# Patient Record
Sex: Male | Born: 1940 | Race: White | Hispanic: No | Smoking: Former smoker
Health system: Southern US, Community
[De-identification: ages and names within clinical notes are randomized; demographics above are authoritative.]

## PROBLEM LIST (undated history)

## (undated) DIAGNOSIS — N183 Chronic kidney disease, stage 3 unspecified: Secondary | ICD-10-CM

## (undated) DIAGNOSIS — K219 Gastro-esophageal reflux disease without esophagitis: Secondary | ICD-10-CM

## (undated) DIAGNOSIS — C189 Malignant neoplasm of colon, unspecified: Secondary | ICD-10-CM

## (undated) DIAGNOSIS — M1990 Unspecified osteoarthritis, unspecified site: Secondary | ICD-10-CM

## (undated) DIAGNOSIS — K635 Polyp of colon: Secondary | ICD-10-CM

## (undated) DIAGNOSIS — K259 Gastric ulcer, unspecified as acute or chronic, without hemorrhage or perforation: Secondary | ICD-10-CM

## (undated) DIAGNOSIS — I5022 Chronic systolic (congestive) heart failure: Secondary | ICD-10-CM

## (undated) DIAGNOSIS — I4891 Unspecified atrial fibrillation: Secondary | ICD-10-CM

## (undated) DIAGNOSIS — R918 Other nonspecific abnormal finding of lung field: Secondary | ICD-10-CM

## (undated) DIAGNOSIS — I340 Nonrheumatic mitral (valve) insufficiency: Secondary | ICD-10-CM

## (undated) DIAGNOSIS — I2510 Atherosclerotic heart disease of native coronary artery without angina pectoris: Secondary | ICD-10-CM

## (undated) DIAGNOSIS — I255 Ischemic cardiomyopathy: Secondary | ICD-10-CM

## (undated) DIAGNOSIS — J449 Chronic obstructive pulmonary disease, unspecified: Secondary | ICD-10-CM

## (undated) DIAGNOSIS — E039 Hypothyroidism, unspecified: Secondary | ICD-10-CM

## (undated) DIAGNOSIS — I219 Acute myocardial infarction, unspecified: Secondary | ICD-10-CM

## (undated) DIAGNOSIS — F419 Anxiety disorder, unspecified: Secondary | ICD-10-CM

## (undated) DIAGNOSIS — L409 Psoriasis, unspecified: Secondary | ICD-10-CM

## (undated) DIAGNOSIS — I350 Nonrheumatic aortic (valve) stenosis: Secondary | ICD-10-CM

## (undated) DIAGNOSIS — D649 Anemia, unspecified: Secondary | ICD-10-CM

## (undated) HISTORY — DX: Chronic systolic (congestive) heart failure: I50.22

## (undated) HISTORY — DX: Malignant neoplasm of colon, unspecified: C18.9

## (undated) HISTORY — DX: Atherosclerotic heart disease of native coronary artery without angina pectoris: I25.10

## (undated) HISTORY — DX: Nonrheumatic mitral (valve) insufficiency: I34.0

## (undated) HISTORY — DX: Chronic kidney disease, stage 3 unspecified: N18.30

## (undated) HISTORY — DX: Chronic obstructive pulmonary disease, unspecified: J44.9

## (undated) HISTORY — DX: Nonrheumatic aortic (valve) stenosis: I35.0

## (undated) HISTORY — DX: Chronic kidney disease, stage 3 (moderate): N18.3

## (undated) HISTORY — DX: Unspecified atrial fibrillation: I48.91

---

## 2002-12-08 ENCOUNTER — Inpatient Hospital Stay (HOSPITAL_COMMUNITY): Admission: EM | Admit: 2002-12-08 | Discharge: 2002-12-20 | Payer: Self-pay | Admitting: Emergency Medicine

## 2002-12-08 ENCOUNTER — Encounter: Payer: Self-pay | Admitting: Emergency Medicine

## 2002-12-10 ENCOUNTER — Encounter: Payer: Self-pay | Admitting: *Deleted

## 2002-12-11 ENCOUNTER — Encounter: Payer: Self-pay | Admitting: *Deleted

## 2002-12-11 HISTORY — PX: CORONARY ARTERY BYPASS GRAFT: SHX141

## 2002-12-12 ENCOUNTER — Encounter: Payer: Self-pay | Admitting: Cardiothoracic Surgery

## 2002-12-12 ENCOUNTER — Encounter (INDEPENDENT_AMBULATORY_CARE_PROVIDER_SITE_OTHER): Payer: Self-pay | Admitting: Cardiology

## 2002-12-13 ENCOUNTER — Encounter: Payer: Self-pay | Admitting: Cardiothoracic Surgery

## 2002-12-14 ENCOUNTER — Encounter: Payer: Self-pay | Admitting: Cardiothoracic Surgery

## 2002-12-15 ENCOUNTER — Encounter: Payer: Self-pay | Admitting: Cardiothoracic Surgery

## 2002-12-18 ENCOUNTER — Encounter: Payer: Self-pay | Admitting: Cardiothoracic Surgery

## 2003-01-12 ENCOUNTER — Encounter: Payer: Self-pay | Admitting: Cardiothoracic Surgery

## 2003-01-12 ENCOUNTER — Encounter: Admission: RE | Admit: 2003-01-12 | Discharge: 2003-01-12 | Payer: Self-pay | Admitting: Cardiothoracic Surgery

## 2005-02-26 ENCOUNTER — Ambulatory Visit (HOSPITAL_COMMUNITY): Admission: RE | Admit: 2005-02-26 | Discharge: 2005-02-26 | Payer: Self-pay | Admitting: Family Medicine

## 2005-12-10 DIAGNOSIS — K259 Gastric ulcer, unspecified as acute or chronic, without hemorrhage or perforation: Secondary | ICD-10-CM

## 2005-12-10 DIAGNOSIS — K635 Polyp of colon: Secondary | ICD-10-CM

## 2005-12-10 HISTORY — DX: Gastric ulcer, unspecified as acute or chronic, without hemorrhage or perforation: K25.9

## 2005-12-10 HISTORY — DX: Polyp of colon: K63.5

## 2005-12-15 ENCOUNTER — Inpatient Hospital Stay (HOSPITAL_COMMUNITY): Admission: AD | Admit: 2005-12-15 | Discharge: 2005-12-24 | Payer: Self-pay | Admitting: Family Medicine

## 2005-12-23 ENCOUNTER — Ambulatory Visit: Payer: Self-pay | Admitting: Internal Medicine

## 2005-12-23 ENCOUNTER — Encounter (INDEPENDENT_AMBULATORY_CARE_PROVIDER_SITE_OTHER): Payer: Self-pay | Admitting: *Deleted

## 2006-04-13 ENCOUNTER — Ambulatory Visit (HOSPITAL_COMMUNITY): Admission: RE | Admit: 2006-04-13 | Discharge: 2006-04-13 | Payer: Self-pay | Admitting: *Deleted

## 2008-02-10 ENCOUNTER — Emergency Department (HOSPITAL_COMMUNITY): Admission: EM | Admit: 2008-02-10 | Discharge: 2008-02-10 | Payer: Self-pay | Admitting: Emergency Medicine

## 2010-11-02 ENCOUNTER — Encounter: Payer: Self-pay | Admitting: Family Medicine

## 2011-02-27 NOTE — Procedures (Signed)
Cory Ryan, Cory Ryan                 ACCOUNT NO.:  [id number]   MEDICAL RECORD NO.:  [id number]          PATIENT TYPE:  INP   LOCATION:  A203                          FACILITY:  [facility]   PHYSICIAN:  Dani Gobble, MD       DATE OF BIRTH:  Mar 10, 1941   DATE OF PROCEDURE:  12/16/2005  DATE OF DISCHARGE:                                  ECHOCARDIOGRAM   REFERRING:  Dr. Regino Schultze and Dr. Domingo Sep.   MEDICATIONS:  A 70 year old gentleman with a past medical history of known  CAD status post CABG who was admitted with possible CHF.   Technical quality of the study is adequate.   The aorta measures normally at 2.8 cm.   Left atrium is moderate to markedly dilated, measured at 4.8 cm.   The interventricular septum is thickened, measured at 1.7 cm, as is the  posterior wall at 1.3 cm.   The aortic valve is mild to moderately calcified with decreased leaflet  excursion which is likely secondary to decreased cardiac output. Peak  velocity across the aortic valve is 1.4 meters per second corresponding to a  peak gradient of 7 mmHg and a mean gradient of 5 mmHg with an area by  Doppler of 2 cm2. At times, there appears to be trivial aortic  insufficiency.   The mitral valve is notable for mild mitral annular calcification. No mitral  valve prolapse is noted. There is at least a moderate eccentric jet of  mitral regurgitation directed posteriorly. I cannot exclude the possibility  that it is more severe, although no obvious pulmonary vein flow reversal was  noted.   The pulmonic valve appeared to be grossly structurally normal.   Tricuspid valve appeared grossly structurally normal with moderate to severe  eccentric jet of TR directed towards the inner atrial septum. Estimated RVSP  is approximately 50 mmHg.   The left ventricle is normal in size with LVIDD measured at 5.0 cm and the  LVISD measured at 4.4 cm. Overall left systolic function is diminished with  an estimated ejection fraction  of 25 to 30%. There appears to be severe  hypokinesis of anteroseptal wall with otherwise moderate global hypokinesis.   The right ventricle is at least moderately dilated with mild to moderate  decrease in right ventricular systolic function. The right atrium is also  dilated.   IMPRESSION:  1.  Biatrial enlargement.  2.  Concentric left ventricular hypertrophy with the septum greater than the      posterior wall.  3.  Mild to moderate aortic sclerosis.  4.  Mild mitral annular calcification.  5.  At least a moderate jet of eccentrically directed mitral regurgitation      towards the posterior wall, and it may be more severe although I did not      appreciate pulmonary vein flow reversal.  6.  Moderate to severe tricuspid regurgitation which is directed      eccentrically towards the inner atrial septum.  7.  Moderate pulmonary hypertension.  8.  Normal left ventricular size with severely decreased ejection fraction  estimated at 25 to 30% with regional wall motion abnormalities as      outlined above.  9.  Right ventricular enlargement with mild to moderate decrease in right      ventricular systolic function.           ______________________________  Dani Gobble, MD     AB/MEDQ  D:  12/16/2005  T:  12/17/2005  Job:  [id number]

## 2011-02-27 NOTE — H&P (Signed)
NAME:  Cory Ryan, Cory Ryan NO.:  [id number]   MEDICAL RECORD NO.:  [id number]                   PATIENT TYPE:  EMS   LOCATION:  ED                                   FACILITY:  [facility]   PHYSICIAN:  Kirk Ruths, M.D.            DATE OF BIRTH:  [birth year]   DATE OF ADMISSION:  12/08/2002  DATE OF DISCHARGE:                                HISTORY & PHYSICAL   CHIEF COMPLAINT:  Short of breath.   HISTORY OF PRESENT ILLNESS:  This is a 70 year old white male who was seen  in the office, for the first time, 2 days before this admission with COPD  with exacerbation of asthmatic bronchitis.  At that time he was treated with  Augmentin, prednisone nebulizers.  The patient continued to have shortness  of breath and now presents to the emergency room.  He was found to have new  onset of atrial fibrillation with some pulmonary edema exacerbating his  COPD.  In the emergency room he was found to have tachycardia, approximately  150.  He was given 20 of Cardizem with improvement to 120.  The patient also  was noted, on chest x-ray, to have pulmonary edema.  Heart not significantly  enlarged.  She is admitted for:  (1) New onset of atrial fibrillation, (2)  Pulmonary edema probably secondary to #1, (3) Exacerbation of COPD.   ALLERGIES:  He is allergic to no medications.   PAST MEDICAL HISTORY:  Denies any previous illnesses, although he says that  he had asthma as a child.  He is a longtime smoker.  He is on no regular  medications.   REVIEW OF SYSTEMS:  Denies chest pain, nausea or vomiting.   PHYSICAL EXAMINATION:  GENERAL:  In general an elderly white male with mild  shortness of breath.  VITAL SIGNS:  His blood pressure is 160/80.  His pulse is 110 and regular.  Respirations are 20 and unlabored.  He is afebrile.  HEENT:  TMs are normal.  Pupils are equal and reactive to light and  accommodation.  Oropharynx is benign.  NECK:  Neck is supple without JVD,  bruits, or thyromegaly.  LUNGS:  Diffuse expiratory wheezes.  CARDIOVASCULAR:  Heart with a regular sinus rhythm.  ABDOMEN:  Soft and nontender.  EXTREMITIES:  Without clubbing, cyanosis, or edema.  NEUROLOGIC:  Examination is grossly intact.   ASSESSMENT:  1. New onset atrial fibrillation.  2. Pulmonary edema secondary to  #1.  3. Exacerbation of chronic obstructive pulmonary disease.                                              Kirk Ruths, M.D.   WMM/MEDQ  D:  12/08/2002  T:  12/08/2002  Job:  [id number]

## 2011-02-27 NOTE — Consult Note (Signed)
Cory Ryan, Cory Ryan                 ACCOUNT NO.:  [id number]   MEDICAL RECORD NO.:  [id number]          PATIENT TYPE:  INP   LOCATION:  A203                          FACILITY:  [facility]   PHYSICIAN:  R. Roetta Sessions, M.D. DATE OF BIRTH:  [birth year]   DATE OF CONSULTATION:  12/21/2005  DATE OF DISCHARGE:                                   CONSULTATION   REQUESTING PHYSICIAN:  Kirk Ruths, M.D.   REASON FOR CONSULTATION:  Hemoccult-positive stool.   HISTORY OF PRESENT ILLNESS:  Cory Ryan is a 70 year old Caucasian male who  about 2 weeks ago developed low abdominal and scrotal edema. He denied any  chest pain or shortness of breath.  He was admitted and has been evaluated  by cardiology for congestive heart failure.  He is being seen by Dr.  Domingo Sep.  He has been placed on Lovenox He is found to have Hemoccult-  positive stools.  He has never had a colonoscopy.  He has a family history  for colon cancer in a brother diagnosed and deceased in his 66s.  He  generally has daily soft brown bowel movements between 1 and 2 a day.  Denies rectal bleeding or melena.  Denies any nausea, vomiting, abdominal  pain, heart burn, indigestion, dysphagia, or odynophagia.  Denies any early  satiety or anorexia.  Denies any constipation or diarrhea.  He denies any  history of jaundice.  Denies any history of known liver disease.  Denies any  tattoos, or blood transfusions. He has had hepatitis A, B, and C markers  which have been negative.  He had an abdominal pelvic CT which showed a  regular contour of the liver, ascites, mesenteric edema, cholelithiasis,  diverticulosis, scattered mesenteric, retroperitoneal, and gastrohepatic  ligament lymph nodes, pelvic ascites, atherosclerotic disease of the iliac  and femorals, diffuse body wall edema to the lower pelvis, pubic area and  extremities.   PAST MEDICAL HISTORY:  1.  Significant for coronary artery disease, status post CABG.  2.  Congestive  heart failure.  3.  History of chronic atrial fibrillation.  4.  Left ventricular hypertrophy with a decreased ejection fraction of 25-      30%.  5.  He has cardiomegaly, mitral regurgitation.  6.  Pulmonary hypertension.  7.  He has hypertension.  8.  Hyperlipidemia.  9.  AAA.  10. COPD.  11. Right arm surgery.   MEDICATIONS PRIOR TO ADMISSION:  1.  Potassium chloride 20 mEq daily.  2.  Aspirin 81 mg daily.  3.  Nebulizers p.r.n.  4.  Proventil p.r.n.  5.  He was supposed to be on metoprolol, Plavix, Lipitor, and Lasix;      although he was not taking these prior to admission.   ALLERGIES:  COUMADIN which causes rash and petechia.   FAMILY HISTORY:  Positive for a brother who died of lung cancer at age 70.  Mother age 51 deceased secondary to an MI.  Father deceased at age 11  secondary to AAA.  He has multiple siblings with history significant for  coronary artery disease.  SOCIAL HISTORY:  Mr. Goerner has been married for 43 years.  He has 2 grown  healthy children.  He is a retired Geophysicist/field seismologist. He has a 30-pack-year  history of tobacco use, quitting in 2003.  He denies any alcohol or drug  use.   REVIEW OF SYSTEMS:  CONSTITUTIONAL:  Weight is stable.  Denies any anorexia.  He does complain of some fatigue.  CARDIOVASCULAR:  Denies any chest pain or  palpitations.  PULMONARY:  He does have some dyspnea on exertion and  complains of chronic nonproductive cough.  No hemoptysis.  GASTROINTESTINAL:  See HPI.  GENITOURINARY:  Denies any dysuria, hematuria, increased urinary  frequency.   PHYSICAL EXAMINATION:  VITAL SIGNS:  Weight 185.3 pounds down from 188.3,  yesterday.  Temperature 96.9, pulse 77, respirations 20, blood pressure  136/69.  O2 saturation 96% on room air.  GENERAL:  Cory Ryan is a 70 year old Caucasian male who is alert, oriented,  pleasant and cooperative in no acute distress.  HEENT:  Sclerae are clear.  Nonicteric.  Conjunctivae pink. Oropharynx pink   and moist without any lesions.  NECK:  Supple without any masses or thyromegaly.  CHEST:  Heart rate irregularly irregular.  Lungs with decreased breath  sounds bilaterally.  ABDOMEN:  Protuberant and bulging.  He does have positive fluid wave with  shifting dullness.  He has gross suprapubic scrotal and penile edema.  Abdomen is soft, nontender, without palpable hepatosplenomegaly or mass  although the exam is limited given the patient's body habitus.  SKIN:  Multiple ecchymotic and purpuric areas.  EXTREMITIES:  With 1+ bilateral lower extremity edema.   LABORATORY STUDIES:  WBC 6.1, hemoglobin 11, hematocrit 33.1, platelets 123.  __________ 1.4.  alkaline phosphatase 121, AST 22, ALT 16, total protein  6.1, albumin 2.9, magnesium 2.3  PT 16, INR 1.3, PTT 45, calcium 9.1, sodium  135, potassium 4, chloride 97, CO2 38, BUN 40, creatinine 1.9,  glucose 95.  Iron 22, TIBC 389, % saturation 6, UIBC 367, B12 552, folate 9.8, and  ferritin 65.  BNP is 585.  Urinalysis negative.  PSA 0.44, prealbumin 102,  hepatitis A, B, and C markers negative.   IMPRESSION:  Cory Ryan is a 70 year old Caucasian male with history of  chronic atrial fibrillation, congestive heart failure, and decreased  ejection fraction.  He has been placed on Lovenox since hospitalized. He is  found to have Hemoccult-positive stool.  He has severe abdominal and pelvic  ascites as well as scrotal and penile edema.  He is found to have an  irregular contour to his liver on recent CT.  He has a family history of  colorectal carcinoma.  He is going to need further evaluation with  colonoscopy to rule out occult malignancy given the above findings. I  suspect he could have congestive hepatopathy.  He could also have occult  liver disease as well.  Viral markers are negative.   PLAN:  1.  We will have Dr. Jena Gauss review the CT.  2.  We will schedule a colonoscopy and EGD in the near future with Dr.     Jena Gauss.  I have discussed  this procedure including the risks and benefits      including bleeding, infection, perforation, drug reaction; he agrees and      consent will be obtained.  3.  He will need SB prophylaxis.  He will also need his Lovenox held for at      least 12 hours prior  to the procedure.  4.  Further recommendations to follow.   We would like to thank Dr. Regino Schultze for allowing [place] to participate in the  care of Mr. Muzyka.      Nicholas Lose, N.P.      Jonathon Bellows, M.D.  Electronically Signed    KC/MEDQ  D:  12/21/2005  T:  12/21/2005  Job:  [id number]   cc:   Kirk Ruths, M.D.  Fax: [phone]

## 2011-02-27 NOTE — Consult Note (Signed)
NAME:  Cory Ryan, HOMES NO.:  [id number]   MEDICAL RECORD NO.:  [id number]                   PATIENT TYPE:  INP   LOCATION:                                       FACILITY:  [facility]   PHYSICIAN:  Mikey Bussing, M.D.           DATE OF BIRTH:  11/23/1940   DATE OF CONSULTATION:  12/11/2002  DATE OF DISCHARGE:                                   CONSULTATION   REASON FOR CONSULTATION:  Severe 3-vessel coronary artery disease with non-Q-  wave myocardial infarction.   CHIEF COMPLAINT:  Chest pain.   HISTORY OF PRESENT ILLNESS:  I was asked to evaluate this 70 year old white  male smoker for potential surgical coronary revascularization for recently  diagnosed severe 3-vessel coronary artery disease.  The patient presented to  [facility] three days ago with chest tightness and acute shortness of  breath.  He was found to have pulmonary edema and was ruled in for MI with a  positive CPK-MB of 30 nanogram/ml.  He was placed on heparin and  nitroglycerin and transferred to [facility].  He was also found to be in  atrial fibrillation which converted to sinus with Cardizem.  Cardiac  catheterization performed today by Cristy Hilts. Jacinto Halim, M.D., demonstrates a 99%  stenosis of circumflex marginal, 90% stenosis of the proximal right and 90%  stenosis of the proximal LAD.  His ejection fraction is 45% and left  ventricular and diastolic pressures 15 mHg.  There is inferior wall  akinesia.  His left renal artery has a 90% stenosis.  The patient is felt to  be a candidate for surgical coronary revascularization based on his symptoms  of bad 3-vessel coronary anatomy and postinfarction unstable angina  symptoms.   PAST MEDICAL HISTORY:  1. Active smoker.  2. Psoriasis.  3. Status post right arm surgery.  4. Recent episode of bronchitis, treated by his local physician with a     prednisone taper and Zithromax.   HOME MEDICATIONS:  Vitamins only.   ALLERGIES:   None.   SOCIAL HISTORY:  The patient is retired.  He lives with his wife and smokes  one pack of cigarettes a day.  He denies alcohol intake.   FAMILY HISTORY:  Negative for diabetes, negative for premature coronary  disease.   REVIEW OF SYSTEMS:  CONSTITUTIONAL:  Negative constitutional symptoms of  fever or weight loss.  PULMONARY:  Positive pulmonary symptoms of shortness  of breath but without productive cough.  HEENT:  Negative for change in  vision, difficulty swallowing or headache.  CARDIAC:  Positive for angina  and non-Q-wave MI.  GI:  Negative for jaundice, hepatitis or blood per  rectum.  UROLOGIC:  Negative for hematuria, prostate cancer or kidney  stones.  Negative for DVT, claudication, TIA.  HEMATOLOGIC:  Negative for  previous blood transfusion or bleeding disorder.  NEUROLOGIC:  Negative  for  seizure, stroke.  ENDOCRINE:  Negative for thyroid disease or diabetes.   PHYSICAL EXAMINATION:  GENERAL:  He is 5 foot, 8 inches and weighs 175  pounds.  VITAL SIGNS:  Blood pressure 152/88, heart rate 90, temperature 97.1.  GENERAL APPEARANCE:  That of a pleasant middle-aged white male in his  hospital room following cardiac catheterization and in no distress.  He is  on intravenous Integrelin and heparin.  HEENT:  Normocephalic, poor dentition, pharynx clear.  NECK:  Without JVD, thyromegaly, mass or bruit.  LUNGS:  Scattered wheezes.  There is no thoracic deformity.  CARDIAC:  Regular rhythm.  He has no gallop or murmur.  ABDOMEN:  Soft, nontender, without organomegaly.  EXTREMITIES:  No clubbing, cyanosis or edema.  He has 2+ radial and 2+ pedal  pulses.  He has a compression dressing in the right groin at the site of his  cardiac catheterization.  RECTAL:  Examination deferred.  NEUROLOGIC:  Alert and oriented x3 with full motor function.  LYMPHATICS:  Revealed no palpable cervical or supraclavicular adenopathy.   LABORATORY DATA:  I reviewed his cardiac  catheterization with Dr. Jacinto Halim.  He  has severe 3-vessel disease with adequate targets for grafting and  moderately reduced LV function with inferior wall hypokinesia.  His  creatinine today was 1.6 and a followup creatinine will be performed in the  morning prior to scheduled surgery.   PLAN:  The patient will benefit from surgical coronary revascularization  with bypass grafts to his LAD, circumflex marginal, right coronary artery,  possibly the diagonal.  I reviewed the indications and expected benefits of  bypass surgery with the patient and he understands those indications as well  as the alternatives to surgery.  I discussed the risks associated with the  surgery and he understands that he would be at risk for an MI, bleeding,  infection or stroke or death with the surgery, but the alternative therapies  would have even higher risks of myocardial infarction or death.   We will schedule his surgery for second case on Tuesday, 3/2.   Thank you very much for this consultation.                                                Mikey Bussing, M.D.    PV/MEDQ  D:  12/11/2002  T:  12/11/2002  Job:  [id number]   cc:   CVTS office   Awanda Mink, M.D.  [place]   [facility]   [place] R. Jacinto Halim, M.D.  [address]  [place]  [place] [address]  Fax: [phone]

## 2011-02-27 NOTE — Op Note (Signed)
NAME:  ALVA, BROXSON                           ACCOUNT NO.:  [id number]   MEDICAL RECORD NO.:  [id number]                   PATIENT TYPE:  INP   LOCATION:  2314                                 FACILITY:  [facility]   PHYSICIAN:  Kerin Perna III, M.D.           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   PROCEDURE:  Coronary artery bypass grafting x4 (left internal mammary artery  to left anterior descending coronary artery, saphenous vein graft to  diagonal, saphenous vein graft to obtuse marginal 2, saphenous vein graft to  right coronary artery).   SURGEON:  Kerin Perna, M.D.   ASSISTANTS:  Coral Ceo, P.A.-C., and Loura Pardon, P.A.-C.   ANESTHESIA:  General by Guadalupe Maple, M.D.   INDICATIONS:  The patient is a 70 year old male who presented with acute-  onset chest pain and ruled in for MI with positive enzymes.  Cardiac  catheterization demonstrated severe three-vessel disease, and he was felt to  be a candidate for surgical coronary revascularization.  Prior to surgery I  examined the patient in his hospital room and reviewed the results of  cardiac catheterization with the patient and his family.  I discussed the  major aspects of the procedure, the alternatives to surgery, the expected  postoperative recovery, and the associated risks of MI, CVA, bleeding, blood  transfusion requirement, infection, and death.  He understood these  implications for the surgery and agreed to proceed with the operation as  planned under what I felt was an informed consent.   OPERATIVE FINDINGS:  The patient's myocardium appeared to be  without  evidence of focal scarring or fibrosis.  The saphenous vein was harvested  from the right leg using endoscopic vein technique.  The mammary artery was  used and was a good vessel with excellent flow.  The patient regained sinus  rhythm after releasing the crossclamp.   DESCRIPTION OF PROCEDURE:  The patient was brought to the operating room and  placed supine on the operating room table, where general anesthesia was  induced under invasive hemodynamic monitoring.  The chest, abdomen, and legs  were prepped with Betadine and draped as a sterile field.  A sternal  incision was made as the saphenous vein was harvested from the right leg.  The internal mammary artery was harvested as a pedicle graft from its origin  at the subclavian vessels.  Heparin was administered and the ACT was  documented as being therapeutic.  Through pursestrings placed in the  ascending aorta and right atrium, the patient was cannulated and placed on  bypass.  The coronaries were  identified for grafting and the mammary artery  and vein grafts were prepared for the distal anastomoses.  A cardioplegia  cannula was placed and the patient was cooled to 30 degrees.  As the aortic  crossclamp was applied, 500 mL of cold blood cardioplegia was delivered to  the aortic root with good cardioplegic arrest and septal temperature  dropping to less than 14 degrees.  Topical iced saline slush was used to  augment myocardial preservation, and a pericardial insulator pad was used to  protect the left phrenic nerve.   The distal coronary anastomoses were then performed.  The first distal  anastomosis was to the distal right.  This was a 1.5 mm vessel with diffuse  sclerosis.  A reversed saphenous vein was sewn end-to-side with running 7-0  Prolene with good flow through the graft.  The second distal anastomosis was  to the OM-2.  This was a 1.5 mm vessel with proximal 95% stenosis.  A  reversed saphenous vein was sewn  end-to-side with a running 7-0 Prolene and  good flow through the graft.  Cardioplegia was redosed.  The third distal  anastomosis was to the first diagonal.  This was a 1.5 mm vessel with  proximal 90% stenosis.  A reversed saphenous vein was sewn end-to-side with  running 7-0 Prolene.  There was good flow through the graft.  Cardioplegia  was redosed.  The fourth distal anastomosis was to the mid-LAD, which was a  1.5 mm vessel.  The left internal mammary artery pedicle was brought through  an opening created in the left lateral pericardium and was brought down onto  the LAD and sewn end-to-side with a running 8-0 Prolene.  There was good  flow through the anastomosis with immediate rise in septal temperature after  release of the pedicle clamp on the mammary artery.  The mammary pedicle was  secured to the epicardium and the aortic crossclamp was removed.   The heart resumed a spontaneous rhythm.  Three proximal vein anastomoses  were placed on the ascending aorta using a 4 mm punch and running 6-0  Prolene.  The partial clamp was removed and the vein grafts were perfused.  Each had good flow, and hemostasis was documented at the proximal and distal  anastomoses.  The patient was rewarmed to 37 degrees.  Temporary pacing  wires were applied.  The lungs were re-expanded and the ventilator was  resumed.  The patient was weaned from bypass without difficulty.  Cardiac  output and blood pressure were stable.  Protamine was administered.  There  was no adverse reaction to the protamine.  The cannulas were removed.  The  mediastinum was irrigated with warm antibiotic irrigation.  The leg incision  was closed in a standard fashion.  The pericardial fat was closed.  Two  mediastinal and a left pleural chest tube were placed and brought out  through separate incisions.  The sternum was reapproximated with interrupted steel wire.  The pectoralis fascia was closed with a running #1 Vicryl.  The   subcutaneous and skin were closed with a running Vicryl.  Sterile dressings  were applied.  Total bypass time was 100 minutes with aortic crossclamp time  of 50 minutes.                                               Theron Arista  Burgess Estelle, M.D.    PV/MEDQ  D:  12/12/2002  T:  12/13/2002  Job:  [id number]   cc:   Cristy Hilts. Jacinto Halim, M.D.  [address]  [place]  [place] [address]  Fax: [phone]

## 2011-02-27 NOTE — Procedures (Signed)
   NAME:  DACE, DENN NO.:  [id number]   MEDICAL RECORD NO.:  [id number]                   PATIENT TYPE:   LOCATION:                                       FACILITY:   PHYSICIAN:  Edward L. Juanetta Gosling, M.D.             DATE OF BIRTH:   DATE OF PROCEDURE:  DATE OF DISCHARGE:                                EKG INTERPRETATION   TIME AND DATE:  1037 on 12/08/2002   INTERPRETATION:  The rhythm is sinus rhythm with a rate in the 90s.  There  are Q waves inferiorly which could be do to previous inferior infarction and  clinical correlation is suggested.  There is probably left ventricular  hypertrophy with strain pattern.  There appears to be left atrial  enlargement.   IMPRESSION:  Abnormal electrocardiogram.                                               Edward L. Juanetta Gosling, M.D.    ELH/MEDQ  D:  12/25/2002  T:  12/26/2002  Job:  [id number]

## 2011-02-27 NOTE — Procedures (Signed)
   NAME:  Cory Ryan, Cory Ryan                           ACCOUNT NO.:  [id number]   MEDICAL RECORD NO.:  [id number]                   PATIENT TYPE:  INP   LOCATION:  A207                                 FACILITY:  [facility]   PHYSICIAN:  Edward L. Juanetta Gosling, M.D.             DATE OF BIRTH:  1941-01-01   DATE OF PROCEDURE:  12/08/2002  DATE OF DISCHARGE:                                EKG INTERPRETATION   TIME/DATE:  1610, December 08, 2002.   DESCRIPTION OF PROCEDURE:  The rhythm is atrial fibrillation with a  ventricular response about 115.  There is left ventricular hypertrophy.  There are ST and T-wave abnormalities that could be due to the LVH or maybe  from other cause.   IMPRESSION:  Abnormal electrocardiogram.                                               Edward L. Juanetta Gosling, M.D.    ELH/MEDQ  D:  12/08/2002  T:  12/08/2002  Job:  [id number]

## 2011-02-27 NOTE — Op Note (Signed)
NAMELEMARIO, CHAIKIN                 ACCOUNT NO.:  [id number]   MEDICAL RECORD NO.:  [id number]          PATIENT TYPE:  INP   LOCATION:  A203                          FACILITY:  [facility]   PHYSICIAN:  R. Roetta Sessions, M.D. DATE OF BIRTH:  1941-09-25   DATE OF PROCEDURE:  12/23/2005  DATE OF DISCHARGE:                                 OPERATIVE REPORT   PROCEDURE:  Esophagogastroduodenoscopy followed by colonoscopy with snare  polypectomy.   ENDOSCOPIST:  Gerrit Friends. Rourk, M.D.   INDICATIONS FOR PROCEDURE:  The patient is a 70 year old gentleman admitted  to the hospital with heme-positive stools with anemia.  He has never had his  lower GI tract imaged. There is a question of a cirrhotic appearing liver on  CT scan.  EGD and colonoscopy are not being done.  This approach has been  discussed with the patient at length.  The potential risks, benefits, and  alternatives have been reviewed; and questions answered.  He is agreeable.  Please see the documentation in the medical record. Lovenox was stopped  yesterday.  SB prophylaxis was given in the way of ampicillin 2 gm IV,  gentamicin 125 mg IV prior to the procedure.   CONSCIOUS SEDATION:  Versed 4 mg IV, Demerol 75 mg IV in divided doses.   INSTRUMENT:  Olympus video chip system.   FINDINGS:  Examination of the tubular esophagus revealed 4-corner distal  esophageal erosions; otherwise the esophageal mucosa appeared normal.  There  were no varices.  The EG junction was easily traversed.   STOMACH:  The gastric cavity was empty.  It insufflated well with air.  A  thorough examination of the gastric mucosa including a retroflex view of the  proximal stomach and esophagogastric junction demonstrated multiple antral  erosions, no infiltrating process.  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  Pylorus was patent and easily traversed.  Examination of the bulb  and second portion revealed no abnormalities.   THERAPEUTIC/DIAGNOSTIC  MANEUVERS:  None.   The patient tolerated the procedure well and was prepared for colonoscopy.  A digital rectal exam revealed no abnormalities.   ENDOSCOPIC FINDINGS:  The prep was marginal, and inadequate to review the  finer mucosal detail of the colon.   RECTUM:  Examination of the rectal mucosa including the retroflex view of  the anal verge revealed a 5-mm polyp 5 cm from the anal verge. Otherwise the  rectal mucosa appeared normal.   COLON:  The colonic mucosa was surveyed from the rectosigmoid junction  through the left transverse and right colon to the area of the appendiceal  orifice, ileocecal valve, and cecum.  These structures were well seen and  photographed for the record.   From this level the scope was slowly and withdrawn.  All previously  mentioned mucosal surfaces were again seen.  There was quite a bit of  granular liquid stool throughout the transverse and right colon which made  the exam more difficult.  The patient was noted to have left-sided  diverticula.  There was a 5-mm polyp at 35 cm adjacent  to a large, angry,  1.25 cm polyp on a long stalk.  Please see photos.  These 2 polyps were  resected with snare cautery and recovered through the scope.  A survey of  the remaining colonic mucosa revealed no gross abnormalities although a  small polyp or other lesion may have been obscured by the poor prep.   The patient tolerated both procedures well and was reacted in endoscopy.   EGD IMPRESSION:  1.  Distal esophageal erosions consistent with erosive reflux esophagitis.  2.  Antral erosions, otherwise normal stomach.  3.  Normal D1 and D2.   COLONOSCOPY FINDINGS:  1.  Diminutive rectal polyp.  2.  Left-sided diverticula.  3.  Polyps at 35 cm.  One large, angry and could easily explain a Hemoccult      positive stool.  All polyps resected with snare cautery, considered the      colonoscopy somewhat inadequate due to a poor prep.   RECOMMENDATIONS:  1.   Resume Lovenox tomorrow if needed.  2.  Add a PPI for gastroesophageal reflux disease.  3.  Check H. pylori serologies.  4.  No aspirin or arthritis medications for 10 days.  5.  He will in all likelihood need an early followup colonoscopy, i.e. 3      months from now, depending on path. Will advance his diet.  He could be      discharged in the next 24 hours from a GI standpoint.      Jonathon Bellows, M.D.  Electronically Signed     RMR/MEDQ  D:  12/23/2005  T:  12/25/2005  Job:  [id number]   cc:   Dani Gobble, MD  Fax: [phone]   Kirk Ruths, M.D.  Fax: [phone]

## 2011-02-27 NOTE — H&P (Signed)
NAMETALAN, GILDNER NO.:  [id number]   MEDICAL RECORD NO.:  [id number]          PATIENT TYPE:  INP   LOCATION:  A203                          FACILITY:  [facility]   PHYSICIAN:  Kirk Ruths, M.D.DATE OF BIRTH:  1940/10/21   DATE OF ADMISSION:  12/15/2005  DATE OF DISCHARGE:  LH                                HISTORY & PHYSICAL   CHIEF COMPLAINT:  Swelling.   HISTORY OF PRESENT ILLNESS:  This is a 70 year old male who has been seen by  Dr. Rito Ehrlich for swelling in his scrotum and felt to have congestive  failure.  In my office the patient had 2+ firm pitting edema with extreme  edema of his scrotum and penis.  The patient was felt to have some  lymphedema and possible recurrent congestive heart failure.  He is admitted  for further evaluation and treatment.   PAST MEDICAL HISTORY:  He has history of coronary artery disease, status  post myocardial infarction.  He is status post coronary artery bypass  grafting.  He has chronic atrial fibrillation. He has hypertension.  He has  chronic obstructive pulmonary disease, chronic renal insufficiency,  hyperlipidemia.   ALLERGIES:  He is allergic to COUMADIN causes severe rash.   MEDICATIONS:  1.  Metoprolol 25 mg daily.  2.  Plavix 75 mg daily.  3.  Lipitor 10 mg daily.   PRIMARY CARE PHYSICIAN:  The patient has been followed by [facility] but I feel he has been lost to follow up for several years now.   REVIEW OF SYSTEMS:  He does admit to some shortness of breath. He denies  chest pain, nausea, vomiting, diarrhea.   PHYSICAL EXAMINATION:  GENERAL APPEARANCE:  Elderly male who appears older  than his age.  VITAL SIGNS: He is afebrile. The blood pressure is 140/90.  Pulse is 92 and  regular.  Respirations 20 and unlabored.  HEENT:  Tympanic membranes normal.  Pupils equal, round, reactive to light  and accommodation.  Oropharynx is benign.  NECK:  Supple without jugular venous distention,  bruits or thyromegaly.  LUNGS:  With some mild moist rales in the bases.  HEART:  With regular sinus rhythm without murmurs, rubs or gallops.  ABDOMEN:  Slightly distended, tympanitic.  EXTREMITIES:  He does have 2 to 3+ pitting edema of his lower extremities  with extreme edema of his penis and scrotum.  No clubbing or cyanosis.   ASSESSMENT:  1.  Lymphedema.  2.  Congestive heart failure.  3.  Coronary artery disease.  4.  ALLERGY TO COUMADIN.  5.  History of chronic atrial fibrillation.      Kirk Ruths, M.D.  Electronically Signed     WMM/MEDQ  D:  12/15/2005  T:  12/15/2005  Job:  [id number]

## 2011-02-27 NOTE — Discharge Summary (Signed)
NAMEUSMAN, Cory Ryan NO.:  [id number]   MEDICAL RECORD NO.:  [id number]          PATIENT TYPE:  INP   LOCATION:  A203                          FACILITY:  [facility]   PHYSICIAN:  Kirk Ruths, M.D.DATE OF BIRTH:  June 03, 1941   DATE OF ADMISSION:  12/15/2005  DATE OF DISCHARGE:  03/15/2007LH                                 DISCHARGE SUMMARY   DISCHARGE DIAGNOSES:  1.  Congestive heart failure.  2.  Atrial fibrillation.  3.  Cardiomyopathy.  4.  Hypothyroidism.  5.  Gastrointestinal bleed.  6.  Hypokalemia.  7.  Diverticulosis.  8.  Abnormal CT of the chest with pulmonary nodules, plan follow-up.   HOSPITAL COURSE:  This 70 year old male was admitted with lymphedema,  scrotal swelling and atrial fibrillation and congestive failure.  The  patient was admitted to the floor and cardiac enzymes were obtained and were  negative.  The patient was diuresed with Lasix.  He was seen in consultation  by Dr. Domingo Sep from cardiology.  She helped adjust the patient's  medications.  He underwent an echocardiogram, which showed ejection fraction  of 25-30%.  A CT scan of the chest showed a large right pleural effusion  with multiple pulmonary nodules, which will require follow-up to assess  stability in three to four months.  The patient gets mildly low potassium,  during his stay was replaced.  He was treated with Lovenox throughout his  stay and due to a COUMADIN rash, is unable to tolerate COUMADIN.  The  patient was also placed on heparin.  TSH was shown to be 9.  He was started  on Synthroid 25 mcg daily.  His stool was noted to be heme-positive although  his hemoglobin has been stable throughout his stay in the 11-12 range.  He  underwent EGD and colonoscopy, which showed some mild antral erosions as  well as two polyps, which were snared and benign.  The patient will require  repeat TCS at a later date due to poor prep.  The patient was comfortable at  the time of  discharge, edema was essentially resolved, breathing well.  Heart with atrial fibrillation with a reasonable rate, approximately 70-80.   The patient was discharged home on:  1.  Coreg 3.125 mg twice a day.  2.  Cardizem 300 mg daily.  3.  Lasix 80 mg twice a day.  4.  Spiriva one puff daily.  5.  K-Dur 20 mEq twice a day.  6.  Aldactone 12.5 mg two times a day.  7.  Synthroid 25 mcg daily.  8.  Protonix daily.  9.  Resume home nebulizers.  10. He was also told no aspirin for 10 days.   He will be followed by Dr. Domingo Sep in two to three weeks.  Will be seen in  my office in one week.  The patient is stable at the time of discharge.      Kirk Ruths, M.D.  Electronically Signed     WMM/MEDQ  D:  01/12/2006  T:  01/12/2006  Job:  [id number]

## 2011-02-27 NOTE — Op Note (Signed)
   NAME:  KWANE, ROHL                           ACCOUNT NO.:  [id number]   MEDICAL RECORD NO.:  [id number]                   PATIENT TYPE:  INP   LOCATION:  2314                                 FACILITY:  [facility]   PHYSICIAN:  Kerin Perna III, M.D.           DATE OF BIRTH:  August 02, 1941   DATE OF PROCEDURE:  12/12/2002  DATE OF DISCHARGE:                                 OPERATIVE REPORT   PROCEDURE:  Placement of right femoral arterial line.   SURGEON:  Kerin Perna, M.D.   ANESTHESIA:  General.   INDICATIONS:  Arterial blood pressure monitoring during coronary bypass  surgery.   DESCRIPTION OF PROCEDURE:  The patient had a right femoral arterial line  placed using the Seldinger technique during coronary bypass grafting x4 when  the radial arterial line was dampened and provided inadequate blood pressure  monitoring.  The femoral artery was entered with a guiding needle and a  guidewire was placed through the needle and the needle was withdrawn.  The  femoral soft catheter was placed over the guidewire and secured to the skin.  It was protected with the transducer and provided [place] with adequate  hemodynamic monitoring.                                               Mikey Bussing, M.D.    PV/MEDQ  D:  12/12/2002  T:  12/13/2002  Job:  [id number]

## 2011-02-27 NOTE — Procedures (Signed)
NAME:  Cory Ryan, Cory Ryan NO.:  [id number]   MEDICAL RECORD NO.:  [id number]          PATIENT TYPE:  REC   LOCATION:                                FACILITY:  [facility]   PHYSICIAN:  Dani Gobble, MD       DATE OF BIRTH:  1940/11/26   DATE OF PROCEDURE:  04/13/2006  DATE OF DISCHARGE:                                    STRESS TEST   REFERRING:  1.  Kirk Ruths, M.D.  2.  Dani Gobble, MD.   INDICATION:  Atrial fibrillation.   ELECTROCARDIOGRAPHIC HEMODYNAMIC DATA:  Baseline blood pressure 128/70 with  a pulse of 57 b.p.m.  Baseline 12-lead EKG reveals rate controlled atrial  fibrillation with nonspecific ST changes and/or dig effect.  Persantine was  infused per protocol.  There were no significant changes in the EKG.  He did  have occasional brief pauses with the longest duration approximately 2.2  seconds.  He was asymptomatic throughout the procedure.  Blood pressure and  heart rate remained stable throughout the procedure.   IMPRESSION:  1.  Clinically negative for angina.  2.  Electrocardiogram negative for ischemic changes.  3.  Electrocardiogram notable for rate controlled atrial fibrillation with      occasional brief pauses, none greater than 2.2 seconds and asymptomatic.  4.  Scintigraphic images are pending.           ______________________________  Dani Gobble, MD     AB/MEDQ  D:  04/13/2006  T:  04/13/2006  Job:  [id number]   cc:   Kirk Ruths, M.D.  Fax: [phone]

## 2011-02-27 NOTE — Discharge Summary (Signed)
NAME:  Cory Ryan, Cory Ryan                           ACCOUNT NO.:  [id number]   MEDICAL RECORD NO.:  [id number]                   PATIENT TYPE:  INP   LOCATION:  2041                                 FACILITY:  [facility]   PHYSICIAN:  Kerin Perna, M.D.               DATE OF BIRTH:  07-21-1941   DATE OF ADMISSION:  12/09/2002  DATE OF DISCHARGE:  12/20/2002                                 DISCHARGE SUMMARY   PRIMARY ADMISSION DIAGNOSIS:  Chest pain.   ADDITIONAL/DISCHARGE DIAGNOSES:  1. Severe three-vessel coronary artery disease.  2. Non-Q-wave myocardial infarction.  3. Postoperative atrial fibrillation.  4. History of tobacco abuse.  5. Postoperative bronchitis.  6. History of chronic obstructive pulmonary disease.   PROCEDURE:  1. Cardiac catheterization.  2. Coronary artery bypass graft surgery x4 (left internal mammary artery to     the LAD, saphenous vein graft to the diagonal, saphenous vein graft to     the second obtuse marginal, saphenous vein graft to the right coronary     artery).  3. Endoscopic vein harvest, right thigh to mid-calf.   HISTORY OF PRESENT ILLNESS:  The patient is a 70 year old white male who had  no previous history of coronary artery disease.  Three days prior to this  admission, he presented to [facility] complaining of chest pain and  the acute onset of shortness of breath.  He was noted to be in pulmonary  edema.  He was ruled in for a myocardial infarction.  He was started on  heparin and nitroglycerin, and transferred to [facility] for further evaluation and treatment.   HOSPITAL COURSE:  On admission he was noted to be in atrial fibrillation and  was started on Cardizem which converted into normal sinus rhythm.  He  stabilized and ultimately underwent a cardiac catheterization on December 11, 2002, by Dr. Cristy Hilts. Ganji, which showed a 99% stenosis of the circumflex  marginal and 90% stenosis of the proximal right, and  90% stenosis of the  proximal LAD, with an ejection fraction of 45%.  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  He is not felt to be a good candidate for a  percutaneous intervention, therefore a cardiothoracic surgery consultation  was obtained.  He was seen by Dr. Kathlee Nations Trigt, and it was felt that he  would benefit from surgical revascularization.  After a complete  preoperative workup, he was taken to the operating room on December 12, 2002,  where he underwent a coronary artery bypass graft surgery x4 with the above-  noted grafts.  He tolerated the procedure well and was transferred to the  SICU in stable condition.  He was extubated shortly after surgery.  He was  hemodynamically stable and doing well on postoperative day one.  He was kept  in the intensive  care unit for further observation.  He had mild elevation  of his creatinine postoperatively to 2.0.  He also developed elevation in  his white blood cell count to 24,000.  The last chest x-ray showed some  atelectasis, and he had a productive cough.  He was started empirically on  Ceftin and treated with aggressive pulmonary toilette measures, including a  water valve incentive spirometry and nebulizer treatments.  By postoperative  day three, he was ready for a transfer to the floor.  Postoperatively he  also went back into atrial fibrillation.  He was started on Coumadin as well  as IV amiodarone and digoxin.  Additionally, he was started on Lopressor.  His rate remained controlled, and ultimately he converted to a normal sinus  rhythm.  Overall he has done well postoperatively.  He has been ambulating  in the halls with cardiac rehab phase I and is doing quite well.  His IV  amiodarone and digoxin have been converted to p.o. doses, and he is  tolerating this quite well.  His white blood cell count has trended back  down and has stabilized at 8.8.  His INR is therapeutic at 2.2.  He has been  diuresed back  down to below his preoperative weight.  His cough is improved.  He has been weaned off of supplemental oxygen, maintaining O2 saturations of  greater than 90% on room air.  His creatinine has also trended back downward  and has stabilized at 1.4.  His surgical incision sites are healing well.  He is tolerating a regular diet and is having normal bowel and bladder  function.   DISPOSITION:  It is felt that if he remains stable, he will be ready for  discharge home on December 20, 2002.   DISCHARGE MEDICATIONS:  1. Ceftin 250 mg b.i.d. x3 days.  2. Lanoxin 0.125 mg daily.  3. Amiodarone 200 mg b.i.d.  4. Lopressor 25 mg b.i.d.  5. Tylox one to two q.4h. p.r.n. pain.  6. Coumadin, home dose to be determined by a PT and INR done on the day of     discharge.   DISCHARGE INSTRUCTIONS:  He is to refrain from driving, heavy lifting, or  strenuous activity.  He may continue daily walking and the use of his  incentive spirometer.  He is asked to shower daily and clean his incisions  with soap and water.   DIET:  He will continue with a low-fat, low-sodium diet.   FOLLOW UP:  He will follow up with Dr. Kem Boroughs at the [facility] and Vascular Office on December 29, 2002, at 11:15 a.m.  He  will then follow up with Dr. Donata Clay in the [facility] Office on Friday, December 30, 2002, at 12:30 p.m.  He is asked to have a chest x-ray at [facility] one hour prior to this appointment and to bring his films  to the office for Dr. Donata Clay to review.  He will have a PT and INR drawn  on Friday, December 22, 2002, at the [facility], and these  results will be called to Dr. Domingo Sep, [facility] and Vascular  Office, for further Coumadin dosing.  He is asked to call our office if he  has any problems or questions in the interim.     Coral Ceo, P.A.  Kerin Perna, M.D.    GC/MEDQ  D:  12/19/2002  T:  12/20/2002  Job:   [id number]   cc:   Kem Boroughs, M.D.  [phone]  [place]  [place] [address]  Fax: [phone]   Karleen Hampshire, M.D.

## 2011-02-27 NOTE — Cardiovascular Report (Signed)
NAME:  ROHIT, DELORIA                           ACCOUNT NO.:  [id number]   MEDICAL RECORD NO.:  [id number]                   PATIENT TYPE:  INP   LOCATION:  4735                                 FACILITY:  [facility]   PHYSICIAN:  Cristy Hilts. Jacinto Halim, M.D.                  DATE OF BIRTH:  [birth year]   DATE OF PROCEDURE:  12/11/2002  DATE OF DISCHARGE:                              CARDIAC CATHETERIZATION   PROCEDURE PERFORMED:  1. Left ventriculography.  2. Selective right and left coronary arteriography.  3. Abdominal aortogram and selective right and left renal arteriography.  4. Right innominate and right subclavian arteriography including     visualization of right internal mammary artery, and left subclavian     arteriography including visualization of left internal mammary artery.   INDICATIONS FOR PROCEDURE:  The patient is a 70 year old gentleman with a  history of bronchial asthma in the past that was admitted to [facility] with chest pain and non-Q-wave myocardial infarction.  Due to his  multiple cardiac risk factors and his non-Q-wave myocardial infarction, he  was brought to the cardiac catheterization lab to evaluate his coronary  anatomy.  Abdominal aortogram was performed to evaluate abdominal  arteriosclerosis, and the selective renal arteriography was performed  because of renal insufficiency to evaluate for renal artery stenosis.   HEMODYNAMIC DATA:  1. The left ventricular pressures were 110/8 with an end diastolic pressure     of 15 mmHg.  2. The aortic pressures were 120/81 with a mean of 99 mmHg.  3. There was no pressure gradient across the aortic valve.   ANGIOGRAPHIC DATA:  1. Right coronary artery:  The right coronary artery is a large caliber     vessel.  It is a dominant vessel.  It has proximal high-grade tortuosity     and a 90% stenosis in its proximal to mid segment.  This lesion appears     thrombotic.  The mid vessel also has mild to moderate  tortuosity.  The     proximal tortuosity is a very acute angle and severe and has a shepherd's     hook appearance.  2. Left main coronary artery:  The left main coronary artery is a large     caliber vessel.  It is normal.  3. Left circumflex:  The left circumflex is a large caliber vessel.  It     gives origin to a moderate to large-sized obtuse marginal 1 and continues     as obtuse marginal 2.  The mid segment of this vessel has 99% stenosis.  4. Left anterior descending artery:  The left anterior descending artery is     a large caliber vessel.  It gives origin to a large-sized diagonal 1 and     a large septal perforator.  At the trifurcation point of diagonal 1 and  septal perforator, there is a thrombotic-appearing 90% stenosis.  There     is also mild ectasia at the same region.  5. Left ventriculogram:  Left ventriculography revealed a mild inferior wall     hypokinesis with an ejection fraction of 45%.  6. Right subclavian artery and RIMA:  The left subclavian artery and RIMA     are widely patent.  7. Abdominal aortogram:  The abdominal aortogram revealed no evidence of     abdominal aortic aneurysm.  8. Selective right and left renal arteriography.  The right renal artery has     mild disease in its proximal segment.  The left renal artery has 95% high-     grade ostial stenosis.   IMPRESSION:  1. 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  2. Mild decrease in left ventricular systolic function.  Ejection fraction     45% with inferior hypokinesis.  3. 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   RECOMMENDATIONS:  1. Based on his coronary anatomy, the patient will need coronary artery     bypass grafting which will be the most optimal therapy.  CVTS     consultation has been called for.  2. 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  This will need to be     addressed at a later time.  Because of his very  unstable lesions, the     patient will be continued on medical therapy, and he may need left renal     artery revascularization at a later date.   CONTRAST:  A total of 60 cc of contrast was utilized for coronary  angiography.   TECHNIQUE OF PROCEDURE:  Under usual sterile precautions using 6-French  right femoral arterial access, a 6-French multipurpose B2 catheter was  advanced into the ascending aorta over a 0.035-inch J wire.  The catheter  was gently advanced to the left ventricle, and left ventricular pressures  were monitored.  Hand contrast injection of the left ventricle was performed  in the RAO projection.  The catheter was flushed with saline, pulled back  into the ascending aorta, and pressure gradient across the aortic valve was  monitored.  The right coronary artery was selectively engaged, and  angiography was performed.  In a similar fashion, the left main coronary  artery was selectively engaged, and angiography was performed.  The catheter  was pulled back into the arch of the aorta, and right innominate artery and  left subclavian artery were selectively cannulated respectively and RIMA and  LIMA arteriography was performed.  Then the catheter was pulled back into  the abdominal aorta, and abdominal aortogram and selective right and left  renal arteriography was performed.  Then the catheter was pulled out of the  body in the usual fashion.  The patient tolerated the procedure well.                                               Cristy Hilts. Jacinto Halim, M.D.    Pilar Plate  D:  12/11/2002  T:  12/11/2002  Job:  [id number]   cc:   Kirk Ruths, M.D.  [address]  [place]  [place] [address]  Fax: [phone]   Kem Boroughs, M.D.  [facility] [address]  [place]  [place] [address]  Fax: [phone]

## 2011-02-27 NOTE — Consult Note (Signed)
NAME:  Cory Ryan, Cory Ryan NO.:  [id number]   MEDICAL RECORD NO.:  [id number]                   PATIENT TYPE:  INP   LOCATION:  4735                                 FACILITY:  [facility]   PHYSICIAN:  Danice Goltz, M.D. [facility]            DATE OF BIRTH:  Sep 23, 1941   DATE OF CONSULTATION:  12/10/2002  DATE OF DISCHARGE:                                   CONSULTATION   REASON FOR CONSULTATION:  Dyspnea, evaluation for COPD.   HISTORY OF PRESENT ILLNESS:  The patient is a 70 year old white male,  somewhat challenging historian, who presented to [facility] as a  transfer from [facility] after being noted to have new onset atrial  fibrillation, chest pain, and dyspnea.  The patient had been treated by Dr.  Karleen Hampshire as an outpatient for what appeared to be an exacerbation of  COPD and asthmatic bronchitis.  He was treated with Augmentin, prednisone,  and nebulizers.  He had been treated as an outpatient, but subsequently  developed increasing dyspnea, was noted again to be in atrial fibrillation  and have pulmonary edema.  He ruled in for an MI and therefore was  transferred to [facility] for further evaluation.  He was  transferred on February 27.  He has been treated for his atrial fibrillation  and now his cardiac rate is controlled.  He continued to have difficulties  with dyspnea until his diuretics were increased and his pulmonary edema was  resolved.  He was also noted to have wheezing and rhonchi.  He was treated  for asthmatic bronchitic flare by Dr. Domingo Sep with systemic steroids in a  very short burst, nebulizers and antitussives.  The patient also received  antibiotics in the form of azithromycin.  On my evaluation the patient  states that he is actually much improved.  He is now able to lie flat in bed  which he was not able to do previously.  His chest pain is controlled.  He  has had some difficulties with chest  tightness only when he coughs.  He has  been unable to bring sputum due to its tenacity.  He denies any hemoptysis.  He denies any fevers, chills, or sweats.  He does admit to gastroesophageal  reflux symptoms.  This is an ongoing issue, even with Protonix daily, which  he is receiving now.   Again the patient is a somewhat challenging historian.  He tends to minimize  most symptomatology, and it is difficult to get a complete history from the  patient.   PAST MEDICAL HISTORY:  Essentially unremarkable.  He denies any prior  surgeries with the exception of right arm surgery as a child.  He is not  sure as to why this happened.  It appears to be secondary to a traumatic  event.  The patient has not been hospitalized for any other issues  prior to  this.   ALLERGIES:  The patient has no known medical allergies.   REVIEW OF SYSTEMS:  Is as noted above.   FAMILY HISTORY:  Noncontributory.   SOCIAL HISTORY:  The patient denies any alcohol use.  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  He has a history of tobacco  abuse, smoking a pack of cigarettes per day to amount to over a 50 pack-year  history.   CURRENT MEDICATIONS:  As noted on the MAR.  These were reviewed.   PHYSICAL EXAMINATION:  VITAL SIGNS:  Temperature is 97, blood pressure is  100/80, pulse is 75, oxygen saturation is 95% on room air.  GENERAL:  This is a well-developed, well-nourished, white male who appears  to be in no acute respiratory distress at the time of my examination.  HEENT:  Examination reveals the patient had poor dentition.  He is basically  edentulous.  NECK:  Supple.  No adenopathy noted.  No JVD.  LUNGS:  The patient has a few rhonchi bilaterally.  Occasional wheeze noted,  however, he is moving air fairly well.  CARDIAC:  Irregular rate and rhythm.  No rubs, murmurs, or gallops heard.  ABDOMEN:  Reveals no hepatosplenomegaly.  No tenderness, no distention.  Normal active bowel  sounds.  GENITOURINARY:  Not examined, as it was not indicated.  EXTREMITIES:  The patient has deformity in the right upper extremity from  prior surgery.  He has no cyanosis, clubbing, or edema.  SKIN:  The patient has a few psoriatic patches throughout.   LABORATORY DATA:  White count of 11.5 with an H&H of 13.9, 39.5, and  platelets of 157.  Chemistries:  Sodium 140, potassium 3.8, chloride 103,  bicarb 29, BUN 39, creatinine 1.6, and glucose of 165.  Cardiac enzymes were  positive for MI.  Thyroid functions, TSA noted low, further studies being  done.   BNP done on February 28 was 776.   IMPRESSION:  1. 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     I believe this was mild.  I believe the reason why his symptomatology was     augmented was because of his underlying pulmonary edema.  He appears to     have a mild tracheal bronchitis, but this does not appear to be     pneumonitis at present.  2. Atrial fibrillation and acute myocardial infarction per [facility]     Cardiology.  3. Tobacco abuse and dependence.  I agree with the proposed smoking     cessation program for the patient.  4. Gastroesophageal reflux, still symptomatic despite once daily dosage of     PTI.   PLAN:  1. Will be to place the patient on bronchodilators, both Xopenex and     Atrovent, as well as inhaled corticosteroids in the form of Pulmicort,     trying to avoid systemic steroids.  2. Agree with your management of congestive heart failure which at present     is important to avoid further wheezing or cardiac asthma.  3. Would repeat chest x-ray, PA and lateral, in the morning.  4. Would increase the PTI to twice a day dosage.  5. Would change Robitussin to Humibid DM two tablets twice a day to assist     with expectoration secretions.  6. Agree with azithromycin.  7. I see no reason why the patient cannot undergo cardiac catheterization    from my standpoint, as this is  something that he needs done ASAP.  I will     continue follow-up with you as requested.                                                  Danice Goltz, M.D. [facility]    LG/MEDQ  D:  12/10/2002  T:  12/10/2002  Job:  [id number]

## 2011-02-27 NOTE — Discharge Summary (Signed)
NAME:  Cory Ryan, Cory Ryan                           ACCOUNT NO.:  [id number]   MEDICAL RECORD NO.:  [id number]                   PATIENT TYPE:  INP   LOCATION:  A207                                 FACILITY:  [facility]   PHYSICIAN:  Kirk Ruths, M.D.            DATE OF BIRTH:  07-02-1941   DATE OF ADMISSION:  12/08/2002  DATE OF DISCHARGE:  12/08/2002                                 DISCHARGE SUMMARY   DISCHARGE DIAGNOSES:  1. Atrial fibrillation, new onset, resolved.  2. Pulmonary edema.  3. Myocardial infarction per cardiac enzymes.  4. Chronic obstructive pulmonary disease.   HOSPITAL COURSE:  This 70 year old white male was originally seen in the  office for first time visit two days before this admission.  At that time,  he was treated for COPD with asthmatic bronchitis exacerbation.  The patient  was on Augmentin, Prednisone and NDI and nebs.  On the day of admission, he  presented to the emergency room with shortness of breath.  He was found to  have atrial fibrillation, new onset with a rate of approximately 150.  The  patient was treated with 20 mg of Cardizem IV with rate improvement to  approximately 110-120.  The patient's chest x-ray showed pulmonary edema  with COPD.  The patient was originally seen by me in the emergency room and  admitted to ICU for Cardizem drip and cardiac consult.  In the interim.  The  patient's cardiac enzymes initial onset returned positive with CPK being  255, MB being 22.3 with a relative index of 8.7.  Troponin significantly  elevated at 3.29.  Again, the patient was seen in consultation by health  care and underwent echocardiogram.  The patient converted to regular sinus  rhythm before being sent to the floor.  Transfer attempt was made to [facility], but there was significant backup.  The patient also was started on  heparin per Dr. Mikey Bussing request.  Incidentally, Dr. Jenne Campus was consulted  with [facility] at [facility] and arranged for transfer  later.  Incidentally, this patient was not seen by [facility].  The  patient's CBC was within normal range.  Chemistries were within normal  range.  His cardiac enzymes continued to be suggestive of myocardial  infarction.  He was within pain.  CPK #2 was 85 with 15 MB, 18.1 index and  424 troponin.  Third set was CPK 195 with MB of 10.6, relative index 5.4 and  troponin continued to rise to 5.24.  The patient, again, was stable  throughout his stay without pain and he is transferred to Dr. Mikey Bussing  service at [facility].  He is stable at the time of transfer.  Kirk Ruths, M.D.    WMM/MEDQ  D:  12/14/2002  T:  12/14/2002  Job:  [id number]

## 2011-06-15 ENCOUNTER — Emergency Department (HOSPITAL_COMMUNITY): Payer: Medicare Other

## 2011-06-15 ENCOUNTER — Encounter: Payer: Self-pay | Admitting: Emergency Medicine

## 2011-06-15 ENCOUNTER — Other Ambulatory Visit: Payer: Self-pay

## 2011-06-15 ENCOUNTER — Emergency Department (HOSPITAL_COMMUNITY)
Admission: EM | Admit: 2011-06-15 | Discharge: 2011-06-15 | Disposition: A | Discharge status: Home / Self Care | Payer: Medicare Other | Attending: Emergency Medicine | Admitting: Emergency Medicine

## 2011-06-15 DIAGNOSIS — Z951 Presence of aortocoronary bypass graft: Secondary | ICD-10-CM | POA: Insufficient documentation

## 2011-06-15 DIAGNOSIS — I509 Heart failure, unspecified: Secondary | ICD-10-CM | POA: Insufficient documentation

## 2011-06-15 DIAGNOSIS — I4891 Unspecified atrial fibrillation: Secondary | ICD-10-CM | POA: Insufficient documentation

## 2011-06-15 DIAGNOSIS — J438 Other emphysema: Secondary | ICD-10-CM | POA: Insufficient documentation

## 2011-06-15 LAB — CBC
HCT: 42.4 % (ref 39.0–52.0)
Hemoglobin: 14.8 g/dL (ref 13.0–17.0)
MCH: 32.1 pg (ref 26.0–34.0)
MCV: 92 fL (ref 78.0–100.0)
WBC: 7.3 10*3/uL (ref 4.0–10.5)

## 2011-06-15 LAB — BASIC METABOLIC PANEL
BUN: 23 mg/dL (ref 6–23)
Chloride: 99 mEq/L (ref 96–112)
GFR calc Af Amer: >60 mL/min (ref >60)
GFR calc non Af Amer: 58 mL/min — ABNORMAL LOW (ref >60)
Glucose, Bld: 142 mg/dL — ABNORMAL HIGH (ref 70–99)

## 2011-06-15 LAB — CARDIAC PANEL(CRET KIN+CKTOT+MB+TROPI)
CK, MB: 2.4 ng/mL (ref 0.3–4.0)
Relative Index: INVALID (ref 0.0–2.5)
Troponin I: <0.3 ng/mL (ref <0.30)

## 2011-06-15 NOTE — ED Provider Notes (Signed)
History     CSN: [id number] Arrival date & time: 06/15/2011  4:09 AM  Chief Complaint  Patient presents with  . Chest Pain   HPI Comments: Seen 0523  Patient is a 70 y.o. male presenting with chest pain. The history is provided by the patient.  Chest Pain The chest pain began 1 - 2 hours ago (patient woke with a rapid heart rate and chest pressure. He has a h/o atrial fib. ). Chest pain occurs frequently. The chest pain is unchanged. At its most intense, the pain is at 5/10. The pain is currently at 1/10. The quality of the pain is described as aching, burning and tightness. The pain does not radiate. Exacerbated by: nothing.     Past Medical History  Diagnosis Date  . A-fib   . CHF (congestive heart failure)   . Emphysema   . S/P CABG x 3     Past Surgical History  Procedure Date  . Cardiac surgery     History reviewed. No pertinent family history.  History  Substance Use Topics  . Smoking status: Never Smoker   . Smokeless tobacco: Not on file  . Alcohol Use: No      Review of Systems  Cardiovascular: Positive for chest pain.  All other systems reviewed and are negative.    Physical Exam  BP 128/69  Pulse 66  Temp(Src) 97.7 F (36.5 C) (Oral)  Resp 10  Ht 5\' 10"  (1.778 m)  Wt 180 lb (81.647 kg)  BMI 25.83 kg/m2  SpO2 97%  Physical Exam  Constitutional: He is oriented to person, place, and time. He appears well-developed and well-nourished.  HENT:  Head: Normocephalic and atraumatic.  Eyes: EOM are normal.  Neck: Normal range of motion.  Cardiovascular: Normal rate.  Exam reveals no gallop.   No murmur heard.      irregular  Pulmonary/Chest: Effort normal and breath sounds normal.  Abdominal: Soft. Bowel sounds are normal.  Musculoskeletal: Normal range of motion.  Neurological: He is alert and oriented to person, place, and time.  Skin: Skin is warm. There is erythema.    ED Course  Procedures  ED ECG REPORT   Date: 06/15/2011 0359  EKG  Time: 7:15 AM  Rate: 69 Rhythm: normal sinus rhythm,  is:  Intervals:none  ST&T Change: normal  Narrative Interpretation: normal  State of condition.    No change in EKG.  Patient with h/o atrial fib awakened with rapid rate and chest discomfot.   Results for orders placed during the hospital encounter of 06/15/11  CBC      Component Value Range   WBC 7.3  4.0 - 10.5 (K/uL)   RBC 4.61  4.22 - 5.81 (MIL/uL)   Hemoglobin 14.8  13.0 - 17.0 (g/dL)   HCT 40.9  81.1 - 91.4 (%)   MCV 92.0  78.0 - 100.0 (fL)   MCH 32.1  26.0 - 34.0 (pg)   MCHC 34.9  30.0 - 36.0 (g/dL)   RDW 78.2  95.6 - 21.3 (%)   Platelets 105 (*) 150 - 400 (K/uL)  BASIC METABOLIC PANEL      Component Value Range   Sodium 132 (*) 135 - 145 (mEq/L)   Potassium 3.4 (*) 3.5 - 5.1 (mEq/L)   Chloride 99  96 - 112 (mEq/L)   CO2 24  19 - 32 (mEq/L)   Glucose, Bld 142 (*) 70 - 99 (mg/dL)   BUN 23  6 - 23 (mg/dL)   Creatinine,  Ser 1.22  0.50 - 1.35 (mg/dL)   Calcium 9.0  8.4 - 78.4 (mg/dL)   GFR calc non Af Amer 58 (*) >60 (mL/min)   GFR calc Af Amer >60  >60 (mL/min)  CARDIAC PANEL(CRET KIN+CKTOT+MB+TROPI)      Component Value Range   Total CK 51  7 - 232 (U/L)   CK, MB 2.4  0.3 - 4.0 (ng/mL)   Troponin I <0.30  <0.30 (ng/mL)   Relative Index RELATIVE INDEX IS INVALID  0.0 - 2.5   POCT I-STAT TROPONIN I      Component Value Range   Troponin i, poc 0.00  0.00 - 0.08 (ng/mL)   Comment 3                Patient with h/o atrial fibrillation, rate controlled on medicine. Last night rate increased resulting in chest discomfort. EKG obtained. No signicant chage. Patient has remianed painfree sincd arrival.  MDM Reviewed: nursing note and vitals Interpretation: labs, ECG and x-ray       [facility]. Colon Branch, MD 06/15/11 [phone]

## 2011-06-15 NOTE — ED Notes (Signed)
Woke up chest pressure , sob substernal

## 2011-06-15 NOTE — ED Notes (Signed)
Pt stating his central chest pain is now resolved and his rt side of his chest is hurting "a bit".

## 2011-06-24 ENCOUNTER — Ambulatory Visit (HOSPITAL_COMMUNITY)
Admission: RE | Admit: 2011-06-24 | Discharge: 2011-06-24 | Disposition: A | Discharge status: Home / Self Care | Payer: Medicare Other | Source: Ambulatory Visit | Attending: Internal Medicine | Admitting: Internal Medicine

## 2011-06-24 DIAGNOSIS — I428 Other cardiomyopathies: Secondary | ICD-10-CM | POA: Insufficient documentation

## 2011-06-24 NOTE — Progress Notes (Signed)
*  PRELIMINARY RESULTS* Echocardiogram 2D Echocardiogram has been performed.  Cory Ryan 06/24/2011, 10:10 AM

## 2012-01-19 ENCOUNTER — Emergency Department (HOSPITAL_COMMUNITY)
Admission: EM | Admit: 2012-01-19 | Discharge: 2012-01-19 | Disposition: A | Discharge status: Home / Self Care | Payer: Medicare Other | Attending: Emergency Medicine | Admitting: Emergency Medicine

## 2012-01-19 ENCOUNTER — Encounter (HOSPITAL_COMMUNITY): Payer: Self-pay

## 2012-01-19 ENCOUNTER — Emergency Department (HOSPITAL_COMMUNITY): Payer: Medicare Other

## 2012-01-19 DIAGNOSIS — B9789 Other viral agents as the cause of diseases classified elsewhere: Secondary | ICD-10-CM | POA: Insufficient documentation

## 2012-01-19 DIAGNOSIS — B349 Viral infection, unspecified: Secondary | ICD-10-CM

## 2012-01-19 DIAGNOSIS — R51 Headache: Secondary | ICD-10-CM | POA: Insufficient documentation

## 2012-01-19 DIAGNOSIS — E860 Dehydration: Secondary | ICD-10-CM | POA: Insufficient documentation

## 2012-01-19 DIAGNOSIS — R05 Cough: Secondary | ICD-10-CM | POA: Insufficient documentation

## 2012-01-19 DIAGNOSIS — Z951 Presence of aortocoronary bypass graft: Secondary | ICD-10-CM | POA: Insufficient documentation

## 2012-01-19 DIAGNOSIS — I4891 Unspecified atrial fibrillation: Secondary | ICD-10-CM | POA: Insufficient documentation

## 2012-01-19 DIAGNOSIS — I509 Heart failure, unspecified: Secondary | ICD-10-CM | POA: Insufficient documentation

## 2012-01-19 DIAGNOSIS — R059 Cough, unspecified: Secondary | ICD-10-CM | POA: Insufficient documentation

## 2012-01-19 DIAGNOSIS — J438 Other emphysema: Secondary | ICD-10-CM | POA: Insufficient documentation

## 2012-01-19 LAB — URINALYSIS, ROUTINE W REFLEX MICROSCOPIC
Glucose, UA: NEGATIVE mg/dL
Ketones, ur: NEGATIVE mg/dL
Leukocytes, UA: NEGATIVE
Protein, ur: NEGATIVE mg/dL
Urobilinogen, UA: 0.2 mg/dL (ref 0.0–1.0)

## 2012-01-19 LAB — CBC
HCT: 46.1 % (ref 39.0–52.0)
MCH: 30.4 pg (ref 26.0–34.0)
MCHC: 34.1 g/dL (ref 30.0–36.0)
MCV: 89.3 fL (ref 78.0–100.0)
Platelets: 120 10*3/uL — ABNORMAL LOW (ref 150–400)
RDW: 13.1 % (ref 11.5–15.5)

## 2012-01-19 LAB — BASIC METABOLIC PANEL
CO2: 26 mEq/L (ref 19–32)
Calcium: 9.9 mg/dL (ref 8.4–10.5)
Creatinine, Ser: 1.66 mg/dL — ABNORMAL HIGH (ref 0.50–1.35)
GFR calc Af Amer: 46 mL/min — ABNORMAL LOW (ref >90)
GFR calc non Af Amer: 40 mL/min — ABNORMAL LOW (ref >90)

## 2012-01-19 LAB — DIFFERENTIAL
Basophils Absolute: 0 10*3/uL (ref 0.0–0.1)
Basophils Relative: 0 % (ref 0–1)
Eosinophils Absolute: 0 10*3/uL (ref 0.0–0.7)
Eosinophils Relative: 0 % (ref 0–5)
Monocytes Absolute: 0.8 10*3/uL (ref 0.1–1.0)

## 2012-01-19 LAB — URINE MICROSCOPIC-ADD ON

## 2012-01-19 MED ORDER — SODIUM CHLORIDE 0.9 % IV BOLUS (SEPSIS)
1000.0000 mL | Freq: Once | INTRAVENOUS | Status: AC
  Administered 2012-01-19: 1000 mL via INTRAVENOUS

## 2012-01-19 MED ORDER — SODIUM CHLORIDE 0.9 % IV SOLN
INTRAVENOUS | Status: DC
  Administered 2012-01-19: 17:00:00 via INTRAVENOUS

## 2012-01-19 MED ORDER — METOCLOPRAMIDE HCL 5 MG/ML IJ SOLN
10.0000 mg | Freq: Once | INTRAMUSCULAR | Status: AC
  Administered 2012-01-19: 10 mg via INTRAVENOUS
  Filled 2012-01-19: qty 2

## 2012-01-19 MED ORDER — ONDANSETRON HCL 8 MG PO TABS: 8.0000 mg | ORAL_TABLET | ORAL | Status: AC | PRN

## 2012-01-19 MED ORDER — KETOROLAC TROMETHAMINE 30 MG/ML IJ SOLN
30.0000 mg | Freq: Once | INTRAMUSCULAR | Status: AC
  Administered 2012-01-19: 30 mg via INTRAVENOUS
  Filled 2012-01-19: qty 1

## 2012-01-19 NOTE — ED Notes (Signed)
Complain of headache, vomiting and diarrhea

## 2012-01-19 NOTE — ED Provider Notes (Signed)
History  This chart was scribed for Donnetta Hutching, MD by Bennett Scrape. This patient was seen in room APA16A/[facility]16A and the patient's care was started at 3:30PM.  CSN: [id number]  Arrival date & time 01/19/12  1523   First MD Initiated Contact with Patient 01/19/12 1528      Chief Complaint  Patient presents with  . Influenza    The history is provided by the patient. No language interpreter was used.    Cory Ryan is a 71 y.o. male who presents to the Emergency Department complaining of 2 to 3 days of gradual onset, gradually worsening, constant frontally located HA with one episode of associated non-bloody emesis and undescribed diarrhea. The HA is non-radiating. He denies any modifying factors. Pt didn't take any medications to improve symptoms PTA. Pt also c/o non-productive coughing and states that he was diagnosed with laryngitis and put on Septra by his PCP on 01/14/12. Pt reports that cough is better. He states that he has chronic trouble urinating but denies any recent changes with urinating. Pt reports that he has been drinking fluids and urinating at baseline.  He denies fever, sore throat, congestion, visual disturbance, dizziness, abdominal pain, dysuria, chest pain and confusion as associated symptoms. He has a h/o A. Fib, CHF and CABG. He denies smoking and alcohol use.  Pt's PCP is Dr. Regino Schultze.  Past Medical History  Diagnosis Date  . A-fib   . CHF (congestive heart failure)   . Emphysema   . S/P CABG x 3     Past Surgical History  Procedure Date  . Cardiac surgery     No family history on file.  History  Substance Use Topics  . Smoking status: Never Smoker   . Smokeless tobacco: Not on file  . Alcohol Use: No      Review of Systems A complete 10 system review of systems was obtained and all systems are negative except as noted in the HPI and PMH.   Allergies  Review of patient's allergies indicates no known allergies.  Home Medications   Current  Outpatient Rx  Name Route Sig Dispense Refill  . CARVEDILOL 6.25 MG PO TABS Oral Take 6.25 mg by mouth 2 (two) times daily with a meal.      . CLOPIDOGREL BISULFATE 75 MG PO TABS Oral Take 75 mg by mouth daily.      [name] DILTIAZEM HCL ER COATED BEADS 300 MG PO CP24 Oral Take 300 mg by mouth daily.      [name] DIPHENHYDRAMINE HCL (SLEEP) 25 MG PO TABS Oral Take 25 mg by mouth at bedtime as needed.      . FUROSEMIDE 80 MG PO TABS Oral Take 80 mg by mouth daily.      [name] POTASSIUM CHLORIDE CRYS ER 20 MEQ PO TBCR Oral Take 20 mEq by mouth 2 (two) times daily.      [name] ROSUVASTATIN CALCIUM 5 MG PO TABS Oral Take 5 mg by mouth daily.      [name] SPIRONOLACTONE 25 MG PO TABS Oral Take 12.5 mg by mouth 2 (two) times daily.        Triage Vitals: BP 122/74  Pulse 81  Temp(Src) 97.6 F (36.4 C) (Oral)  Resp 18  Ht 5\' 10"  (1.778 m)  Wt 175 lb (79.379 kg)  BMI 25.11 kg/m2  SpO2 96%  Physical Exam  Nursing note and vitals reviewed. Constitutional: He is oriented to person, place, and time. He appears well-developed and well-nourished.  Pt appears to be slightly dehydrated  HENT:  Head: Normocephalic and atraumatic.  Eyes: Conjunctivae and EOM are normal.  Neck: Normal range of motion. Neck supple.  Cardiovascular: Normal rate and regular rhythm.   Pulmonary/Chest: Effort normal and breath sounds normal.  Abdominal: Soft. There is no tenderness.  Musculoskeletal: Normal range of motion. He exhibits no edema.  Neurological: He is alert and oriented to person, place, and time.  Skin: Skin is warm and dry.  Psychiatric: He has a normal mood and affect. His behavior is normal.    ED Course  Procedures (including critical care time)  DIAGNOSTIC STUDIES: Oxygen Saturation is 96% on room air, adequate by my interpretation.    COORDINATION OF CARE: 3:35PM-Discussed IV fluids, blood work, urinalysis and pain medications with pt and pt agreed to plan.  Labs Reviewed  CBC - Abnormal; Notable for the  following:    Platelets 120 (*)    All other components within normal limits  DIFFERENTIAL - Abnormal; Notable for the following:    Neutrophils Relative 79 (*)    Neutro Abs 8.3 (*)    All other components within normal limits  BASIC METABOLIC PANEL - Abnormal; Notable for the following:    Sodium 133 (*)    Potassium 5.5 (*)    Glucose, Bld 132 (*)    BUN 30 (*)    Creatinine, Ser 1.66 (*)    GFR calc non Af Amer 40 (*)    GFR calc Af Amer 46 (*)    All other components within normal limits  URINALYSIS, ROUTINE W REFLEX MICROSCOPIC - Abnormal; Notable for the following:    Color, Urine STRAW (*)    Hgb urine dipstick SMALL (*)    All other components within normal limits  URINE MICROSCOPIC-ADD ON - Abnormal; Notable for the following:    Casts HYALINE CASTS (*)    All other components within normal limits   Dg Chest 2 View  01/19/2012  *RADIOLOGY REPORT*  Clinical Data: Cough and headache.  CHEST - 2 VIEW  Comparison: Chest radiograph 06/15/2011  Findings: Again noted is a prominent azygos lobe.  The patient is status post median sternotomy and CABG.  Heart size is stable.  No evidence for edema or focal airspace disease.  Degenerative changes in lower thoracic spine.  IMPRESSION: No acute chest findings.  Original Report Authenticated By: Richarda Overlie, M.D.     No diagnosis found.    MDM   Patient feeling much better after IV fluids. His lips are moist and pink. He has urinated x2.  Elevated potassium and creatinine noted. This can be rechecked as an outpatient. Prescription for Zofran    I personally performed the services described in this documentation, which was scribed in my presence. The recorded information has been reviewed and considered.    Donnetta Hutching, MD 01/19/12 2038

## 2012-01-19 NOTE — Discharge Instructions (Signed)
Increase fluids. Medications for nausea. You will need to follow up with your primary care Dr.  Potassium was slightly elevated at 5.5.  Creatinine which is a measure of kidney function was slightly elevated at 1.66.  These will need to be rechecked by your Dr

## 2012-01-22 ENCOUNTER — Encounter (HOSPITAL_COMMUNITY): Payer: Self-pay | Admitting: Emergency Medicine

## 2012-01-22 ENCOUNTER — Emergency Department (HOSPITAL_COMMUNITY)
Admission: EM | Admit: 2012-01-22 | Discharge: 2012-01-22 | Disposition: A | Discharge status: Home / Self Care | Payer: Medicare Other | Attending: Emergency Medicine | Admitting: Emergency Medicine

## 2012-01-22 DIAGNOSIS — I951 Orthostatic hypotension: Secondary | ICD-10-CM | POA: Insufficient documentation

## 2012-01-22 DIAGNOSIS — T148XXA Other injury of unspecified body region, initial encounter: Secondary | ICD-10-CM

## 2012-01-22 DIAGNOSIS — D649 Anemia, unspecified: Secondary | ICD-10-CM | POA: Insufficient documentation

## 2012-01-22 DIAGNOSIS — Z7901 Long term (current) use of anticoagulants: Secondary | ICD-10-CM | POA: Insufficient documentation

## 2012-01-22 DIAGNOSIS — T792XXA Traumatic secondary and recurrent hemorrhage and seroma, initial encounter: Secondary | ICD-10-CM | POA: Insufficient documentation

## 2012-01-22 DIAGNOSIS — D6832 Hemorrhagic disorder due to extrinsic circulating anticoagulants: Secondary | ICD-10-CM

## 2012-01-22 DIAGNOSIS — T45515A Adverse effect of anticoagulants, initial encounter: Secondary | ICD-10-CM | POA: Insufficient documentation

## 2012-01-22 LAB — DIFFERENTIAL
Lymphs Abs: 1 10*3/uL (ref 0.7–4.0)
Monocytes Relative: 8 % (ref 3–12)
Neutro Abs: 8.1 10*3/uL — ABNORMAL HIGH (ref 1.7–7.7)
Neutrophils Relative %: 82 % — ABNORMAL HIGH (ref 43–77)

## 2012-01-22 LAB — CBC
Hemoglobin: 12.2 g/dL — ABNORMAL LOW (ref 13.0–17.0)
MCH: 30.3 pg (ref 26.0–34.0)
RBC: 4.02 MIL/uL — ABNORMAL LOW (ref 4.22–5.81)

## 2012-01-22 LAB — BASIC METABOLIC PANEL
CO2: 28 mEq/L (ref 19–32)
Chloride: 99 mEq/L (ref 96–112)
Sodium: 134 mEq/L — ABNORMAL LOW (ref 135–145)

## 2012-01-22 LAB — PROTIME-INR: INR: 4.82 — ABNORMAL HIGH (ref 0.00–1.49)

## 2012-01-22 MED ORDER — BACITRACIN-NEOMYCIN-POLYMYXIN 400-5-5000 EX OINT
TOPICAL_OINTMENT | CUTANEOUS | Status: AC
  Administered 2012-01-22: 2 via TOPICAL
  Filled 2012-01-22: qty 1

## 2012-01-22 NOTE — Discharge Instructions (Signed)
Your INR tonight was 4.82, DO NOT TAKE YOUR COUMADIN UNTIL YOU GET YOUR BLOOD (INR) RETESTED ON Monday OR Tuesday.They should also recheck your hemoglobin.  ALSO YOUR BLOOD PRESSURE IS LOW TONIGHT, GETTING INTO THE LOW 90'S ON THE TOP. DO NOT TAKE YOU BLOOD PRESSURE MEDICATIONS TONIGHT. CHECK YOUR BLOOD PRESSURE IN THE MORNING AND IF YOUR BLOOD PRESSURE IS OVER 110 YOU CAN TAKE YOUR BLOOD PRESSURE MEDICATION. If you skin lesions bleed, hold pressure over the wound for 5-10 minutes to get it to stop. Return to the ED if you feel worse (weak, chest pain, dizzy).

## 2012-01-22 NOTE — ED Notes (Signed)
Pt states he was seen on Tuesday night and an iv was started. Pt was discharged and the pt states the iv site has been bleeding since he left. His dressing was removed and he has 3 small skin tears that are oozing blood at this time. Pt states he take coumadin. Dressing with telfa and coban was applied.

## 2012-01-22 NOTE — ED Provider Notes (Signed)
History     CSN: [id number]  Arrival date & time 01/22/12  1334   First MD Initiated Contact with Patient 01/22/12 1449      Chief Complaint  Patient presents with  . Wound Check    (Consider location/radiation/quality/duration/timing/severity/associated sxs/prior treatment) HPI  Patient relates he was seen in the emergency department on April 9 when he was having headache, nausea, vomiting, and diarrhea. He relates he took off his bandage on his IV site the following morning and he's had bleeding since. He relates he's feeling better. Patient states he did not does not feel weak and states he "feels good". Patient is on chronic Coumadin therapy. He states that he stopped taking his Coumadin because of the bleeding and his last dose was 2 days ago. He states his last INR check was approximately a week ago and was okay.  PCP Dr. Regino Schultze Cardiologist Dr. Rennis Golden with [facility] heart and vascular  Past Medical History  Diagnosis Date  . A-fib   . CHF (congestive heart failure)   . Emphysema   . S/P CABG x 3     Past Surgical History  Procedure Date  . Cardiac surgery     History reviewed. No pertinent family history.  History  Substance Use Topics  . Smoking status: Never Smoker   . Smokeless tobacco: Not on file  . Alcohol Use: No   Pt lives at home   Review of Systems  All other systems reviewed and are negative.    Allergies  Review of patient's allergies indicates no known allergies.  Home Medications   Current Outpatient Rx  Name Route Sig Dispense Refill  . CARVEDILOL 6.25 MG PO TABS Oral Take 6.25 mg by mouth 2 (two) times daily with a meal.      . DILTIAZEM HCL ER COATED BEADS 300 MG PO CP24 Oral Take 300 mg by mouth daily.      . FUROSEMIDE 80 MG PO TABS Oral Take 80 mg by mouth at bedtime.     [name] POTASSIUM CHLORIDE CRYS ER 20 MEQ PO TBCR Oral Take 20 mEq by mouth 2 (two) times daily.      [name] ROSUVASTATIN CALCIUM 5 MG PO TABS Oral Take 5 mg by mouth  at bedtime.     . SPIRONOLACTONE 25 MG PO TABS Oral Take 12.5 mg by mouth 2 (two) times daily.      . WARFARIN SODIUM 5 MG PO TABS Oral Take 5 mg by mouth See admin instructions. Take one tablet (5mg ) on Sundays, Tuesdays, Thursdays, and Saturdays, then take one-half tablet (2.5mg ) on Mondays, Wednesdays, and Fridays    . ONDANSETRON HCL 8 MG PO TABS Oral Take 1 tablet (8 mg total) by mouth every 4 (four) hours as needed for nausea. 8 tablet 0  . SULFAMETHOXAZOLE-TMP DS 800-160 MG PO TABS Oral Take 1 tablet by mouth 2 (two) times daily. Patient completed regimen yesterday (01-21-12)      BP 89/56  Pulse 96  Temp(Src) 97.5 F (36.4 C) (Oral)  Resp 18  Ht 5\' 10"  (1.778 m)  Wt 175 lb (79.379 kg)  BMI 25.11 kg/m2  SpO2 97%  Vital signs normal except hypotension   Physical Exam  Nursing note and vitals reviewed. Constitutional: He is oriented to person, place, and time. He appears well-developed and well-nourished.  Non-toxic appearance. He does not appear ill. No distress.  HENT:  Head: Normocephalic and atraumatic.  Right Ear: External ear normal.  Left Ear: External ear normal.  Nose: Nose normal. No mucosal edema or rhinorrhea.  Mouth/Throat: Oropharynx is clear and moist and mucous membranes are normal. No dental abscesses or uvula swelling.  Eyes: Conjunctivae and EOM are normal. Pupils are equal, round, and reactive to light.  Neck: Normal range of motion and full passive range of motion without pain. Neck supple.  Cardiovascular: Exam reveals no gallop and no friction rub.   No murmur heard. Pulmonary/Chest: Effort normal. No respiratory distress. He has no rhonchi. He exhibits no crepitus.  Abdominal: Normal appearance.  Musculoskeletal: Normal range of motion. He exhibits no edema and no tenderness.       Moves all extremities well. Dressing removed form his right forearm.  He has two areas on his dorsum of his right forearm that appear to be avulsed one about 1/2 x 1 1/2 cm  and the other smaller than a cm. I suspect the skin avulsed when he pulled off the tape from the dressing after his IV was pulled.  Neurological: He is alert and oriented to person, place, and time. He has normal strength. No cranial nerve deficit.  Skin: Skin is warm, dry and intact. No rash noted. No erythema. No pallor.  Psychiatric: He has a normal mood and affect. His speech is normal and behavior is normal. His mood appears not anxious.    ED Course  Procedures (including critical care time)  Orthostatic blood pressures show his blood pressure drops from 125/72 on lying to 93/57 standing with a pulse rate increase of 70-90.   Patient given his laboratory results and his low pressure. However patient history adamant that he needs to go home. He states his wife is in a wheelchair he needs to go home to take care of her. He absolutely refuses to stay in the hospital overnight. He was advised to return if he feels worse.  Patient sitting on the side of the stretcher eating in no distress.  Results for orders placed during the hospital encounter of 01/22/12  CBC      Component Value Range   WBC 9.9  4.0 - 10.5 (K/uL)   RBC 4.02 (*) 4.22 - 5.81 (MIL/uL)   Hemoglobin 12.2 (*) 13.0 - 17.0 (g/dL)   HCT 16.1 (*) 09.6 - 52.0 (%)   MCV 89.8  78.0 - 100.0 (fL)   MCH 30.3  26.0 - 34.0 (pg)   MCHC 33.8  30.0 - 36.0 (g/dL)   RDW 04.5  40.9 - 81.1 (%)   Platelets 93 (*) 150 - 400 (K/uL)  DIFFERENTIAL      Component Value Range   Neutrophils Relative 82 (*) 43 - 77 (%)   Neutro Abs 8.1 (*) 1.7 - 7.7 (K/uL)   Lymphocytes Relative 10 (*) 12 - 46 (%)   Lymphs Abs 1.0  0.7 - 4.0 (K/uL)   Monocytes Relative 8  3 - 12 (%)   Monocytes Absolute 0.7  0.1 - 1.0 (K/uL)   Eosinophils Relative 1  0 - 5 (%)   Eosinophils Absolute 0.1  0.0 - 0.7 (K/uL)   Basophils Relative 0  0 - 1 (%)   Basophils Absolute 0.0  0.0 - 0.1 (K/uL)  PROTIME-INR      Component Value Range   Prothrombin Time 45.8 (*) 11.6 -  15.2 (seconds)   INR 4.82 (*) 0.00 - 1.49   BASIC METABOLIC PANEL      Component Value Range   Sodium 134 (*) 135 - 145 (mEq/L)   Potassium 4.7  3.5 -  5.1 (mEq/L)   Chloride 99  96 - 112 (mEq/L)   CO2 28  19 - 32 (mEq/L)   Glucose, Bld 139 (*) 70 - 99 (mg/dL)   BUN 27 (*) 6 - 23 (mg/dL)   Creatinine, Ser 8.11 (*) 0.50 - 1.35 (mg/dL)   Calcium 9.4  8.4 - 91.4 (mg/dL)   GFR calc non Af Amer 46 (*) >90 (mL/min)   GFR calc Af Amer 54 (*) >90 (mL/min)   Laboratory interpretation all normal except over coumadinization, stable renal insufficiency, and hemoglobin has dropped from 15-12 since his last ED visit      1. Bleeding from wound   2. Warfarin-induced coagulopathy   3. Anemia   4. Orthostatic hypotension    Plan discharge  Patient to stop his Coumadin over the weekend until he can get his INR rechecked. He is to stop his his blood  pressure pills tonight and restart tomorrow his blood pressure is improved.  Devoria Albe, MD, FACEP     MDM          Ward Givens, MD 01/22/12 [phone]

## 2012-01-22 NOTE — ED Notes (Signed)
Patient with no complaints at this time. Respirations even and unlabored. Skin warm/dry. Discharge instructions reviewed with patient at this time. Patient given opportunity to voice concerns/ask questions. Patient discharged at this time and left Emergency Department with steady gait.   

## 2012-02-06 ENCOUNTER — Emergency Department (HOSPITAL_COMMUNITY): Payer: Medicare Other

## 2012-02-06 ENCOUNTER — Inpatient Hospital Stay (HOSPITAL_COMMUNITY)
Admission: EM | Admit: 2012-02-06 | Discharge: 2012-02-08 | DRG: 683 | Disposition: A | Discharge status: Home / Self Care | Payer: Medicare Other | Attending: Internal Medicine | Admitting: Internal Medicine

## 2012-02-06 ENCOUNTER — Encounter (HOSPITAL_COMMUNITY): Payer: Self-pay | Admitting: *Deleted

## 2012-02-06 DIAGNOSIS — R7309 Other abnormal glucose: Secondary | ICD-10-CM | POA: Diagnosis present

## 2012-02-06 DIAGNOSIS — J329 Chronic sinusitis, unspecified: Secondary | ICD-10-CM | POA: Diagnosis present

## 2012-02-06 DIAGNOSIS — R112 Nausea with vomiting, unspecified: Secondary | ICD-10-CM | POA: Diagnosis present

## 2012-02-06 DIAGNOSIS — N390 Urinary tract infection, site not specified: Secondary | ICD-10-CM | POA: Diagnosis present

## 2012-02-06 DIAGNOSIS — H9209 Otalgia, unspecified ear: Secondary | ICD-10-CM | POA: Diagnosis present

## 2012-02-06 DIAGNOSIS — N289 Disorder of kidney and ureter, unspecified: Secondary | ICD-10-CM

## 2012-02-06 DIAGNOSIS — I5022 Chronic systolic (congestive) heart failure: Secondary | ICD-10-CM | POA: Diagnosis present

## 2012-02-06 DIAGNOSIS — R42 Dizziness and giddiness: Secondary | ICD-10-CM | POA: Diagnosis present

## 2012-02-06 DIAGNOSIS — I4891 Unspecified atrial fibrillation: Secondary | ICD-10-CM | POA: Diagnosis present

## 2012-02-06 DIAGNOSIS — T45515A Adverse effect of anticoagulants, initial encounter: Secondary | ICD-10-CM | POA: Diagnosis present

## 2012-02-06 DIAGNOSIS — N179 Acute kidney failure, unspecified: Principal | ICD-10-CM | POA: Diagnosis present

## 2012-02-06 DIAGNOSIS — D696 Thrombocytopenia, unspecified: Secondary | ICD-10-CM | POA: Diagnosis present

## 2012-02-06 DIAGNOSIS — D6832 Hemorrhagic disorder due to extrinsic circulating anticoagulants: Secondary | ICD-10-CM | POA: Diagnosis present

## 2012-02-06 DIAGNOSIS — R791 Abnormal coagulation profile: Secondary | ICD-10-CM | POA: Diagnosis present

## 2012-02-06 DIAGNOSIS — H6120 Impacted cerumen, unspecified ear: Secondary | ICD-10-CM | POA: Diagnosis present

## 2012-02-06 DIAGNOSIS — E871 Hypo-osmolality and hyponatremia: Secondary | ICD-10-CM | POA: Diagnosis present

## 2012-02-06 DIAGNOSIS — I509 Heart failure, unspecified: Secondary | ICD-10-CM | POA: Diagnosis present

## 2012-02-06 DIAGNOSIS — Z7901 Long term (current) use of anticoagulants: Secondary | ICD-10-CM

## 2012-02-06 DIAGNOSIS — R739 Hyperglycemia, unspecified: Secondary | ICD-10-CM | POA: Diagnosis present

## 2012-02-06 HISTORY — DX: Ischemic cardiomyopathy: I25.5

## 2012-02-06 HISTORY — DX: Other nonspecific abnormal finding of lung field: R91.8

## 2012-02-06 HISTORY — DX: Hypothyroidism, unspecified: E03.9

## 2012-02-06 HISTORY — DX: Polyp of colon: K63.5

## 2012-02-06 HISTORY — DX: Gastric ulcer, unspecified as acute or chronic, without hemorrhage or perforation: K25.9

## 2012-02-06 HISTORY — DX: Psoriasis, unspecified: L40.9

## 2012-02-06 LAB — HEMOGLOBIN A1C: Hgb A1c MFr Bld: 5.7 % — ABNORMAL HIGH (ref <5.7)

## 2012-02-06 LAB — COMPREHENSIVE METABOLIC PANEL
AST: 12 U/L (ref 0–37)
BUN: 51 mg/dL — ABNORMAL HIGH (ref 6–23)
CO2: 32 mEq/L (ref 19–32)
Chloride: 89 mEq/L — ABNORMAL LOW (ref 96–112)
Creatinine, Ser: 2.44 mg/dL — ABNORMAL HIGH (ref 0.50–1.35)
GFR calc non Af Amer: 25 mL/min — ABNORMAL LOW (ref >90)
Total Bilirubin: 0.6 mg/dL (ref 0.3–1.2)

## 2012-02-06 LAB — URINE MICROSCOPIC-ADD ON

## 2012-02-06 LAB — URINALYSIS, ROUTINE W REFLEX MICROSCOPIC
Glucose, UA: NEGATIVE mg/dL
Leukocytes, UA: NEGATIVE
Specific Gravity, Urine: 1.02 (ref 1.005–1.030)

## 2012-02-06 LAB — CBC
HCT: 49 % (ref 39.0–52.0)
Hemoglobin: 16.9 g/dL (ref 13.0–17.0)
MCV: 89.1 fL (ref 78.0–100.0)
RBC: 5.5 MIL/uL (ref 4.22–5.81)
WBC: 15.3 10*3/uL — ABNORMAL HIGH (ref 4.0–10.5)

## 2012-02-06 LAB — PROTIME-INR
INR: 3.63 — ABNORMAL HIGH (ref 0.00–1.49)
Prothrombin Time: 36.7 seconds — ABNORMAL HIGH (ref 11.6–15.2)

## 2012-02-06 MED ORDER — SODIUM CHLORIDE 0.9 % IJ SOLN
INTRAMUSCULAR | Status: AC
  Filled 2012-02-06: qty 3

## 2012-02-06 MED ORDER — ALUM & MAG HYDROXIDE-SIMETH 200-200-20 MG/5ML PO SUSP: 30.0000 mL | Freq: Four times a day (QID) | ORAL | Status: DC | PRN

## 2012-02-06 MED ORDER — ACETAMINOPHEN 325 MG PO TABS: 650.0000 mg | ORAL_TABLET | Freq: Four times a day (QID) | ORAL | Status: DC | PRN

## 2012-02-06 MED ORDER — DEXTROSE 5 % IV SOLN
1.0000 g | Freq: Once | INTRAVENOUS | Status: AC
  Administered 2012-02-06: 1 g via INTRAVENOUS
  Filled 2012-02-06: qty 10

## 2012-02-06 MED ORDER — SODIUM CHLORIDE 0.9 % IV BOLUS (SEPSIS)
250.0000 mL | Freq: Once | INTRAVENOUS | Status: AC
  Administered 2012-02-06: 250 mL via INTRAVENOUS

## 2012-02-06 MED ORDER — ACETAMINOPHEN 650 MG RE SUPP: 650.0000 mg | Freq: Four times a day (QID) | RECTAL | Status: DC | PRN

## 2012-02-06 MED ORDER — PANTOPRAZOLE SODIUM 40 MG IV SOLR
40.0000 mg | INTRAVENOUS | Status: DC
  Administered 2012-02-06 – 2012-02-07 (×2): 40 mg via INTRAVENOUS
  Filled 2012-02-06 (×2): qty 40

## 2012-02-06 MED ORDER — SODIUM CHLORIDE 0.9 % IV SOLN
INTRAVENOUS | Status: DC
  Administered 2012-02-06: 1000 mL via INTRAVENOUS
  Administered 2012-02-07: 17:00:00 via INTRAVENOUS
  Administered 2012-02-07: 1000 mL via INTRAVENOUS

## 2012-02-06 MED ORDER — MORPHINE SULFATE 2 MG/ML IJ SOLN: 2.0000 mg | INTRAMUSCULAR | Status: DC | PRN

## 2012-02-06 MED ORDER — CARVEDILOL 3.125 MG PO TABS
3.1250 mg | ORAL_TABLET | Freq: Two times a day (BID) | ORAL | Status: DC
  Filled 2012-02-06 (×4): qty 1

## 2012-02-06 MED ORDER — SODIUM CHLORIDE 0.9 % IV SOLN: INTRAVENOUS | Status: DC

## 2012-02-06 MED ORDER — WARFARIN - PHARMACIST DOSING INPATIENT: Freq: Every day | Status: DC

## 2012-02-06 MED ORDER — SODIUM CHLORIDE 0.9 % IJ SOLN
3.0000 mL | Freq: Two times a day (BID) | INTRAMUSCULAR | Status: DC
  Administered 2012-02-07 – 2012-02-08 (×2): 3 mL via INTRAVENOUS
  Filled 2012-02-06 (×3): qty 3

## 2012-02-06 MED ORDER — DEXTROSE 5 % IV SOLN
1.0000 g | INTRAVENOUS | Status: DC
  Administered 2012-02-07: 1 g via INTRAVENOUS
  Filled 2012-02-06: qty 10

## 2012-02-06 MED ORDER — ALBUTEROL SULFATE (5 MG/ML) 0.5% IN NEBU: 2.5000 mg | INHALATION_SOLUTION | RESPIRATORY_TRACT | Status: DC | PRN

## 2012-02-06 MED ORDER — MECLIZINE HCL 12.5 MG PO TABS: 12.5000 mg | ORAL_TABLET | Freq: Three times a day (TID) | ORAL | Status: DC | PRN

## 2012-02-06 MED ORDER — OXYCODONE HCL 5 MG PO TABS
5.0000 mg | ORAL_TABLET | ORAL | Status: DC | PRN
  Administered 2012-02-06: 5 mg via ORAL
  Filled 2012-02-06: qty 1

## 2012-02-06 MED ORDER — ONDANSETRON HCL 4 MG PO TABS: 4.0000 mg | ORAL_TABLET | Freq: Four times a day (QID) | ORAL | Status: DC | PRN

## 2012-02-06 MED ORDER — CARVEDILOL 3.125 MG PO TABS
6.2500 mg | ORAL_TABLET | Freq: Two times a day (BID) | ORAL | Status: DC
  Administered 2012-02-06 – 2012-02-08 (×4): 6.25 mg via ORAL
  Filled 2012-02-06: qty 2
  Filled 2012-02-06: qty 1
  Filled 2012-02-06: qty 2
  Filled 2012-02-06: qty 1
  Filled 2012-02-06: qty 2
  Filled 2012-02-06 (×2): qty 1
  Filled 2012-02-06: qty 2

## 2012-02-06 MED ORDER — SODIUM CHLORIDE 0.9 % IV SOLN
INTRAVENOUS | Status: DC
  Administered 2012-02-06: 16:00:00 via INTRAVENOUS

## 2012-02-06 MED ORDER — ONDANSETRON HCL 4 MG/2ML IJ SOLN: 4.0000 mg | Freq: Four times a day (QID) | INTRAMUSCULAR | Status: DC | PRN

## 2012-02-06 NOTE — Consult Note (Signed)
ANTICOAGULATION CONSULT NOTE - Initial Consult  Pharmacy Consult for Warfarin Indication: atrial fibrillation  No Known Allergies  Patient Measurements: Height: 5\' 10"  (177.8 cm) Weight: 175 lb (79.379 kg) IBW/kg (Calculated) : 73   Vital Signs: Temp: 98.2 F (36.8 C) (04/27 1604) Temp src: Oral (04/27 1604) BP: 126/62 mmHg (04/27 1604) Pulse Rate: 74  (04/27 1604)  Labs:  Basename 02/06/12 1439 02/06/12 1233  HGB -- 16.9  HCT -- 49.0  PLT -- 103*  APTT -- --  LABPROT 36.7* --  INR 3.63* --  HEPARINUNFRC -- --  CREATININE -- 2.44*  CKTOTAL -- --  CKMB -- --  TROPONINI -- --   Estimated Creatinine Clearance: 28.3 ml/min (by C-G formula based on Cr of 2.44).  Medical History: Past Medical History  Diagnosis Date  . A-fib     On coumadin  . CHF (congestive heart failure)     Systolic. EF 40-45%, per Echo 06/2011  . Emphysema   . CAD (coronary artery disease)   . Gastric erosions 12/2005    per EGD; Dr. Jena Gauss  . Colon polyps 12/2005    per colonoscopy  . Ischemic cardiomyopathy     Echo 06/2011. EF 40-45%.  . Aortic stenosis, moderate 06/2011    per Echo.  . Mitral valve regurgitation 06/2011    Moderate per Echo.  . Pulmonary nodules 12/2005     per CT chest  . Hypothyroidism   . Psoriasis     Medications:   (Not in a hospital admission)  Assessment: INR supra-therapeutic Goal of Therapy:  INR 2-3   Plan: No Coumadin today INR daily  Valrie Hart A 02/06/2012,5:03 PM

## 2012-02-06 NOTE — H&P (Signed)
Cory Ryan MRN: [id number] DOB/AGE: 1941/05/18 71 y.o. Primary Care Physician:MCGOUGH,WILLIAM M, MD, MD Admit date: 02/06/2012 Chief Complaint: Dizziness, generalized malaise, headache, nausea, and vomiting. HPI: The patient is a 71 year old man with a history significant for ischemic cardiomyopathy, status post CABG, chronic atrial fibrillation, chronic warfarin therapy, who presents to the emergency department today with a chief complaint of dizziness and generalized malaise/weakness. His symptoms started several days ago. He describes the dizziness as a spinning dizziness. He is also swimmy headed when he tries to stand up from sitting. He denies passing out. He has had a frontal headache which has been intermittent over the past couple weeks. This was associated with 2 episodes of nausea and vomiting a couple days ago, but none since then. He denies abdominal pain. However, he has had intermittent burning with urination. He denies subjective fever or chills. He denies visual changes. He denies difficulty swallowing or difficulty speaking. He has no focal weakness in his arms or his legs. He just feels weak all over. He did have a diarrheal illness a week or 2 ago. The diarrhea has now stopped. He was seen in the emergency department on on January 19 2012 for diarrhea, headache, and vomiting. He was diagnosed with influenza. He was treated supportively and discharged home. However, apparently, the IV that had been placed and/or the tape to cover his skin, caused a skin tear which led to bleeding in the setting of a supratherapeutic INR. It has now stopped. He was also recently treated for laryngitis with Septra by his primary care physician, a couple of weeks ago.  In the emergency department, patient is noted to be hemodynamically stable and afebrile, however, his blood pressure is on the lower end of normal. His chest x-ray reveals no acute cardiopulmonary abnormalities. CT scan of his head revealed no  acute intracranial abnormalities, but with chronic ischemic changes and a chronic appearing sphenoid sinus opacification. His lab data are significant for a serum sodium of 132, creatinine of 2.44, calcium was 10.9, glucose 155, WBC of 15.3, and platelet count of 103. His INR is 3.63. His urinalysis is suggestive of infection. He is being admitted for further evaluation and management.  Past Medical History  Diagnosis Date  . A-fib     On coumadin  . CHF (congestive heart failure)     Systolic. EF 40-45%, per Echo 06/2011  . Emphysema   . CAD (coronary artery disease)   . Gastric erosions 12/2005    per EGD; Dr. Jena Gauss  . Colon polyps 12/2005    per colonoscopy  . Ischemic cardiomyopathy     Echo 06/2011. EF 40-45%.  . Aortic stenosis, moderate 06/2011    per Echo.  . Mitral valve regurgitation 06/2011    Moderate per Echo.  . Pulmonary nodules 12/2005     per CT chest  . Hypothyroidism   . Psoriasis     Past Surgical History  Procedure Date  . Cabg x 3     Prior to Admission medications   Medication Sig Start Date End Date Taking? Authorizing Provider  carvedilol (COREG) 6.25 MG tablet Take 6.25 mg by mouth 2 (two) times daily with a meal.     Yes Historical Provider, MD  diltiazem (CARDIZEM CD) 300 MG 24 hr capsule Take 300 mg by mouth daily.     Yes Historical Provider, MD  furosemide (LASIX) 80 MG tablet Take 80 mg by mouth at bedtime.    Yes Historical Provider, MD  potassium  chloride SA (K-DUR,KLOR-CON) 20 MEQ tablet Take 20 mEq by mouth 2 (two) times daily.     Yes Historical Provider, MD  spironolactone (ALDACTONE) 25 MG tablet Take 12.5 mg by mouth 2 (two) times daily.     Yes Historical Provider, MD  warfarin (COUMADIN) 5 MG tablet Take 5 mg by mouth See admin instructions. Take one tablet (5mg ) on Sundays, Tuesdays, Thursdays, and Saturdays, then take one-half tablet (2.5mg ) on Mondays, Wednesdays, and Fridays   Yes Historical Provider, MD    Allergies: No Known  Allergies  History reviewed. No pertinent family history.  Social History: The patient is married. He lives in [place]. He has 2 children. He is retired. He no longer drives. He stopped smoking and drinking alcohol several years ago. He denies illicit drug use.          ROS: As above in history present illness, otherwise negative. Specifically, he denies chest pain and shortness of breath.   PHYSICAL EXAM: Blood pressure 126/62, pulse 74, temperature 98.2 F (36.8 C), temperature source Oral, resp. rate 20, height 5\' 10"  (1.778 m), weight 79.379 kg (175 lb), SpO2 98.00%.  General: Pleasant 71 year old man sitting up in bed, in no acute distress. He does appear ill. HEENT: Head is cephalic, nontraumatic. Pupils are equal, round, and reactive to light. Extraocular movements are intact. Conjunctivae are clear. Sclerae are white. Oropharynx reveals dry mucous membranes. Poor dentition with multiple caries and broken off teeth. No posterior exudates or erythema. Tympanic membranes are obscured by moderate to large amount of cerumen bilaterally. Nasal mucosa is dry with no active rhinorrhea. No maxillary sinus tenderness palpated. Neck: Supple, no adenopathy, no thyromegaly, no JVD. Lungs: Clear anteriorly. Heart: Distant irregular, irregular. Abdomen: Positive bowel sounds, soft, nontender, nondistended. Extremities: No pedal edema. Pedal pulses barely palpable. Neurologic: He is alert and oriented x3. Cranial nerves II through XII are intact. Strength is 5 over 5 throughout in the supine position. Sensation is grossly intact. Skin: He has mild erythema and peeling scaliness over his face. He has moderately severe ecchymosis over both of his arms from chronic bruising.   Basic Metabolic Panel:  Basename 02/06/12 1233  NA 132*  K 4.0  CL 89*  CO2 32  GLUCOSE 155*  BUN 51*  CREATININE 2.44*  CALCIUM 10.9*  MG --  PHOS --   Liver Function Tests:  Basename 02/06/12 1233  AST 12   ALT 12  ALKPHOS 83  BILITOT 0.6  PROT 8.0  ALBUMIN 4.3   No results found for this basename: LIPASE:2,AMYLASE:2 in the last 72 hours No results found for this basename: AMMONIA:2 in the last 72 hours CBC:  Basename 02/06/12 1233  WBC 15.3*  NEUTROABS --  HGB 16.9  HCT 49.0  MCV 89.1  PLT 103*   Cardiac Enzymes: No results found for this basename: CKTOTAL:3,CKMB:3,CKMBINDEX:3,TROPONINI:3 in the last 72 hours BNP: No results found for this basename: PROBNP:3 in the last 72 hours D-Dimer: No results found for this basename: DDIMER:2 in the last 72 hours CBG: No results found for this basename: GLUCAP:6 in the last 72 hours Hemoglobin A1C: No results found for this basename: HGBA1C in the last 72 hours Fasting Lipid Panel: No results found for this basename: CHOL,HDL,LDLCALC,TRIG,CHOLHDL,LDLDIRECT in the last 72 hours Thyroid Function Tests: No results found for this basename: TSH,T4TOTAL,FREET4,T3FREE,THYROIDAB in the last 72 hours Anemia Panel: No results found for this basename: VITAMINB12,FOLATE,FERRITIN,TIBC,IRON,RETICCTPCT in the last 72 hours Coagulation:  Basename 02/06/12 1439  LABPROT 36.7*  INR 3.63*   Urine Drug Screen: Drugs of Abuse  No results found for this basename: labopia, cocainscrnur, labbenz, amphetmu, thcu, labbarb    Alcohol Level: No results found for this basename: ETH:2 in the last 72 hours Urinalysis:  Basename 02/06/12 1450  COLORURINE YELLOW  LABSPEC 1.020  PHURINE 5.5  GLUCOSEU NEGATIVE  HGBUR SMALL*  BILIRUBINUR NEGATIVE  KETONESUR NEGATIVE  PROTEINUR NEGATIVE  UROBILINOGEN 0.2  NITRITE NEGATIVE  LEUKOCYTESUR NEGATIVE   Misc. Labs:   EKG: Atrial fibrillation with a heart rate of 105 beats per minute and nonspecific ST abnormalities.  No results found for this or any previous visit (from the past 240 hour(s)).   Results for orders placed during the hospital encounter of 02/06/12 (from the past 48 hour(s))  CBC      Status: Abnormal   Collection Time   02/06/12 12:33 PM      Component Value Range Comment   WBC 15.3 (*) 4.0 - 10.5 (K/uL)    RBC 5.50  4.22 - 5.81 (MIL/uL)    Hemoglobin 16.9  13.0 - 17.0 (g/dL)    HCT 16.1  09.6 - 04.5 (%)    MCV 89.1  78.0 - 100.0 (fL)    MCH 30.7  26.0 - 34.0 (pg)    MCHC 34.5  30.0 - 36.0 (g/dL)    RDW 40.9  81.1 - 91.4 (%)    Platelets 103 (*) 150 - 400 (K/uL)   COMPREHENSIVE METABOLIC PANEL     Status: Abnormal   Collection Time   02/06/12 12:33 PM      Component Value Range Comment   Sodium 132 (*) 135 - 145 (mEq/L)    Potassium 4.0  3.5 - 5.1 (mEq/L)    Chloride 89 (*) 96 - 112 (mEq/L)    CO2 32  19 - 32 (mEq/L)    Glucose, Bld 155 (*) 70 - 99 (mg/dL)    BUN 51 (*) 6 - 23 (mg/dL)    Creatinine, Ser 7.82 (*) 0.50 - 1.35 (mg/dL)    Calcium 95.6 (*) 8.4 - 10.5 (mg/dL)    Total Protein 8.0  6.0 - 8.3 (g/dL)    Albumin 4.3  3.5 - 5.2 (g/dL)    AST 12  0 - 37 (U/L)    ALT 12  0 - 53 (U/L)    Alkaline Phosphatase 83  39 - 117 (U/L)    Total Bilirubin 0.6  0.3 - 1.2 (mg/dL)    GFR calc non Af Amer 25 (*) >90 (mL/min)    GFR calc Af Amer 29 (*) >90 (mL/min)   PROTIME-INR     Status: Abnormal   Collection Time   02/06/12  2:39 PM      Component Value Range Comment   Prothrombin Time 36.7 (*) 11.6 - 15.2 (seconds)    INR 3.63 (*) 0.00 - 1.49    URINALYSIS, ROUTINE W REFLEX MICROSCOPIC     Status: Abnormal   Collection Time   02/06/12  2:50 PM      Component Value Range Comment   Color, Urine YELLOW  YELLOW     APPearance CLEAR  CLEAR     Specific Gravity, Urine 1.020  1.005 - 1.030     pH 5.5  5.0 - 8.0     Glucose, UA NEGATIVE  NEGATIVE (mg/dL)    Hgb urine dipstick SMALL (*) NEGATIVE     Bilirubin Urine NEGATIVE  NEGATIVE     Ketones, ur NEGATIVE  NEGATIVE (mg/dL)  Protein, ur NEGATIVE  NEGATIVE (mg/dL)    Urobilinogen, UA 0.2  0.0 - 1.0 (mg/dL)    Nitrite NEGATIVE  NEGATIVE     Leukocytes, UA NEGATIVE  NEGATIVE    URINE MICROSCOPIC-ADD ON      Status: Abnormal   Collection Time   02/06/12  2:50 PM      Component Value Range Comment   WBC, UA 7-10  <3 (WBC/hpf)    RBC / HPF 7-10  <3 (RBC/hpf)    Bacteria, UA MANY (*) RARE      Dg Chest 2 View  02/06/2012  *RADIOLOGY REPORT*  Clinical Data: Dizziness  CHEST - 2 VIEW  Comparison: 01/19/2012  Findings:  Prior median sternotomy CABG procedure.  Heart size is normal.  No pleural effusion or edema.  No airspace consolidation is identified.  Spondylosis noted within the thoracic spine.  Impression:  1.  No acute cardiopulmonary abnormalities.  Original Report Authenticated By: Rosealee Albee, M.D.   Dg Chest 2 View  01/19/2012  *RADIOLOGY REPORT*  Clinical Data: Cough and headache.  CHEST - 2 VIEW  Comparison: Chest radiograph 06/15/2011  Findings: Again noted is a prominent azygos lobe.  The patient is status post median sternotomy and CABG.  Heart size is stable.  No evidence for edema or focal airspace disease.  Degenerative changes in lower thoracic spine.  IMPRESSION: No acute chest findings.  Original Report Authenticated By: Richarda Overlie, M.D.   Ct Head Wo Contrast  02/06/2012  *RADIOLOGY REPORT*  Clinical Data: Dizziness and headaches.  Emesis  CT HEAD WITHOUT CONTRAST  Technique:  Contiguous axial images were obtained from the base of the skull through the vertex without contrast.  Comparison: None  Findings: There is patchy low density within the subcortical and periventricular white matter consistent with chronic small vessel ischemic change.  There is prominence of the sulci and ventricles consistent with brain atrophy.  There is no evidence for acute brain infarct, hemorrhage or mass.  The mastoid air cells appear clear.  There is opacification of the sphenoid sinus.  The skull appears intact.  IMPRESSION:  1.  No acute intracranial abnormalities. 2.  Chronic small vessel ischemic change. 3.  Chronic appearing sphenoid sinus opacification.  Original Report Authenticated By: Rosealee Albee, M.D.    Impression:  Active Problems:  Acute renal failure  Warfarin-induced coagulopathy  Hyponatremia  Thrombocytopenia  Vertigo  Hyperglycemia  Systolic CHF, chronic  Cerumen impaction  UTI (lower urinary tract infection)  Nausea & vomiting    1. Dizziness/possible vertigo. Etiology may be secondary to generalized dehydration and urinary tract infection. He does have cerumen impaction but I'm not sure if it is associated with dizziness.  2. Acute renal failure. Likely prerenal azotemia from dehydration. Also consider hypoperfusion from relative hypotension and interstitial nephritis from recent Septra. He takes Lasix and Aldactone chronically for treatment of systolic congestive heart failure. His creatinine etc. 2012 was 1.22. A couple of weeks ago, it was 1.66. Today it is 2.44. He looks like depleted. He may have an underlying chronic kidney disease.  3. Hyponatremia and hypercalcemia secondary to dehydration.  4. Chronic atrial fibrillation on chronic Coumadin therapy. His INR is supratherapeutic. No active bleeding.  5. Chronic systolic congestive heart failure. Stable and compensated.  6. Urinary tract infection.  7. Recent history of nausea and vomiting, now resolving.  8. Thrombocytopenia. Etiology unknown.  9. Hyperglycemia. He has no history of diabetes.   Plan:  1. Will start IV  fluid hydration with normal saline. We'll hold Lasix and Aldactone for now. Will hold diltiazem because of relative hypotension and continue Coreg. 2. Urine culture was ordered in the emergency department. He was also given 1 g of Rocephin. We'll continue Rocephin IV daily. 3. Protonix IV and as needed Zofran IV. 4. Clear liquid diet and advance as tolerated. 5. Coumadin per pharmacy. 6. For further evaluation, we'll check urine culture, TSH, hemoglobin A1c, vitamin B12 level and laboratory studies in the morning.       Amyrie Illingworth 02/06/2012, 4:46 PM

## 2012-02-06 NOTE — Progress Notes (Signed)
Patient admitted to room #334 from ED with diagnosis of Acute Renal Failure and UTI under the care of Dr. Sherrie Mustache.  Patient is alert and oriented to person, place, time and situation.  He is not accompanied by family members at this time.  He is transported by stretcher.  Order for Clear Liquid diet is in place and dietary brought a tray and Patient has excellent appetite eating 100%.  His only complaint is mild dizziness.  He denies shortness of breath, chest pain, headache or any other pain.  Patient scores as a high fall risk, and bed alarm is on, bed is in lowest position, and education has been described in detail with patient.  He is encouraged to use his call bell for any assistance including ambulating and/or toilet needs.  He states a fall within the past day or two in his bathroom at home due to a wet floor.  He sustained minor injuries including an abrasion to his left elbow and bruising about his arms bilateral.  Also noted, is a bruise to his left lower back which appears blue/greenish in color noting that it is a bruise sustained more than several days ago.  The bruise is small in size and measures 0.5"x0.5". He denies pain in that area at this time. Skin assessment of buttocks is normal without redness or excoriation.  His skin is dry on his feet, arms and legs, but no major areas of concern on his feet are visible with the exception of needing his toenails trimmed.  Patient has noted patches of dry skin areas on his face and nose.  His cheeks bilateral are reddened which he attributes to being outside working in the sun.  He is encouraged to administer an OTC sun block for any future outdoor activities. Patient is stable. Bed is in lowest position.  Bed alarm has been activated.  Call bell is within reach of patient with instructions of its use.  Will continue to monitor.

## 2012-02-06 NOTE — ED Notes (Signed)
Pt arrived via ems from home d/t dizziness and headache. Pt states he has been having sx for 2 weeks has been seen and treated 2 times in hospital with no relief.

## 2012-02-06 NOTE — ED Provider Notes (Addendum)
History   This chart was scribed for Shelda Jakes, MD by Toya Smothers. The patient was seen in room APA14/APA14. Patient's care was started at 1040.   CSN: [id number]  Arrival date & time 02/06/12  1040   First MD Initiated Contact with Patient 02/06/12 1121      Chief Complaint  Patient presents with  . Dizziness  . Headache  . Emesis    (Consider location/radiation/quality/duration/timing/severity/associated sxs/prior treatment) HPI A level 5 Caveat applies due to Patient condition. Cory Ryan is a 71 y.o. male who presents to the Emergency Department accompanied by daughter complaining of constant moderate dizziness described as room spinning and disequilibrium onset several days ago and persistent since with associated left ear pain, constant moderate HA, slight abdominal pain, nausea and vomiting. States dizziness is aggravated with standing. Patient denies diarrhea, fever, swelling in legs, chest pain, cough, neck or back pain, dysuria. Patient list PCP as Dr. Regino Schultze. Patient was seen last by Dr. Loreta Ave 2 weeks ago for dizziness, and is scheduled to see a kidney specialist.  Past Medical History  Diagnosis Date  . A-fib     On coumadin  . CHF (congestive heart failure)     Systolic. EF 40-45%, per Echo 06/2011  . Emphysema   . CAD (coronary artery disease)   . Gastric erosions 12/2005    per EGD; Dr. Jena Gauss  . Colon polyps 12/2005    per colonoscopy  . Ischemic cardiomyopathy     Echo 06/2011. EF 40-45%.  . Aortic stenosis, moderate 06/2011    per Echo.  . Mitral valve regurgitation 06/2011    Moderate per Echo.  . Pulmonary nodules 12/2005     per CT chest  . Hypothyroidism     Past Surgical History  Procedure Date  . Cabg x 3     History reviewed. No pertinent family history.  History  Substance Use Topics  . Smoking status: Never Smoker   . Smokeless tobacco: Not on file  . Alcohol Use: No      Review of Systems  Constitutional: Negative for fever  and chills.  HENT: Positive for ear pain. Negative for rhinorrhea and neck pain.   Eyes: Negative for pain.  Respiratory: Negative for cough and shortness of breath.   Cardiovascular: Negative for chest pain.  Gastrointestinal: Positive for nausea, vomiting and abdominal pain. Negative for diarrhea.  Genitourinary: Negative for dysuria.  Musculoskeletal: Negative for back pain.  Skin: Negative for rash.  Neurological: Positive for headaches. Negative for dizziness and weakness.  Psychiatric/Behavioral: Positive for decreased concentration.       Disequilibrium  ROS limited by patient condition  Allergies  Review of patient's allergies indicates no known allergies.  Home Medications   Current Outpatient Rx  Name Route Sig Dispense Refill  . CARVEDILOL 6.25 MG PO TABS Oral Take 6.25 mg by mouth 2 (two) times daily with a meal.      . DILTIAZEM HCL ER COATED BEADS 300 MG PO CP24 Oral Take 300 mg by mouth daily.      . FUROSEMIDE 80 MG PO TABS Oral Take 80 mg by mouth at bedtime.     [name] POTASSIUM CHLORIDE CRYS ER 20 MEQ PO TBCR Oral Take 20 mEq by mouth 2 (two) times daily.      [name] ROSUVASTATIN CALCIUM 5 MG PO TABS Oral Take 5 mg by mouth at bedtime.     . SPIRONOLACTONE 25 MG PO TABS Oral Take 12.5 mg by mouth  2 (two) times daily.      . SULFAMETHOXAZOLE-TMP DS 800-160 MG PO TABS Oral Take 1 tablet by mouth 2 (two) times daily. Patient completed regimen yesterday (01-21-12)    . WARFARIN SODIUM 5 MG PO TABS Oral Take 5 mg by mouth See admin instructions. Take one tablet (5mg ) on Sundays, Tuesdays, Thursdays, and Saturdays, then take one-half tablet (2.5mg ) on Mondays, Wednesdays, and Fridays      BP 108/69  Pulse 119  Temp(Src) 97.5 F (36.4 C) (Oral)  Resp 16  Ht 5\' 10"  (1.778 m)  Wt 175 lb (79.379 kg)  BMI 25.11 kg/m2  SpO2 96%  Physical Exam  Nursing note and vitals reviewed. Constitutional: He is oriented to person, place, and time. He appears well-developed and  well-nourished. No distress.  HENT:  Head: Normocephalic and atraumatic.       TM non visible on either side due to cerumen  Eyes: EOM are normal. Pupils are equal, round, and reactive to light.  Neck: Neck supple. No tracheal deviation present.  Cardiovascular: Normal rate, regular rhythm, normal heart sounds and intact distal pulses.  Exam reveals no gallop and no friction rub.   No murmur heard. Pulmonary/Chest: Effort normal. No respiratory distress.  Abdominal: Soft. Bowel sounds are normal. He exhibits no distension. There is no tenderness.  Musculoskeletal: Normal range of motion. He exhibits no edema.  Lymphadenopathy:    He has no cervical adenopathy.  Neurological: He is alert and oriented to person, place, and time. He has normal strength. No cranial nerve deficit or sensory deficit. Coordination normal.       No pronator drift  Skin: Skin is warm and dry.  Psychiatric: He has a normal mood and affect. His behavior is normal.    ED Course  Procedures (including critical care time) DIAGNOSTIC STUDIES: Oxygen Saturation is 98% on room air, normal by my interpretation.    COORDINATION OF CARE:    Labs Reviewed  CBC - Abnormal; Notable for the following:    WBC 15.3 (*)    Platelets 103 (*)    All other components within normal limits  COMPREHENSIVE METABOLIC PANEL - Abnormal; Notable for the following:    Sodium 132 (*)    Chloride 89 (*)    Glucose, Bld 155 (*)    BUN 51 (*)    Creatinine, Ser 2.44 (*)    Calcium 10.9 (*)    GFR calc non Af Amer 25 (*)    GFR calc Af Amer 29 (*)    All other components within normal limits  URINALYSIS, ROUTINE W REFLEX MICROSCOPIC - Abnormal; Notable for the following:    Hgb urine dipstick SMALL (*)    All other components within normal limits  PROTIME-INR - Abnormal; Notable for the following:    Prothrombin Time 36.7 (*)    INR 3.63 (*)    All other components within normal limits  URINE MICROSCOPIC-ADD ON - Abnormal;  Notable for the following:    Bacteria, UA MANY (*)    All other components within normal limits  URINE CULTURE   Dg Chest 2 View  02/06/2012  *RADIOLOGY REPORT*  Clinical Data: Dizziness  CHEST - 2 VIEW  Comparison: 01/19/2012  Findings:  Prior median sternotomy CABG procedure.  Heart size is normal.  No pleural effusion or edema.  No airspace consolidation is identified.  Spondylosis noted within the thoracic spine.  Impression:  1.  No acute cardiopulmonary abnormalities.  Original Report Authenticated By: Simonne Martinet.  Bradly Chris, M.D.   Ct Head Wo Contrast  02/06/2012  *RADIOLOGY REPORT*  Clinical Data: Dizziness and headaches.  Emesis  CT HEAD WITHOUT CONTRAST  Technique:  Contiguous axial images were obtained from the base of the skull through the vertex without contrast.  Comparison: None  Findings: There is patchy low density within the subcortical and periventricular white matter consistent with chronic small vessel ischemic change.  There is prominence of the sulci and ventricles consistent with brain atrophy.  There is no evidence for acute brain infarct, hemorrhage or mass.  The mastoid air cells appear clear.  There is opacification of the sphenoid sinus.  The skull appears intact.  IMPRESSION:  1.  No acute intracranial abnormalities. 2.  Chronic small vessel ischemic change. 3.  Chronic appearing sphenoid sinus opacification.  Original Report Authenticated By: Rosealee Albee, M.D.    Date: 02/06/2012  Rate: 105  Rhythm: atrial fibrillation  QRS Axis: normal  Intervals: normal  ST/T Wave abnormalities: nonspecific T wave changes  Conduction Disutrbances:none  Narrative Interpretation:   Old EKG Reviewed: unchanged EKG without significant changes compared to 06/15/2011 atrial fibrillation was present then patient's heart rate then was 69 so little up today. T wave changes with pretty much present then  Results for orders placed during the hospital encounter of 02/06/12  CBC       Component Value Range   WBC 15.3 (*) 4.0 - 10.5 (K/uL)   RBC 5.50  4.22 - 5.81 (MIL/uL)   Hemoglobin 16.9  13.0 - 17.0 (g/dL)   HCT 28.4  13.2 - 44.0 (%)   MCV 89.1  78.0 - 100.0 (fL)   MCH 30.7  26.0 - 34.0 (pg)   MCHC 34.5  30.0 - 36.0 (g/dL)   RDW 10.2  72.5 - 36.6 (%)   Platelets 103 (*) 150 - 400 (K/uL)  COMPREHENSIVE METABOLIC PANEL      Component Value Range   Sodium 132 (*) 135 - 145 (mEq/L)   Potassium 4.0  3.5 - 5.1 (mEq/L)   Chloride 89 (*) 96 - 112 (mEq/L)   CO2 32  19 - 32 (mEq/L)   Glucose, Bld 155 (*) 70 - 99 (mg/dL)   BUN 51 (*) 6 - 23 (mg/dL)   Creatinine, Ser 4.40 (*) 0.50 - 1.35 (mg/dL)   Calcium 34.7 (*) 8.4 - 10.5 (mg/dL)   Total Protein 8.0  6.0 - 8.3 (g/dL)   Albumin 4.3  3.5 - 5.2 (g/dL)   AST 12  0 - 37 (U/L)   ALT 12  0 - 53 (U/L)   Alkaline Phosphatase 83  39 - 117 (U/L)   Total Bilirubin 0.6  0.3 - 1.2 (mg/dL)   GFR calc non Af Amer 25 (*) >90 (mL/min)   GFR calc Af Amer 29 (*) >90 (mL/min)  URINALYSIS, ROUTINE W REFLEX MICROSCOPIC      Component Value Range   Color, Urine YELLOW  YELLOW    APPearance CLEAR  CLEAR    Specific Gravity, Urine 1.020  1.005 - 1.030    pH 5.5  5.0 - 8.0    Glucose, UA NEGATIVE  NEGATIVE (mg/dL)   Hgb urine dipstick SMALL (*) NEGATIVE    Bilirubin Urine NEGATIVE  NEGATIVE    Ketones, ur NEGATIVE  NEGATIVE (mg/dL)   Protein, ur NEGATIVE  NEGATIVE (mg/dL)   Urobilinogen, UA 0.2  0.0 - 1.0 (mg/dL)   Nitrite NEGATIVE  NEGATIVE    Leukocytes, UA NEGATIVE  NEGATIVE   PROTIME-INR  Component Value Range   Prothrombin Time 36.7 (*) 11.6 - 15.2 (seconds)   INR 3.63 (*) 0.00 - 1.49   URINE MICROSCOPIC-ADD ON      Component Value Range   WBC, UA 7-10  <3 (WBC/hpf)   RBC / HPF 7-10  <3 (RBC/hpf)   Bacteria, UA MANY (*) RARE      1. Renal insufficiency   2. Dizziness   3. Urinary tract infection       MDM  Patient seen on April 9 for dizziness workup without significant amounts of that time. Patient returns  today with similar complaints of persistent occasional vomiting sounds like could be a vertigo component but not clear patient may have some dementia. Labs show worsening of the renal insufficiency potassium is okay so not true renal failure he had. Patient has long-standing history of atrial fib. Patient had difficulty pain here in the emergency apartment so cath urine was obtained. Will recheck his INR and see with the urinalysis results are. Have already discussed case with Elliot Cousin from the hospitalist team on the additional results come back we'll rediscuss case with her.  Foley placed and patient only 100 cc out so not urinary retention. Suspect a component of dehydration patient has been having dizziness and feeling nauseated probably not eating very well as well and has a component of dementia. INR is still elevated but improved at 3.63. Discussed with hospitalist they will admit for IV hydration treatment of the urinary tract infection. Patient will be mid to telemetry team 2.    I personally performed the services described in this documentation, which was scribed in my presence. The recorded information has been reviewed and considered.      Shelda Jakes, MD 02/06/12 1506  Shelda Jakes, MD 02/06/12 [id number]

## 2012-02-07 ENCOUNTER — Encounter (HOSPITAL_COMMUNITY): Payer: Self-pay | Admitting: *Deleted

## 2012-02-07 LAB — COMPREHENSIVE METABOLIC PANEL
Alkaline Phosphatase: 63 U/L (ref 39–117)
BUN: 39 mg/dL — ABNORMAL HIGH (ref 6–23)
CO2: 27 mEq/L (ref 19–32)
Chloride: 98 mEq/L (ref 96–112)
Creatinine, Ser: 1.55 mg/dL — ABNORMAL HIGH (ref 0.50–1.35)
GFR calc Af Amer: 50 mL/min — ABNORMAL LOW (ref >90)
GFR calc non Af Amer: 43 mL/min — ABNORMAL LOW (ref >90)
Glucose, Bld: 103 mg/dL — ABNORMAL HIGH (ref 70–99)
Potassium: 3.4 mEq/L — ABNORMAL LOW (ref 3.5–5.1)
Total Bilirubin: 0.5 mg/dL (ref 0.3–1.2)

## 2012-02-07 LAB — CBC
MCV: 89.3 fL (ref 78.0–100.0)
Platelets: 87 10*3/uL — ABNORMAL LOW (ref 150–400)
RBC: 4.58 MIL/uL (ref 4.22–5.81)
RDW: 14.1 % (ref 11.5–15.5)
WBC: 7.5 10*3/uL (ref 4.0–10.5)

## 2012-02-07 LAB — URINE CULTURE: Colony Count: NO GROWTH

## 2012-02-07 LAB — VITAMIN B12: Vitamin B-12: 432 pg/mL (ref 211–911)

## 2012-02-07 MED ORDER — POTASSIUM CHLORIDE CRYS ER 20 MEQ PO TBCR
20.0000 meq | EXTENDED_RELEASE_TABLET | Freq: Two times a day (BID) | ORAL | Status: AC
  Administered 2012-02-07 (×2): 20 meq via ORAL
  Filled 2012-02-07 (×2): qty 1

## 2012-02-07 MED ORDER — DILTIAZEM HCL ER COATED BEADS 180 MG PO CP24
300.0000 mg | ORAL_CAPSULE | Freq: Every day | ORAL | Status: DC
  Administered 2012-02-07 – 2012-02-08 (×2): 300 mg via ORAL
  Filled 2012-02-07 (×2): qty 1

## 2012-02-07 NOTE — Progress Notes (Signed)
Subjective: The patient feels better. He is less dizzy. He has no nausea or vomiting. He denies abdominal pain. He has less burning with urination, although, he has an indwelling Foley catheter.  Objective: Vital signs in last 24 hours: Filed Vitals:   02/06/12 1722 02/06/12 1752 02/06/12 2059 02/07/12 0514  BP: 115/79 106/76  127/78  Pulse: 104 105 74 80  Temp: 97.6 F (36.4 C) 97.7 F (36.5 C)  97.8 F (36.6 C)  TempSrc: Oral   Oral  Resp: 16 16 16 16   Height:      Weight:    70.8 kg (156 lb 1.4 oz)  SpO2: 97% 98% 98% 96%   No intake or output data in the 24 hours ending 02/07/12 1007  Weight change:   Physical exam: Lungs: Clear to auscultation bilaterally. Heart: Irregular, irregular. Abdomen: Positive bowel sounds, soft, nontender, nondistended. Extremities: No pedal edema. Neurologic: He is alert and oriented x3. Cranial nerves II through XII are intact.  Lab Results: Basic Metabolic Panel:  Basename 02/07/12 0456 02/06/12 1233  NA 134* 132*  K 3.4* 4.0  CL 98 89*  CO2 27 32  GLUCOSE 103* 155*  BUN 39* 51*  CREATININE 1.55* 2.44*  CALCIUM 9.1 10.9*  MG -- --  PHOS -- --   Liver Function Tests:  Basename 02/07/12 0456 02/06/12 1233  AST 11 12  ALT 9 12  ALKPHOS 63 83  BILITOT 0.5 0.6  PROT 6.3 8.0  ALBUMIN 3.2* 4.3   No results found for this basename: LIPASE:2,AMYLASE:2 in the last 72 hours No results found for this basename: AMMONIA:2 in the last 72 hours CBC:  Basename 02/07/12 0456 02/06/12 1233  WBC 7.5 15.3*  NEUTROABS -- --  HGB 13.9 16.9  HCT 40.9 49.0  MCV 89.3 89.1  PLT 87* 103*   Cardiac Enzymes: No results found for this basename: CKTOTAL:3,CKMB:3,CKMBINDEX:3,TROPONINI:3 in the last 72 hours BNP: No results found for this basename: PROBNP:3 in the last 72 hours D-Dimer: No results found for this basename: DDIMER:2 in the last 72 hours CBG: No results found for this basename: GLUCAP:6 in the last 72 hours Hemoglobin  A1C:  Basename 02/06/12 1633  HGBA1C 5.7*   Fasting Lipid Panel: No results found for this basename: CHOL,HDL,LDLCALC,TRIG,CHOLHDL,LDLDIRECT in the last 72 hours Thyroid Function Tests:  Basename 02/06/12 1633  TSH 0.943  T4TOTAL --  FREET4 --  T3FREE --  THYROIDAB --   Anemia Panel: No results found for this basename: VITAMINB12,FOLATE,FERRITIN,TIBC,IRON,RETICCTPCT in the last 72 hours Coagulation:  Basename 02/07/12 0456 02/06/12 1439  LABPROT 41.9* 36.7*  INR 4.30* 3.63*   Urine Drug Screen: Drugs of Abuse  No results found for this basename: labopia, cocainscrnur, labbenz, amphetmu, thcu, labbarb    Alcohol Level: No results found for this basename: ETH:2 in the last 72 hours Urinalysis:  Basename 02/06/12 1450  COLORURINE YELLOW  LABSPEC 1.020  PHURINE 5.5  GLUCOSEU NEGATIVE  HGBUR SMALL*  BILIRUBINUR NEGATIVE  KETONESUR NEGATIVE  PROTEINUR NEGATIVE  UROBILINOGEN 0.2  NITRITE NEGATIVE  LEUKOCYTESUR NEGATIVE   Misc. Labs:   Micro: No results found for this or any previous visit (from the past 240 hour(s)).  Studies/Results: Dg Chest 2 View  02/06/2012  *RADIOLOGY REPORT*  Clinical Data: Dizziness  CHEST - 2 VIEW  Comparison: 01/19/2012  Findings:  Prior median sternotomy CABG procedure.  Heart size is normal.  No pleural effusion or edema.  No airspace consolidation is identified.  Spondylosis noted within the thoracic spine.  Impression:  1.  No acute cardiopulmonary abnormalities.  Original Report Authenticated By: Rosealee Albee, M.D.   Ct Head Wo Contrast  02/06/2012  *RADIOLOGY REPORT*  Clinical Data: Dizziness and headaches.  Emesis  CT HEAD WITHOUT CONTRAST  Technique:  Contiguous axial images were obtained from the base of the skull through the vertex without contrast.  Comparison: None  Findings: There is patchy low density within the subcortical and periventricular white matter consistent with chronic small vessel ischemic change.  There is  prominence of the sulci and ventricles consistent with brain atrophy.  There is no evidence for acute brain infarct, hemorrhage or mass.  The mastoid air cells appear clear.  There is opacification of the sphenoid sinus.  The skull appears intact.  IMPRESSION:  1.  No acute intracranial abnormalities. 2.  Chronic small vessel ischemic change. 3.  Chronic appearing sphenoid sinus opacification.  Original Report Authenticated By: Rosealee Albee, M.D.    Medications:  Scheduled:   . carvedilol  6.25 mg Oral BID WC  . cefTRIAXone (ROCEPHIN) IVPB 1 gram/50 mL D5W  1 g Intravenous Once  . cefTRIAXone (ROCEPHIN)  IV  1 g Intravenous Q24H  . pantoprazole (PROTONIX) IV  40 mg Intravenous Q24H  . potassium chloride  20 mEq Oral BID  . sodium chloride  250 mL Intravenous Once  . sodium chloride  3 mL Intravenous Q12H  . sodium chloride      . Warfarin - Pharmacist Dosing Inpatient   Does not apply q1800  . DISCONTD: sodium chloride   Intravenous STAT  . DISCONTD: carvedilol  3.125 mg Oral BID WC   Continuous:   . sodium chloride 1,000 mL (02/07/12 0532)  . DISCONTD: sodium chloride     YNW:[id number], acetaminophen, albuterol, alum & mag hydroxide-simeth, meclizine, morphine, ondansetron (ZOFRAN) IV, ondansetron, oxyCODONE  Assessment: Active Problems:  Acute renal failure  Warfarin-induced coagulopathy  Hyponatremia  Thrombocytopenia  Vertigo  Hyperglycemia  Systolic CHF, chronic  Cerumen impaction  UTI (lower urinary tract infection)  Nausea & vomiting  Chronic sinusitis   1. Dizziness/vertigo. When necessary meclizine was ordered on admission. He is less symptomatic. It is likely his symptomatology was secondary to generalized dehydration and possibly urinary tract infection.  Urinary tract infection. He is on Rocephin. Urine culture is pending.  Acute renal failure. This is likely prerenal azotemia from dehydration. His renal function (BUN and creatinine) has improved  significantly overnight with hydration.  Hyponatremia and hypocalcemia secondary to dehydration. Resolving/resolved.  Mild hypokalemia.  Chronic atrial fibrillation with warfarin coagulopathy. His INR is greater than 4, however, there is no obvious GU or GI bleeding. His rate is controlled.  Chronic systolic congestive heart failure. Currently still compensated. Lasix and Aldactone are on hold temporarily.  Thrombocytopenia. Etiology unknown. His thyroid function is within normal limits. Vitamin B12 level is pending.  Plan:  1. Continue to hold Lasix and Aldactone for now. We'll decrease the frequency of IV fluids. Will replete potassium chloride modestly. 2. We'll advance his diet to a heart healthy diet. 3. We'll discontinue Foley catheter. 4. Last the nursing staff to ambulate the patient. 5. We'll followup on the results of the vitamin B12 level and other laboratory studies. 6. Hold Coumadin and continue to follow daily INR per pharmacy.    LOS: 1 day   Dorthy Hustead 02/07/2012, 10:07 AM

## 2012-02-07 NOTE — Progress Notes (Signed)
02/07/12 1647 Patient ambulated in hallway from his room to nurses station and back with nurse supervision, tolerated well.

## 2012-02-07 NOTE — Consult Note (Signed)
ANTICOAGULATION CONSULT NOTE   Pharmacy Consult for Warfarin Indication: atrial fibrillation  No Known Allergies  Patient Measurements: Height: 5\' 10"  (177.8 cm) Weight: 156 lb 1.4 oz (70.8 kg) IBW/kg (Calculated) : 73   Vital Signs: Temp: 97.8 F (36.6 C) (04/28 0514) Temp src: Oral (04/28 0514) BP: 127/78 mmHg (04/28 0514) Pulse Rate: 80  (04/28 0514)  Labs:  Basename 02/07/12 0456 02/06/12 1439 02/06/12 1233  HGB 13.9 -- 16.9  HCT 40.9 -- 49.0  PLT 87* -- 103*  APTT -- -- --  LABPROT 41.9* 36.7* --  INR 4.30* 3.63* --  HEPARINUNFRC -- -- --  CREATININE 1.55* -- 2.44*  CKTOTAL -- -- --  CKMB -- -- --  TROPONINI -- -- --   Estimated Creatinine Clearance: 43.1 ml/min (by C-G formula based on Cr of 1.55).  Medical History: Past Medical History  Diagnosis Date  . A-fib     On coumadin  . CHF (congestive heart failure)     Systolic. EF 40-45%, per Echo 06/2011  . Emphysema   . CAD (coronary artery disease)   . Gastric erosions 12/2005    per EGD; Dr. Jena Gauss  . Colon polyps 12/2005    per colonoscopy  . Ischemic cardiomyopathy     Echo 06/2011. EF 40-45%.  . Aortic stenosis, moderate 06/2011    per Echo.  . Mitral valve regurgitation 06/2011    Moderate per Echo.  . Pulmonary nodules 12/2005     per CT chest  . Hypothyroidism   . Psoriasis    Medications:  Prescriptions prior to admission  Medication Sig Dispense Refill  . carvedilol (COREG) 6.25 MG tablet Take 6.25 mg by mouth 2 (two) times daily with a meal.        . diltiazem (CARDIZEM CD) 300 MG 24 hr capsule Take 300 mg by mouth daily.        . furosemide (LASIX) 80 MG tablet Take 80 mg by mouth at bedtime.       . potassium chloride SA (K-DUR,KLOR-CON) 20 MEQ tablet Take 20 mEq by mouth 2 (two) times daily.        [name] spironolactone (ALDACTONE) 25 MG tablet Take 12.5 mg by mouth 2 (two) times daily.        [name] warfarin (COUMADIN) 5 MG tablet Take 5 mg by mouth See admin instructions. Take one tablet (5mg ) on  Sundays, Tuesdays, Thursdays, and Saturdays, then take one-half tablet (2.5mg ) on Mondays, Wednesdays, and Fridays       Assessment: INR supra-therapeutic Goal of Therapy:  INR 2-3   Plan: No Coumadin today INR daily  Valrie Hart A 02/07/2012,8:14 AM

## 2012-02-07 NOTE — Progress Notes (Signed)
02/07/12 1852 Notified Dr Sherrie Mustache of patient tachy in 120s-130s this evening during ambulation and then again at rest. Stated would reorder diltiazem as patient takes at home. Patient denies pain or other discomfort at this time. Nursing to monitor.

## 2012-02-07 NOTE — Progress Notes (Signed)
02/07/12 1214 Notified Dr Sherrie Mustache this morning of patient INR of 4.3. Stated okay, no new orders received.

## 2012-02-08 LAB — PROTIME-INR
INR: 2.75 — ABNORMAL HIGH (ref 0.00–1.49)
Prothrombin Time: 29.5 seconds — ABNORMAL HIGH (ref 11.6–15.2)

## 2012-02-08 LAB — BASIC METABOLIC PANEL
CO2: 29 mEq/L (ref 19–32)
Calcium: 9.2 mg/dL (ref 8.4–10.5)
Creatinine, Ser: 1.3 mg/dL (ref 0.50–1.35)
GFR calc non Af Amer: 53 mL/min — ABNORMAL LOW (ref >90)

## 2012-02-08 LAB — CBC
MCH: 29.8 pg (ref 26.0–34.0)
MCV: 90.4 fL (ref 78.0–100.0)
Platelets: 79 10*3/uL — ABNORMAL LOW (ref 150–400)
RDW: 14.2 % (ref 11.5–15.5)
WBC: 9 10*3/uL (ref 4.0–10.5)

## 2012-02-08 MED ORDER — WARFARIN SODIUM 5 MG PO TABS: ORAL_TABLET | ORAL | Status: DC

## 2012-02-08 MED ORDER — CEFUROXIME AXETIL 250 MG PO TABS: 250.0000 mg | ORAL_TABLET | Freq: Two times a day (BID) | ORAL | Status: DC

## 2012-02-08 MED ORDER — CEFUROXIME AXETIL 250 MG PO TABS: 250.0000 mg | ORAL_TABLET | Freq: Two times a day (BID) | ORAL | Status: AC

## 2012-02-08 MED ORDER — PANTOPRAZOLE SODIUM 40 MG PO TBEC
40.0000 mg | DELAYED_RELEASE_TABLET | Freq: Every day | ORAL | Status: DC
  Filled 2012-02-08: qty 1

## 2012-02-08 MED ORDER — FUROSEMIDE 80 MG PO TABS
80.0000 mg | ORAL_TABLET | Freq: Every day | ORAL | Status: DC
  Administered 2012-02-08: 80 mg via ORAL
  Filled 2012-02-08: qty 1

## 2012-02-08 MED ORDER — POTASSIUM CHLORIDE CRYS ER 20 MEQ PO TBCR: 10.0000 meq | EXTENDED_RELEASE_TABLET | Freq: Two times a day (BID) | ORAL | Status: DC

## 2012-02-08 MED ORDER — WARFARIN SODIUM 2 MG PO TABS: 4.0000 mg | ORAL_TABLET | Freq: Once | ORAL | Status: DC

## 2012-02-08 NOTE — Discharge Summary (Signed)
Physician Discharge Summary  Cory Ryan MRN: [id number] DOB/AGE: 11-26-1940 71 y.o.  PCP: Kirk Ruths, MD, MD   Admit date: 02/06/2012 Discharge date: 02/08/2012  Discharge Diagnoses:  1. NAUSEA, VOMITING, DIZZINESS, AND GENERALIZED MALAISE, SECONDARY TO DEHYDRATION AND ACUTE RENAL FAILURE. 2. Urinary tract infection, culture negative. 3. Acute renal failure, secondary to prerenal azotemia. The patient's creatinine was 2.44 on admission and 1.30 at the time of discharge. 4. Thrombocytopenia of unknown etiology. (THE PATIENT WILL NEED TO HAVE HIS PLATELET COUNT AND INR RECHECKED IN ONE WEEK OR LESS.) His platelet count was 79,000 at the time of discharge. His TSH was within normal limits at 0.9 and his vitamin B12 level was within normal limits at 432. 5. Chronic atrial fibrillation. 6. Warfarin-induced coagulopathy. The patient's INR was 3.6 on admission and 2.75 at the time of discharge. 7. Chronic systolic congestive heart failure. Clinically stable and compensated. His ejection fraction was 40-45% per 2-D echocardiogram in September 2012. 8. Hyponatremia secondary to dehydration and hypovolemia. 9. Chronic sinusitis, per CT scan.    Medication List  As of 02/08/2012 10:57 AM   STOP taking these medications         spironolactone 25 MG tablet         TAKE these medications         carvedilol 6.25 MG tablet   Commonly known as: COREG   Take 6.25 mg by mouth 2 (two) times daily with a meal.      cefUROXime 250 MG tablet   Commonly known as: CEFTIN   Take 1 tablet (250 mg total) by mouth 2 (two) times daily. Antibiotic to be taken for 5 more days.      diltiazem 300 MG 24 hr capsule   Commonly known as: CARDIZEM CD   Take 300 mg by mouth daily.      furosemide 80 MG tablet   Commonly known as: LASIX   Take 80 mg by mouth at bedtime.      potassium chloride SA 20 MEQ tablet   Commonly known as: K-DUR,KLOR-CON   Take 0.5 tablets (10 mEq total) by mouth 2  (two) times daily.      warfarin 5 MG tablet   Commonly known as: COUMADIN   DOSING HAS BEEN CHANGED TO :Take one-half tablet (2.5mg ) on Sundays, Tuesdays, Thursdays, and Saturdays, then take one tablet (5mg ) on Mondays, Wednesdays, and Fridays            Discharge Condition: Improved and stable.  Disposition: 01-Home or Self Care   Consults: None.   Significant Diagnostic Studies: Dg Chest 2 View  02/06/2012  *RADIOLOGY REPORT*  Clinical Data: Dizziness  CHEST - 2 VIEW  Comparison: 01/19/2012  Findings:  Prior median sternotomy CABG procedure.  Heart size is normal.  No pleural effusion or edema.  No airspace consolidation is identified.  Spondylosis noted within the thoracic spine.  Impression:  1.  No acute cardiopulmonary abnormalities.  Original Report Authenticated By: Rosealee Albee, M.D.   Dg Chest 2 View  01/19/2012  *RADIOLOGY REPORT*  Clinical Data: Cough and headache.  CHEST - 2 VIEW  Comparison: Chest radiograph 06/15/2011  Findings: Again noted is a prominent azygos lobe.  The patient is status post median sternotomy and CABG.  Heart size is stable.  No evidence for edema or focal airspace disease.  Degenerative changes in lower thoracic spine.  IMPRESSION: No acute chest findings.  Original Report Authenticated By: Richarda Overlie, M.D.   Ct  Head Wo Contrast  02/06/2012  *RADIOLOGY REPORT*  Clinical Data: Dizziness and headaches.  Emesis  CT HEAD WITHOUT CONTRAST  Technique:  Contiguous axial images were obtained from the base of the skull through the vertex without contrast.  Comparison: None  Findings: There is patchy low density within the subcortical and periventricular white matter consistent with chronic small vessel ischemic change.  There is prominence of the sulci and ventricles consistent with brain atrophy.  There is no evidence for acute brain infarct, hemorrhage or mass.  The mastoid air cells appear clear.  There is opacification of the sphenoid sinus.  The skull  appears intact.  IMPRESSION:  1.  No acute intracranial abnormalities. 2.  Chronic small vessel ischemic change. 3.  Chronic appearing sphenoid sinus opacification.  Original Report Authenticated By: Rosealee Albee, M.D.     Microbiology: Recent Results (from the past 240 hour(s))  URINE CULTURE     Status: Normal   Collection Time   02/06/12  4:10 PM      Component Value Range Status Comment   Specimen Description URINE, CATHETERIZED   Final    Special Requests NONE   Final    Culture  Setup Time [id number]   Final    Colony Count NO GROWTH   Final    Culture NO GROWTH   Final    Report Status 02/07/2012 FINAL   Final      Labs: Results for orders placed during the hospital encounter of 02/06/12 (from the past 48 hour(s))  CBC     Status: Abnormal   Collection Time   02/06/12 12:33 PM      Component Value Range Comment   WBC 15.3 (*) 4.0 - 10.5 (K/uL)    RBC 5.50  4.22 - 5.81 (MIL/uL)    Hemoglobin 16.9  13.0 - 17.0 (g/dL)    HCT 81.1  91.4 - 78.2 (%)    MCV 89.1  78.0 - 100.0 (fL)    MCH 30.7  26.0 - 34.0 (pg)    MCHC 34.5  30.0 - 36.0 (g/dL)    RDW 95.6  21.3 - 08.6 (%)    Platelets 103 (*) 150 - 400 (K/uL)   COMPREHENSIVE METABOLIC PANEL     Status: Abnormal   Collection Time   02/06/12 12:33 PM      Component Value Range Comment   Sodium 132 (*) 135 - 145 (mEq/L)    Potassium 4.0  3.5 - 5.1 (mEq/L)    Chloride 89 (*) 96 - 112 (mEq/L)    CO2 32  19 - 32 (mEq/L)    Glucose, Bld 155 (*) 70 - 99 (mg/dL)    BUN 51 (*) 6 - 23 (mg/dL)    Creatinine, Ser 5.78 (*) 0.50 - 1.35 (mg/dL)    Calcium 46.9 (*) 8.4 - 10.5 (mg/dL)    Total Protein 8.0  6.0 - 8.3 (g/dL)    Albumin 4.3  3.5 - 5.2 (g/dL)    AST 12  0 - 37 (U/L)    ALT 12  0 - 53 (U/L)    Alkaline Phosphatase 83  39 - 117 (U/L)    Total Bilirubin 0.6  0.3 - 1.2 (mg/dL)    GFR calc non Af Amer 25 (*) >90 (mL/min)    GFR calc Af Amer 29 (*) >90 (mL/min)   PROTIME-INR     Status: Abnormal   Collection Time    02/06/12  2:39 PM      Component Value  Range Comment   Prothrombin Time 36.7 (*) 11.6 - 15.2 (seconds)    INR 3.63 (*) 0.00 - 1.49    URINALYSIS, ROUTINE W REFLEX MICROSCOPIC     Status: Abnormal   Collection Time   02/06/12  2:50 PM      Component Value Range Comment   Color, Urine YELLOW  YELLOW     APPearance CLEAR  CLEAR     Specific Gravity, Urine 1.020  1.005 - 1.030     pH 5.5  5.0 - 8.0     Glucose, UA NEGATIVE  NEGATIVE (mg/dL)    Hgb urine dipstick SMALL (*) NEGATIVE     Bilirubin Urine NEGATIVE  NEGATIVE     Ketones, ur NEGATIVE  NEGATIVE (mg/dL)    Protein, ur NEGATIVE  NEGATIVE (mg/dL)    Urobilinogen, UA 0.2  0.0 - 1.0 (mg/dL)    Nitrite NEGATIVE  NEGATIVE     Leukocytes, UA NEGATIVE  NEGATIVE    URINE MICROSCOPIC-ADD ON     Status: Abnormal   Collection Time   02/06/12  2:50 PM      Component Value Range Comment   WBC, UA 7-10  <3 (WBC/hpf)    RBC / HPF 7-10  <3 (RBC/hpf)    Bacteria, UA MANY (*) RARE    URINE CULTURE     Status: Normal   Collection Time   02/06/12  4:10 PM      Component Value Range Comment   Specimen Description URINE, CATHETERIZED      Special Requests NONE      Culture  Setup Time [id number]      Colony Count NO GROWTH      Culture NO GROWTH      Report Status 02/07/2012 FINAL     HEMOGLOBIN A1C     Status: Abnormal   Collection Time   02/06/12  4:33 PM      Component Value Range Comment   Hemoglobin A1C 5.7 (*) <5.7 (%)    Mean Plasma Glucose 117 (*) <117 (mg/dL)   TSH     Status: Normal   Collection Time   02/06/12  4:33 PM      Component Value Range Comment   TSH 0.943  0.350 - 4.500 (uIU/mL)   PROTIME-INR     Status: Abnormal   Collection Time   02/07/12  4:56 AM      Component Value Range Comment   Prothrombin Time 41.9 (*) 11.6 - 15.2 (seconds)    INR 4.30 (*) 0.00 - 1.49    COMPREHENSIVE METABOLIC PANEL     Status: Abnormal   Collection Time   02/07/12  4:56 AM      Component Value Range Comment   Sodium 134 (*) 135 - 145  (mEq/L)    Potassium 3.4 (*) 3.5 - 5.1 (mEq/L)    Chloride 98  96 - 112 (mEq/L)    CO2 27  19 - 32 (mEq/L)    Glucose, Bld 103 (*) 70 - 99 (mg/dL)    BUN 39 (*) 6 - 23 (mg/dL)    Creatinine, Ser 1.47 (*) 0.50 - 1.35 (mg/dL)    Calcium 9.1  8.4 - 10.5 (mg/dL)    Total Protein 6.3  6.0 - 8.3 (g/dL)    Albumin 3.2 (*) 3.5 - 5.2 (g/dL)    AST 11  0 - 37 (U/L)    ALT 9  0 - 53 (U/L)    Alkaline Phosphatase 63  39 - 117 (U/L)  Total Bilirubin 0.5  0.3 - 1.2 (mg/dL)    GFR calc non Af Amer 43 (*) >90 (mL/min)    GFR calc Af Amer 50 (*) >90 (mL/min)   CBC     Status: Abnormal   Collection Time   02/07/12  4:56 AM      Component Value Range Comment   WBC 7.5  4.0 - 10.5 (K/uL)    RBC 4.58  4.22 - 5.81 (MIL/uL)    Hemoglobin 13.9  13.0 - 17.0 (g/dL)    HCT 65.7  84.6 - 96.2 (%)    MCV 89.3  78.0 - 100.0 (fL)    MCH 30.3  26.0 - 34.0 (pg)    MCHC 34.0  30.0 - 36.0 (g/dL)    RDW 95.2  84.1 - 32.4 (%)    Platelets 87 (*) 150 - 400 (K/uL)   VITAMIN B12     Status: Normal   Collection Time   02/07/12  4:56 AM      Component Value Range Comment   Vitamin B-12 432  211 - 911 (pg/mL)   PROTIME-INR     Status: Abnormal   Collection Time   02/08/12  5:34 AM      Component Value Range Comment   Prothrombin Time 29.5 (*) 11.6 - 15.2 (seconds)    INR 2.75 (*) 0.00 - 1.49    BASIC METABOLIC PANEL     Status: Abnormal   Collection Time   02/08/12  5:34 AM      Component Value Range Comment   Sodium 138  135 - 145 (mEq/L)    Potassium 4.6  3.5 - 5.1 (mEq/L) DELTA CHECK NOTED   Chloride 103  96 - 112 (mEq/L)    CO2 29  19 - 32 (mEq/L)    Glucose, Bld 110 (*) 70 - 99 (mg/dL)    BUN 23  6 - 23 (mg/dL)    Creatinine, Ser 4.01  0.50 - 1.35 (mg/dL)    Calcium 9.2  8.4 - 10.5 (mg/dL)    GFR calc non Af Amer 53 (*) >90 (mL/min)    GFR calc Af Amer 62 (*) >90 (mL/min)   CBC     Status: Abnormal   Collection Time   02/08/12  5:34 AM      Component Value Range Comment   WBC 9.0  4.0 - 10.5 (K/uL)      RBC 4.67  4.22 - 5.81 (MIL/uL)    Hemoglobin 13.9  13.0 - 17.0 (g/dL)    HCT 02.7  25.3 - 66.4 (%)    MCV 90.4  78.0 - 100.0 (fL)    MCH 29.8  26.0 - 34.0 (pg)    MCHC 32.9  30.0 - 36.0 (g/dL)    RDW 40.3  47.4 - 25.9 (%)    Platelets 79 (*) 150 - 400 (K/uL)      HPI : The patient is a 71 year old man with a past medical history significant for ischemic cardiomyopathy, chronic systolic congestive heart failure, chronic atrial fibrillation, and chronic warfarin anticoagulation, who presented to the emergency department on February 06 2012 with a chief complaint of dizziness and generalized malaise/weakness. Over the past couple of weeks, he had been treated for a viral infection, a diarrheal infection, and an upper respiratory infection. He also had a couple of episodes of vomiting over the past couple of weeks.  He was treated supportively and also with Septra by his primary care physician for laryngitis. In the emergency department,  he was noted to be hemodynamically stable and afebrile, however, his blood pressure was on the lower end of normal. His chest x-ray revealed no acute cardiopulmonary disease. CT scan of his head revealed no acute intracranial findings, but with chronic ischemic changes and a chronic appearing sphenoid sinus opacification. His lab data were significant for a serum sodium of 132, creatinine of 2.44, calcium of 10.9, glucose 155, WBC of 15.3, and a platelet count of 103. His INR was 3.63. His urinalysis was suggestive of infection. He was admitted for further evaluation and management.  HOSPITAL COURSE: The patient appeared subjectively and objectively volume depleted. His BUN and creatinine were higher than baseline. His serum calcium was elevated. His blood pressure was low-normal despite being treated chronically with diltiazem and carvedilol. He was started on IV fluids for hydration. Spironolactone and Lasix were withheld. He was restarted on carvedilol, but diltiazem was  withheld temporarily until his blood pressure improved. It was then restarted. Coumadin dosing was, in part, monitored by the pharmacy staff. His INR did increase to 4.3, and therefore, Coumadin was withheld for one day. Of note, during a previous ED visit on January 22 2012, his INR was also supratherapeutic at 4.8. He was started on Rocephin empirically for treatment of urinary tract infection. His platelet count was low throughout the hospitalization, etiology not elucidated.  For evaluation of the thrombocytopenia, a TSH and vitamin B12 were ordered. They were both within normal limits. His platelet count will need to be followed closely in the outpatient setting. He may need a referral to hematologist if he does not improve. His thrombocytopenia was noted during the first week in April during an ED visit. His glucose was intermittently mildly elevated. His hemoglobin A1c, however, was 5.7. His urine culture eventually revealed no growth.  Over the course of the hospitalization, the patient's dizziness, generalized weakness/malaise, and nausea resolved. His diet was advanced which he tolerated well. He ambulated in the hallway without dizziness or shortness of breath or chest pain prior to discharge. His renal function improved as did his blood pressure and clinical status. It was believed that the patient's symptomatology was secondary to acute prerenal azotemia and secondarily to the urinary tract infection. Lasix was eventually restarted on the day of discharge, however, spironolactone was not. He was advised to not take spironolactone until he was reevaluated by his primary care physician or primary cardiologist. The dosing of Coumadin was also decreased a little. He received Rocephin for 2-1/2 days. He was discharged on 5 more days of Ceftin. He will followup with the Coumadin clinic at [facility] cardiology as will be scheduled for later this week or early next week.    Discharge Exam:  Blood  pressure 148/76, pulse 96, temperature 97.1 F (36.2 C), temperature source Oral, resp. rate 20, height 5\' 10"  (1.778 m), weight 68.6 kg (151 lb 3.8 oz), SpO2 95.00%.  Lungs: Clear to auscultation bilaterally. Heart: Irregular, irregular. Abdomen: Positive bowel sounds, soft, nontender, nondistended. Extremities: No pedal edema.   Discharge Orders    Future Orders Please Complete By Expires   Diet - low sodium heart healthy      Increase activity slowly      Discharge instructions      Comments:   MEDICATION SPIRONOLACTONE HAS BEEN STOPPED FOR NOW. YOUR POTASSIUM DOSE HAS BEEN DECREASED. YOUR COUMADIN DOSING HAS BEEN CHANGED. YOU'LL NEED TO FOLLOWUP WITH YOUR DOCTORS AS RECOMMENDED. AVOID DEHYDRATION.      Total discharge time: 40 minutes.  Signed: Kaylianna Detert 02/08/2012, 10:57 AM

## 2012-02-08 NOTE — Progress Notes (Signed)
The patient is receiving Protonix by the intravenous route.  Based on criteria approved by the Pharmacy and Therapeutics Committee and the Medical Executive Committee, the medication is being converted to the equivalent oral dose form.  These criteria include: -No Active GI bleeding -Able to tolerate diet of full liquids (or better) or tube feeding -Able to tolerate other medications by the oral or enteral route  If you have any questions about this conversion, please contact the Pharmacy Department (ext [phone]).  Thank you.  Wayland Denis, [place] 02/08/2012 11:10 AM

## 2012-02-08 NOTE — Consult Note (Signed)
ANTICOAGULATION CONSULT NOTE   Pharmacy Consult for Warfarin Indication: atrial fibrillation  No Known Allergies  Patient Measurements: Height: 5\' 10"  (177.8 cm) Weight: 151 lb 3.8 oz (68.6 kg) IBW/kg (Calculated) : 73   Vital Signs: Temp: 97.1 F (36.2 C) (04/29 0530) Temp src: Oral (04/29 0530) BP: 148/76 mmHg (04/29 0530) Pulse Rate: 96  (04/29 0530)  Labs:  Basename 02/08/12 0534 02/07/12 0456 02/06/12 1439 02/06/12 1233  HGB 13.9 13.9 -- --  HCT 42.2 40.9 -- 49.0  PLT 79* 87* -- 103*  APTT -- -- -- --  LABPROT 29.5* 41.9* 36.7* --  INR 2.75* 4.30* 3.63* --  HEPARINUNFRC -- -- -- --  CREATININE 1.30 1.55* -- 2.44*  CKTOTAL -- -- -- --  CKMB -- -- -- --  TROPONINI -- -- -- --   Estimated Creatinine Clearance: 49.8 ml/min (by C-G formula based on Cr of 1.3).  Medical History: Past Medical History  Diagnosis Date  . A-fib     On coumadin  . CHF (congestive heart failure)     Systolic. EF 40-45%, per Echo 06/2011  . Emphysema   . CAD (coronary artery disease)   . Gastric erosions 12/2005    per EGD; Dr. Jena Gauss  . Colon polyps 12/2005    per colonoscopy  . Ischemic cardiomyopathy     Echo 06/2011. EF 40-45%.  . Aortic stenosis, moderate 06/2011    per Echo.  . Mitral valve regurgitation 06/2011    Moderate per Echo.  . Pulmonary nodules 12/2005     per CT chest  . Hypothyroidism   . Psoriasis    Medications:  Prescriptions prior to admission  Medication Sig Dispense Refill  . carvedilol (COREG) 6.25 MG tablet Take 6.25 mg by mouth 2 (two) times daily with a meal.        . diltiazem (CARDIZEM CD) 300 MG 24 hr capsule Take 300 mg by mouth daily.        . furosemide (LASIX) 80 MG tablet Take 80 mg by mouth at bedtime.       . potassium chloride SA (K-DUR,KLOR-CON) 20 MEQ tablet Take 20 mEq by mouth 2 (two) times daily.        [name] spironolactone (ALDACTONE) 25 MG tablet Take 12.5 mg by mouth 2 (two) times daily.        [name] warfarin (COUMADIN) 5 MG tablet Take 5 mg  by mouth See admin instructions. Take one tablet (5mg ) on Sundays, Tuesdays, Thursdays, and Saturdays, then take one-half tablet (2.5mg ) on Mondays, Wednesdays, and Fridays       Assessment: INR therapeutic today Thrombocytopenia, Platelets dropping (no heparin)  Goal of Therapy:  INR 2-3   Plan: Coumadin 4mg  today x 1 INR daily until stable  Drew Herman A 02/08/2012,10:31 AM

## 2012-02-08 NOTE — Progress Notes (Signed)
UR Chart Review Completed  

## 2012-02-08 NOTE — Progress Notes (Signed)
D/c instructions reviewed with patient and son.  Son verbalized questions and wanted to see MD prior to d/c.  Dr. Sherrie Mustache returned to room and answered all questions.  Verbalized understanding.  Pt dc'd to home with family.  [place], Candelaria Stagers 02/08/2012

## 2012-03-16 ENCOUNTER — Encounter (HOSPITAL_COMMUNITY): Payer: Self-pay | Admitting: Emergency Medicine

## 2012-03-16 ENCOUNTER — Emergency Department (HOSPITAL_COMMUNITY): Payer: Medicare Other

## 2012-03-16 ENCOUNTER — Emergency Department (HOSPITAL_COMMUNITY)
Admission: EM | Admit: 2012-03-16 | Discharge: 2012-03-16 | Disposition: A | Discharge status: Home / Self Care | Payer: Medicare Other | Attending: Emergency Medicine | Admitting: Emergency Medicine

## 2012-03-16 DIAGNOSIS — R600 Localized edema: Secondary | ICD-10-CM

## 2012-03-16 DIAGNOSIS — I25810 Atherosclerosis of coronary artery bypass graft(s) without angina pectoris: Secondary | ICD-10-CM | POA: Insufficient documentation

## 2012-03-16 DIAGNOSIS — R609 Edema, unspecified: Secondary | ICD-10-CM | POA: Insufficient documentation

## 2012-03-16 DIAGNOSIS — M7989 Other specified soft tissue disorders: Secondary | ICD-10-CM | POA: Insufficient documentation

## 2012-03-16 DIAGNOSIS — I4891 Unspecified atrial fibrillation: Secondary | ICD-10-CM | POA: Insufficient documentation

## 2012-03-16 DIAGNOSIS — I509 Heart failure, unspecified: Secondary | ICD-10-CM | POA: Insufficient documentation

## 2012-03-16 DIAGNOSIS — Z7901 Long term (current) use of anticoagulants: Secondary | ICD-10-CM | POA: Insufficient documentation

## 2012-03-16 DIAGNOSIS — N189 Chronic kidney disease, unspecified: Secondary | ICD-10-CM | POA: Insufficient documentation

## 2012-03-16 LAB — BASIC METABOLIC PANEL
CO2: 31 mEq/L (ref 19–32)
GFR calc non Af Amer: 46 mL/min — ABNORMAL LOW (ref >90)
Glucose, Bld: 110 mg/dL — ABNORMAL HIGH (ref 70–99)
Potassium: 4.2 mEq/L (ref 3.5–5.1)
Sodium: 141 mEq/L (ref 135–145)

## 2012-03-16 LAB — PROTIME-INR: INR: 2.44 — ABNORMAL HIGH (ref 0.00–1.49)

## 2012-03-16 NOTE — ED Provider Notes (Signed)
History     CSN: [id number]  Arrival date & time 03/16/12  1524   First MD Initiated Contact with Patient 03/16/12 1537      Chief Complaint  Patient presents with  . Leg Swelling    (Consider location/radiation/quality/duration/timing/severity/associated sxs/prior treatment) HPI Comments: 71 yo male with hx of afib on coumadin, CHF with EF 40%, moderate AS, and CKD who presents with 3 days of right leg swelling. Patient states he was previously on lasix 80 mg daily for CHF and renal insufficiency, but required hospitalization for acute kidney injury and his medications were decreased including lasix to 40 mg daily. He was at the nephrologist office today who noted R>L leg swelling and recommended he present for venous US to r/o DVT. He notes an increase in weight of about 8-10 lbs in past week. Taking coumadin daily and last INR one month ago was 2.4.    Patient denies injury to leg, bedrest, dietary changes. Also denies chest pain, dyspnea, palpitations, increased abdominal girth, rash, leg pain, decreased appetite, cough.    Past Medical History  Diagnosis Date  . A-fib     On coumadin  . CHF (congestive heart failure)     Systolic. EF 40-45%, per Echo 06/2011  . Emphysema   . CAD (coronary artery disease)   . Gastric erosions 12/2005    per EGD; Dr. Jena Gauss  . Colon polyps 12/2005    per colonoscopy  . Ischemic cardiomyopathy     Echo 06/2011. EF 40-45%.  . Aortic stenosis, moderate 06/2011    per Echo.  . Mitral valve regurgitation 06/2011    Moderate per Echo.  . Pulmonary nodules 12/2005     per CT chest  . Hypothyroidism   . Psoriasis   . Renal disorder     Past Surgical History  Procedure Date  . Cabg x 3     Family History  Problem Relation Age of Onset  . Cancer Brother   . Hypertension Other     History  Substance Use Topics  . Smoking status: Former Smoker -- 1.0 packs/day for 30 years    Types: Cigarettes    Quit date: 03/16/2002  . Smokeless tobacco:  Never Used  . Alcohol Use: No      Review of Systems  Constitutional: Negative for fever, activity change and appetite change.  Respiratory: Negative for cough and shortness of breath.   Cardiovascular: Negative for chest pain and palpitations.  Genitourinary: Negative for dysuria and difficulty urinating.  Musculoskeletal: Negative for gait problem.  Neurological: Negative for dizziness and syncope.    Allergies  Review of patient's allergies indicates no known allergies.  Home Medications   Current Outpatient Rx  Name Route Sig Dispense Refill  . CARVEDILOL 6.25 MG PO TABS Oral Take 6.25 mg by mouth 2 (two) times daily with a meal.      . DILTIAZEM HCL ER COATED BEADS 300 MG PO CP24 Oral Take 300 mg by mouth daily.      . FUROSEMIDE 80 MG PO TABS Oral Take 80 mg by mouth at bedtime.     [name] POTASSIUM CHLORIDE CRYS ER 20 MEQ PO TBCR Oral Take 0.5 tablets (10 mEq total) by mouth 2 (two) times daily.    Barron Alvine SODIUM 5 MG PO TABS  DOSING HAS BEEN CHANGED TO :Take one-half tablet (2.5mg ) on Sundays, Tuesdays, Thursdays, and Saturdays, then take one tablet (5mg ) on Mondays, Wednesdays, and Fridays      BP 139/92  Pulse 79  Temp(Src) 97.6 F (36.4 C) (Oral)  Resp 20  Ht 5\' 10"  (1.778 m)  Wt 168 lb (76.204 kg)  BMI 24.11 kg/m2  SpO2 96%  Physical Exam  Vitals reviewed. Constitutional: He is oriented to person, place, and time. He appears well-developed and well-nourished. No distress.  HENT:  Head: Normocephalic and atraumatic.  Mouth/Throat: Oropharynx is clear and moist.  Eyes: EOM are normal.  Cardiovascular: Normal rate and intact distal pulses.   No murmur heard.      Irregularly irregular.  Pulmonary/Chest: Effort normal. No respiratory distress. He has no wheezes.       Bibasilar rales noted.  Abdominal: Soft. Bowel sounds are normal. There is no tenderness. There is no rebound and no guarding.  Musculoskeletal: He exhibits edema. He exhibits no tenderness.        Right 1+ LE pitting edema to knee level, spares the foot, some associated congestion/erythema. No calf tenderness. Left leg with trace pitting edema to mid-shin level.  Symmetric warmth.  No abnormalities on feet.   Neurological: He is alert and oriented to person, place, and time. He exhibits normal muscle tone. Coordination normal.  Skin: He is not diaphoretic.  Psychiatric: He has a normal mood and affect.    ED Course  Procedures (including critical care time)  Labs Reviewed  BASIC METABOLIC PANEL - Abnormal; Notable for the following:    Glucose, Bld 110 (*)    Creatinine, Ser 1.46 (*)    GFR calc non Af Amer 46 (*)    GFR calc Af Amer 53 (*)    All other components within normal limits  PROTIME-INR - Abnormal; Notable for the following:    Prothrombin Time 26.9 (*)    INR 2.44 (*)    All other components within normal limits   US Venous Img Lower Unilateral Right  03/16/2012  *RADIOLOGY REPORT*  Clinical Data: Arrhythmia, CHF, peripheral edema  RIGHT LOWER EXTREMITY VENOUS DUPLEX ULTRASOUND  Technique:  Gray-scale sonography with graded compression, as well as color Doppler and duplex ultrasound were performed to evaluate the deep venous system of the lower extremity from the level of the common femoral vein through the popliteal and proximal calf veins. Spectral Doppler was utilized to evaluate flow at rest and with distal augmentation maneuvers.  Comparison:  None.  Findings:  Normal compressibility of the common femoral, superficial femoral, and popliteal veins is demonstrated, as well as the visualized proximal calf veins.  No filling defects to suggest DVT on grayscale or color Doppler imaging.  Doppler waveforms show normal direction of venous flow, normal respiratory phasicity and response to augmentation.  Peripheral subcutaneous edema noted.  Arterial vascular calcifications evident.  IMPRESSION: No evidence of lower extremity deep vein thrombosis.  Original Report  Authenticated By: Judie Petit. Ruel Favors, M.D.     No diagnosis found.    MDM  71 yo male with CKD, afib on coumadin, chronic CHF presents with 3 days asymmetric right>left leg swelling. On coumadin, low likelihood of DVT, but will rule out with duplex, check INR, renal function. Most likely this represents combination of heart failure, CKD and decrease in diuretic regimen. Patient states his nephrologist provided instructions to increase lasix to 40mg  BID starting today, representing his now increased volume status.    5:11 PM Negative venous doppler, noted soft tissue swelling. Since patient with stable renal function, will advise patient to proceed with diuresis planning per primary nephrologist. F/u with PCP as needed. Continue coumadin with therapeutic  INR.       Durwin Reges, MD 03/16/12 1711  Durwin Reges, MD 03/16/12 1745

## 2012-03-16 NOTE — ED Notes (Signed)
Swelling of right lower extremity

## 2012-03-16 NOTE — ED Notes (Signed)
Patient sent from doctors office to be checked for possible blood clot in right leg. Patient has +3 pitting edema in right leg. No swelling noted in left leg. Patient reports leg being sore. Reports some pain in right foot when leg is extended and flexes foot.

## 2012-03-16 NOTE — ED Provider Notes (Signed)
I have personally seen and examined the patient.  I have discussed the plan of care with the resident.  I have reviewed the documentation on PMH/FH/Soc. History.  I have reviewed the documentation of the resident and agree.  Pt well appearing, no distress, LE edema noted but otherwise well Stable for d/c with negative DVT study  Joya Gaskins, MD 03/16/12 [phone]

## 2012-03-16 NOTE — Discharge Instructions (Signed)
You do not have a blood clot in your leg. Swelling is from chronic kidney disease and heart failure. Cut back on the amount of salt and water that you eat/drink. Make changes to lasix as your renal doctor suggested.  Make sure to elevate your legs as much as possible. KEep taking coumadin at same dose. Return to care at your doctor office or return to ED if you have shortness of breath, worsening swelling, or decreased urine output.   Edema Edema is an abnormal build-up of fluids in tissues. Because this is partly dependent on gravity (water flows to the lowest place), it is more common in the leg sand thighs (lower extremities). It is also common in the looser tissues, like around the eyes. Painless swelling of the feet and ankles is common and increases as a person ages. It may affect both legs and may include the calves or even thighs. When squeezed, the fluid may move out of the affected area and may leave a dent for a few moments. CAUSES   Prolonged standing or sitting in one place for extended periods of time. Movement helps pump tissue fluid into the veins, and absence of movement prevents this, resulting in edema.   Varicose veins. The valves in the veins do not work as well as they should. This causes fluid to leak into the tissues.   Fluid and salt overload.   Injury, burn, or surgery to the leg, ankle, or foot, may damage veins and allow fluid to leak out.   Sunburn damages vessels. Leaky vessels allow fluid to go out into the sunburned tissues.   Allergies (from insect bites or stings, medications or chemicals) cause swelling by allowing vessels to become leaky.   Protein in the blood helps keep fluid in your vessels. Low protein, as in malnutrition, allows fluid to leak out.   Hormonal changes, including pregnancy and menstruation, cause fluid retention. This fluid may leak out of vessels and cause edema.   Medications that cause fluid retention. Examples are sex hormones,  blood pressure medications, steroid treatment, or anti-depressants.   Some illnesses cause edema, especially heart failure, kidney disease, or liver disease.   Surgery that cuts veins or lymph nodes, such as surgery done for the heart or for breast cancer, may result in edema.  DIAGNOSIS  Your caregiver is usually easily able to determine what is causing your swelling (edema) by simply asking what is wrong (getting a history) and examining you (doing a physical). Sometimes x-rays, EKG (electrocardiogram or heart tracing), and blood work may be done to evaluate for underlying medical illness. TREATMENT  General treatment includes:  Leg elevation (or elevation of the affected body part).   Restriction of fluid intake.   Prevention of fluid overload.   Compression of the affected body part. Compression with elastic bandages or support stockings squeezes the tissues, preventing fluid from entering and forcing it back into the blood vessels.   Diuretics (also called water pills or fluid pills) pull fluid out of your body in the form of increased urination. These are effective in reducing the swelling, but can have side effects and must be used only under your caregiver's supervision. Diuretics are appropriate only for some types of edema.  The specific treatment can be directed at any underlying causes discovered. Heart, liver, or kidney disease should be treated appropriately. HOME CARE INSTRUCTIONS   Elevate the legs (or affected body part) above the level of the heart, while lying down.   Avoid  sitting or standing still for prolonged periods of time.   Avoid putting anything directly under the knees when lying down, and do not wear constricting clothing or garters on the upper legs.   Exercising the legs causes the fluid to work back into the veins and lymphatic channels. This may help the swelling go down.   The pressure applied by elastic bandages or support stockings can help reduce  ankle swelling.   A low-salt diet may help reduce fluid retention and decrease the ankle swelling.   Take any medications exactly as prescribed.  SEEK MEDICAL CARE IF:  Your edema is not responding to recommended treatments. SEEK IMMEDIATE MEDICAL CARE IF:   You develop shortness of breath or chest pain.   You cannot breathe when you lay down; or if, while lying down, you have to get up and go to the window to get your breath.   You are having increasing swelling without relief from treatment.   You develop a fever over 102 F (38.9 C).   You develop pain or redness in the areas that are swollen.   Tell your caregiver right away if you have gained 3 lb/1.4 kg in 1 day or 5 lb/2.3 kg in a week.  MAKE SURE YOU:   Understand these instructions.   Will watch your condition.   Will get help right away if you are not doing well or get worse.  Document Released: 09/28/2005 Document Revised: 09/17/2011 Document Reviewed: 05/16/2008 [facility] Patient Information 2012 [place], [place].

## 2012-09-23 ENCOUNTER — Other Ambulatory Visit: Payer: Self-pay | Admitting: Urology

## 2012-09-23 ENCOUNTER — Ambulatory Visit (INDEPENDENT_AMBULATORY_CARE_PROVIDER_SITE_OTHER): Payer: Medicare Other | Admitting: Urology

## 2012-09-23 DIAGNOSIS — D414 Neoplasm of uncertain behavior of bladder: Secondary | ICD-10-CM

## 2012-09-23 DIAGNOSIS — N390 Urinary tract infection, site not specified: Secondary | ICD-10-CM

## 2012-09-23 DIAGNOSIS — R310 Gross hematuria: Secondary | ICD-10-CM

## 2012-09-23 DIAGNOSIS — R82998 Other abnormal findings in urine: Secondary | ICD-10-CM

## 2012-09-29 ENCOUNTER — Ambulatory Visit (HOSPITAL_COMMUNITY)
Admission: RE | Admit: 2012-09-29 | Discharge: 2012-09-29 | Disposition: A | Discharge status: Home / Self Care | Payer: Medicare Other | Source: Ambulatory Visit | Attending: Urology | Admitting: Urology

## 2012-09-29 DIAGNOSIS — I714 Abdominal aortic aneurysm, without rupture, unspecified: Secondary | ICD-10-CM | POA: Insufficient documentation

## 2012-09-29 DIAGNOSIS — R319 Hematuria, unspecified: Secondary | ICD-10-CM | POA: Insufficient documentation

## 2012-09-29 DIAGNOSIS — K5730 Diverticulosis of large intestine without perforation or abscess without bleeding: Secondary | ICD-10-CM | POA: Insufficient documentation

## 2012-09-29 DIAGNOSIS — D414 Neoplasm of uncertain behavior of bladder: Secondary | ICD-10-CM

## 2012-09-29 MED ORDER — IOHEXOL 300 MG/ML  SOLN
125.0000 mL | Freq: Once | INTRAMUSCULAR | Status: AC | PRN
  Administered 2012-09-29: 125 mL via INTRAVENOUS

## 2012-09-30 ENCOUNTER — Ambulatory Visit (INDEPENDENT_AMBULATORY_CARE_PROVIDER_SITE_OTHER): Payer: Medicare Other | Admitting: Urology

## 2012-09-30 DIAGNOSIS — N3020 Other chronic cystitis without hematuria: Secondary | ICD-10-CM

## 2012-09-30 DIAGNOSIS — N321 Vesicointestinal fistula: Secondary | ICD-10-CM

## 2012-10-17 ENCOUNTER — Other Ambulatory Visit (HOSPITAL_COMMUNITY): Payer: Self-pay | Admitting: Internal Medicine

## 2012-10-17 ENCOUNTER — Encounter: Payer: Self-pay | Admitting: *Deleted

## 2012-10-17 DIAGNOSIS — Z01818 Encounter for other preprocedural examination: Secondary | ICD-10-CM

## 2012-10-17 DIAGNOSIS — I2510 Atherosclerotic heart disease of native coronary artery without angina pectoris: Secondary | ICD-10-CM

## 2012-10-18 ENCOUNTER — Encounter (HOSPITAL_COMMUNITY)
Admission: RE | Admit: 2012-10-18 | Discharge: 2012-10-18 | Disposition: A | Discharge status: Home / Self Care | Payer: Medicare Other | Source: Ambulatory Visit | Attending: Internal Medicine | Admitting: Internal Medicine

## 2012-10-18 ENCOUNTER — Encounter (HOSPITAL_COMMUNITY): Payer: Self-pay | Admitting: Internal Medicine

## 2012-10-18 ENCOUNTER — Ambulatory Visit (HOSPITAL_COMMUNITY)
Admission: RE | Admit: 2012-10-18 | Discharge: 2012-10-18 | Disposition: A | Discharge status: Home / Self Care | Payer: Medicare Other | Source: Ambulatory Visit | Attending: Internal Medicine | Admitting: Internal Medicine

## 2012-10-18 ENCOUNTER — Encounter (HOSPITAL_COMMUNITY): Payer: Self-pay

## 2012-10-18 DIAGNOSIS — Z01810 Encounter for preprocedural cardiovascular examination: Secondary | ICD-10-CM | POA: Insufficient documentation

## 2012-10-18 DIAGNOSIS — I2510 Atherosclerotic heart disease of native coronary artery without angina pectoris: Secondary | ICD-10-CM | POA: Insufficient documentation

## 2012-10-18 DIAGNOSIS — J438 Other emphysema: Secondary | ICD-10-CM | POA: Insufficient documentation

## 2012-10-18 DIAGNOSIS — Z01818 Encounter for other preprocedural examination: Secondary | ICD-10-CM

## 2012-10-18 DIAGNOSIS — I428 Other cardiomyopathies: Secondary | ICD-10-CM | POA: Insufficient documentation

## 2012-10-18 MED ORDER — REGADENOSON 0.4 MG/5ML IV SOLN
INTRAVENOUS | Status: AC
  Administered 2012-10-18: 0.4 mg via INTRAVENOUS
  Filled 2012-10-18: qty 5

## 2012-10-18 MED ORDER — TECHNETIUM TC 99M SESTAMIBI - CARDIOLITE
10.0000 | Freq: Once | INTRAVENOUS | Status: AC | PRN
  Administered 2012-10-18: 9.2 via INTRAVENOUS

## 2012-10-18 MED ORDER — TECHNETIUM TC 99M SESTAMIBI - CARDIOLITE
30.0000 | Freq: Once | INTRAVENOUS | Status: AC | PRN
  Administered 2012-10-18: 13:00:00 26 via INTRAVENOUS

## 2012-10-18 MED ORDER — SODIUM CHLORIDE 0.9 % IJ SOLN
INTRAMUSCULAR | Status: AC
  Administered 2012-10-18: 10 mL via INTRAVENOUS
  Filled 2012-10-18: qty 10

## 2012-10-18 NOTE — CV Procedure (Signed)
Cory Ryan presented for a lexiscan NST today. He tolerated the procedure well. There were no lexiscan induced EKG changes noted. HR and blood pressure remained within normal limits. Perfusion images are to follow.  [name], MD, [facility] Attending Cardiologist [facility]

## 2012-10-18 NOTE — Progress Notes (Signed)
Stress Lab Nurses Notes - Cory Ryan  Cory Ryan 10/18/2012 Reason for doing test: CAD and Surgical Clearance Type of test: Marlane Hatcher Nurse performing test: Parke Poisson, RN Nuclear Medicine Tech: Lou Cal Echo Tech: Not Applicable MD performing test: Dr. Rennis Golden Family MD: Dr. Regino Schultze Test explained and consent signed: yes IV started: 22g jelco, Saline lock flushed, No redness or edema and Saline lock started in radiology Symptoms: Warm flushed Treatment/Intervention: None Reason test stopped: protocol completed After recovery IV was: Discontinued via X-ray tech and No redness or edema Patient to return to Nuc. Med at : 13:30 Patient discharged: Home Patient's Condition upon discharge was: stable Comments: During test BP 162/72 & HR  100.  Recovery BP 153/65 & HR 74 .  Symptoms resolved in recovery.                                                                                                                                                                                                                     [name] T

## 2012-10-20 ENCOUNTER — Encounter (HOSPITAL_COMMUNITY): Payer: Self-pay

## 2012-10-20 ENCOUNTER — Encounter (HOSPITAL_COMMUNITY)
Admission: RE | Admit: 2012-10-20 | Discharge: 2012-10-20 | Disposition: A | Discharge status: Home / Self Care | Payer: Medicare Other | Source: Ambulatory Visit | Attending: General Surgery | Admitting: General Surgery

## 2012-10-20 HISTORY — DX: Anemia, unspecified: D64.9

## 2012-10-20 HISTORY — DX: Acute myocardial infarction, unspecified: I21.9

## 2012-10-20 LAB — BASIC METABOLIC PANEL
BUN: 17 mg/dL (ref 6–23)
Creatinine, Ser: 1.27 mg/dL (ref 0.50–1.35)
GFR calc Af Amer: 63 mL/min — ABNORMAL LOW (ref >90)
GFR calc non Af Amer: 54 mL/min — ABNORMAL LOW (ref >90)
Potassium: 4.2 mEq/L (ref 3.5–5.1)

## 2012-10-20 LAB — CBC WITH DIFFERENTIAL/PLATELET
Basophils Absolute: 0 10*3/uL (ref 0.0–0.1)
Basophils Relative: 0 % (ref 0–1)
Lymphocytes Relative: 22 % (ref 12–46)
MCHC: 31.9 g/dL (ref 30.0–36.0)
Neutro Abs: 4.5 10*3/uL (ref 1.7–7.7)
Neutrophils Relative %: 67 % (ref 43–77)
Platelets: 87 10*3/uL — ABNORMAL LOW (ref 150–400)
RDW: 14.8 % (ref 11.5–15.5)
WBC: 6.7 10*3/uL (ref 4.0–10.5)

## 2012-10-20 LAB — PREPARE RBC (CROSSMATCH)

## 2012-10-20 LAB — SURGICAL PCR SCREEN: Staphylococcus aureus: NEGATIVE

## 2012-10-20 NOTE — H&P (Signed)
  NTS SOAP Note  Vital Signs:  Vitals as of: 10/13/2012: Systolic 157: Diastolic 56: Heart Rate 47: Temp 97.48F: Height 57ft 10in: Weight 164Lbs 0 Ounces: Pain Level 8: BMI 24  BMI : 23.53 kg/m2  Subjective: This 35 Years 49 Months old Male presents for diverticulosis with colovesicle fistula.  While being treated by Urologist, was found on CT scan to have diverticulosis with colovesicle fistula.  Denies fever, chills.  Does have some pneumoturia.  Last had a TCS many years ago.   Review of Symptoms:  Constitutional:unremarkable   Head:unremarkable    Eyes:unremarkable   Nose/Mouth/Throat:unremarkable Cardiovascular:  unremarkable   Respiratory:unremarkable   Gastrointestinal:  unremarkable   Genitourinary:    dysuria,frequency Musculoskeletal:unremarkable   Skin:unremarkable Hematolgic/Lymphatic:unremarkable     Allergic/Immunologic:unremarkable     Past Medical History:    Reviewed   Past Medical History  Surgical History: CABG ten years ago Medical Problems:  Heart problems, High Blood pressure, atrial fibrillation Allergies: nkda Medications: lasix, potassiumn, carvedilol, coumadin (which has been stopped)   Social History:Reviewed  Social History  Preferred Language: English Ethnicity: Not Hispanic / Latino Age: 72 Years 7 Months Marital Status:  M Alcohol:  No Recreational drug(s):  No   Smoking Status: Former smoker reviewed on 10/13/2012 Started Date: 10/12/1988 Stopped Date: 10/13/1999 Functional Status reviewed on mm/dd/yyyy ------------------------------------------------ Bathing: Normal Cooking: Normal Dressing: Normal Driving: Normal Eating: Normal Managing Meds: Normal Oral Care: Normal Shopping: Normal Toileting: Normal Transferring: Normal Walking: Normal Cognitive Status reviewed on mm/dd/yyyy ------------------------------------------------ Attention: Normal Decision Making: Normal Language:  Normal Memory: Normal Motor: Normal Perception: Normal Problem Solving: Normal Visual and Spatial: Normal   Family History:  Reviewed   Family History  Is there a family history [id number] h/o heart problems.  No family h/o colon cancer    Objective Information: General:  Well appearing, well nourished in no distress. Neck:  Supple without lymphadenopathy.  Heart:  Irregular rate and rhythym, no murmur Lungs:    CTA bilaterally, no wheezes, rhonchi, rales.  Breathing unlabored. Abdomen:Soft, NT/ND, no HSM, no masses.   deferred to procedure  Assessment:Diverticulosis, colovesicle fistula  Diagnosis &amp; Procedure:    Plan:Will need partial colectomy, preop TCS. Cleared by cardiology for procedure.  Continue off coumadin.  Patient Education:Alternative treatments to surgery were discussed with patient (and family).  Risks and benefits  of procedure including perforation were fully explained to the patient (and family) who gave informed consent. Patient/family questions were addressed.                                 This note has been electronically signed by Dalia Heading MD 10/13/2012 12:43 PM Active Diagnosis and Procedures: 562.11 Diverticulitis of colon without mention of hemorrhage   99203 - OFFICE OUTPATIENT NEW 30 MINUTES

## 2012-10-20 NOTE — Patient Instructions (Addendum)
Cory Ryan  10/20/2012   Your procedure is scheduled on:  10/26/2012  Report to [facility] at  615  AM.  Call this number if you have problems the morning of surgery: [phone]   Remember:   Do not eat food or drink liquids after midnight.   Take these medicines the morning of surgery with A SIP OF WATER: coreg   Do not wear jewelry, make-up or nail polish.  Do not wear lotions, powders, or perfumes.  Do not shave 48 hours prior to surgery. Men may shave face and neck.  Do not bring valuables to the hospital.  Contacts, dentures or bridgework may not be worn into surgery.  Leave suitcase in the car. After surgery it may be brought to your room.  For patients admitted to the hospital, checkout time is 11:00 AM the day of discharge.   Patients discharged the day of surgery will not be allowed to drive  home.  Name and phone number of your driver: family  Special Instructions: Shower using CHG 2 nights before surgery and the night before surgery.  If you shower the day of surgery use CHG.  Use special wash - you have one bottle of CHG for all showers.  You should use approximately 1/3 of the bottle for each shower.   Please read over the following fact sheets that you were given: Pain Booklet, Coughing and Deep Breathing, MRSA Information, Surgical Site Infection Prevention, Anesthesia Post-op Instructions and Care and Recovery After Surgery Open Colon Resection Colon resection is surgery to take out part or all of the large intestine (colon). It is also called a colectomy.  LET YOUR CAREGIVER KNOW ABOUT:  Any allergies.  All medicines you are taking, including:  Herbs, eyedrops, over-the-counter medicines and creams.  Blood thinners (anticoagulants), aspirin, or other drugs that could affect blood clotting.  Use of steroids (by mouth or as creams).  Previous problems with anesthetics, including local anesthetics.  Possibility of pregnancy, if this applies.  Any history  of blood clots.  Any history of bleeding or other blood problems.  Previous surgery.  Smoking history.  Any recent symptoms of colds or infections.  Other health problems. RISKS AND COMPLICATIONS  There are always risks for surgery with medicine that makes you sleep (general anesthetic). They include breathing and heart problems. However, this risk is low for people who have no other health problems. Other complications from colon resection may include:  An infection developing in the area where the surgery was done.  Problems with the incisions including:  Bleeding from an incision.  The wound reopening.  Tissues from inside the abdomen bulging through the incision (hernia).  Bleeding inside the abdomen.  Reopening of the colon where it was stitched or stapled together. This is a serious complication. Another procedure may be needed to fix the problem.  Damage to other organs in the abdomen.  A blood clot forming in a vein and traveling to the lungs.  Future blockage of the colon. BEFORE THE PROCEDURE  A medical evaluation will be done. This may include:  A physical exam.  Blood tests.  A test to check the heart's rhythm (electrocardiogram).  X-rays, such as magnetic resonance imaging (MRI). This can take pictures of the colon. An MRI uses a magnet, radio waves, and a computer to create a picture of your colon.  Talking with the person who will be in charge of the medicine during the procedure. An open  colon resection requires general anesthesia. Ask what you can expect.  Two weeks before the surgery, stop using aspirin and nonsteroidal anti-inflammatory drugs (NSAIDs) for pain relief. This includes prescription drugs and over-the-counter drugs. Also stop taking vitamin E.  If you take blood thinners, ask your caregiver when you should stop taking them.  Do not eat or drink anything for 8 to 12 hours before the surgery. Ask your caregiver if it is okay to take any  needed medicines with a sip of water.  Ask your caregiver if you need to arrive early before the procedure.  On the day of your surgery, your caregiver will need to know the last time you had anything to eat or drink. This includes water, gum, and candy.  Make arrangements in advance for someone to drive you home. PROCEDURE Colon resection can take 1 to 4 hours.  Small monitors will be put on your body. They are used to check your heart, blood pressure, and oxygen level.  You will be given an intravenous line (IV). A needle will be inserted in your arm. Medicine will be able to flow directly into your body through this needle.  You might be given a medicine to help you relax (sedative).  You will be given a general anesthetic.  A tube may be put in through your nose. It is called a nasogastric tube. It is used to remove stomach juices after surgery until the intestines start working again.  Once you are asleep, the surgeon will make an incision in the abdomen about 6 to 12 inches long.  Clamps are put on both ends of the diseased part of the colon.  The part of the intestine between the clamps is removed.  If possible, the ends of the healthy colon that remain will be stitched or stapled together.  Sometimes, a colostomy is needed. For a colostomy:  An opening (stoma) to the outside is made through the abdomen.  The end of the colon is brought through the opening. It is stitched to the skin.  A bag is attached to the opening. Waste will drain into this bag. The bag is removable.  The colostomy can be temporary or permanent. Ask your surgeon what to expect.  The incision from the colon resection will be closed with stitches or staples. AFTER THE PROCEDURE  You will stay in a recovery area until the anesthesia has worn off. Your blood pressure and pulse will be checked every so often. Then you will be taken to a hospital room.  You will continue to get fluids through the IV  for awhile. The IV will be taken out when the colon starts working again.  You will gradually go back to a normal diet.  Some pain is normal after a colon resection. Ask for pain medicine if the pain becomes too much.  You will be urged to get up and start walking after 1 or 2 days, at the most.  If you had a colostomy, your caregiver will explain how it works and what you will need to do.  Most people spend 3 to 7 days in the hospital after this surgery. Ask your caregiver what to expect. Document Released: 07/26/2009 Document Revised: 12/21/2011 Document Reviewed: 02/13/2011 [facility] Patient Information 2013 [place], [place]. PATIENT INSTRUCTIONS POST-ANESTHESIA  IMMEDIATELY FOLLOWING SURGERY:  Do not drive or operate machinery for the first twenty four hours after surgery.  Do not make any important decisions for twenty four hours after surgery or while taking  narcotic pain medications or sedatives.  If you develop intractable nausea and vomiting or a severe headache please notify your doctor immediately.  FOLLOW-UP:  Please make an appointment with your surgeon as instructed. You do not need to follow up with anesthesia unless specifically instructed to do so.  WOUND CARE INSTRUCTIONS (if applicable):  Keep a dry clean dressing on the anesthesia/puncture wound site if there is drainage.  Once the wound has quit draining you may leave it open to air.  Generally you should leave the bandage intact for twenty four hours unless there is drainage.  If the epidural site drains for more than 36-48 hours please call the anesthesia department.  QUESTIONS?:  Please feel free to call your physician or the hospital operator if you have any questions, and they will be happy to assist you.

## 2012-10-20 NOTE — H&P (Signed)
  NTS SOAP Note  Vital Signs:  Vitals as of: 10/13/2012: Systolic 157: Diastolic 56: Heart Rate 47: Temp 97.11F: Height 78ft 10in: Weight 164Lbs 0 Ounces: Pain Level 8: BMI 24  BMI : 23.53 kg/m2  Subjective: This 28 Years 72 Months old Male presents for diverticulosis with colovesicle fistula.  While being treated by Urologist, was found on CT scan to have diverticulosis with colovesicle fistula.  Denies fever, chills.  Does have some pneumoturia.  Last had a TCS many years ago.   Review of Symptoms:  Constitutional:unremarkable   Head:unremarkable    Eyes:unremarkable   Nose/Mouth/Throat:unremarkable Cardiovascular:  unremarkable   Respiratory:unremarkable   Gastrointestinal:  unremarkable   Genitourinary:    dysuria,frequency Musculoskeletal:unremarkable   Skin:unremarkable Hematolgic/Lymphatic:unremarkable     Allergic/Immunologic:unremarkable     Past Medical History:    Reviewed   Past Medical History  Surgical History: CABG ten years ago Medical Problems:  Heart problems, High Blood pressure, atrial fibrillation Allergies: nkda Medications: lasix, potassiumn, carvedilol, coumadin (which has been stopped)   Social History:Reviewed  Social History  Preferred Language: English Ethnicity: Not Hispanic / Latino Age: 72 Years 7 Months Marital Status:  M Alcohol:  No Recreational drug(s):  No   Smoking Status: Former smoker reviewed on 10/13/2012 Started Date: 10/12/1988 Stopped Date: 10/13/1999 Functional Status reviewed on mm/dd/yyyy ------------------------------------------------ Bathing: Normal Cooking: Normal Dressing: Normal Driving: Normal Eating: Normal Managing Meds: Normal Oral Care: Normal Shopping: Normal Toileting: Normal Transferring: Normal Walking: Normal Cognitive Status reviewed on mm/dd/yyyy ------------------------------------------------ Attention: Normal Decision Making: Normal Language:  Normal Memory: Normal Motor: Normal Perception: Normal Problem Solving: Normal Visual and Spatial: Normal   Family History:  Reviewed   Family History  Is there a family history NW:GNFAOZ h/o heart problems.  No family h/o colon cancer    Objective Information: General:  Well appearing, well nourished in no distress. Neck:  Supple without lymphadenopathy.  Heart:  Irregular rate and rhythym, no murmur Lungs:    CTA bilaterally, no wheezes, rhonchi, rales.  Breathing unlabored. Abdomen:Soft, NT/ND, no HSM, no masses.   deferred to procedure  Assessment:Diverticulosis, colovesicle fistula  Diagnosis &amp; Procedure:    Plan:Scheduled for partial colectomy on 10/26/12.   Patient Education:Alternative treatments to surgery were discussed with patient (and family).  Risks and benefits  of procedure including bleeding, infection, cardiopulmonary difficulties, and the possibility of a colostomy were fully explained to the patient (and family) who gave informed consent. Patient/family questions were addressed.  Follow-up:Weeks 1                                This note has been electronically signed by Dalia Heading MD 10/13/2012 12:43 PM Active Diagnosis and Procedures: 562.11 Diverticulitis of colon without mention of hemorrhage   99203 - OFFICE OUTPATIENT NEW 30 MINUTES

## 2012-10-21 ENCOUNTER — Other Ambulatory Visit (HOSPITAL_COMMUNITY): Payer: Medicare Other

## 2012-10-25 ENCOUNTER — Encounter (HOSPITAL_COMMUNITY): Payer: Self-pay | Admitting: *Deleted

## 2012-10-25 ENCOUNTER — Ambulatory Visit (HOSPITAL_COMMUNITY)
Admission: RE | Admit: 2012-10-25 | Discharge: 2012-10-25 | Disposition: A | Discharge status: Home / Self Care | Payer: Medicare Other | Source: Ambulatory Visit | Attending: General Surgery | Admitting: General Surgery

## 2012-10-25 ENCOUNTER — Encounter (HOSPITAL_COMMUNITY)
Admission: RE | Disposition: A | Discharge status: Home / Self Care | Payer: Self-pay | Source: Ambulatory Visit | Attending: General Surgery

## 2012-10-25 DIAGNOSIS — I10 Essential (primary) hypertension: Secondary | ICD-10-CM | POA: Insufficient documentation

## 2012-10-25 DIAGNOSIS — N321 Vesicointestinal fistula: Secondary | ICD-10-CM | POA: Insufficient documentation

## 2012-10-25 DIAGNOSIS — Z951 Presence of aortocoronary bypass graft: Secondary | ICD-10-CM | POA: Insufficient documentation

## 2012-10-25 DIAGNOSIS — K5730 Diverticulosis of large intestine without perforation or abscess without bleeding: Secondary | ICD-10-CM | POA: Insufficient documentation

## 2012-10-25 HISTORY — PX: FLEXIBLE SIGMOIDOSCOPY: SHX5431

## 2012-10-25 SURGERY — SIGMOIDOSCOPY, FLEXIBLE
Anesthesia: Moderate Sedation

## 2012-10-25 MED ORDER — MEPERIDINE HCL 50 MG/ML IJ SOLN
INTRAMUSCULAR | Status: AC
  Filled 2012-10-25: qty 1

## 2012-10-25 MED ORDER — SODIUM CHLORIDE 0.45 % IV SOLN
INTRAVENOUS | Status: DC
  Administered 2012-10-25: 08:00:00 via INTRAVENOUS

## 2012-10-25 MED ORDER — SPOT INK MARKER SYRINGE KIT
PACK | SUBMUCOSAL | Status: DC | PRN
  Administered 2012-10-25: 5 mL via SUBMUCOSAL

## 2012-10-25 MED ORDER — MEPERIDINE HCL 25 MG/ML IJ SOLN
INTRAMUSCULAR | Status: DC | PRN
  Administered 2012-10-25: 50 mg via INTRAVENOUS

## 2012-10-25 MED ORDER — MIDAZOLAM HCL 5 MG/5ML IJ SOLN
INTRAMUSCULAR | Status: AC
  Administered 2012-10-26: 2 mg via INTRAVENOUS
  Filled 2012-10-25: qty 10

## 2012-10-25 MED ORDER — STERILE WATER FOR IRRIGATION IR SOLN
Status: DC | PRN
  Administered 2012-10-25: 09:00:00

## 2012-10-25 MED ORDER — MIDAZOLAM HCL 5 MG/5ML IJ SOLN
INTRAMUSCULAR | Status: DC | PRN
  Administered 2012-10-25: 3 mg via INTRAVENOUS
  Administered 2012-10-25: 1 mg via INTRAVENOUS

## 2012-10-25 NOTE — Interval H&P Note (Signed)
History and Physical Interval Note:  10/25/2012 8:28 AM  Cory Ryan  has presented today for surgery, with the diagnosis of Diverticulitis   The various methods of treatment have been discussed with the patient and family. After consideration of risks, benefits and other options for treatment, the patient has consented to  Procedure(s) (LRB) with comments: COLONOSCOPY (N/A) as a surgical intervention .  The patient's history has been reviewed, patient examined, no change in status, stable for surgery.  I have reviewed the patient's chart and labs.  Questions were answered to the patient's satisfaction.     Franky Macho A

## 2012-10-25 NOTE — Op Note (Signed)
[facility] [address] [place], [address]   COLONOSCOPY PROCEDURE REPORT  PATIENT: Cory Ryan, Cory Ryan  MR#: [id number] BIRTHDATE: 02-Feb-1941 , 71  yrs. old GENDER: Male ENDOSCOPIST: Franky Macho, MD REFERRED XB:[id number], Secundino Ginger, John PROCEDURE DATE:  10/25/2012 PROCEDURE:   Flexible sigmoidoscopy, attempted colonoscopy ASA CLASS:   Class III INDICATIONS:colovesicle fistula, diverticulosis MEDICATIONS: Versed 4 mg IV and Demerol 50 mg IV  DESCRIPTION OF PROCEDURE:   After the risks benefits and alternatives of the procedure were thoroughly explained, informed consent was obtained.  A digital rectal exam revealed no abnormalities of the rectum.   The Pentax Colonoscope [id number] endoscope was introduced through the anus and advanced to the sigmoid colon. No adverse events experienced.   The quality of the prep was adequate, using MoviPrep  The instrument was then slowly withdrawn as the colon was fully examined.      COLON FINDINGS: The colonoscope was advanced to the 35 cm Jaleigha Deane, where significant edema and a masslike occlusion was noted.  The colonoscope could not be advanced past this point.  Additionally, an erythematous area was noted at 20 cm from the anus.  Both areas were marked with blue dye.  No biopsies were taken as the patient is scheduled undergo a partial colectomy tomorrow.  Retroflexed views revealed no abnormalities. The time to cecum=minutes 0 seconds.  Withdrawal time=  .  The scope was withdrawn and the procedure completed. COMPLICATIONS: There were no complications.  ENDOSCOPIC IMPRESSION: The colonoscope was advanced to the 35 cm Jennfer Gassen, where significant edema and a masslike occlusion was noted.  The colonoscope could not be advanced past this point.  Additionally, an erythematous area was noted at 20 cm from the anus.  Both areas were marked with blue dye.  No biopsies were taken as the patient is scheduled undergo a partial  colectomy tomorrow.  RECOMMENDATIONS: 1.  For partial colectomy tomorrow. 2.  For partial colectomy tomorrow.   eSigned:  Franky Macho, MD 10/25/2012 9:12 AM   cc:

## 2012-10-26 ENCOUNTER — Inpatient Hospital Stay (HOSPITAL_COMMUNITY): Payer: Medicare Other | Admitting: Anesthesiology

## 2012-10-26 ENCOUNTER — Inpatient Hospital Stay (HOSPITAL_COMMUNITY)
Admission: RE | Admit: 2012-10-26 | Discharge: 2012-10-29 | DRG: 654 | Disposition: A | Discharge status: Home Health | Payer: Medicare Other | Source: Ambulatory Visit | Attending: General Surgery | Admitting: General Surgery

## 2012-10-26 ENCOUNTER — Encounter (HOSPITAL_COMMUNITY): Payer: Self-pay | Admitting: *Deleted

## 2012-10-26 ENCOUNTER — Encounter (HOSPITAL_COMMUNITY)
Admission: RE | Disposition: A | Discharge status: Home Health | Payer: Self-pay | Source: Ambulatory Visit | Attending: General Surgery

## 2012-10-26 ENCOUNTER — Encounter (HOSPITAL_COMMUNITY): Payer: Self-pay | Admitting: Anesthesiology

## 2012-10-26 DIAGNOSIS — I10 Essential (primary) hypertension: Secondary | ICD-10-CM | POA: Diagnosis present

## 2012-10-26 DIAGNOSIS — Z87891 Personal history of nicotine dependence: Secondary | ICD-10-CM

## 2012-10-26 DIAGNOSIS — D500 Iron deficiency anemia secondary to blood loss (chronic): Secondary | ICD-10-CM | POA: Diagnosis present

## 2012-10-26 DIAGNOSIS — K632 Fistula of intestine: Secondary | ICD-10-CM

## 2012-10-26 DIAGNOSIS — K5732 Diverticulitis of large intestine without perforation or abscess without bleeding: Secondary | ICD-10-CM | POA: Diagnosis present

## 2012-10-26 DIAGNOSIS — D62 Acute posthemorrhagic anemia: Secondary | ICD-10-CM | POA: Diagnosis not present

## 2012-10-26 DIAGNOSIS — N321 Vesicointestinal fistula: Principal | ICD-10-CM | POA: Diagnosis present

## 2012-10-26 DIAGNOSIS — I4891 Unspecified atrial fibrillation: Secondary | ICD-10-CM | POA: Diagnosis present

## 2012-10-26 HISTORY — PX: PARTIAL COLECTOMY: SHX5273

## 2012-10-26 LAB — HEMOGLOBIN AND HEMATOCRIT, BLOOD: HCT: 34.8 % — ABNORMAL LOW (ref 39.0–52.0)

## 2012-10-26 SURGERY — COLECTOMY, PARTIAL
Anesthesia: General | Site: Abdomen | Wound class: Dirty or Infected

## 2012-10-26 MED ORDER — MORPHINE SULFATE 2 MG/ML IJ SOLN
2.0000 mg | INTRAMUSCULAR | Status: DC | PRN
  Administered 2012-10-26 – 2012-10-29 (×6): 2 mg via INTRAVENOUS
  Filled 2012-10-26 (×7): qty 1

## 2012-10-26 MED ORDER — ALUM & MAG HYDROXIDE-SIMETH 200-200-20 MG/5ML PO SUSP
30.0000 mL | Freq: Four times a day (QID) | ORAL | Status: DC | PRN
  Administered 2012-10-26 – 2012-10-27 (×2): 30 mL via ORAL
  Filled 2012-10-26 (×2): qty 30

## 2012-10-26 MED ORDER — ACETAMINOPHEN 10 MG/ML IV SOLN
1000.0000 mg | Freq: Four times a day (QID) | INTRAVENOUS | Status: AC
  Administered 2012-10-26 – 2012-10-27 (×4): 1000 mg via INTRAVENOUS
  Filled 2012-10-26 (×3): qty 100

## 2012-10-26 MED ORDER — ALVIMOPAN 12 MG PO CAPS
12.0000 mg | ORAL_CAPSULE | Freq: Two times a day (BID) | ORAL | Status: DC
  Administered 2012-10-27 (×2): 12 mg via ORAL
  Filled 2012-10-26 (×2): qty 1

## 2012-10-26 MED ORDER — NEOSTIGMINE METHYLSULFATE 1 MG/ML IJ SOLN
INTRAMUSCULAR | Status: AC
  Filled 2012-10-26: qty 1

## 2012-10-26 MED ORDER — ENOXAPARIN SODIUM 40 MG/0.4ML ~~LOC~~ SOLN
40.0000 mg | Freq: Once | SUBCUTANEOUS | Status: AC
  Administered 2012-10-26: 40 mg via SUBCUTANEOUS

## 2012-10-26 MED ORDER — FENTANYL CITRATE 0.05 MG/ML IJ SOLN
INTRAMUSCULAR | Status: AC
  Filled 2012-10-26: qty 2

## 2012-10-26 MED ORDER — MIDAZOLAM HCL 2 MG/2ML IJ SOLN
INTRAMUSCULAR | Status: AC
  Filled 2012-10-26: qty 2

## 2012-10-26 MED ORDER — ONDANSETRON HCL 4 MG/2ML IJ SOLN
4.0000 mg | Freq: Once | INTRAMUSCULAR | Status: AC
  Administered 2012-10-26: 4 mg via INTRAVENOUS

## 2012-10-26 MED ORDER — CEFOTETAN DISODIUM-DEXTROSE 2-2.08 GM-% IV SOLR
2.0000 g | Freq: Two times a day (BID) | INTRAVENOUS | Status: DC
  Administered 2012-10-26: 2 g via INTRAVENOUS

## 2012-10-26 MED ORDER — ENOXAPARIN SODIUM 40 MG/0.4ML ~~LOC~~ SOLN
SUBCUTANEOUS | Status: AC
  Filled 2012-10-26: qty 0.4

## 2012-10-26 MED ORDER — ONDANSETRON HCL 4 MG/2ML IJ SOLN
INTRAMUSCULAR | Status: AC
  Filled 2012-10-26: qty 2

## 2012-10-26 MED ORDER — LIDOCAINE HCL (PF) 1 % IJ SOLN
INTRAMUSCULAR | Status: AC
  Filled 2012-10-26: qty 5

## 2012-10-26 MED ORDER — CHLORHEXIDINE GLUCONATE 4 % EX LIQD
1.0000 | Freq: Once | CUTANEOUS | Status: DC
Start: 2012-10-26 — End: 2012-10-26

## 2012-10-26 MED ORDER — POVIDONE-IODINE 10 % EX OINT
TOPICAL_OINTMENT | CUTANEOUS | Status: AC
  Filled 2012-10-26: qty 2

## 2012-10-26 MED ORDER — POVIDONE-IODINE 10 % EX OINT
TOPICAL_OINTMENT | CUTANEOUS | Status: DC | PRN
  Administered 2012-10-26: 1 via TOPICAL

## 2012-10-26 MED ORDER — ROCURONIUM BROMIDE 50 MG/5ML IV SOLN
INTRAVENOUS | Status: AC
  Filled 2012-10-26: qty 1

## 2012-10-26 MED ORDER — CEFOTETAN DISODIUM-DEXTROSE 2-2.08 GM-% IV SOLR
INTRAVENOUS | Status: AC
  Filled 2012-10-26: qty 50

## 2012-10-26 MED ORDER — ONDANSETRON HCL 4 MG/2ML IJ SOLN
4.0000 mg | Freq: Four times a day (QID) | INTRAMUSCULAR | Status: DC | PRN
  Administered 2012-10-26 – 2012-10-27 (×3): 4 mg via INTRAVENOUS
  Filled 2012-10-26 (×3): qty 2

## 2012-10-26 MED ORDER — SODIUM CHLORIDE 0.9 % IV SOLN
3.0000 g | Freq: Four times a day (QID) | INTRAVENOUS | Status: AC
  Administered 2012-10-26 – 2012-10-27 (×3): 3 g via INTRAVENOUS
  Filled 2012-10-26 (×3): qty 3

## 2012-10-26 MED ORDER — HEMOSTATIC AGENTS (NO CHARGE) OPTIME
TOPICAL | Status: DC | PRN
  Administered 2012-10-26: 1 via TOPICAL

## 2012-10-26 MED ORDER — LACTATED RINGERS IV SOLN
INTRAVENOUS | Status: DC
  Administered 2012-10-26: 22:00:00 via INTRAVENOUS

## 2012-10-26 MED ORDER — ONDANSETRON HCL 4 MG/2ML IJ SOLN: 4.0000 mg | Freq: Once | INTRAMUSCULAR | Status: DC | PRN

## 2012-10-26 MED ORDER — FENTANYL CITRATE 0.05 MG/ML IJ SOLN
25.0000 ug | INTRAMUSCULAR | Status: DC | PRN
  Administered 2012-10-26 (×4): 50 ug via INTRAVENOUS

## 2012-10-26 MED ORDER — NEOSTIGMINE METHYLSULFATE 1 MG/ML IJ SOLN
INTRAMUSCULAR | Status: DC | PRN
  Administered 2012-10-26: 2 mg via INTRAVENOUS

## 2012-10-26 MED ORDER — ALVIMOPAN 12 MG PO CAPS
ORAL_CAPSULE | ORAL | Status: AC
  Filled 2012-10-26: qty 1

## 2012-10-26 MED ORDER — FENTANYL CITRATE 0.05 MG/ML IJ SOLN
INTRAMUSCULAR | Status: AC
  Filled 2012-10-26: qty 5

## 2012-10-26 MED ORDER — ROCURONIUM BROMIDE 100 MG/10ML IV SOLN
INTRAVENOUS | Status: DC | PRN
  Administered 2012-10-26: 40 mg via INTRAVENOUS
  Administered 2012-10-26 (×2): 10 mg via INTRAVENOUS

## 2012-10-26 MED ORDER — FENTANYL CITRATE 0.05 MG/ML IJ SOLN
INTRAMUSCULAR | Status: DC | PRN
  Administered 2012-10-26 (×5): 50 ug via INTRAVENOUS

## 2012-10-26 MED ORDER — MIDAZOLAM HCL 2 MG/2ML IJ SOLN
1.0000 mg | INTRAMUSCULAR | Status: DC | PRN
  Administered 2012-10-26: 2 mg via INTRAVENOUS

## 2012-10-26 MED ORDER — ENOXAPARIN SODIUM 40 MG/0.4ML ~~LOC~~ SOLN
40.0000 mg | SUBCUTANEOUS | Status: DC
  Administered 2012-10-27: 40 mg via SUBCUTANEOUS
  Filled 2012-10-26: qty 0.4

## 2012-10-26 MED ORDER — LACTATED RINGERS IV SOLN
INTRAVENOUS | Status: DC
  Administered 2012-10-26 (×3): via INTRAVENOUS

## 2012-10-26 MED ORDER — HEMOSTATIC AGENTS (NO CHARGE) OPTIME
TOPICAL | Status: DC | PRN
  Administered 2012-10-26 (×2): 1 via TOPICAL

## 2012-10-26 MED ORDER — LORAZEPAM 2 MG/ML IJ SOLN
0.5000 mg | Freq: Four times a day (QID) | INTRAMUSCULAR | Status: DC | PRN
  Administered 2012-10-26: 0.5 mg via INTRAVENOUS
  Filled 2012-10-26: qty 1

## 2012-10-26 MED ORDER — ETOMIDATE 2 MG/ML IV SOLN
INTRAVENOUS | Status: AC
  Filled 2012-10-26: qty 10

## 2012-10-26 MED ORDER — ALVIMOPAN 12 MG PO CAPS
12.0000 mg | ORAL_CAPSULE | Freq: Once | ORAL | Status: AC
  Administered 2012-10-26: 12 mg via ORAL

## 2012-10-26 MED ORDER — CARVEDILOL 3.125 MG PO TABS
6.2500 mg | ORAL_TABLET | Freq: Two times a day (BID) | ORAL | Status: DC
  Administered 2012-10-26 – 2012-10-29 (×7): 6.25 mg via ORAL
  Filled 2012-10-26 (×7): qty 2

## 2012-10-26 MED ORDER — SODIUM CHLORIDE 0.9 % IR SOLN
Status: DC | PRN
  Administered 2012-10-26 (×2): 1000 mL

## 2012-10-26 MED ORDER — PROPOFOL 10 MG/ML IV EMUL
INTRAVENOUS | Status: AC
  Filled 2012-10-26: qty 20

## 2012-10-26 MED ORDER — ACETAMINOPHEN 10 MG/ML IV SOLN
INTRAVENOUS | Status: AC
  Filled 2012-10-26: qty 100

## 2012-10-26 MED ORDER — ETOMIDATE 2 MG/ML IV SOLN
INTRAVENOUS | Status: DC | PRN
  Administered 2012-10-26: 16 mg via INTRAVENOUS

## 2012-10-26 MED ORDER — ONDANSETRON HCL 4 MG PO TABS: 4.0000 mg | ORAL_TABLET | Freq: Four times a day (QID) | ORAL | Status: DC | PRN

## 2012-10-26 MED ORDER — GLYCOPYRROLATE 0.2 MG/ML IJ SOLN
INTRAMUSCULAR | Status: DC | PRN
  Administered 2012-10-26: 0.4 mg via INTRAVENOUS

## 2012-10-26 MED ORDER — GLYCOPYRROLATE 0.2 MG/ML IJ SOLN
INTRAMUSCULAR | Status: AC
  Filled 2012-10-26: qty 2

## 2012-10-26 SURGICAL SUPPLY — 70 items
APPLIER CLIP 11 MED OPEN (CLIP)
APPLIER CLIP 13 LRG OPEN (CLIP)
APR CLP LRG 13 20 CLIP (CLIP)
APR CLP MED 11 20 MLT OPN (CLIP)
BAG HAMPER (MISCELLANEOUS) ×2 IMPLANT
BARRIER SKIN 2 3/4 (OSTOMY) IMPLANT
BARRIER SKIN OD2.25 2 3/4 FLNG (OSTOMY) IMPLANT
BRR SKN FLT 2.75X2.25 2 PC (OSTOMY)
CLAMP POUCH DRAINAGE QUIET (OSTOMY) IMPLANT
CLIP APPLIE 11 MED OPEN (CLIP) IMPLANT
CLIP APPLIE 13 LRG OPEN (CLIP) IMPLANT
CLOTH BEACON ORANGE TIMEOUT ST (SAFETY) ×2 IMPLANT
COVER LIGHT HANDLE STERIS (MISCELLANEOUS) ×4 IMPLANT
DRAPE UTILITY W/TAPE 26X15 (DRAPES) ×2 IMPLANT
DRAPE WARM FLUID 44X44 (DRAPE) ×2 IMPLANT
DURAPREP 26ML APPLICATOR (WOUND CARE) ×2 IMPLANT
ELECT BLADE 6 FLAT ULTRCLN (ELECTRODE) ×2 IMPLANT
ELECT REM PT RETURN 9FT ADLT (ELECTROSURGICAL) ×2
ELECTRODE REM PT RTRN 9FT ADLT (ELECTROSURGICAL) ×1 IMPLANT
FORMALIN 10 PREFIL 480ML (MISCELLANEOUS) IMPLANT
GLOVE BIO SURGEON STRL SZ7.5 (GLOVE) ×6 IMPLANT
GLOVE BIOGEL M 8.0 STRL (GLOVE) ×1 IMPLANT
GLOVE BIOGEL PI IND STRL 7.0 (GLOVE) IMPLANT
GLOVE BIOGEL PI INDICATOR 7.0 (GLOVE) ×4
GLOVE ECLIPSE 6.5 STRL STRAW (GLOVE) ×1 IMPLANT
GLOVE SS BIOGEL STRL SZ 6.5 (GLOVE) IMPLANT
GLOVE SUPERSENSE BIOGEL SZ 6.5 (GLOVE) ×5
GOWN STRL REIN XL XLG (GOWN DISPOSABLE) ×9 IMPLANT
HEMOSTAT SURGICEL 4X8 (HEMOSTASIS) ×4 IMPLANT
INST SET MAJOR GENERAL (KITS) ×2 IMPLANT
KIT BLADEGUARD II DBL (SET/KITS/TRAYS/PACK) ×1 IMPLANT
KIT ROOM TURNOVER APOR (KITS) ×2 IMPLANT
LIGASURE IMPACT 36 18CM CVD LR (INSTRUMENTS) ×2 IMPLANT
MANIFOLD NEPTUNE II (INSTRUMENTS) ×2 IMPLANT
NS IRRIG 1000ML POUR BTL (IV SOLUTION) ×4 IMPLANT
PACK ABDOMINAL MAJOR (CUSTOM PROCEDURE TRAY) ×2 IMPLANT
PAD ARMBOARD 7.5X6 YLW CONV (MISCELLANEOUS) ×2 IMPLANT
PENCIL HANDSWITCHING (ELECTRODE) ×1 IMPLANT
POUCH OSTOMY 2 3/4  H 3804 (WOUND CARE)
POUCH OSTOMY 2 3/4 H 3804 (WOUND CARE)
POUCH OSTOMY 2 PC DRNBL 2.25 (WOUND CARE) IMPLANT
POUCH OSTOMY 2 PC DRNBL 2.75 (WOUND CARE) IMPLANT
POUCH OSTOMY DRNBL 2 1/4 (WOUND CARE)
RELOAD LINEAR CUT PROX 55 BLUE (ENDOMECHANICALS) IMPLANT
RELOAD PROXIMATE 75MM BLUE (ENDOMECHANICALS) ×4 IMPLANT
RELOAD STAPLE 55 3.8 BLU REG (ENDOMECHANICALS) IMPLANT
RETRACTOR WND ALEXIS 25 LRG (MISCELLANEOUS) IMPLANT
RTRCTR WOUND ALEXIS 25CM LRG (MISCELLANEOUS) ×2
SET BASIN LINEN APH (SET/KITS/TRAYS/PACK) ×2 IMPLANT
SPONGE GAUZE 4X4 12PLY (GAUZE/BANDAGES/DRESSINGS) ×2 IMPLANT
SPONGE LAP 18X18 X RAY DECT (DISPOSABLE) IMPLANT
SPONGE SURGIFOAM ABS GEL 100 (HEMOSTASIS) ×2 IMPLANT
STAPLER GUN LINEAR PROX 60 (STAPLE) ×2 IMPLANT
STAPLER PROXIMATE 55 BLUE (STAPLE) IMPLANT
STAPLER PROXIMATE 75MM BLUE (STAPLE) ×1 IMPLANT
STAPLER VISISTAT (STAPLE) ×2 IMPLANT
SUCTION POOLE TIP (SUCTIONS) ×2 IMPLANT
SUT CHROMIC 0 SH (SUTURE) ×1 IMPLANT
SUT CHROMIC 2 0 SH (SUTURE) ×6 IMPLANT
SUT CHROMIC 3 0 SH 27 (SUTURE) ×1 IMPLANT
SUT NOVA NAB GS-26 0 60 (SUTURE) IMPLANT
SUT PDS AB 0 CTX 60 (SUTURE) ×2 IMPLANT
SUT SILK 2 0 (SUTURE)
SUT SILK 2 0 REEL (SUTURE) IMPLANT
SUT SILK 2-0 18XBRD TIE 12 (SUTURE) IMPLANT
SUT SILK 3 0 SH CR/8 (SUTURE) ×4 IMPLANT
TAPE CLOTH SURG 4X10 WHT LF (GAUZE/BANDAGES/DRESSINGS) ×2 IMPLANT
TOWEL BLUE STERILE X RAY DET (MISCELLANEOUS) ×2 IMPLANT
TOWEL OR 17X26 4PK STRL BLUE (TOWEL DISPOSABLE) ×2 IMPLANT
TRAY FOLEY CATH 14FR (SET/KITS/TRAYS/PACK) ×2 IMPLANT

## 2012-10-26 NOTE — Anesthesia Procedure Notes (Signed)
Procedure Name: Intubation Date/Time: 10/26/2012 7:49 AM Performed by: Franco Nones Pre-anesthesia Checklist: Patient identified, Patient being monitored, Timeout performed, Emergency Drugs available and Suction available Patient Re-evaluated:Patient Re-evaluated prior to inductionOxygen Delivery Method: Circle System Utilized Preoxygenation: Pre-oxygenation with 100% oxygen Intubation Type: IV induction Ventilation: Mask ventilation without difficulty Laryngoscope Size: Miller and 2 Grade View: Grade I Tube type: Oral Tube size: 8.0 mm Number of attempts: 1 Airway Equipment and Method: stylet Placement Confirmation: ETT inserted through vocal cords under direct vision,  positive ETCO2 and breath sounds checked- equal and bilateral Secured at: 22 cm Tube secured with: Tape Dental Injury: Teeth and Oropharynx as per pre-operative assessment

## 2012-10-26 NOTE — Op Note (Signed)
Patient:  Cory Ryan  DOB:  [birth year]  MRN:  [id number]   Preop Diagnosis:  Sigmoid diverticulitis, colovesical fistula  Postop Diagnosis:  Same  Procedure:  Partial colectomy, closure of colovesical fistula  Surgeon:  Franky Macho, M.D.  Anes:  General endotracheal  Indications:  Patient is a 72 year old white male who was found by Dr. Annabell Howells of urology to have a colovesical fistula. He underwent a colonoscopy yesterday, though the scope could only be passed to 35 cm. He now presents for a partial colectomy with closure of the colovesical fistula. The risks and benefits of the procedure including bleeding, infection, cardiopulmonary difficulties, the possibly of a colostomy, and the possibility of a blood transfusion were fully explained to the patient, who gave informed consent.  Procedure note:  The patient was placed the lithotomy position after induction of general endotracheal anesthesia. The abdomen and perineum were prepped and draped using usual sterile technique with DuraPrep. Surgical site confirmation was performed.  A lower midline incision was made down to the peritoneum. The peritoneal cavity was entered into without difficulty. The liver was palpated and noted to be within normal limits. The appendix, right colon, transverse colon, and descending colon regions were all within normal limits. No palpable masses were noted. In the mid to distal portion of the sigmoid colon, a matted mass of tissue was noted to be adhesed to the upper dome of the bladder towards the left side. The mass was freed away from the bladder wall using sharp dissection. The sigmoid colon was mobilized along the line of Toldt. Care was taken to avoid the left ureter. The dissection was taken up to the splenic flexure. The colorectal region was also mobilized posteriorly along the lateral peritoneal attachments. This was all done using the LigaSure. A GIA stapler was placed across the mid sigmoid colon and  fired. This was likewise done at the colorectal juncture. The mesentery was divided and ligated using the LigaSure. A suture was placed proximally for orientation purposes. The specimen was sent to pathology further examination. During the dissection, there was a small area of possible was encountered. A side to side anastomosis was then performed using the GIA stapler. The colotomy was closed using a TA 60 stapler. The staple line was bolstered using 3-0 silk sutures. The surrounding adipose tissue was also placed around the anastomosis. The anastomosis was noted to be widely patent, without evidence of an anastomotic leak. The abdominal cavity was copious irrigated with normal saline once all operating personnel changed their gowns and gloves. Dirty instruments were set aside in 2 instruments were used. Surgicel and Gelfoam were placed along the sacrum. The anastomosis was again inspected and noted to be patent. The 1.5 cm area of inflammation on the bladder wall was debrided and the bladder wall closed using 2-0 chromic gut interrupted sutures. Some surrounding adipose tissue was also placed over this repair.  The bowel was returned into the abdominal cavity in an orderly fashion. The fascia was reapproximated using a looped 0 PDS. The subcutaneous layer was irrigated normal saline and the skin was closed using staples. Betadine ointment dorsal dressings were applied.  All tape and needle counts were correct at the end of the procedure. Patient was extubated in the operating room and transferred to PACU in stable condition.  Complications:  None  EBL:  400 cc  Specimen:  Sigmoid colon, suture proximal

## 2012-10-26 NOTE — Anesthesia Preprocedure Evaluation (Signed)
Anesthesia Evaluation    Airway Mallampati: II TM Distance: >3 FB     Dental  (+) Edentulous Upper, Poor Dentition, Chipped, Missing and Loose   Pulmonary shortness of breath, COPDformer smoker,  breath sounds clear to auscultation        Cardiovascular hypertension, Pt. on medications + CAD, + Past MI, + CABG and +CHF + dysrhythmias Atrial Fibrillation + Valvular Problems/Murmurs MR and AS Rhythm:Regular Rate:Normal     Neuro/Psych    GI/Hepatic PUD,   Endo/Other  Hypothyroidism   Renal/GU      Musculoskeletal   Abdominal   Peds  Hematology   Anesthesia Other Findings   Reproductive/Obstetrics                           Anesthesia Physical Anesthesia Plan  ASA: IV  Anesthesia Plan: General   Post-op Pain Management:    Induction: Intravenous  Airway Management Planned: Oral ETT  Additional Equipment:   Intra-op Plan:   Post-operative Plan: Extubation in OR  Informed Consent: I have reviewed the patients History and Physical, chart, labs and discussed the procedure including the risks, benefits and alternatives for the proposed anesthesia with the patient or authorized representative who has indicated his/her understanding and acceptance.     Plan Discussed with:   Anesthesia Plan Comments: ([place]ate)        Anesthesia Quick Evaluation

## 2012-10-26 NOTE — Transfer of Care (Signed)
Immediate Anesthesia Transfer of Care Note  Patient: Cory Ryan  Procedure(s) Performed: Procedure(s) (LRB): PARTIAL COLECTOMY (N/A)  Patient Location: PACU  Anesthesia Type: General  Level of Consciousness: awake  Airway & Oxygen Therapy: Patient Spontanous Breathing and non-rebreather face mask  Post-op Assessment: Report given to PACU RN, Post -op Vital signs reviewed and stable and Patient moving all extremities  Post vital signs: Reviewed and stable  Complications: No apparent anesthesia complications

## 2012-10-26 NOTE — Interval H&P Note (Signed)
History and Physical Interval Note:  10/26/2012 7:27 AM  Cory Ryan  has presented today for surgery, with the diagnosis of Diverticulitis  The various methods of treatment have been discussed with the patient and family. After consideration of risks, benefits and other options for treatment, the patient has consented to  Procedure(s) (LRB) with comments: PARTIAL COLECTOMY (N/A) as a surgical intervention .  The patient's history has been reviewed, patient examined, no change in status, stable for surgery.  I have reviewed the patient's chart and labs.  Questions were answered to the patient's satisfaction.     Franky Macho A

## 2012-10-26 NOTE — Anesthesia Postprocedure Evaluation (Signed)
Anesthesia Post Note  Patient: Cory Ryan  Procedure(s) Performed: Procedure(s) (LRB): PARTIAL COLECTOMY (N/A)  Anesthesia type: General  Patient location: PACU  Post pain: Pain level controlled  Post assessment: Post-op Vital signs reviewed, Patient's Cardiovascular Status Stable, Respiratory Function Stable, Patent Airway, No signs of Nausea or vomiting and Pain level controlled  Last Vitals:  Filed Vitals:   10/26/12 1011  BP: 162/64  Pulse: 76  Temp: 36.6 C  Resp: 12    Post vital signs: Reviewed and stable  Level of consciousness: awake and alert   Complications: No apparent anesthesia complications

## 2012-10-27 ENCOUNTER — Encounter (HOSPITAL_COMMUNITY): Payer: Self-pay | Admitting: General Surgery

## 2012-10-27 LAB — MAGNESIUM: Magnesium: 1.8 mg/dL (ref 1.5–2.5)

## 2012-10-27 LAB — CBC
Hemoglobin: 10.3 g/dL — ABNORMAL LOW (ref 13.0–17.0)
MCH: 27.5 pg (ref 26.0–34.0)
MCHC: 32.9 g/dL (ref 30.0–36.0)
MCV: 83.7 fL (ref 78.0–100.0)
RBC: 3.74 MIL/uL — ABNORMAL LOW (ref 4.22–5.81)

## 2012-10-27 LAB — BASIC METABOLIC PANEL
BUN: 15 mg/dL (ref 6–23)
CO2: 25 mEq/L (ref 19–32)
Calcium: 8.5 mg/dL (ref 8.4–10.5)
Creatinine, Ser: 1.12 mg/dL (ref 0.50–1.35)

## 2012-10-27 MED ORDER — ACETAMINOPHEN 10 MG/ML IV SOLN
1000.0000 mg | Freq: Four times a day (QID) | INTRAVENOUS | Status: AC
  Administered 2012-10-27 – 2012-10-28 (×4): 1000 mg via INTRAVENOUS
  Filled 2012-10-27 (×4): qty 100

## 2012-10-27 MED ORDER — PANTOPRAZOLE SODIUM 40 MG PO TBEC
40.0000 mg | DELAYED_RELEASE_TABLET | Freq: Every day | ORAL | Status: DC
  Administered 2012-10-27 – 2012-10-29 (×3): 40 mg via ORAL
  Filled 2012-10-27 (×4): qty 1

## 2012-10-27 MED ORDER — KCL IN DEXTROSE-NACL 20-5-0.45 MEQ/L-%-% IV SOLN
INTRAVENOUS | Status: DC
  Administered 2012-10-27 (×2): via INTRAVENOUS
  Administered 2012-10-28: 50 mL/h via INTRAVENOUS

## 2012-10-27 NOTE — Progress Notes (Signed)
1 Day Post-Op  Subjective: Having some heartburn. Has had flatus, though some bowel movements have been noted by nursing. Mild incisional pain.  Objective: Vital signs in last 24 hours: Temp:  [97.5 F (36.4 C)-98.6 F (37 C)] 98.2 F (36.8 C) (01/16 0800) Pulse Rate:  [64-81] 81  (01/15 1147) Resp:  [11-21] 18  (01/16 0800) BP: (118-186)/(34-89) 148/65 mmHg (01/16 0800) SpO2:  [79 %-100 %] 99 % (01/15 1147) Weight:  [75.5 kg (166 lb 7.2 oz)-76.6 kg (168 lb 14 oz)] 75.5 kg (166 lb 7.2 oz) (01/16 0500) Last BM Date: 10/26/12  Intake/Output from previous day: 01/15 0701 - 01/16 0700 In: 5095 [P.O.:220; I.V.:4475; IV Piggyback:400] Out: 1653 [Urine:1050; Emesis/NG output:200; Stool:3; Blood:400] Intake/Output this shift:    General appearance: alert, cooperative and no distress Resp: clear to auscultation bilaterally Cardio: irregularly irregular rhythm GI: Soft. Minimal bowel sounds appreciated. Dressing dry and intact.  Lab Results:   Basename 10/27/12 0440 10/26/12 1542  WBC 9.8 --  HGB 10.3* 11.4*  HCT 31.3* 34.8*  PLT 84* --   BMET  Basename 10/27/12 0440  NA 139  K 3.8  CL 102  CO2 25  GLUCOSE 130*  BUN 15  CREATININE 1.12  CALCIUM 8.5   PT/INR No results found for this basename: LABPROT:2,INR:2 in the last 72 hours  Studies/Results: No results found.  Anti-infectives: Anti-infectives     Start     Dose/Rate Route Frequency Ordered Stop   10/26/12 1300   Ampicillin-Sulbactam (UNASYN) 3 g in sodium chloride 0.9 % 100 mL IVPB        3 g 100 mL/hr over 60 Minutes Intravenous Every 6 hours 10/26/12 1209 10/27/12 0225   10/26/12 0649   cefoTEtan in Dextrose 5% (CEFOTAN) IVPB 2 g  Status:  Discontinued        2 g Intravenous Every 12 hours 10/26/12 0649 10/26/12 1155          Assessment/Plan: s/p Procedure(s): PARTIAL COLECTOMY Impression: Stable on postoperative day one. Cardiopulmonary status stable. Hematocrit stable. Plan: We'll transfer to  telemetry. Advance to full liquid diet. Adjust IV fluids.  LOS: 1 day    Cory Ryan A 10/27/2012

## 2012-10-27 NOTE — Anesthesia Postprocedure Evaluation (Signed)
  Anesthesia Post-op Note  Patient: Cory Ryan  Procedure(s) Performed: Procedure(s) (LRB) with comments: PARTIAL COLECTOMY (N/A) - Closure of Colovesical Fistula  Patient Location: ICU  Anesthesia Type:General  Level of Consciousness: awake, alert , oriented and patient cooperative  Airway and Oxygen Therapy: Patient Spontanous Breathing and Patient connected to nasal cannula oxygen  Post-op Pain: 5 /10, moderate  Post-op Assessment: Post-op Vital signs reviewed, Patient's Cardiovascular Status Stable, Respiratory Function Stable, Patent Airway, No signs of Nausea or vomiting, Adequate PO intake and Pain level controlled  Post-op Vital Signs: Reviewed and stable  Complications: No apparent anesthesia complications

## 2012-10-27 NOTE — Progress Notes (Signed)
UR Chart Review Completed  

## 2012-10-27 NOTE — Addendum Note (Signed)
Addendum  created 10/27/12 0810 by Despina Hidden, CRNA   Modules edited:Notes Section

## 2012-10-27 NOTE — Care Management Note (Signed)
    Page 1 of 2   10/28/2012     6:01:49 PM   CARE MANAGEMENT NOTE 10/28/2012  Patient:  CORDERIUS, SARACENI   Account Number:  [id number]  Date Initiated:  10/27/2012  Documentation initiated by:  Sharrie Rothman  Subjective/Objective Assessment:   Pt admitted from home s/p partial colectomy. Pt lives with his wife and son. pts wife is currently a hospice pt. Pt was independent with ADL's and will return home at discharge.     Action/Plan:   No CM or HH needs noted.   Anticipated DC Date:  10/31/2012   Anticipated DC Plan:  HOME/SELF CARE      DC Planning Services  CM consult      [facility] Choice  HOME HEALTH   Choice offered to / List presented to:  C-1 Patient        HH arranged  HH-1 RN      [facility] agency  [facility]   Status of service:  Completed, signed off Medicare Important Message given?  YES (If response is "NO", the following Medicare IM given date fields will be blank) Date Medicare IM given:  10/28/2012 Date Additional Medicare IM given:    Discharge Disposition:  HOME W HOME HEALTH SERVICES  Per UR Regulation:    If discussed at Long Length of Stay Meetings, dates discussed:    Comments:  10/28/12 1525 Arlyss Queen, RN BSN CM Pt to be discharged home this weekend with foley. Pt chose [facility] for RN. Alroy Bailiff of [facility] is aware and the weekend staff will fax [facility] orders to [facility] once written. Pt has no DME needs. Pt and pts nurse aware of discharge arrangements.   10/27/12 1522 Arlyss Queen, RN BSN CM

## 2012-10-28 ENCOUNTER — Encounter (HOSPITAL_COMMUNITY): Payer: Self-pay | Admitting: General Surgery

## 2012-10-28 LAB — BASIC METABOLIC PANEL
BUN: 17 mg/dL (ref 6–23)
Calcium: 8.3 mg/dL — ABNORMAL LOW (ref 8.4–10.5)
Creatinine, Ser: 1.23 mg/dL (ref 0.50–1.35)
GFR calc Af Amer: 65 mL/min — ABNORMAL LOW (ref >90)
GFR calc non Af Amer: 56 mL/min — ABNORMAL LOW (ref >90)

## 2012-10-28 LAB — CBC
HCT: 24.1 % — ABNORMAL LOW (ref 39.0–52.0)
MCHC: 32.4 g/dL (ref 30.0–36.0)
MCV: 84 fL (ref 78.0–100.0)
Platelets: 85 10*3/uL — ABNORMAL LOW (ref 150–400)
RDW: 15 % (ref 11.5–15.5)

## 2012-10-28 LAB — MAGNESIUM: Magnesium: 2.4 mg/dL (ref 1.5–2.5)

## 2012-10-28 MED ORDER — DEXTROSE 5 % IV SOLN
22.0000 mmol | Freq: Once | INTRAVENOUS | Status: AC
  Administered 2012-10-28: 22 mmol via INTRAVENOUS
  Filled 2012-10-28: qty 7.33

## 2012-10-28 NOTE — Progress Notes (Signed)
2 Days Post-Op  Subjective: Minimal incisional pain. Had multiple bowel movements yesterday. He is depressed because his wife has been placed in hospice.  Objective: Vital signs in last 24 hours: Temp:  [97.5 F (36.4 C)-98.6 F (37 C)] 97.6 F (36.4 C) (01/17 0500) Pulse Rate:  [75-78] 78  (01/17 0500) Resp:  [12-22] 18  (01/17 0500) BP: (127-150)/(46-74) 149/60 mmHg (01/17 0500) SpO2:  [99 %] 99 % (01/17 0500) Last BM Date: 10/26/12  Intake/Output from previous day: 01/16 0701 - 01/17 0700 In: 1082.1 [I.V.:982.1; IV Piggyback:100] Out: 1500 [Urine:1500] Intake/Output this shift: Total I/O In: 240 [P.O.:240] Out: -   General appearance: alert, cooperative and fatigued Resp: clear to auscultation bilaterally Cardio: irregularly irregular rhythm GI: Soft. Incision healing well. Bowel sounds appreciated.  Lab Results:   [facility] 10/28/12 0418 10/27/12 0440  WBC 8.8 9.8  HGB 7.8* 10.3*  HCT 24.1* 31.3*  PLT 85* 84*   BMET  Basename 10/28/12 0418 10/27/12 0440  NA 134* 139  K 3.9 3.8  CL 103 102  CO2 28 25  GLUCOSE 137* 130*  BUN 17 15  CREATININE 1.23 1.12  CALCIUM 8.3* 8.5   PT/INR No results found for this basename: LABPROT:2,INR:2 in the last 72 hours  Studies/Results: No results found.  Anti-infectives: Anti-infectives     Start     Dose/Rate Route Frequency Ordered Stop   10/26/12 1300   Ampicillin-Sulbactam (UNASYN) 3 g in sodium chloride 0.9 % 100 mL IVPB        3 g 100 mL/hr over 60 Minutes Intravenous Every 6 hours 10/26/12 1209 10/27/12 0225   10/26/12 0649   cefoTEtan in Dextrose 5% (CEFOTAN) IVPB 2 g  Status:  Discontinued        2 g Intravenous Every 12 hours 10/26/12 0649 10/26/12 1155          Assessment/Plan: s/p Procedure(s): PARTIAL COLECTOMY Impression: Bowel function returning on postoperative day 2. He did have a drop in his hemoglobin which I suspect is secondary to surgery and chronic disease. As he is chronically  anticoagulated, we'll give him one unit of packed red blood cells. We'll hold Lovenox due to the thrombocytopenia.  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 The patient is aware.  Dr. Leticia Penna will be covering me this weekend. He is to go home with a Foley in place.  LOS: 2 days    Cory Ryan A 10/28/2012

## 2012-10-28 NOTE — Plan of Care (Signed)
Problem: Phase I Progression Outcomes Goal: Voiding-avoid urinary catheter unless indicated Outcome: Not Met (add Reason) Pt will be discharge with foley in place.

## 2012-10-28 NOTE — Progress Notes (Signed)
UR Chart Review Completed  

## 2012-10-28 NOTE — Progress Notes (Signed)
MEDICATION RELATED CONSULT NOTE - INITIAL   Pharmacy Consult for Phosphorous Replacement Indication: Hypophosphatemia  No Known Allergies  Patient Measurements: Height: 5\' 10"  (177.8 cm) Weight: 166 lb 7.2 oz (75.5 kg) IBW/kg (Calculated) : 73   Vital Signs: Temp: 97.6 F (36.4 C) (01/17 0500) Temp src: Oral (01/17 0500) BP: 149/60 mmHg (01/17 0500) Pulse Rate: 78  (01/17 0500) Intake/Output from previous day: 01/16 0701 - 01/17 0700 In: 1082.1 [I.V.:982.1; IV Piggyback:100] Out: 1500 [Urine:1500] Intake/Output from this shift:    Labs: BMET    Component Value Date/Time   NA 134* 10/28/2012 0418   K 3.9 10/28/2012 0418   CL 103 10/28/2012 0418   CO2 28 10/28/2012 0418   GLUCOSE 137* 10/28/2012 0418   BUN 17 10/28/2012 0418   CREATININE 1.23 10/28/2012 0418   CALCIUM 8.3* 10/28/2012 0418   GFRNONAA 56* 10/28/2012 0418   GFRAA 65* 10/28/2012 0418    Basename 10/28/12 0418 10/27/12 0440 10/26/12 1542  WBC 8.8 9.8 --  HGB 7.8* 10.3* 11.4*  HCT 24.1* 31.3* 34.8*  PLT 85* 84* --  APTT -- -- --  CREATININE 1.23 1.12 --  LABCREA -- -- --  CREATININE 1.23 1.12 --  CREAT24HRUR -- -- --  MG 2.4 1.8 --  PHOS 1.4* 4.1 --  ALBUMIN -- -- --  PROT -- -- --  ALBUMIN -- -- --  AST -- -- --  ALT -- -- --  ALKPHOS -- -- --  BILITOT -- -- --  BILIDIR -- -- --  IBILI -- -- --   Estimated Creatinine Clearance: 55.2 ml/min (by C-G formula based on Cr of 1.23).  Medical History: Past Medical History  Diagnosis Date  . A-fib     On coumadin  . CHF (congestive heart failure)     Systolic. EF 40-45%, per Echo 06/2011  . Emphysema   . CAD (coronary artery disease)   . Gastric erosions 12/2005    per EGD; Dr. Jena Gauss  . Colon polyps 12/2005    per colonoscopy  . Ischemic cardiomyopathy     Echo 06/2011. EF 40-45%.  . Aortic stenosis, moderate 06/2011    per Echo.  . Mitral valve regurgitation 06/2011    Moderate per Echo.  . Pulmonary nodules 12/2005     per CT chest  .  Hypothyroidism   . Psoriasis   . Myocardial infarction     10 yrs ago  . Renal disorder     "work at 54 %  . Anemia    Medications:  Scheduled:    . [COMPLETED] acetaminophen  1,000 mg Intravenous Q6H  . alvimopan  12 mg Oral BID  . carvedilol  6.25 mg Oral BID WC  . pantoprazole  40 mg Oral Q1200  . [DISCONTINUED] enoxaparin  40 mg Subcutaneous Q24H   Assessment: Okay for Protocol  Goal of Therapy:  Phos Level 2.3 - 4.6 mg/dL  Plan:  K Phos 1.61 mMol/kg of lean body weight (IBW). Place in 500 cc D5W and give over 6 hours. Check level in AM.  Note: Pharmacy Brief Note - Alvimopan (Entereg)  The standing order set for alvimopan (Entereg) now includes an automatic order to discontinue the drug after the patient has had a bowel movement.  The change was approved by the Pharmacy & Therapeutics Committee and the Medical Executive Committee.    This patient has had a bowel movement documented by nursing.  Therefore, alvimopan has been discontinued.  If there are questions, please contact  the pharmacy at [phone].  Thank you- Mady Gemma, [facility] 10/28/2012 8:19 AM    Mady Gemma 10/28/2012,8:12 AM

## 2012-10-29 LAB — BASIC METABOLIC PANEL
BUN: 11 mg/dL (ref 6–23)
CO2: 28 mEq/L (ref 19–32)
Calcium: 8.2 mg/dL — ABNORMAL LOW (ref 8.4–10.5)
Creatinine, Ser: 1.14 mg/dL (ref 0.50–1.35)
Glucose, Bld: 127 mg/dL — ABNORMAL HIGH (ref 70–99)
Sodium: 139 mEq/L (ref 135–145)

## 2012-10-29 LAB — CBC
HCT: 28 % — ABNORMAL LOW (ref 39.0–52.0)
Hemoglobin: 9.1 g/dL — ABNORMAL LOW (ref 13.0–17.0)
MCH: 27.8 pg (ref 26.0–34.0)
MCHC: 32.5 g/dL (ref 30.0–36.0)
MCV: 85.6 fL (ref 78.0–100.0)
RBC: 3.27 MIL/uL — ABNORMAL LOW (ref 4.22–5.81)

## 2012-10-29 LAB — PHOSPHORUS: Phosphorus: 1.9 mg/dL — ABNORMAL LOW (ref 2.3–4.6)

## 2012-10-29 MED ORDER — POTASSIUM PHOSPHATE DIBASIC 3 MMOLE/ML IV SOLN
11.0000 mmol | Freq: Once | INTRAVENOUS | Status: AC
  Administered 2012-10-29: 11 mmol via INTRAVENOUS
  Filled 2012-10-29: qty 3.67

## 2012-10-29 MED ORDER — LOPERAMIDE HCL 2 MG PO CAPS: 2.0000 mg | ORAL_CAPSULE | ORAL | Status: AC | PRN

## 2012-10-29 MED ORDER — LOPERAMIDE HCL 2 MG PO CAPS
2.0000 mg | ORAL_CAPSULE | ORAL | Status: DC | PRN
  Administered 2012-10-29 (×2): 2 mg via ORAL
  Filled 2012-10-29 (×2): qty 1

## 2012-10-29 MED ORDER — HYDROCODONE-ACETAMINOPHEN 5-325 MG PO TABS: 1.0000 | ORAL_TABLET | ORAL | Status: DC | PRN

## 2012-10-29 NOTE — Progress Notes (Signed)
MEDICATION RELATED CONSULT NOTE  Pharmacy Consult for Phosphorous Replacement Indication: Hypophosphatemia  No Known Allergies  Patient Measurements: Height: 5\' 10"  (177.8 cm) Weight: 166 lb 7.2 oz (75.5 kg) IBW/kg (Calculated) : 73   Vital Signs: Temp: 98.1 F (36.7 C) (01/18 0552) Temp src: Oral (01/18 0552) BP: 177/73 mmHg (01/18 0552) Pulse Rate: 83  (01/18 0552) Intake/Output from previous day: 01/17 0701 - 01/18 0700 In: 980 [P.O.:480; I.V.:500] Out: 2300 [Urine:2300] Intake/Output from this shift: Total I/O In: 240 [P.O.:240] Out: 550 [Urine:550]  Labs: BMET    Component Value Date/Time   NA 139 10/29/2012 0424   K 3.8 10/29/2012 0424   CL 105 10/29/2012 0424   CO2 28 10/29/2012 0424   GLUCOSE 127* 10/29/2012 0424   BUN 11 10/29/2012 0424   CREATININE 1.14 10/29/2012 0424   CALCIUM 8.2* 10/29/2012 0424   GFRNONAA 62* 10/29/2012 0424   GFRAA 72* 10/29/2012 0424    Basename 10/29/12 0424 10/28/12 1549 10/28/12 0418 10/27/12 0440  WBC 7.4 -- 8.8 9.8  HGB 9.1* 10.2* 7.8* --  HCT 28.0* 30.6* 24.1* --  PLT 85* -- 85* 84*  APTT -- -- -- --  CREATININE 1.14 -- 1.23 1.12  LABCREA -- -- -- --  CREATININE 1.14 -- 1.23 1.12  CREAT24HRUR -- -- -- --  MG 2.2 -- 2.4 1.8  PHOS 1.9* -- 1.4* 4.1  ALBUMIN -- -- -- --  PROT -- -- -- --  ALBUMIN -- -- -- --  AST -- -- -- --  ALT -- -- -- --  ALKPHOS -- -- -- --  BILITOT -- -- -- --  BILIDIR -- -- -- --  IBILI -- -- -- --   Estimated Creatinine Clearance: 59.6 ml/min (by C-G formula based on Cr of 1.14).  Medications:  Scheduled:     . carvedilol  6.25 mg Oral BID WC  . pantoprazole  40 mg Oral Q1200  . [COMPLETED] potassium phosphate IVPB (mmol)  22 mmol Intravenous Once   Assessment: Okay for Protocol Phos Level still below goal at 1.9  Goal of Therapy:  Phos Level 2.3 - 4.6 mg/dL  Plan:  K Phos 4.09 mMol/kg of lean body weight (IBW). Place in 250 cc D5W and give over 6 hours. Check level in AM.  Mady Gemma 10/29/2012,12:29 PM

## 2012-10-29 NOTE — Discharge Summary (Addendum)
Physician Discharge Summary  Patient ID: Cory Ryan MRN: [id number] DOB/AGE: 1941-02-18 72 y.o.  Admit date: 10/26/2012 Discharge date: 10/29/2012  Admission Diagnoses:Colovesicular fistula   Discharge Diagnoses: the same Active Problems:  * No active hospital problems. *    Discharged Condition: stable  Hospital Course: Patient presented for operative repair of a colonic fistula.  Underwent surgical repair.  Tolerated well.  Spent brief post operative period in ICU.  Transferred to floor.  Advanced on diet.  Pain controlled.  Ambulating.  Patient requesting discharge.  Consults: None  Significant Diagnostic Studies: labs: daily  Treatments: surgery: Sigmoid colectomy  Discharge Exam: Blood pressure 179/62, pulse 64, temperature 97.5 F (36.4 C), temperature source Oral, resp. rate 20, height 5\' 10"  (1.778 m), weight 75.5 kg (166 lb 7.2 oz), SpO2 97.00%. General appearance: alert and no distress Resp: normal percussion bilaterally Cardio: regular rate and rhythm GI: +BS, soft, expected tenderness.  Incision clean dry and intact.  Disposition: 01-Home or Self Care  Discharge Orders    Future Orders Please Complete By Expires   Diet - low sodium heart healthy      Increase activity slowly      Discharge instructions      Comments:   Increase activity as tolerated. May place ice pack for comfort.  Alternate an anti-inflammatory such as ibuprofen (Motrin, Advil) 400-600mg  every 6 hours with the prescribed pain medication.   Do not take any additional acetaminophen as there is Tylenol in the pain medication.   Driving Restrictions      Comments:   No driving while on pain medications.   Lifting restrictions      Comments:   No lifting over 20lbs for 4-5 weeks post-op.   Discharge wound care:      Comments:   Clean surgical sites with soap and water.  May shower the morning after surgery unless instructed by Dr. Leticia Penna otherwise.  No soaking for 2-3 weeks.    If  adhesive strips are in place, they may be removed in 1-2 weeks while in the shower.   Call MD for:  temperature >100.4      Call MD for:  persistant nausea and vomiting      Call MD for:  severe uncontrolled pain      Call MD for:  redness, tenderness, or signs of infection (pain, swelling, redness, odor or green/yellow discharge around incision site)      Call MD for:  difficulty breathing, headache or visual disturbances          Medication List     As of 10/29/2012  5:46 PM    TAKE these medications         carvedilol 6.25 MG tablet   Commonly known as: COREG   Take 6.25 mg by mouth 2 (two) times daily with a meal.      furosemide 80 MG tablet   Commonly known as: LASIX   Take 40 mg by mouth 2 (two) times daily.      HYDROcodone-acetaminophen 5-325 MG per tablet   Commonly known as: NORCO/VICODIN   Take 1-2 tablets by mouth every 4 (four) hours as needed for pain.      loperamide 2 MG capsule   Commonly known as: IMODIUM   Take 1 capsule (2 mg total) by mouth as needed for diarrhea or loose stools.      potassium chloride SA 20 MEQ tablet   Commonly known as: K-DUR,KLOR-CON   Take 10 mEq by mouth  2 (two) times daily.           Follow-up Information    Follow up with Dalia Heading, MD. On 11/08/2012.   Contact information:   [address] [place] [address] [phone]          Signed: Fabio Bering 10/29/2012, 5:46 PM  Addendum: Patient had acute surgical blood loss on top of a history of chronic anemia. He does need to be anticoagulated as an outpatient, thus he did receive one unit of packed red blood cells as preoperatively.

## 2012-10-29 NOTE — Progress Notes (Addendum)
Pt is ready to be discharged.  Pt will be d/c'd with his foley catheter and f/u up with Dr. Lovell Sheehan per Dr Leticia Penna request. Pt is in stable condition at this time.  Pt discharged with instructions, prescriptions and carenotes.  Demonstrated for the patient/daughter to empty the foley leg bag.  They verbalized understanding.  [facility] notified of pts discharge and order was faxed by Earnstine Regal.  Pt left the floor via w/c with staff in stable condition.

## 2012-10-31 LAB — TYPE AND SCREEN
ABO/RH(D): O POS
Antibody Screen: NEGATIVE
Unit division: 0
Unit division: 0

## 2012-11-28 ENCOUNTER — Encounter (HOSPITAL_COMMUNITY): Payer: Self-pay | Admitting: Oncology

## 2012-11-28 ENCOUNTER — Encounter (HOSPITAL_COMMUNITY): Payer: Medicare Other | Attending: Oncology | Admitting: Oncology

## 2012-11-28 VITALS — BP 95/53 | HR 65 | Temp 97.7°F | Resp 18 | Ht 66.25 in | Wt 148.2 lb

## 2012-11-28 DIAGNOSIS — Z09 Encounter for follow-up examination after completed treatment for conditions other than malignant neoplasm: Secondary | ICD-10-CM | POA: Insufficient documentation

## 2012-11-28 DIAGNOSIS — J4489 Other specified chronic obstructive pulmonary disease: Secondary | ICD-10-CM | POA: Insufficient documentation

## 2012-11-28 DIAGNOSIS — I2510 Atherosclerotic heart disease of native coronary artery without angina pectoris: Secondary | ICD-10-CM | POA: Insufficient documentation

## 2012-11-28 DIAGNOSIS — C189 Malignant neoplasm of colon, unspecified: Secondary | ICD-10-CM

## 2012-11-28 DIAGNOSIS — I4891 Unspecified atrial fibrillation: Secondary | ICD-10-CM | POA: Insufficient documentation

## 2012-11-28 DIAGNOSIS — J449 Chronic obstructive pulmonary disease, unspecified: Secondary | ICD-10-CM

## 2012-11-28 DIAGNOSIS — Z85038 Personal history of other malignant neoplasm of large intestine: Secondary | ICD-10-CM | POA: Insufficient documentation

## 2012-11-28 DIAGNOSIS — C187 Malignant neoplasm of sigmoid colon: Secondary | ICD-10-CM

## 2012-11-28 LAB — CBC
MCH: 26.7 pg (ref 26.0–34.0)
MCHC: 31.8 g/dL (ref 30.0–36.0)
Platelets: 137 10*3/uL — ABNORMAL LOW (ref 150–400)
RBC: 4.23 MIL/uL (ref 4.22–5.81)

## 2012-11-28 LAB — COMPREHENSIVE METABOLIC PANEL
ALT: 8 U/L (ref 0–53)
AST: 14 U/L (ref 0–37)
CO2: 26 mEq/L (ref 19–32)
Calcium: 9.5 mg/dL (ref 8.4–10.5)
Sodium: 136 mEq/L (ref 135–145)
Total Protein: 7.7 g/dL (ref 6.0–8.3)

## 2012-11-28 MED ORDER — HYDROCODONE-ACETAMINOPHEN 5-325 MG PO TABS: 1.0000 | ORAL_TABLET | ORAL | Status: DC | PRN

## 2012-11-28 NOTE — Progress Notes (Signed)
Cory Ryan presented for labwork. Labs per MD order drawn via Peripheral Line 23 gauge needle inserted in left AC  Good blood return present. Procedure without incident.  Needle removed intact. Patient tolerated procedure well.

## 2012-11-28 NOTE — Progress Notes (Signed)
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 He is not in need of chemotherapy. 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 Had abdominal discomfort after urinary symptoms took him to his urologist. 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 He underwent successful surgery on 10/26/2012. He is here today for consultation. Oncology review of systems is noncontributory. He does have a very long smoking history in spite of stopping 10 years ago. He is a candidate for a low-dose CT scan of his chest yearly for 3 years.  Family history does reveal a brother who died at age 56 of metastatic colon cancer but there are is no other family history. Both parents are deceased from what sounds like lung and heart disease. He has for other brothers. He has one son and one daughter. They are in good health to the best of his knowledge. He is retired. Is not a drinker. He smoked over a pack a day on average.   BP 95/53  Pulse 65  Temp(Src) 97.7 F (36.5 C) (Oral)  Resp 18  Ht 5' 6.25" (1.683 m)  Wt 148 lb 3.2 oz (67.223 kg)  BMI 23.73 kg/m2  He is in no acute distress. Does complain of some mild abdominal discomfort still in the incision site. He has no lymphadenopathy in cervical, supraclavicular, infraclavicular, axillary or inguinal areas. Lungs are clear but with diminished breath sounds. Heart shows a regular rhythm and rate without obvious murmur rub or gallop. Abdomen shows a low midline incision which is healing very nicely. Bowel  sounds are present. He has no hepatosplenomegaly. He has no lower leg edema or arm edema. Pulses in his feet are significantly diminished. He denies claudication. HEENT exam shows poor dental hygiene. Throat is clear however. Tongue is normal and in the midline. Pupils are equally round and reactive to light he has no thyromegaly. He does have bilateral mild gynecomastia.  This gentleman does not need chemotherapy nor radiation therapy. He does need followup with a colonoscopy in one year, CEA levels every 3 months for approximately 5 years. We will start with our baseline blood work today, then he also needs a CAT scan of his chest once a year for 3 years. He we'll continue to abstain from cigarette use.  It is of note that his wife is with hospice due to coronary artery disease. He was slightly upset but very calm essentially. We will see him as mentioned above

## 2012-11-28 NOTE — Patient Instructions (Addendum)
[facility] Discharge Instructions  RECOMMENDATIONS MADE BY THE CONSULTANT AND ANY TEST RESULTS WILL BE SENT TO YOUR REFERRING PHYSICIAN.  EXAM FINDINGS BY THE PHYSICIAN TODAY AND SIGNS OR SYMPTOMS TO REPORT TO CLINIC OR PRIMARY PHYSICIAN: Exam and discussion by MD.  Bonita Quin have Stage I colon cancer and you do not need chemotherapy.  We only need to monitor you with blood work every 3 months.  We also need to get a CT of your chest due to your smoking history.  MEDICATIONS PRESCRIBED:  Rx for Hydrocodone given follow instructions.  INSTRUCTIONS GIVEN AND DISCUSSED: Report changes in bowel habits, blood in your bowel movement, weight loss, etc.  SPECIAL INSTRUCTIONS/FOLLOW-UP: Blood work today, CT scan of your chest then Blood work every 3 months (CEA) and for follow-up in 1 year.  Thank you for choosing [facility] to provide your oncology and hematology care.  To afford each patient quality time with our providers, please arrive at least 15 minutes before your scheduled appointment time.  With your help, our goal is to use those 15 minutes to complete the necessary work-up to ensure our physicians have the information they need to help with your evaluation and healthcare recommendations.    Effective January 1st, 2014, we ask that you re-schedule your appointment with our physicians should you arrive 10 or more minutes late for your appointment.  We strive to give you quality time with our providers, and arriving late affects you and other patients whose appointments are after yours.    Again, thank you for choosing [facility].  Our hope is that these requests will decrease the amount of time that you wait before being seen by our physicians.       _____________________________________________________________  Should you have questions after your visit to [facility], please contact our office at [phone] between the hours of  8:30 a.m. and 5:00 p.m.  Voicemails left after 4:30 p.m. will not be returned until the following business day.  For prescription refill requests, have your pharmacy contact our office with your prescription refill request.

## 2012-11-29 ENCOUNTER — Other Ambulatory Visit (HOSPITAL_COMMUNITY): Payer: Self-pay | Admitting: Oncology

## 2012-11-29 DIAGNOSIS — D696 Thrombocytopenia, unspecified: Secondary | ICD-10-CM

## 2012-11-30 ENCOUNTER — Other Ambulatory Visit (HOSPITAL_COMMUNITY): Payer: Self-pay | Admitting: Oncology

## 2012-11-30 DIAGNOSIS — D649 Anemia, unspecified: Secondary | ICD-10-CM

## 2012-11-30 DIAGNOSIS — D696 Thrombocytopenia, unspecified: Secondary | ICD-10-CM

## 2012-11-30 DIAGNOSIS — C189 Malignant neoplasm of colon, unspecified: Secondary | ICD-10-CM

## 2012-12-01 ENCOUNTER — Other Ambulatory Visit (HOSPITAL_COMMUNITY): Payer: Medicare Other

## 2012-12-01 ENCOUNTER — Ambulatory Visit (HOSPITAL_COMMUNITY): Payer: Medicare Other

## 2012-12-20 ENCOUNTER — Ambulatory Visit: Payer: Self-pay | Admitting: Internal Medicine

## 2012-12-20 DIAGNOSIS — I4891 Unspecified atrial fibrillation: Secondary | ICD-10-CM

## 2012-12-20 DIAGNOSIS — Z7901 Long term (current) use of anticoagulants: Secondary | ICD-10-CM | POA: Insufficient documentation

## 2013-02-27 ENCOUNTER — Other Ambulatory Visit (HOSPITAL_COMMUNITY): Payer: Medicare Other

## 2013-02-28 ENCOUNTER — Ambulatory Visit (INDEPENDENT_AMBULATORY_CARE_PROVIDER_SITE_OTHER): Payer: Medicare Other | Admitting: Pharmacist Clinician (PhC)/ Clinical Pharmacy Specialist

## 2013-02-28 DIAGNOSIS — I4891 Unspecified atrial fibrillation: Secondary | ICD-10-CM

## 2013-02-28 DIAGNOSIS — Z7901 Long term (current) use of anticoagulants: Secondary | ICD-10-CM

## 2013-05-01 ENCOUNTER — Other Ambulatory Visit: Payer: Self-pay | Admitting: *Deleted

## 2013-05-01 DIAGNOSIS — C189 Malignant neoplasm of colon, unspecified: Secondary | ICD-10-CM

## 2013-05-01 DIAGNOSIS — J449 Chronic obstructive pulmonary disease, unspecified: Secondary | ICD-10-CM

## 2013-05-01 MED ORDER — IPRATROPIUM-ALBUTEROL 20-100 MCG/ACT IN AERS: 1.0000 | INHALATION_SPRAY | RESPIRATORY_TRACT | Status: DC | PRN

## 2013-05-01 NOTE — Telephone Encounter (Signed)
Rx was sent to pharmacy electronically. 

## 2013-05-29 ENCOUNTER — Other Ambulatory Visit (HOSPITAL_COMMUNITY): Payer: Medicare Other

## 2013-06-15 ENCOUNTER — Encounter: Payer: Self-pay | Admitting: Cardiology

## 2013-06-15 ENCOUNTER — Ambulatory Visit (INDEPENDENT_AMBULATORY_CARE_PROVIDER_SITE_OTHER): Payer: Medicare Other | Admitting: *Deleted

## 2013-06-15 ENCOUNTER — Ambulatory Visit (INDEPENDENT_AMBULATORY_CARE_PROVIDER_SITE_OTHER): Payer: Medicare Other | Admitting: Cardiology

## 2013-06-15 VITALS — BP 156/70 | HR 51 | Ht 70.0 in | Wt 166.0 lb

## 2013-06-15 DIAGNOSIS — I350 Nonrheumatic aortic (valve) stenosis: Secondary | ICD-10-CM

## 2013-06-15 DIAGNOSIS — E782 Mixed hyperlipidemia: Secondary | ICD-10-CM

## 2013-06-15 DIAGNOSIS — I340 Nonrheumatic mitral (valve) insufficiency: Secondary | ICD-10-CM

## 2013-06-15 DIAGNOSIS — I4891 Unspecified atrial fibrillation: Secondary | ICD-10-CM

## 2013-06-15 DIAGNOSIS — I5022 Chronic systolic (congestive) heart failure: Secondary | ICD-10-CM

## 2013-06-15 DIAGNOSIS — J449 Chronic obstructive pulmonary disease, unspecified: Secondary | ICD-10-CM

## 2013-06-15 DIAGNOSIS — I509 Heart failure, unspecified: Secondary | ICD-10-CM

## 2013-06-15 DIAGNOSIS — J4489 Other specified chronic obstructive pulmonary disease: Secondary | ICD-10-CM

## 2013-06-15 DIAGNOSIS — I359 Nonrheumatic aortic valve disorder, unspecified: Secondary | ICD-10-CM

## 2013-06-15 DIAGNOSIS — Z7901 Long term (current) use of anticoagulants: Secondary | ICD-10-CM

## 2013-06-15 DIAGNOSIS — I2510 Atherosclerotic heart disease of native coronary artery without angina pectoris: Secondary | ICD-10-CM

## 2013-06-15 DIAGNOSIS — I059 Rheumatic mitral valve disease, unspecified: Secondary | ICD-10-CM

## 2013-06-15 DIAGNOSIS — N183 Chronic kidney disease, stage 3 unspecified: Secondary | ICD-10-CM

## 2013-06-15 LAB — POCT INR: INR: 3.3

## 2013-06-15 MED ORDER — IPRATROPIUM-ALBUTEROL 20-100 MCG/ACT IN AERS: 1.0000 | INHALATION_SPRAY | RESPIRATORY_TRACT | Status: AC | PRN

## 2013-06-15 MED ORDER — CARVEDILOL 6.25 MG PO TABS: 6.2500 mg | ORAL_TABLET | Freq: Two times a day (BID) | ORAL | Status: DC

## 2013-06-15 MED ORDER — FUROSEMIDE 80 MG PO TABS: 40.0000 mg | ORAL_TABLET | Freq: Two times a day (BID) | ORAL | Status: DC

## 2013-06-15 MED ORDER — WARFARIN SODIUM 5 MG PO TABS: ORAL_TABLET | ORAL | Status: DC

## 2013-06-15 NOTE — Assessment & Plan Note (Signed)
Moderate prior assessment.

## 2013-06-15 NOTE — Patient Instructions (Addendum)
Your physician recommends that you schedule a follow-up appointment in: 6 months You will receive a reminder letter two months in advance reminding you to call and schedule your appointment. If you don't receive this letter, please contact our office.  Your physician has requested that you have an echocardiogram. Echocardiography is a painless test that uses sound waves to create images of your heart. It provides your doctor with information about the size and shape of your heart and how well your heart's chambers and valves are working. This procedure takes approximately one hour. There are no restrictions for this procedure.  Your physician recommends that you return for lab work in two weeks. LIVER/LIPID  Your physician recommends that you continue on your current medications as directed. Please refer to the Current Medication list given to you today.

## 2013-06-15 NOTE — Assessment & Plan Note (Signed)
Presumably reason that he has not been on ACE inhibitor, also supported by discussion with patient and son today. Need to keep a close eye on blood pressure with primary care provider. Other agents could be chosen.

## 2013-06-15 NOTE — Progress Notes (Signed)
Clinical Summary [name] is a medically complex 72 y.o.male presenting to establish cardiology followup. He is a former patient of [facility]. He is here with his son today.   He reports no angina symptoms, stable dyspnea on exertion. Needs medication refills. ECG today shows sinus bradycardia with NSST changes.  Myoview from January of this year showed LVEF 58% with a minor area of ischemia in the apical anteroseptal wall. Echocardiogram from September 2012 reported LVEF 40-45% with diastolic dysfunction and increased filling pressures, mild to moderate aortic stenosis, moderate mitral regurgitation, moderately dilated left atrium and right atrium, moderate tricuspid regurgitation with PASP 36 mm mercury.  We discussed his medications. He is not on an ACE inhibitor. They report prior history of progressive renal problems, presumably on combination diuretic and ACE inhibitor. Patient's son states that blood pressure was "133/75" at most recent check.   No Known Allergies  Current Outpatient Prescriptions  Medication Sig Dispense Refill  . ALPRAZolam (XANAX) 1 MG tablet Take 1 mg by mouth as needed.      . carvedilol (COREG) 6.25 MG tablet Take 1 tablet (6.25 mg total) by mouth 2 (two) times daily with a meal.  60 tablet  6  . furosemide (LASIX) 80 MG tablet Take 0.5 tablets (40 mg total) by mouth 2 (two) times daily. Taking 11/2 tablets bid  60 tablet  6  . HYDROcodone-acetaminophen (NORCO) 5-325 MG per tablet Take 1-2 tablets by mouth every 4 (four) hours as needed for pain.  45 tablet  0  . Ipratropium-Albuterol (COMBIVENT RESPIMAT) 20-100 MCG/ACT AERS respimat Inhale 1 puff into the lungs as needed.  1 Inhaler  1  . loperamide (IMODIUM) 2 MG capsule Take 1 capsule (2 mg total) by mouth as needed for diarrhea or loose stools.  20 capsule  2  . Magnesium Hydroxide (MILK OF MAGNESIA PO) Take 30 mLs by mouth as needed.      . potassium chloride SA (K-DUR,KLOR-CON) 20 MEQ tablet Take 10 mEq by  mouth 2 (two) times daily. Taking 1/2 tablet bid      . warfarin (COUMADIN) 5 MG tablet Take 1 tablet by mouth or as directed by coumadin nurse.  30 tablet  3   No current facility-administered medications for this visit.    Past Medical History  Diagnosis Date  . Atrial fibrillation   . Chronic systolic heart failure     LVEF 40-45%  . Emphysema   . Coronary atherosclerosis of native coronary artery     Multivessel status post CABG  . Gastric erosions 12/2005    Per EGD - Dr. Jena Gauss  . Colon polyps 12/2005    Per colonoscopy  . Ischemic cardiomyopathy   . Aortic stenosis     Moderate  . Mitral regurgitation     Moderate  . Pulmonary nodules   . Hypothyroidism   . Psoriasis   . Myocardial infarction     Remote  . CKD (chronic kidney disease) stage 3, GFR 30-59 ml/min   . Anemia   . Colon cancer   . Atrial fibrillation     Past Surgical History  Procedure Laterality Date  . Coronary artery bypass graft  March 2004    LIMA to LAD, SVG to diagonal, SVG to OM 2, SVG to RCA  . Flexible sigmoidoscopy  10/25/2012    Procedure: FLEXIBLE SIGMOIDOSCOPY;  Surgeon: Dalia Heading, MD;  Location: [facility];  Service: Gastroenterology;  Laterality: N/A;  . Partial colectomy  10/26/2012  Procedure: PARTIAL COLECTOMY;  Surgeon: Dalia Heading, MD;  Location: [facility];  Service: General;  Laterality: N/A;  Closure of Colovesical Fistula    Family History  Problem Relation Age of Onset  . Cancer Brother   . Hypertension Other     Social History Mr. Kyne reports that he quit smoking about 11 years ago. His smoking use included Cigarettes. He has a 30 pack-year smoking history. He has never used smokeless tobacco. Mr. Lema reports that he does not drink alcohol.  Review of Systems No palpitations, no dizziness or syncope. No orthopnea or PND. Chronic, intermittent mild leg edema. Otherwise negative.  Physical Examination Filed Vitals:   06/15/13 0833  BP: 156/70  Pulse: 51     Filed Weights   06/15/13 0833  Weight: 166 lb (75.297 kg)   Appears comfortable at rest. HEENT: Conjunctiva and lids normal, oropharynx clear with poor dentition. Neck: Supple, no elevated JVP or carotid bruits, no thyromegaly. Lungs: Diminished but clear to auscultation, nonlabored breathing at rest. Cardiac: Regular rate and rhythm, no S3, 2/6 systolic murmur, no pericardial rub. Abdomen: Soft, nontender, bowel sounds present. Extremities: 1+ pitting edema, distal pulses 2+. Skin: Warm and dry. Musculoskeletal: No kyphosis. Neuropsychiatric: Alert and oriented x3, affect grossly appropriate.   Problem List and Plan   Atrial fibrillation Presumably paroxysmal, in sinus rhythm today by ECG. No palpitations. Continue Coumadin. He is being established in the Coumadin clinic.  Coronary atherosclerosis of native coronary artery Clinically stable with multivessel CAD status post CABG as outlined above. Myoview from January reviewed, overall low risk. Medication refills provided.  Systolic CHF, chronic Also clinically stable by symptom review. LVEF previously 40-45% by echocardiography, was normal by Myoview earlier this year. Followup echocardiogram to be obtained.  Aortic stenosis Mild to moderate by prior assessment.  Mitral regurgitation Moderate prior assessment.  CKD (chronic kidney disease) stage 3, GFR 30-59 ml/min Presumably reason that he has not been on ACE inhibitor, also supported by discussion with patient and son today. Need to keep a close eye on blood pressure with primary care provider. Other agents could be chosen.    Jonelle Sidle, M.D., F.A.C.C.

## 2013-06-15 NOTE — Assessment & Plan Note (Signed)
Clinically stable with multivessel CAD status post CABG as outlined above. Myoview from January reviewed, overall low risk. Medication refills provided.

## 2013-06-15 NOTE — Assessment & Plan Note (Signed)
Presumably paroxysmal, in sinus rhythm today by ECG. No palpitations. Continue Coumadin. He is being established in the Coumadin clinic.

## 2013-06-15 NOTE — Assessment & Plan Note (Signed)
Also clinically stable by symptom review. LVEF previously 40-45% by echocardiography, was normal by Myoview earlier this year. Followup echocardiogram to be obtained.

## 2013-06-15 NOTE — Assessment & Plan Note (Signed)
Mild to moderate by prior assessment.

## 2013-06-22 ENCOUNTER — Ambulatory Visit (HOSPITAL_COMMUNITY)
Admission: RE | Admit: 2013-06-22 | Discharge: 2013-06-22 | Disposition: A | Discharge status: Home / Self Care | Payer: Medicare Other | Source: Ambulatory Visit | Attending: Cardiology | Admitting: Cardiology

## 2013-06-22 DIAGNOSIS — I359 Nonrheumatic aortic valve disorder, unspecified: Secondary | ICD-10-CM | POA: Insufficient documentation

## 2013-06-22 DIAGNOSIS — I2510 Atherosclerotic heart disease of native coronary artery without angina pectoris: Secondary | ICD-10-CM | POA: Insufficient documentation

## 2013-06-22 DIAGNOSIS — I059 Rheumatic mitral valve disease, unspecified: Secondary | ICD-10-CM

## 2013-06-22 DIAGNOSIS — I350 Nonrheumatic aortic (valve) stenosis: Secondary | ICD-10-CM

## 2013-06-22 DIAGNOSIS — I4891 Unspecified atrial fibrillation: Secondary | ICD-10-CM | POA: Insufficient documentation

## 2013-06-22 DIAGNOSIS — J449 Chronic obstructive pulmonary disease, unspecified: Secondary | ICD-10-CM | POA: Insufficient documentation

## 2013-06-22 DIAGNOSIS — J4489 Other specified chronic obstructive pulmonary disease: Secondary | ICD-10-CM | POA: Insufficient documentation

## 2013-06-22 DIAGNOSIS — I340 Nonrheumatic mitral (valve) insufficiency: Secondary | ICD-10-CM

## 2013-06-22 NOTE — Progress Notes (Signed)
*  PRELIMINARY RESULTS* Echocardiogram 2D Echocardiogram has been performed.  Cory Ryan 06/22/2013, 10:32 AM

## 2013-06-29 LAB — LIPID PANEL
HDL: 50 mg/dL (ref >39)
LDL Cholesterol: 126 mg/dL — ABNORMAL HIGH (ref 0–99)
Triglycerides: 91 mg/dL (ref <150)
VLDL: 18 mg/dL (ref 0–40)

## 2013-06-29 LAB — HEPATIC FUNCTION PANEL
Albumin: 4.1 g/dL (ref 3.5–5.2)
Total Protein: 6.7 g/dL (ref 6.0–8.3)

## 2013-07-13 ENCOUNTER — Ambulatory Visit (INDEPENDENT_AMBULATORY_CARE_PROVIDER_SITE_OTHER): Payer: Medicare Other | Admitting: *Deleted

## 2013-07-13 DIAGNOSIS — Z7901 Long term (current) use of anticoagulants: Secondary | ICD-10-CM

## 2013-07-13 DIAGNOSIS — I4891 Unspecified atrial fibrillation: Secondary | ICD-10-CM

## 2013-07-13 LAB — POCT INR: INR: 4.8

## 2013-07-27 ENCOUNTER — Ambulatory Visit (INDEPENDENT_AMBULATORY_CARE_PROVIDER_SITE_OTHER): Payer: Medicare Other | Admitting: *Deleted

## 2013-07-27 DIAGNOSIS — I4891 Unspecified atrial fibrillation: Secondary | ICD-10-CM

## 2013-07-27 DIAGNOSIS — Z7901 Long term (current) use of anticoagulants: Secondary | ICD-10-CM

## 2013-07-27 LAB — POCT INR: INR: 1.8

## 2013-08-17 ENCOUNTER — Ambulatory Visit (INDEPENDENT_AMBULATORY_CARE_PROVIDER_SITE_OTHER): Payer: Medicare Other | Admitting: *Deleted

## 2013-08-17 DIAGNOSIS — Z7901 Long term (current) use of anticoagulants: Secondary | ICD-10-CM

## 2013-08-17 DIAGNOSIS — I4891 Unspecified atrial fibrillation: Secondary | ICD-10-CM

## 2013-08-28 ENCOUNTER — Other Ambulatory Visit (HOSPITAL_COMMUNITY): Payer: Medicare Other

## 2013-09-06 ENCOUNTER — Ambulatory Visit (INDEPENDENT_AMBULATORY_CARE_PROVIDER_SITE_OTHER): Payer: Medicare Other | Admitting: *Deleted

## 2013-09-06 DIAGNOSIS — Z7901 Long term (current) use of anticoagulants: Secondary | ICD-10-CM

## 2013-09-06 DIAGNOSIS — I4891 Unspecified atrial fibrillation: Secondary | ICD-10-CM

## 2013-09-06 LAB — POCT INR: INR: 1.9

## 2013-09-28 ENCOUNTER — Ambulatory Visit (INDEPENDENT_AMBULATORY_CARE_PROVIDER_SITE_OTHER): Payer: Medicare Other | Admitting: *Deleted

## 2013-09-28 DIAGNOSIS — Z7901 Long term (current) use of anticoagulants: Secondary | ICD-10-CM

## 2013-09-28 DIAGNOSIS — I4891 Unspecified atrial fibrillation: Secondary | ICD-10-CM

## 2013-09-28 LAB — POCT INR: INR: 1.9

## 2013-10-16 ENCOUNTER — Other Ambulatory Visit: Payer: Self-pay | Admitting: Cardiology

## 2013-10-19 ENCOUNTER — Ambulatory Visit (INDEPENDENT_AMBULATORY_CARE_PROVIDER_SITE_OTHER): Payer: Medicare Other | Admitting: *Deleted

## 2013-10-19 DIAGNOSIS — I4891 Unspecified atrial fibrillation: Secondary | ICD-10-CM

## 2013-10-19 DIAGNOSIS — Z7901 Long term (current) use of anticoagulants: Secondary | ICD-10-CM

## 2013-10-19 LAB — POCT INR: INR: 1.7

## 2013-11-02 ENCOUNTER — Ambulatory Visit (INDEPENDENT_AMBULATORY_CARE_PROVIDER_SITE_OTHER): Payer: Medicare Other | Admitting: *Deleted

## 2013-11-02 DIAGNOSIS — I4891 Unspecified atrial fibrillation: Secondary | ICD-10-CM

## 2013-11-02 DIAGNOSIS — Z7901 Long term (current) use of anticoagulants: Secondary | ICD-10-CM

## 2013-11-02 LAB — POCT INR: INR: 3.4

## 2013-11-21 DIAGNOSIS — C189 Malignant neoplasm of colon, unspecified: Secondary | ICD-10-CM | POA: Insufficient documentation

## 2013-11-22 ENCOUNTER — Telehealth: Payer: Self-pay | Admitting: Genetic Counselor

## 2013-11-22 NOTE — Telephone Encounter (Signed)
LEFT MESSAGE FOR PATIENT TO RETURN CALL TO SCHEDULE GENETIC APPT.  °

## 2013-11-27 ENCOUNTER — Other Ambulatory Visit (HOSPITAL_COMMUNITY): Payer: Medicare Other

## 2013-11-28 ENCOUNTER — Ambulatory Visit (HOSPITAL_COMMUNITY): Payer: Medicare Other | Admitting: Oncology

## 2013-12-01 NOTE — H&P (Signed)
  NTS SOAP Note  Vital Signs:  Vitals as of: 10/23/1622: Systolic 469: Diastolic 82: Heart Rate 87: Temp 98.24F: Height 71ft 9in: Weight 179Lbs 0 Ounces: Pain Level 6: BMI 26.43  BMI : 26.43 kg/m2  Subjective: This 73 Years 52 Months old Male presents for of a large neoplasm in the colon as well as multiple polyps throughout the colon.  Was referred for near total colectomy.  Patient denies any gi complaints.  s/p sigmoid colectomy in 2014 for colon cancer.  Did not require chemotherapy.  Review of Symptoms:  Constitutional:unremarkable   Head:unremarkable    Eyes:unremarkable   Nose/Mouth/Throat:unremarkable Cardiovascular:  unremarkable   Respiratory:  cough Gastrointestin    heartburn Genitourinary:unremarkable       joint pain Skin:unremarkable Hematolgic/Lymphatic:unremarkable     Allergic/Immunologic:unremarkable     Past Medical History:    Reviewed  Past Medical History  Surgical History: CABG ten years ago, sigmoid colectomy 2014 Medical Problems:  Heart problems, High Blood pressure, atrial fibrillation Allergies: nkda Medications: lasix, potassiumn, carvedilol, coumadin (which has been stopped)   Social History:Reviewed  Social History  Preferred Language: English Ethnicity: Not Hispanic / Latino Age: 73 Years 7 Months Marital Status:  M Alcohol:  No Recreational drug(s):  No   Smoking Status: Former smoker reviewed on 11/30/2013 Started Date: 10/12/1988 Stopped Date: 10/13/1999 Functional Status reviewed on 11/30/2013 ------------------------------------------------ Bathing: Normal Cooking: Normal Dressing: Normal Driving: Normal Eating: Normal Managing Meds: Normal Oral Care: Normal Shopping: Normal Toileting: Normal Transferring: Normal Walking: Normal Cognitive Status reviewed on 11/30/2013 ------------------------------------------------ Attention: Normal Decision Making: Normal Language:  Normal Memory: Normal Motor: Normal Perception: Normal Problem Solving: Normal Visual and Spatial: Normal   Family History:  Reviewed  Family Health History Family History is Unknown    Objective Information: General:  Well appearing, well nourished in no distress. Neck:  Supple without lymphadenopathy.    Irregularly irregular rhythym Lungs:    CTA bilaterally, no wheezes, rhonchi, rales.  Breathing unlabored. Abdomen:Soft, NT/ND, normal bowel sounds, no HSM, no masses.  No peritoneal signs.  Assessment:Colon neoplasm, transverse colon  Mutliple colon polyps  h/o colon cancer  Diagnoses: 239.0 Neoplasm of colon (Neoplasm of unspecified behavior of digestive system)  Procedures: 99214 - OFFICE OUTPATIENT VISIT 25 MINUTES    Plan:  Scheduled for total colectomy on 12/20/13.  Will stop coumadin one week prior to procedure.   Patient Education:Alternative treatments to surgery were discussed with patient (and family).  Risks and benefits  of procedure including bleeding, infection, cardiopulmonary difficulties, blood transfusion, and the possibility of an ostomy were fully explained to the patient (and family) who gave informed consent. Patient/family questions were addressed.  Follow-up:Pending Surgery

## 2013-12-05 ENCOUNTER — Telehealth: Payer: Self-pay | Admitting: *Deleted

## 2013-12-05 NOTE — Telephone Encounter (Signed)
Called and spoke with Lorenza Chick about another pt and she asked me if we have an appt for this pt.  I looked and didn't see one, so I called Tiffany to make her aware that they were in question about this appt.  She will take care of it.

## 2013-12-08 ENCOUNTER — Encounter (HOSPITAL_COMMUNITY): Payer: Self-pay

## 2013-12-08 ENCOUNTER — Encounter (HOSPITAL_COMMUNITY)
Admission: RE | Admit: 2013-12-08 | Discharge: 2013-12-08 | Disposition: A | Discharge status: Home / Self Care | Payer: Medicare Other | Source: Ambulatory Visit | Attending: General Surgery | Admitting: General Surgery

## 2013-12-08 DIAGNOSIS — Z01812 Encounter for preprocedural laboratory examination: Secondary | ICD-10-CM | POA: Insufficient documentation

## 2013-12-08 HISTORY — DX: Gastro-esophageal reflux disease without esophagitis: K21.9

## 2013-12-08 HISTORY — DX: Anxiety disorder, unspecified: F41.9

## 2013-12-08 LAB — COMPREHENSIVE METABOLIC PANEL
ALBUMIN: 3.7 g/dL (ref 3.5–5.2)
ALK PHOS: 66 U/L (ref 39–117)
ALT: 11 U/L (ref 0–53)
AST: 14 U/L (ref 0–37)
BUN: 22 mg/dL (ref 6–23)
CALCIUM: 8.8 mg/dL (ref 8.4–10.5)
CO2: 29 mEq/L (ref 19–32)
Chloride: 101 mEq/L (ref 96–112)
Creatinine, Ser: 1.58 mg/dL — ABNORMAL HIGH (ref 0.50–1.35)
GFR calc non Af Amer: 41 mL/min — ABNORMAL LOW (ref >90)
GFR, EST AFRICAN AMERICAN: 48 mL/min — AB (ref >90)
Glucose, Bld: 141 mg/dL — ABNORMAL HIGH (ref 70–99)
POTASSIUM: 4.8 meq/L (ref 3.7–5.3)
Sodium: 140 mEq/L (ref 137–147)
Total Bilirubin: 0.7 mg/dL (ref 0.3–1.2)
Total Protein: 6.9 g/dL (ref 6.0–8.3)

## 2013-12-08 LAB — CBC WITH DIFFERENTIAL/PLATELET
BASOS ABS: 0 10*3/uL (ref 0.0–0.1)
Basophils Relative: 1 % (ref 0–1)
EOS ABS: 0.1 10*3/uL (ref 0.0–0.7)
Eosinophils Relative: 2 % (ref 0–5)
HCT: 41.4 % (ref 39.0–52.0)
Hemoglobin: 13.7 g/dL (ref 13.0–17.0)
Lymphocytes Relative: 22 % (ref 12–46)
Lymphs Abs: 1.2 10*3/uL (ref 0.7–4.0)
MCH: 30.8 pg (ref 26.0–34.0)
MCHC: 33.1 g/dL (ref 30.0–36.0)
MCV: 93 fL (ref 78.0–100.0)
Monocytes Absolute: 0.5 10*3/uL (ref 0.1–1.0)
Monocytes Relative: 10 % (ref 3–12)
NEUTROS ABS: 3.6 10*3/uL (ref 1.7–7.7)
NEUTROS PCT: 65 % (ref 43–77)
Platelets: 102 10*3/uL — ABNORMAL LOW (ref 150–400)
RBC: 4.45 MIL/uL (ref 4.22–5.81)
RDW: 13.8 % (ref 11.5–15.5)
WBC: 5.5 10*3/uL (ref 4.0–10.5)

## 2013-12-08 LAB — PROTIME-INR
INR: 1.4 (ref 0.00–1.49)
PROTHROMBIN TIME: 16.8 s — AB (ref 11.6–15.2)

## 2013-12-08 LAB — PREPARE RBC (CROSSMATCH)

## 2013-12-08 NOTE — Patient Instructions (Addendum)
Cory Ryan  12/08/2013   Your procedure is scheduled on: 12/13/13  Report to [facility] [place] AM.  Call this number if you have problems the morning of surgery: [phone]   Remember:   Do not eat food or drink liquids after midnight.   Take these medicines the morning of surgery with A SIP OF WATER: xanax, coreg, norco.  Use albuterol inhaler & bring it with you.   Do not wear jewelry, make-up or nail polish.  Do not wear lotions, powders, or perfumes. You may wear deodorant.  Do not shave 48 hours prior to surgery. Men may shave face and neck.  Do not bring valuables to the hospital.  [facility] is not responsible                  for any belongings or valuables.               Contacts, dentures or bridgework may not be worn into surgery.  Leave suitcase in the car. After surgery it may be brought to your room.  For patients admitted to the hospital, discharge time is determined by your                treatment team.               Patients discharged the day of surgery will not be allowed to drive  home.  Name and phone number of your driver: family  Special Instructions: Shower using CHG 2 nights before surgery and the night before surgery.  If you shower the day of surgery use CHG.  Use special wash - you have one bottle of CHG for all showers.  You should use approximately 1/3 of the bottle for each shower.   Please read over the following fact sheets that you were given: Pain Booklet, Surgical Site Infection Prevention, Anesthesia Post-op Instructions and Care and Recovery After Surgery   How to Use Chlorhexidine Before Surgery Chlorhexidine is a germ-killing(antiseptic) solution that is used to clean the skin. It gets rid of the bacteria that usually live on the skin. Because your skin needs to be very clean before you have surgery, your caregiver may give you chlorhexidine to wash with before the surgery. You may be given chlorhexidine to use at home. For example, you may  be told to wash with chlorhexidine the night before and the morning of your surgery. Washing with chlorhexidine before surgery helps prevent infections from developing after the surgery.  HOW TO USE CHLORHEXIDINE  Only use chlorhexidine as directed by your caregiver. Do not use more chlorhexidine than you are told to, and do not use it more often than you are told. Make sure you follow the instructions on the label. If you have any questions, call your caregiver. You can use chlorhexidine while taking a shower or a bath. Unless instructed otherwise, follow these steps when using chlorhexidine: 1. Wash your face and your hair using the soap and shampoo that you normally use. 2. Turn off the shower or stand up in the tub. Then, wash with chlorhexidine. To wash with chlorhexidine:  If you were given a special sponge, use that. If you were not given a sponge, use only a cloth. Do not use any sort of brush. If the sponge has a brush on one side, do not use that side.  Start at your neck and scrub down to your toes. Avoid your genital area. Chlorhexidine can cause a skin reaction.  Also, avoid any areas of skin that have cuts or scrapes.  Pay special attention to the part of your body where you will be having surgery. Scrub this area for at least 1 minute.  Be sure to get your back and under your arms.  Do not use chlorhexidine on your head. If the solution gets into your ears or eyes, rinse them well with water. Chlorhexidine can cause hearing lossif the solution gets into your ear. It can also harm your eye if it gets in your eye and is not rinsed out. 3. Rinse your body completely in the shower or tub. Be sure that all body creases and crevices are rinsed well. 4. Dry off with a clean towel. Do not put any substances on your body afterward, such as powder, lotion, or perfume. 5. Put on clean clothing. 6. If it is the night before your surgery, sleep in clean sheets. SEEK MEDICAL CARE IF:   Your  skin gets irritated after scrubbing. SEEK IMMEDIATE MEDICAL CARE IF:   Your eyes become very red or swollen.  Your eyes itch badly.  Your skin itches badly and is red or swollen.  Your hearing changes.  You have trouble seeing.  You have trouble breathing.  You swallow any chlorhexidine. Document Released: 06/22/2012 Document Revised: 09/14/2012 Document Reviewed: 06/22/2012 [facility] Patient Information 2014 [place]. PATIENT INSTRUCTIONS POST-ANESTHESIA  IMMEDIATELY FOLLOWING SURGERY:  Do not drive or operate machinery for the first twenty four hours after surgery.  Do not make any important decisions for twenty four hours after surgery or while taking narcotic pain medications or sedatives.  If you develop intractable nausea and vomiting or a severe headache please notify your doctor immediately.  FOLLOW-UP:  Please make an appointment with your surgeon as instructed. You do not need to follow up with anesthesia unless specifically instructed to do so.  WOUND CARE INSTRUCTIONS (if applicable):  Keep a dry clean dressing on the anesthesia/puncture wound site if there is drainage.  Once the wound has quit draining you may leave it open to air.  Generally you should leave the bandage intact for twenty four hours unless there is drainage.  If the epidural site drains for more than 36-48 hours please call the anesthesia department.  QUESTIONS?:  Please feel free to call your physician or the hospital operator if you have any questions, and they will be happy to assist you.      Open Colectomy An open colectomy is surgery to take out part or all of the large intestine (colon). This procedure is used to treat several conditions, including:  Inflammation and infection of the colon (diverticulitis).  Tumors or masses in the colon.  Inflammatory bowel disease, such as Crohn disease or ulcerative colitis. Colectomy is an option when symptoms cannot be controlled with  medicines.  Bleeding from the colon that cannot be controlled by another method.  Blockage or obstruction of the colon. LET [facility] CARE PROVIDER KNOW ABOUT:  Any allergies you have.  All medicines you are taking, including vitamins, herbs, eye drops, creams, and over-the-counter medicines.  Previous problems you or members of your family have had with the use of anesthetics.  Any blood disorders you have.  Previous surgeries you have had.  Medical conditions you have. RISKS AND COMPLICATIONS  Generally, this is a safe procedure. However, as with any procedure, complications can occur. Possible complications include:  An infection developing in the area where the surgery was done.  Problems with the  incisions, including:  Bleeding from an incision.  The wound reopening.  Tissues from inside the abdomen bulging through the incision (hernia).  Bleeding inside the abdomen.  Reopening of the colon where it was stitched or stapled together. This is a serious complication. Another procedure may be needed to fix the problem.  Damage to other organs in the abdomen.  A blood clot forming in a vein and traveling to the lungs.  Future blockage of the small intestine from scar tissue. Another surgery may be needed to repair this. BEFORE THE PROCEDURE  Various tests may be done before the procedure. These may include:  Blood tests.  A test to check the heart's rhythm (electrocardiography).  A CT scan to get pictures of your abdomen.  Ask your health care provider about changing or stopping any regular medicines. You will need to stop taking aspirin and nonsteroidal anti-inflammatory drugs (NSAIDs) at least 5 days before the surgery. You will also need to stop taking any blood thinners and vitamin E.  You may be prescribed an oral bowel prep. This involves drinking a large amount of medicated liquid, starting the day before your surgery. The liquid will cause you to have  multiple loose stools until your stool is almost clear or light green. This cleans out your colon in preparation for the surgery.  Do not eat or drink anything else once you have started the bowel prep, unless your health care provider tells you it is safe to do so.  You may also be given antibiotic pills to clean out your colon of bacteria. Be sure to follow the directions carefully and take the medicine at the correct time.  Make plans to have someone drive you home after your hospital stay. Also arrange for someone to help you with activities during your recovery. PROCEDURE This surgery can take 2 4 hours.  Small monitors will be put on your body. They are used to check your heart, blood pressure, and oxygen level.  An IV access tube will be put into one of your veins. Medicine will be able to flow directly into your body through this IV tube.  You might be given a medicine to help you relax (sedative).  You will be given a medicine to make you sleep through the procedure (general anesthetic). A breathing tube may be placed into your lungs during the procedure.  A thin, flexible tube (catheter) will be placed into your bladder to collect urine.  A tube may be put in through your nose. It is called a nasogastric tube. It is used to remove stomach fluids after surgery until the intestines start working again.  Once you are asleep, the surgeon will make an incision in the abdomen.  Clamps or staples are put on both ends of the diseased part of the colon.  The part of the intestine between the clamps or staples is removed.  If possible, the ends of the healthy colon that remain will be stitched or stapled together to allow your body to expel waste (stool).  Sometimes, the remaining colon cannot be stitched back together. If this is the case, a colostomy is needed. For a colostomy:  An opening (stoma) to the outside of your body is made through the abdomen.  The end of the colon is  brought to the opening. It is stitched to the skin.  A bag is attached to the opening. Stool will drain into this bag. The bag is removable.  The colostomy can be temporary  or permanent.  The incision from the colectomy will be closed with stitches or staples. AFTER THE PROCEDURE  You will stay in a recovery area until the anesthetic has worn off. Your blood pressure and pulse will be checked often. Then you will be taken to a hospital room.  You will continue to get fluids through the IV tube for a while. The IV tube will be taken out when the colon starts working again.  You will start on clear liquids and gradually go back to a normal diet.  You will be encouraged to cough and to take deep breaths to open your lungs and prevent pneumonia.  Some pain is normal after a colectomy. You will be given pain medicine as needed.  You will be urged to get up and start walking within a day.  If you had a colostomy, your health care provider will explain how it works and what you will need to do.  You will likely need to stay in the hospital for 3 7 days. Document Released: 07/26/2009 Document Revised: 07/19/2013 Document Reviewed: 05/10/2013 [facility] Patient Information 2014 [place].

## 2013-12-09 LAB — CEA: CEA: 0.9 ng/mL (ref 0.0–5.0)

## 2013-12-11 ENCOUNTER — Other Ambulatory Visit (HOSPITAL_COMMUNITY): Payer: Medicare Other

## 2013-12-11 NOTE — Progress Notes (Signed)
Pt's pre-op labwork showed a platelet count of 102. 2 units of PRBC have been crossmatched and the blood bank is preparing them this morning for surgery on 3/4. Dr. Patsey Berthold notified, no orders given.

## 2013-12-13 ENCOUNTER — Inpatient Hospital Stay (HOSPITAL_COMMUNITY)
Admission: RE | Admit: 2013-12-13 | Discharge: 2013-12-18 | DRG: 329 | Disposition: A | Discharge status: Home Health | Payer: Medicare Other | Source: Ambulatory Visit | Attending: General Surgery | Admitting: General Surgery

## 2013-12-13 ENCOUNTER — Inpatient Hospital Stay (HOSPITAL_COMMUNITY): Payer: Medicare Other | Admitting: Anesthesiology

## 2013-12-13 ENCOUNTER — Encounter (HOSPITAL_COMMUNITY): Payer: Self-pay

## 2013-12-13 ENCOUNTER — Encounter (HOSPITAL_COMMUNITY): Payer: Medicare Other | Admitting: Anesthesiology

## 2013-12-13 ENCOUNTER — Encounter (HOSPITAL_COMMUNITY)
Admission: RE | Disposition: A | Discharge status: Home Health | Payer: Self-pay | Source: Ambulatory Visit | Attending: General Surgery

## 2013-12-13 DIAGNOSIS — E43 Unspecified severe protein-calorie malnutrition: Secondary | ICD-10-CM | POA: Insufficient documentation

## 2013-12-13 DIAGNOSIS — I10 Essential (primary) hypertension: Secondary | ICD-10-CM | POA: Diagnosis present

## 2013-12-13 DIAGNOSIS — Z7901 Long term (current) use of anticoagulants: Secondary | ICD-10-CM

## 2013-12-13 DIAGNOSIS — Z6825 Body mass index (BMI) 25.0-25.9, adult: Secondary | ICD-10-CM

## 2013-12-13 DIAGNOSIS — I4891 Unspecified atrial fibrillation: Secondary | ICD-10-CM | POA: Diagnosis present

## 2013-12-13 DIAGNOSIS — Z87891 Personal history of nicotine dependence: Secondary | ICD-10-CM

## 2013-12-13 DIAGNOSIS — R1013 Epigastric pain: Secondary | ICD-10-CM

## 2013-12-13 DIAGNOSIS — Z951 Presence of aortocoronary bypass graft: Secondary | ICD-10-CM

## 2013-12-13 DIAGNOSIS — Z85038 Personal history of other malignant neoplasm of large intestine: Secondary | ICD-10-CM | POA: Diagnosis present

## 2013-12-13 DIAGNOSIS — C189 Malignant neoplasm of colon, unspecified: Principal | ICD-10-CM | POA: Diagnosis present

## 2013-12-13 DIAGNOSIS — N289 Disorder of kidney and ureter, unspecified: Secondary | ICD-10-CM | POA: Diagnosis present

## 2013-12-13 DIAGNOSIS — J449 Chronic obstructive pulmonary disease, unspecified: Secondary | ICD-10-CM

## 2013-12-13 DIAGNOSIS — K3189 Other diseases of stomach and duodenum: Secondary | ICD-10-CM | POA: Diagnosis not present

## 2013-12-13 HISTORY — PX: COLECTOMY: SHX59

## 2013-12-13 LAB — HEMOGLOBIN AND HEMATOCRIT, BLOOD
HCT: 39.4 % (ref 39.0–52.0)
HEMOGLOBIN: 12.9 g/dL — AB (ref 13.0–17.0)

## 2013-12-13 SURGERY — COLECTOMY, TOTAL
Anesthesia: General | Site: Abdomen

## 2013-12-13 MED ORDER — SUCCINYLCHOLINE CHLORIDE 20 MG/ML IJ SOLN
INTRAMUSCULAR | Status: DC | PRN
  Administered 2013-12-13: 100 mg via INTRAVENOUS

## 2013-12-13 MED ORDER — ROCURONIUM BROMIDE 50 MG/5ML IV SOLN
INTRAVENOUS | Status: AC
  Filled 2013-12-13: qty 1

## 2013-12-13 MED ORDER — ENOXAPARIN SODIUM 40 MG/0.4ML ~~LOC~~ SOLN
SUBCUTANEOUS | Status: AC
  Filled 2013-12-13: qty 0.4

## 2013-12-13 MED ORDER — FENTANYL CITRATE 0.05 MG/ML IJ SOLN
INTRAMUSCULAR | Status: AC
  Filled 2013-12-13: qty 5

## 2013-12-13 MED ORDER — ONDANSETRON HCL 4 MG/2ML IJ SOLN: 4.0000 mg | Freq: Once | INTRAMUSCULAR | Status: DC | PRN

## 2013-12-13 MED ORDER — 0.9 % SODIUM CHLORIDE (POUR BTL) OPTIME
TOPICAL | Status: DC | PRN
  Administered 2013-12-13: 2000 mL
  Administered 2013-12-13 (×2): 1000 mL

## 2013-12-13 MED ORDER — POVIDONE-IODINE 10 % EX OINT
TOPICAL_OINTMENT | CUTANEOUS | Status: AC
  Filled 2013-12-13: qty 1

## 2013-12-13 MED ORDER — CARVEDILOL 3.125 MG PO TABS
6.2500 mg | ORAL_TABLET | Freq: Two times a day (BID) | ORAL | Status: DC
  Administered 2013-12-13 – 2013-12-18 (×10): 6.25 mg via ORAL
  Filled 2013-12-13 (×11): qty 2

## 2013-12-13 MED ORDER — FENTANYL CITRATE 0.05 MG/ML IJ SOLN
INTRAMUSCULAR | Status: AC
  Filled 2013-12-13: qty 2

## 2013-12-13 MED ORDER — GLYCOPYRROLATE 0.2 MG/ML IJ SOLN
INTRAMUSCULAR | Status: DC | PRN
  Administered 2013-12-13: 0.6 mg via INTRAVENOUS

## 2013-12-13 MED ORDER — SUCCINYLCHOLINE CHLORIDE 20 MG/ML IJ SOLN
INTRAMUSCULAR | Status: AC
  Filled 2013-12-13: qty 1

## 2013-12-13 MED ORDER — CIPROFLOXACIN IN D5W 400 MG/200ML IV SOLN
INTRAVENOUS | Status: AC
  Filled 2013-12-13: qty 200

## 2013-12-13 MED ORDER — ONDANSETRON HCL 4 MG/2ML IJ SOLN
4.0000 mg | Freq: Four times a day (QID) | INTRAMUSCULAR | Status: DC | PRN
  Administered 2013-12-14 – 2013-12-16 (×3): 4 mg via INTRAVENOUS
  Filled 2013-12-13 (×3): qty 2

## 2013-12-13 MED ORDER — METOPROLOL TARTRATE 1 MG/ML IV SOLN
INTRAVENOUS | Status: DC | PRN
  Administered 2013-12-13 (×5): 1 mg via INTRAVENOUS

## 2013-12-13 MED ORDER — LIDOCAINE HCL (PF) 1 % IJ SOLN
INTRAMUSCULAR | Status: AC
  Filled 2013-12-13: qty 5

## 2013-12-13 MED ORDER — LACTATED RINGERS IV SOLN
INTRAVENOUS | Status: DC
  Administered 2013-12-13: 17:00:00 via INTRAVENOUS

## 2013-12-13 MED ORDER — FUROSEMIDE 40 MG PO TABS
40.0000 mg | ORAL_TABLET | Freq: Two times a day (BID) | ORAL | Status: DC
  Administered 2013-12-13 – 2013-12-18 (×10): 40 mg via ORAL
  Filled 2013-12-13 (×10): qty 1

## 2013-12-13 MED ORDER — ONDANSETRON HCL 4 MG/2ML IJ SOLN
4.0000 mg | Freq: Once | INTRAMUSCULAR | Status: AC
  Administered 2013-12-13: 4 mg via INTRAVENOUS

## 2013-12-13 MED ORDER — ALUM & MAG HYDROXIDE-SIMETH 200-200-20 MG/5ML PO SUSP
15.0000 mL | ORAL | Status: DC | PRN
  Administered 2013-12-14 – 2013-12-17 (×9): 15 mL via ORAL
  Filled 2013-12-13 (×9): qty 30

## 2013-12-13 MED ORDER — MIDAZOLAM HCL 2 MG/2ML IJ SOLN
1.0000 mg | INTRAMUSCULAR | Status: DC | PRN
  Administered 2013-12-13: 2 mg via INTRAVENOUS

## 2013-12-13 MED ORDER — ROCURONIUM BROMIDE 100 MG/10ML IV SOLN
INTRAVENOUS | Status: DC | PRN
  Administered 2013-12-13 (×2): 10 mg via INTRAVENOUS
  Administered 2013-12-13: 20 mg via INTRAVENOUS
  Administered 2013-12-13: 10 mg via INTRAVENOUS

## 2013-12-13 MED ORDER — MORPHINE SULFATE 4 MG/ML IJ SOLN
2.0000 mg | INTRAMUSCULAR | Status: DC | PRN
  Administered 2013-12-16: 2 mg via INTRAVENOUS
  Filled 2013-12-13: qty 1

## 2013-12-13 MED ORDER — LACTATED RINGERS IV SOLN
INTRAVENOUS | Status: DC
  Administered 2013-12-13: 07:00:00 via INTRAVENOUS

## 2013-12-13 MED ORDER — FENTANYL CITRATE 0.05 MG/ML IJ SOLN
25.0000 ug | INTRAMUSCULAR | Status: DC | PRN
  Administered 2013-12-13 (×3): 50 ug via INTRAVENOUS
  Filled 2013-12-13 (×2): qty 2

## 2013-12-13 MED ORDER — FENTANYL CITRATE 0.05 MG/ML IJ SOLN
INTRAMUSCULAR | Status: DC | PRN
  Administered 2013-12-13 (×3): 50 ug via INTRAVENOUS
  Administered 2013-12-13: 100 ug via INTRAVENOUS
  Administered 2013-12-13 (×7): 50 ug via INTRAVENOUS

## 2013-12-13 MED ORDER — ALVIMOPAN 12 MG PO CAPS
12.0000 mg | ORAL_CAPSULE | Freq: Once | ORAL | Status: AC
  Administered 2013-12-13: 12 mg via ORAL

## 2013-12-13 MED ORDER — ALVIMOPAN 12 MG PO CAPS
ORAL_CAPSULE | ORAL | Status: AC
  Filled 2013-12-13: qty 1

## 2013-12-13 MED ORDER — PHENYLEPHRINE HCL 10 MG/ML IJ SOLN
INTRAMUSCULAR | Status: AC
  Filled 2013-12-13: qty 1

## 2013-12-13 MED ORDER — METOPROLOL TARTRATE 1 MG/ML IV SOLN
INTRAVENOUS | Status: AC
  Filled 2013-12-13: qty 5

## 2013-12-13 MED ORDER — ALVIMOPAN 12 MG PO CAPS
12.0000 mg | ORAL_CAPSULE | Freq: Two times a day (BID) | ORAL | Status: DC
  Administered 2013-12-14 (×2): 12 mg via ORAL
  Filled 2013-12-13 (×2): qty 1

## 2013-12-13 MED ORDER — IPRATROPIUM-ALBUTEROL 0.5-2.5 (3) MG/3ML IN SOLN
3.0000 mL | Freq: Four times a day (QID) | RESPIRATORY_TRACT | Status: DC | PRN
  Administered 2013-12-15: 3 mL via RESPIRATORY_TRACT
  Filled 2013-12-13: qty 3

## 2013-12-13 MED ORDER — HYDROCODONE-ACETAMINOPHEN 5-325 MG PO TABS
1.0000 | ORAL_TABLET | ORAL | Status: DC | PRN
  Administered 2013-12-13 – 2013-12-14 (×3): 1 via ORAL
  Administered 2013-12-14: 2 via ORAL
  Administered 2013-12-15 – 2013-12-18 (×3): 1 via ORAL
  Filled 2013-12-13: qty 1
  Filled 2013-12-13: qty 2
  Filled 2013-12-13 (×5): qty 1

## 2013-12-13 MED ORDER — ARTIFICIAL TEARS OP OINT
TOPICAL_OINTMENT | OPHTHALMIC | Status: DC | PRN
  Administered 2013-12-13: 1 via OPHTHALMIC

## 2013-12-13 MED ORDER — BUPIVACAINE LIPOSOME 1.3 % IJ SUSP
20.0000 mL | Freq: Once | INTRAMUSCULAR | Status: AC
  Administered 2013-12-13: 17 mL
  Filled 2013-12-13: qty 20

## 2013-12-13 MED ORDER — PHENYLEPHRINE HCL 10 MG/ML IJ SOLN
INTRAMUSCULAR | Status: DC | PRN
  Administered 2013-12-13 (×2): 100 ug via INTRAVENOUS

## 2013-12-13 MED ORDER — GLYCOPYRROLATE 0.2 MG/ML IJ SOLN
INTRAMUSCULAR | Status: AC
  Filled 2013-12-13: qty 3

## 2013-12-13 MED ORDER — ENOXAPARIN SODIUM 40 MG/0.4ML ~~LOC~~ SOLN
40.0000 mg | Freq: Once | SUBCUTANEOUS | Status: AC
  Administered 2013-12-13: 40 mg via SUBCUTANEOUS

## 2013-12-13 MED ORDER — LACTATED RINGERS IV SOLN
INTRAVENOUS | Status: DC | PRN
  Administered 2013-12-13 (×3): via INTRAVENOUS

## 2013-12-13 MED ORDER — CHLORHEXIDINE GLUCONATE 4 % EX LIQD: 1.0000 "application " | Freq: Once | CUTANEOUS | Status: DC

## 2013-12-13 MED ORDER — ONDANSETRON HCL 4 MG/2ML IJ SOLN
INTRAMUSCULAR | Status: AC
  Filled 2013-12-13: qty 2

## 2013-12-13 MED ORDER — KETOROLAC TROMETHAMINE 30 MG/ML IJ SOLN
30.0000 mg | Freq: Once | INTRAMUSCULAR | Status: AC
  Administered 2013-12-13: 30 mg via INTRAVENOUS
  Filled 2013-12-13: qty 1

## 2013-12-13 MED ORDER — POVIDONE-IODINE 10 % EX OINT
TOPICAL_OINTMENT | CUTANEOUS | Status: DC | PRN
  Administered 2013-12-13: 1 via TOPICAL

## 2013-12-13 MED ORDER — NEOSTIGMINE METHYLSULFATE 1 MG/ML IJ SOLN
INTRAMUSCULAR | Status: DC | PRN
  Administered 2013-12-13: 4 mg via INTRAVENOUS

## 2013-12-13 MED ORDER — ONDANSETRON HCL 4 MG PO TABS
4.0000 mg | ORAL_TABLET | Freq: Four times a day (QID) | ORAL | Status: DC | PRN
  Administered 2013-12-14 – 2013-12-16 (×3): 4 mg via ORAL
  Filled 2013-12-13 (×3): qty 1

## 2013-12-13 MED ORDER — HEMOSTATIC AGENTS (NO CHARGE) OPTIME
TOPICAL | Status: DC | PRN
  Administered 2013-12-13: 2 via TOPICAL

## 2013-12-13 MED ORDER — LIDOCAINE HCL (CARDIAC) 20 MG/ML IV SOLN
INTRAVENOUS | Status: DC | PRN
  Administered 2013-12-13: 50 mg via INTRAVENOUS

## 2013-12-13 MED ORDER — METRONIDAZOLE IN NACL 5-0.79 MG/ML-% IV SOLN
500.0000 mg | INTRAVENOUS | Status: AC
  Administered 2013-12-13: 500 mg via INTRAVENOUS

## 2013-12-13 MED ORDER — METRONIDAZOLE IN NACL 5-0.79 MG/ML-% IV SOLN
INTRAVENOUS | Status: AC
  Filled 2013-12-13: qty 100

## 2013-12-13 MED ORDER — MIDAZOLAM HCL 2 MG/2ML IJ SOLN
INTRAMUSCULAR | Status: AC
  Filled 2013-12-13: qty 2

## 2013-12-13 MED ORDER — SODIUM CHLORIDE BACTERIOSTATIC 0.9 % IJ SOLN
INTRAMUSCULAR | Status: AC
  Filled 2013-12-13: qty 20

## 2013-12-13 MED ORDER — CIPROFLOXACIN IN D5W 400 MG/200ML IV SOLN
400.0000 mg | INTRAVENOUS | Status: AC
  Administered 2013-12-13: 400 mg via INTRAVENOUS

## 2013-12-13 MED ORDER — ALPRAZOLAM 1 MG PO TABS
1.0000 mg | ORAL_TABLET | Freq: Three times a day (TID) | ORAL | Status: DC | PRN
  Administered 2013-12-14 (×2): 1 mg via ORAL
  Filled 2013-12-13 (×2): qty 1

## 2013-12-13 SURGICAL SUPPLY — 77 items
APPLIER CLIP 11 MED OPEN (CLIP)
APPLIER CLIP 13 LRG OPEN (CLIP) ×3
APR CLP LRG 13 20 CLIP (CLIP) ×1
APR CLP MED 11 20 MLT OPN (CLIP)
BAG HAMPER (MISCELLANEOUS) ×3 IMPLANT
BARRIER SKIN 2 3/4 (OSTOMY) IMPLANT
BARRIER SKIN 2 3/4 INCH (OSTOMY)
BARRIER SKIN WITH FLANGE 1 3/4 (OSTOMY) IMPLANT
BRR SKN FLT 2.75X2.25 2 PC (OSTOMY)
CHLORAPREP W/TINT 26ML (MISCELLANEOUS) ×3 IMPLANT
CLAMP POUCH DRAINAGE QUIET (OSTOMY) IMPLANT
CLIP APPLIE 11 MED OPEN (CLIP) IMPLANT
CLIP APPLIE 13 LRG OPEN (CLIP) ×1 IMPLANT
CLOTH BEACON ORANGE TIMEOUT ST (SAFETY) ×3 IMPLANT
COVER LIGHT HANDLE STERIS (MISCELLANEOUS) ×6 IMPLANT
COVER MAYO STAND XLG (DRAPE) ×3 IMPLANT
DRAPE WARM FLUID 44X44 (DRAPE) ×3 IMPLANT
DRSG OPSITE POSTOP 4X10 (GAUZE/BANDAGES/DRESSINGS) ×2 IMPLANT
ELECT BLADE 6 FLAT ULTRCLN (ELECTRODE) ×3 IMPLANT
ELECT REM PT RETURN 9FT ADLT (ELECTROSURGICAL) ×3
ELECTRODE REM PT RTRN 9FT ADLT (ELECTROSURGICAL) ×1 IMPLANT
FORMALIN 10 PREFIL 480ML (MISCELLANEOUS) IMPLANT
GLOVE BIO SURGEON STRL SZ7.5 (GLOVE) ×6 IMPLANT
GLOVE BIOGEL PI IND STRL 7.0 (GLOVE) ×4 IMPLANT
GLOVE BIOGEL PI IND STRL 7.5 (GLOVE) IMPLANT
GLOVE BIOGEL PI IND STRL 8 (GLOVE) IMPLANT
GLOVE BIOGEL PI INDICATOR 7.0 (GLOVE) ×8
GLOVE BIOGEL PI INDICATOR 7.5 (GLOVE) ×2
GLOVE BIOGEL PI INDICATOR 8 (GLOVE) ×4
GLOVE ECLIPSE 7.0 STRL STRAW (GLOVE) ×4 IMPLANT
GLOVE SS BIOGEL STRL SZ 6.5 (GLOVE) ×2 IMPLANT
GLOVE SUPERSENSE BIOGEL SZ 6.5 (GLOVE) ×4
GOWN STRL REUS W/TWL LRG LVL3 (GOWN DISPOSABLE) ×18 IMPLANT
HEMOSTAT SURGICEL 4X8 (HEMOSTASIS) ×4 IMPLANT
INST SET MAJOR GENERAL (KITS) ×3 IMPLANT
KIT ROOM TURNOVER APOR (KITS) ×3 IMPLANT
LIGASURE IMPACT 36 18CM CVD LR (INSTRUMENTS) ×3 IMPLANT
MANIFOLD NEPTUNE II (INSTRUMENTS) ×3 IMPLANT
NEEDLE HYPO 18GX1.5 BLUNT FILL (NEEDLE) ×3 IMPLANT
NEEDLE HYPO 25X1 1.5 SAFETY (NEEDLE) ×3 IMPLANT
NS IRRIG 1000ML POUR BTL (IV SOLUTION) ×10 IMPLANT
PACK ABDOMINAL MAJOR (CUSTOM PROCEDURE TRAY) ×3 IMPLANT
PAD ARMBOARD 7.5X6 YLW CONV (MISCELLANEOUS) ×3 IMPLANT
PENCIL HANDSWITCHING (ELECTRODE) ×5 IMPLANT
POUCH OSTOMY 2 3/4  H 3804 (WOUND CARE)
POUCH OSTOMY 2 3/4 H 3804 (WOUND CARE)
POUCH OSTOMY 2 PC DRNBL 2.25 (WOUND CARE) IMPLANT
POUCH OSTOMY 2 PC DRNBL 2.75 (WOUND CARE) IMPLANT
POUCH OSTOMY DRNBL 2 1/4 (WOUND CARE)
RELOAD LINEAR CUT PROX 55 BLUE (ENDOMECHANICALS) IMPLANT
RELOAD PROXIMATE 75MM BLUE (ENDOMECHANICALS) ×6 IMPLANT
RELOAD STAPLE 55 3.8 BLU REG (ENDOMECHANICALS) IMPLANT
RELOAD STAPLE 75 3.8 BLU REG (ENDOMECHANICALS) IMPLANT
RETRACTOR WND ALEXIS 25 LRG (MISCELLANEOUS) IMPLANT
RTRCTR WOUND ALEXIS 25CM LRG (MISCELLANEOUS) ×3
SET BASIN LINEN APH (SET/KITS/TRAYS/PACK) ×3 IMPLANT
SPONGE GAUZE 4X4 12PLY (GAUZE/BANDAGES/DRESSINGS) IMPLANT
SPONGE LAP 18X18 X RAY DECT (DISPOSABLE) ×6 IMPLANT
SPONGE SURGIFOAM ABS GEL 100 (HEMOSTASIS) ×4 IMPLANT
SPONGE SURGIFOAM ABS GEL 12-7 (HEMOSTASIS) IMPLANT
STAPLER GUN LINEAR PROX 60 (STAPLE) ×2 IMPLANT
STAPLER PROXIMATE 55 BLUE (STAPLE) IMPLANT
STAPLER PROXIMATE 75MM BLUE (STAPLE) ×3 IMPLANT
STAPLER VISISTAT (STAPLE) ×3 IMPLANT
SUCTION POOLE TIP (SUCTIONS) ×3 IMPLANT
SUT CHROMIC 0 SH (SUTURE) ×1 IMPLANT
SUT CHROMIC 3 0 SH 27 (SUTURE) ×1 IMPLANT
SUT NOVA NAB GS-26 0 60 (SUTURE) ×6 IMPLANT
SUT SILK 2 0 (SUTURE)
SUT SILK 2 0 REEL (SUTURE) IMPLANT
SUT SILK 2-0 18XBRD TIE 12 (SUTURE) IMPLANT
SUT SILK 3 0 SH CR/8 (SUTURE) ×4 IMPLANT
SYR 20CC LL (SYRINGE) ×2 IMPLANT
TOWEL BLUE STERILE X RAY DET (MISCELLANEOUS) ×3 IMPLANT
TOWEL OR 17X26 4PK STRL BLUE (TOWEL DISPOSABLE) ×3 IMPLANT
TRAY FOLEY CATH 16FR SILVER (SET/KITS/TRAYS/PACK) ×3 IMPLANT
YANKAUER SUCT BULB TIP NO VENT (SUCTIONS) ×3 IMPLANT

## 2013-12-13 NOTE — Anesthesia Postprocedure Evaluation (Signed)
  Anesthesia Post-op Note  Patient: Cory Ryan  Procedure(s) Performed: Procedure(s): TOTAL COLECTOMY (N/A)  Patient Location: PACU  Anesthesia Type:General  Level of Consciousness: awake, alert  and patient cooperative  Airway and Oxygen Therapy: Patient Spontanous Breathing and Patient connected to face mask oxygen  Post-op Pain: moderate  Post-op Assessment: Post-op Vital signs reviewed, Patient's Cardiovascular Status Stable, Respiratory Function Stable, Patent Airway, No signs of Nausea or vomiting and Pain level controlled  Post-op Vital Signs: Reviewed and stable  Complications: No apparent anesthesia complications

## 2013-12-13 NOTE — Preoperative (Signed)
Beta Blockers   Reason not to administer Beta Blockers:Not Applicable 

## 2013-12-13 NOTE — Transfer of Care (Signed)
Immediate Anesthesia Transfer of Care Note  Patient: Cory Ryan  Procedure(s) Performed: Procedure(s): TOTAL COLECTOMY (N/A)  Patient Location: PACU  Anesthesia Type:General  Level of Consciousness: awake, alert  and patient cooperative  Airway & Oxygen Therapy: Patient Spontanous Breathing and Patient connected to face mask oxygen  Post-op Assessment: Report given to PACU RN and Post -op Vital signs reviewed and stable  Post vital signs: Reviewed and stable  Complications: No apparent anesthesia complications
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
## 2013-12-13 NOTE — Interval H&P Note (Signed)
History and Physical Interval Note:  12/13/2013 7:20 AM  Cory Ryan  has presented today for surgery, with the diagnosis of colon neoplasm  The various methods of treatment have been discussed with the patient and family. After consideration of risks, benefits and other options for treatment, the patient has consented to  Procedure(s): TOTAL COLECTOMY (N/A) as a surgical intervention .  The patient's history has been reviewed, patient examined, no change in status, stable for surgery.  I have reviewed the patient's chart and labs.  Questions were answered to the patient's satisfaction.     Aviva Signs A

## 2013-12-13 NOTE — Anesthesia Preprocedure Evaluation (Signed)
Anesthesia Evaluation  Patient identified by MRN, date of birth, ID band Patient awake    Reviewed: Allergy & Precautions, H&P , NPO status , Patient's Chart, lab work & pertinent test results  Airway Mallampati: II TM Distance: >3 FB     Dental  (+) Edentulous Upper, Poor Dentition, Chipped, Missing, Loose, Dental Advisory Given   Pulmonary shortness of breath, COPDformer smoker,  breath sounds clear to auscultation        Cardiovascular hypertension, Pt. on medications + CAD, + Past MI, + CABG and +CHF + dysrhythmias Atrial Fibrillation + Valvular Problems/Murmurs MR and AS Rhythm:Regular Rate:Normal     Neuro/Psych    GI/Hepatic PUD, GERD-  Medicated and Controlled,  Endo/Other  Hypothyroidism   Renal/GU Renal disease     Musculoskeletal   Abdominal   Peds  Hematology   Anesthesia Other Findings   Reproductive/Obstetrics                           Anesthesia Physical Anesthesia Plan  ASA: IV  Anesthesia Plan: General   Post-op Pain Management:    Induction: Intravenous  Airway Management Planned: Oral ETT  Additional Equipment:   Intra-op Plan:   Post-operative Plan: Extubation in OR  Informed Consent: I have reviewed the patients History and Physical, chart, labs and discussed the procedure including the risks, benefits and alternatives for the proposed anesthesia with the patient or authorized representative who has indicated his/her understanding and acceptance.   Dental advisory given  Plan Discussed with:   Anesthesia Plan Comments: (Amidate induction)        Anesthesia Quick Evaluation

## 2013-12-13 NOTE — Op Note (Signed)
Patient:  Cory Ryan  DOB:  19-Dec-1940  MRN:  [id number]   Preop Diagnosis:  Colon neoplasm, polyposis of colon, history of colon carcinoma  Postop Diagnosis:  Same  Procedure:  Near total colectomy  Surgeon:  Aviva Signs, M.D.  Anes:  General endotracheal  Indications:  Patient is a 73 year old white male status post a sigmoid colon resection for colovesical fistula which ended up having a T2, N0, M0 adenocarcinoma of the colon who now presents with multiple polyps in the colon as well as a large mass in the descending portion the colon. No biopsies were positive for malignancy, but given his extensive number of polypoid lesions, the patient now comes the operating room for a total colectomy. The risks and benefits of the procedure including bleeding, infection, cardiopulmonary difficulties, the possibility of a blood transfusion, stroke, and the possibility of an ostomy were fully explained to the patient, who gave informed consent.  Procedure note:  The patient is placed the supine position. After induction of general endotracheal anesthesia, the abdomen was prepped and draped using usual sterile technique with DuraPrep. Surgical site confirmation was performed.  A midline incision was made from just above the umbilicus to the suprapubic region. The peritoneal cavity was entered into without difficulty. The liver was inspected and noted within normal limits. The gallbladder was within normal limits. Multiple areas along the colon were noted to have been tattooed. The first turned my attention to the pelvis. Thickened tissue was noted in the distal portion of the colon. Around the bladder, the colon could not be fully mobilized, almost like a frozen pelvis. This did not appear to be malignant in nature, but rather postsurgical in nature. Dense adhesive disease was noted. This limited my ability to mobilize the rectum. I then proceeded to dissect the left and right colons along the peritoneal  reflection. Both the appendix collection the splenic flexure were taken down using the LigaSure. A GIA stapler was placed across the terminal ileum and fired. The mesentery of the descending colon, transverse colon, descending colon regions were divided using the LigaSure. At approximately the mid pelvis region, above the area of dense adhesions, the descending colon was divided using the GIA 75 stapler. The specimen was then removed and sent to pathology for further examination. A side to side ileocolic anastomosis was then performed using a GIA 75 stapler. The enterotomy was closed using a TA 60 stapler. The staple line was bolstered using 3-0 silk sutures. Surrounding adipose tissue was placed over the staple line and secured into place using 3-0 silk interrupted sutures. A widely patent anastomosis was seen. There was no evidence of leak. All operating personnel then changed her gown and gloves.  The abdominal cavity was copiously irrigated normal saline. Surgicel and Gelfoam were then placed in the splenic flexure and hepatic flexure areas to control oozing. The bowel and mesentery were noted to be easily bruise  at will. The fascia was then reapproximated using a looped oh Novafil running suture. The subcutaneous layer was irrigated normal saline.  Exparel was instilled in the surrounding wound. The skin was closed using staples. Betadine ointment and dry sterile dressings were applied.  All tape and needle counts were correct at the end of the procedure. The patient was extubated in the operating room and transferred to PACU in stable condition.  Complications:  None  EBL:  500 cc  Specimen:  Colon

## 2013-12-13 NOTE — Anesthesia Procedure Notes (Signed)
Procedure Name: Intubation Date/Time: 12/13/2013 7:42 AM Performed by: Andree Elk, Monta Maiorana A Pre-anesthesia Checklist: Patient identified, Patient being monitored, Timeout performed, Emergency Drugs available and Suction available Patient Re-evaluated:Patient Re-evaluated prior to inductionOxygen Delivery Method: Circle System Utilized Preoxygenation: Pre-oxygenation with 100% oxygen Intubation Type: IV induction, Rapid sequence and Cricoid Pressure applied Laryngoscope Size: 3 and Miller Grade View: Grade I Tube type: Oral Tube size: 7.0 mm Number of attempts: 1 Airway Equipment and Method: stylet Placement Confirmation: ETT inserted through vocal cords under direct vision,  positive ETCO2 and breath sounds checked- equal and bilateral Secured at: 21 cm Tube secured with: Tape Dental Injury: Teeth and Oropharynx as per pre-operative assessment

## 2013-12-14 ENCOUNTER — Encounter (HOSPITAL_COMMUNITY): Payer: Self-pay | Admitting: General Surgery

## 2013-12-14 LAB — BASIC METABOLIC PANEL
BUN: 25 mg/dL — ABNORMAL HIGH (ref 6–23)
CALCIUM: 7.9 mg/dL — AB (ref 8.4–10.5)
CO2: 29 mEq/L (ref 19–32)
CREATININE: 1.66 mg/dL — AB (ref 0.50–1.35)
Chloride: 101 mEq/L (ref 96–112)
GFR, EST AFRICAN AMERICAN: 45 mL/min — AB (ref >90)
GFR, EST NON AFRICAN AMERICAN: 39 mL/min — AB (ref >90)
GLUCOSE: 135 mg/dL — AB (ref 70–99)
Potassium: 4.2 mEq/L (ref 3.7–5.3)
Sodium: 138 mEq/L (ref 137–147)

## 2013-12-14 LAB — CBC
HEMATOCRIT: 29.9 % — AB (ref 39.0–52.0)
Hemoglobin: 10.1 g/dL — ABNORMAL LOW (ref 13.0–17.0)
MCH: 31.1 pg (ref 26.0–34.0)
MCHC: 33.8 g/dL (ref 30.0–36.0)
MCV: 92 fL (ref 78.0–100.0)
Platelets: 75 10*3/uL — ABNORMAL LOW (ref 150–400)
RBC: 3.25 MIL/uL — ABNORMAL LOW (ref 4.22–5.81)
RDW: 13.7 % (ref 11.5–15.5)
WBC: 6 10*3/uL (ref 4.0–10.5)

## 2013-12-14 LAB — MAGNESIUM: Magnesium: 1.6 mg/dL (ref 1.5–2.5)

## 2013-12-14 LAB — PHOSPHORUS: Phosphorus: 2.9 mg/dL (ref 2.3–4.6)

## 2013-12-14 MED ORDER — SODIUM CHLORIDE 0.9 % IV BOLUS (SEPSIS)
500.0000 mL | Freq: Once | INTRAVENOUS | Status: AC
  Administered 2013-12-14: 500 mL via INTRAVENOUS

## 2013-12-14 MED ORDER — KCL IN DEXTROSE-NACL 20-5-0.45 MEQ/L-%-% IV SOLN
INTRAVENOUS | Status: DC
  Administered 2013-12-14 – 2013-12-15 (×3): via INTRAVENOUS

## 2013-12-14 NOTE — Addendum Note (Signed)
Addendum created 12/14/13 0853 by Vista Deck, CRNA   Modules edited: Notes Section   Notes Section:  File: [id number]

## 2013-12-14 NOTE — Progress Notes (Signed)
1 Day Post-Op  Subjective: Mild incisional pain, though well controlled with pain medication. Patient tolerating clear liquid diet.  Objective: Vital signs in last 24 hours: Temp:  [97.3 F (36.3 C)-98.5 F (36.9 C)] 98.5 F (36.9 C) (03/05 0513) Pulse Rate:  [48-112] 93 (03/05 0513) Resp:  [7-23] 18 (03/05 0513) BP: (91-169)/(52-112) 145/67 mmHg (03/05 0513) SpO2:  [92 %-100 %] 96 % (03/05 0513) Weight:  [78 kg (171 lb 15.3 oz)] 78 kg (171 lb 15.3 oz) (03/04 1226) Last BM Date: 12/13/13  Intake/Output from previous day: 03/04 0701 - 03/05 0700 In: 2800 [I.V.:2800] Out: 1200 [Urine:700; Blood:500] Intake/Output this shift:    General appearance: alert, cooperative and no distress Resp: clear to auscultation bilaterally Cardio: Irregularly irregular. No S3 or S4 noted. GI: Soft. Dressing intact. Bowel sounds appreciated.  Lab Results:   Recent Labs  12/13/13 1030 12/14/13 0542  WBC  --  6.0  HGB 12.9* 10.1*  HCT 39.4 29.9*  PLT  --  75*   BMET  Recent Labs  12/14/13 0542  NA 138  K 4.2  CL 101  CO2 29  GLUCOSE 135*  BUN 25*  CREATININE 1.66*  CALCIUM 7.9*   PT/INR No results found for this basename: LABPROT, INR,  in the last 72 hours  Studies/Results: No results found.  Anti-infectives: Anti-infectives   Start     Dose/Rate Route Frequency Ordered Stop   12/13/13 0614  metroNIDAZOLE (FLAGYL) IVPB 500 mg     500 mg 100 mL/hr over 60 Minutes Intravenous On call to O.R. 12/13/13 4193 12/13/13 0743   12/13/13 7902  ciprofloxacin (CIPRO) IVPB 400 mg     400 mg 200 mL/hr over 60 Minutes Intravenous On call to O.R. 12/13/13 4097 12/13/13 0857      Assessment/Plan: s/p Procedure(s): TOTAL COLECTOMY Impression: Stable on postoperative day 1, status post total colectomy. Will get patient up in chair. Will delay starting Coumadin anticoagulation for 1 more day due to the extensiveness of the surgery.  LOS: 1 day    Ival Basquez A 12/14/2013

## 2013-12-14 NOTE — Progress Notes (Signed)
Patient has only had 286mL of urine output since 0600.  Notified Dr Arnoldo Morale, order for 533mL bolus given.  To monitor output over next 4 hours, may repeat bolus if less that 166mL of urine output.

## 2013-12-14 NOTE — Anesthesia Postprocedure Evaluation (Signed)
Anesthesia Post Note  Patient: Cory Ryan  Procedure(s) Performed: Procedure(s) (LRB): TOTAL COLECTOMY (N/A)  Anesthesia type: General  Patient location: 329  Post pain: Pain level controlled  Post assessment: Post-op Vital signs reviewed, Patient's Cardiovascular Status Stable, Respiratory Function Stable, Patent Airway, No signs of Nausea or vomiting and Pain level controlled  Last Vitals:  Filed Vitals:   12/14/13 0513  BP: 145/67  Pulse: 93  Temp: 36.9 C  Resp: 18    Post vital signs: Reviewed and stable  Level of consciousness: awake and alert   Complications: No apparent anesthesia complications

## 2013-12-15 LAB — CBC
HEMATOCRIT: 29 % — AB (ref 39.0–52.0)
Hemoglobin: 9.8 g/dL — ABNORMAL LOW (ref 13.0–17.0)
MCH: 31.4 pg (ref 26.0–34.0)
MCHC: 33.8 g/dL (ref 30.0–36.0)
MCV: 92.9 fL (ref 78.0–100.0)
Platelets: 77 10*3/uL — ABNORMAL LOW (ref 150–400)
RBC: 3.12 MIL/uL — ABNORMAL LOW (ref 4.22–5.81)
RDW: 13.8 % (ref 11.5–15.5)
WBC: 5.3 10*3/uL (ref 4.0–10.5)

## 2013-12-15 LAB — BASIC METABOLIC PANEL
BUN: 16 mg/dL (ref 6–23)
CHLORIDE: 101 meq/L (ref 96–112)
CO2: 30 mEq/L (ref 19–32)
Calcium: 7.8 mg/dL — ABNORMAL LOW (ref 8.4–10.5)
Creatinine, Ser: 1.61 mg/dL — ABNORMAL HIGH (ref 0.50–1.35)
GFR calc Af Amer: 47 mL/min — ABNORMAL LOW (ref >90)
GFR calc non Af Amer: 40 mL/min — ABNORMAL LOW (ref >90)
Glucose, Bld: 168 mg/dL — ABNORMAL HIGH (ref 70–99)
POTASSIUM: 4.3 meq/L (ref 3.7–5.3)
Sodium: 139 mEq/L (ref 137–147)

## 2013-12-15 LAB — MAGNESIUM: MAGNESIUM: 2 mg/dL (ref 1.5–2.5)

## 2013-12-15 LAB — PROTIME-INR
INR: 1.19 (ref 0.00–1.49)
Prothrombin Time: 14.8 seconds (ref 11.6–15.2)

## 2013-12-15 LAB — PHOSPHORUS: Phosphorus: 1.8 mg/dL — ABNORMAL LOW (ref 2.3–4.6)

## 2013-12-15 MED ORDER — WARFARIN - PHARMACIST DOSING INPATIENT: Status: DC

## 2013-12-15 MED ORDER — SODIUM GLYCEROPHOSPHATE 1 MMOLE/ML IV SOLN
20.0000 mmol | Freq: Once | INTRAVENOUS | Status: AC
  Administered 2013-12-15: 20 mmol via INTRAVENOUS
  Filled 2013-12-15: qty 20

## 2013-12-15 MED ORDER — WARFARIN SODIUM 7.5 MG PO TABS
7.5000 mg | ORAL_TABLET | Freq: Once | ORAL | Status: AC
  Administered 2013-12-15: 7.5 mg via ORAL
  Filled 2013-12-15: qty 1

## 2013-12-15 MED ORDER — PANTOPRAZOLE SODIUM 40 MG PO TBEC
40.0000 mg | DELAYED_RELEASE_TABLET | Freq: Every day | ORAL | Status: DC
  Administered 2013-12-15 – 2013-12-18 (×4): 40 mg via ORAL
  Filled 2013-12-15 (×4): qty 1

## 2013-12-15 NOTE — Progress Notes (Signed)
2 Days Post-Op  Subjective: Have bowel movements yesterday evening. Still having some belching from upset stomach. Tolerating clear liquid diet.  Objective: Vital signs in last 24 hours: Temp:  [97.5 F (36.4 C)-98.5 F (36.9 C)] 98.5 F (36.9 C) (03/06 0113) Pulse Rate:  [69-129] 129 (03/06 0821) Resp:  [20] 20 (03/06 0113) BP: (109-148)/(68-87) 148/87 mmHg (03/06 0113) SpO2:  [93 %-100 %] 97 % (03/06 0821) Last BM Date: 12/15/13  Intake/Output from previous day: 03/05 0701 - 03/06 0700 In: 2385 [P.O.:240; I.V.:1645; IV Piggyback:500] Out: 1600 [Urine:1600] Intake/Output this shift:    General appearance: alert, cooperative and no distress Resp: clear to auscultation bilaterally Cardio: Irregularly irregular rhythm. GI: Soft. Incision healing well. Bowel sounds active.  Lab Results:   Recent Labs  12/14/13 0542 12/15/13 0543  WBC 6.0 5.3  HGB 10.1* 9.8*  HCT 29.9* 29.0*  PLT 75* 77*   BMET  Recent Labs  12/14/13 0542 12/15/13 0543  NA 138 139  K 4.2 4.3  CL 101 101  CO2 29 30  GLUCOSE 135* 168*  BUN 25* 16  CREATININE 1.66* 1.61*  CALCIUM 7.9* 7.8*   PT/INR  Recent Labs  12/15/13 0543  LABPROT 14.8  INR 1.19    Studies/Results: No results found.  Anti-infectives: Anti-infectives   Start     Dose/Rate Route Frequency Ordered Stop   12/13/13 0614  metroNIDAZOLE (FLAGYL) IVPB 500 mg     500 mg 100 mL/hr over 60 Minutes Intravenous On call to O.R. 12/13/13 5427 12/13/13 0743   12/13/13 0623  ciprofloxacin (CIPRO) IVPB 400 mg     400 mg 200 mL/hr over 60 Minutes Intravenous On call to O.R. 12/13/13 7628 12/13/13 0857      Assessment/Plan: s/p Procedure(s): TOTAL COLECTOMY Impression: Stable on postoperative day 2. Hemoglobin stable. Hypophosphatemia noted and this will be addressed. We'll restart anticoagulation. Advance to full liquid diet. Will DC entereg.  LOS: 2 days    Cory Ryan A 12/15/2013

## 2013-12-15 NOTE — Anesthesia Postprocedure Evaluation (Signed)
  Anesthesia Post-op Note  Patient: Cory Ryan  Procedure(s) Performed: Procedure(s): TOTAL COLECTOMY (N/A)  Patient Location: ROOM 329  Anesthesia Type:General  Level of Consciousness: awake, alert , oriented and patient cooperative  Airway and Oxygen Therapy: Patient Spontanous Breathing  Post-op Pain: mild  Post-op Assessment: Post-op Vital signs reviewed, Patient's Cardiovascular Status Stable, Respiratory Function Stable, Patent Airway, No signs of Nausea or vomiting and Pain level controlled  Post-op Vital Signs: Reviewed and stable  Complications: No apparent anesthesia complications;  Foley dc'd today

## 2013-12-15 NOTE — Progress Notes (Signed)
ANTICOAGULATION CONSULT NOTE - Initial Consult  Pharmacy Consult for Coumadin Indication: atrial fibrillation  No Known Allergies  Patient Measurements: Height: 5\' 10"  (177.8 cm) Weight: 171 lb 15.3 oz (78 kg) IBW/kg (Calculated) : 73  Vital Signs: Temp: 98.5 F (36.9 C) (03/06 0113) Temp src: Oral (03/06 0113) BP: 148/87 mmHg (03/06 0113) Pulse Rate: 129 (03/06 0821)  Labs:  Recent Labs  12/13/13 1030 12/14/13 0542 12/15/13 0543  HGB 12.9* 10.1* 9.8*  HCT 39.4 29.9* 29.0*  PLT  --  75* 77*  LABPROT  --   --  14.8  INR  --   --  1.19  CREATININE  --  1.66* 1.61*    Estimated Creatinine Clearance: 41.6 ml/min (by C-G formula based on Cr of 1.61).   Medical History: Past Medical History  Diagnosis Date  . Atrial fibrillation   . Chronic systolic heart failure     LVEF 40-45%  . Emphysema   . Coronary atherosclerosis of native coronary artery     Multivessel status post CABG  . Gastric erosions 12/2005    Per EGD - Dr. [name]  . Colon polyps 12/2005    Per colonoscopy  . Ischemic cardiomyopathy   . Aortic stenosis     Moderate  . Mitral regurgitation     Moderate  . Pulmonary nodules   . Hypothyroidism   . Psoriasis   . Myocardial infarction     Remote  . CKD (chronic kidney disease) stage 3, GFR 30-59 ml/min   . Anemia   . Colon cancer   . Atrial fibrillation   . CHF (congestive heart failure)   . Anxiety   . Shortness of breath     exertion  . GERD (gastroesophageal reflux disease)     Medications:  Prescriptions prior to admission  Medication Sig Dispense Refill  . ALPRAZolam (XANAX) 1 MG tablet Take 1 mg by mouth as needed.      . carvedilol (COREG) 6.25 MG tablet Take 1 tablet (6.25 mg total) by mouth 2 (two) times daily with a meal.  60 tablet  6  . furosemide (LASIX) 80 MG tablet Take 0.5 tablets (40 mg total) by mouth 2 (two) times daily. Taking 11/2 tablets bid  60 tablet  6  . HYDROcodone-acetaminophen (NORCO) 5-325 MG per tablet Take  1-2 tablets by mouth every 4 (four) hours as needed for pain.  45 tablet  0  . Ipratropium-Albuterol (COMBIVENT RESPIMAT) 20-100 MCG/ACT AERS respimat Inhale 1 puff into the lungs as needed.  1 Inhaler  1  . loperamide (IMODIUM) 2 MG capsule Take 1 capsule (2 mg total) by mouth as needed for diarrhea or loose stools.  20 capsule  2  . Magnesium Hydroxide (MILK OF MAGNESIA PO) Take 30 mLs by mouth as needed.      . potassium chloride SA (K-DUR,KLOR-CON) 20 MEQ tablet Take 10 mEq by mouth 2 (two) times daily. Taking 1/2 tablet bid      . warfarin (COUMADIN) 5 MG tablet TAKE (1) TABLET BY MOUTH AT BEDTIME OR AS DIRECTED BY COUMADIN CLINIC.  30 tablet  1   Scheduled:  . carvedilol  6.25 mg Oral BID WC  . furosemide  40 mg Oral BID  . pantoprazole  40 mg Oral Q1200  . sodium glycerophosphate 0.9% NaCl IVPB  20 mmol Intravenous Once    Assessment: 73 yo M on chronic warfarin for hx Afib.  Home dose listed above.  Warfarin for held for ~ 1 week  PTA for surgical procedure.  He is now POD#2 colectomy and is restarting warfarin.  No active bleeding noted.   He also has low phosphorus and pharmacy has been asked to replace.  Na and K are wnl.    Goal of Therapy:  INR 2-3   Plan:  Coumadin 7.5mg  po x1 today Daily INR Glycerphos 30mMol IV x1 today F/U Bmet, Phosphorus in am  Azeez Dunker, Lavonia Drafts 12/15/2013,10:37 AM

## 2013-12-15 NOTE — Addendum Note (Signed)
Addendum created 12/15/13 1259 by Mickel Baas, CRNA   Modules edited: Notes Section   Notes Section:  File: [id number]

## 2013-12-16 LAB — CLOSTRIDIUM DIFFICILE BY PCR: Toxigenic C. Difficile by PCR: NEGATIVE

## 2013-12-16 LAB — CBC
HCT: 33.9 % — ABNORMAL LOW (ref 39.0–52.0)
Hemoglobin: 11.3 g/dL — ABNORMAL LOW (ref 13.0–17.0)
MCH: 31 pg (ref 26.0–34.0)
MCHC: 33.3 g/dL (ref 30.0–36.0)
MCV: 92.9 fL (ref 78.0–100.0)
PLATELETS: 109 10*3/uL — AB (ref 150–400)
RBC: 3.65 MIL/uL — ABNORMAL LOW (ref 4.22–5.81)
RDW: 13.9 % (ref 11.5–15.5)
WBC: 6 10*3/uL (ref 4.0–10.5)

## 2013-12-16 LAB — BASIC METABOLIC PANEL
BUN: 12 mg/dL (ref 6–23)
CALCIUM: 8 mg/dL — AB (ref 8.4–10.5)
CO2: 33 meq/L — AB (ref 19–32)
CREATININE: 1.32 mg/dL (ref 0.50–1.35)
Chloride: 96 mEq/L (ref 96–112)
GFR calc Af Amer: 60 mL/min — ABNORMAL LOW (ref >90)
GFR calc non Af Amer: 51 mL/min — ABNORMAL LOW (ref >90)
Glucose, Bld: 171 mg/dL — ABNORMAL HIGH (ref 70–99)
Potassium: 3.3 mEq/L — ABNORMAL LOW (ref 3.7–5.3)
SODIUM: 139 meq/L (ref 137–147)

## 2013-12-16 LAB — PROTIME-INR
INR: 1.13 (ref 0.00–1.49)
Prothrombin Time: 14.3 seconds (ref 11.6–15.2)

## 2013-12-16 LAB — PHOSPHORUS: PHOSPHORUS: 1.9 mg/dL — AB (ref 2.3–4.6)

## 2013-12-16 MED ORDER — POTASSIUM PHOSPHATE DIBASIC 3 MMOLE/ML IV SOLN
30.0000 mmol | Freq: Once | INTRAVENOUS | Status: AC
  Administered 2013-12-16: 30 mmol via INTRAVENOUS
  Filled 2013-12-16: qty 10

## 2013-12-16 MED ORDER — WARFARIN SODIUM 7.5 MG PO TABS
7.5000 mg | ORAL_TABLET | Freq: Once | ORAL | Status: AC
  Administered 2013-12-16: 7.5 mg via ORAL
  Filled 2013-12-16: qty 1

## 2013-12-16 NOTE — Progress Notes (Signed)
[facility] for Coumadin Indication: atrial fibrillation  No Known Allergies  Patient Measurements: Height: 5\' 10"  (177.8 cm) Weight: 176 lb 9.4 oz (80.1 kg) IBW/kg (Calculated) : 73  Vital Signs: Temp: 98.3 F (36.8 C) (03/07 0425) Temp src: Oral (03/07 0425) BP: 136/63 mmHg (03/07 0425) Pulse Rate: 112 (03/07 0425)  Labs:  Recent Labs  12/14/13 0542 12/15/13 0543 12/16/13 0657  HGB 10.1* 9.8* 11.3*  HCT 29.9* 29.0* 33.9*  PLT 75* 77* 109*  LABPROT  --  14.8 14.3  INR  --  1.19 1.13  CREATININE 1.66* 1.61* 1.32    Estimated Creatinine Clearance: 50.7 ml/min (by C-G formula based on Cr of 1.32).   Medical History: Past Medical History  Diagnosis Date  . Atrial fibrillation   . Chronic systolic heart failure     LVEF 40-45%  . Emphysema   . Coronary atherosclerosis of native coronary artery     Multivessel status post CABG  . Gastric erosions 12/2005    Per EGD - Dr. [name]  . Colon polyps 12/2005    Per colonoscopy  . Ischemic cardiomyopathy   . Aortic stenosis     Moderate  . Mitral regurgitation     Moderate  . Pulmonary nodules   . Hypothyroidism   . Psoriasis   . Myocardial infarction     Remote  . CKD (chronic kidney disease) stage 3, GFR 30-59 ml/min   . Anemia   . Colon cancer   . Atrial fibrillation   . CHF (congestive heart failure)   . Anxiety   . Shortness of breath     exertion  . GERD (gastroesophageal reflux disease)     Medications:  Prescriptions prior to admission  Medication Sig Dispense Refill  . ALPRAZolam (XANAX) 1 MG tablet Take 1 mg by mouth as needed.      . carvedilol (COREG) 6.25 MG tablet Take 1 tablet (6.25 mg total) by mouth 2 (two) times daily with a meal.  60 tablet  6  . furosemide (LASIX) 80 MG tablet Take 0.5 tablets (40 mg total) by mouth 2 (two) times daily. Taking 11/2 tablets bid  60 tablet  6  . HYDROcodone-acetaminophen (NORCO) 5-325 MG per tablet Take 1-2 tablets by  mouth every 4 (four) hours as needed for pain.  45 tablet  0  . Ipratropium-Albuterol (COMBIVENT RESPIMAT) 20-100 MCG/ACT AERS respimat Inhale 1 puff into the lungs as needed.  1 Inhaler  1  . loperamide (IMODIUM) 2 MG capsule Take 1 capsule (2 mg total) by mouth as needed for diarrhea or loose stools.  20 capsule  2  . Magnesium Hydroxide (MILK OF MAGNESIA PO) Take 30 mLs by mouth as needed.      . potassium chloride SA (K-DUR,KLOR-CON) 20 MEQ tablet Take 10 mEq by mouth 2 (two) times daily. Taking 1/2 tablet bid      . warfarin (COUMADIN) 5 MG tablet Take 2.5-5 mg by mouth daily. Take 1 tablet (5mg ) daily except 1/2 tablet (2.5mg ) on Wednesday       Scheduled:  . carvedilol  6.25 mg Oral BID WC  . furosemide  40 mg Oral BID  . pantoprazole  40 mg Oral Q1200  . Warfarin - Pharmacist Dosing Inpatient   Does not apply Q24H    Assessment: 73 yo M on chronic warfarin for hx Afib.  Home dose listed above.  Warfarin for held for ~ 1 week PTA for surgical procedure.  He is  now POD#3 colectomy and  restarted warfarin yesterday. INR continues to trend down. No active bleeding noted.   He also has low phosphorus and pharmacy has been asked to replace. Potassium is also low today.     Goal of Therapy:  INR 2-3   Plan:  Coumadin 7.5mg  po x1 today Daily INR Kphos 77mMol IV x1 today F/U Bmet, Phosphorus in am  Biagio Borg 12/16/2013,9:33 AM

## 2013-12-16 NOTE — Progress Notes (Signed)
3 Days Post-Op  Subjective: Having multiple episodes of loose stools. Still has some epigastric dyspepsia. No emesis noted.  Objective: Vital signs in last 24 hours: Temp:  [97.9 F (36.6 C)-98.3 F (36.8 C)] 98.3 F (36.8 C) (03/07 0425) Pulse Rate:  [83-129] 112 (03/07 0425) Resp:  [20] 20 (03/07 0425) BP: (136-159)/(63-84) 136/63 mmHg (03/07 0425) SpO2:  [95 %-98 %] 98 % (03/07 0425) Weight:  [80.1 kg (176 lb 9.4 oz)] 80.1 kg (176 lb 9.4 oz) (03/07 0425) Last BM Date: 12/15/13  Intake/Output from previous day: 03/06 0701 - 03/07 0700 In: 1016.3 [P.O.:240; I.V.:776.3] Out: 250 [Urine:250] Intake/Output this shift:    General appearance: alert, cooperative and no distress Resp: clear to auscultation bilaterally Cardio: Irregularly irregular rhythm. GI: Soft. Active bowel sounds appreciated. Incision healing well.  Lab Results:   Recent Labs  12/15/13 0543 12/16/13 0657  WBC 5.3 6.0  HGB 9.8* 11.3*  HCT 29.0* 33.9*  PLT 77* PENDING   BMET  Recent Labs  12/15/13 0543 12/16/13 0657  NA 139 139  K 4.3 PENDING  CL 101 96  CO2 30 33*  GLUCOSE 168* 171*  BUN 16 12  CREATININE 1.61* 1.32  CALCIUM 7.8* 8.0*   PT/INR  Recent Labs  12/15/13 0543 12/16/13 0657  LABPROT 14.8 14.3  INR 1.19 1.13    Studies/Results: No results found.  Anti-infectives: Anti-infectives   Start     Dose/Rate Route Frequency Ordered Stop   12/13/13 0614  metroNIDAZOLE (FLAGYL) IVPB 500 mg     500 mg 100 mL/hr over 60 Minutes Intravenous On call to O.R. 12/13/13 3532 12/13/13 0743   12/13/13 9924  ciprofloxacin (CIPRO) IVPB 400 mg     400 mg 200 mL/hr over 60 Minutes Intravenous On call to O.R. 12/13/13 2683 12/13/13 0857      Assessment/Plan: s/p Procedure(s): TOTAL COLECTOMY Impression: Bowel function has returned. Will get 1 C. difficile toxin study. He will be having diarrhea do to his total colectomy. We'll advance to regular diet. Patient has been restarted on  Coumadin as per pharmacy. Hypophosphatemia still present. Final pathology did reveal a T3, N0, M0 adenocarcinoma. Margins were clear.  LOS: 3 days    Anndrea Mihelich A 12/16/2013

## 2013-12-17 LAB — BASIC METABOLIC PANEL
BUN: 13 mg/dL (ref 6–23)
CALCIUM: 8.1 mg/dL — AB (ref 8.4–10.5)
CHLORIDE: 97 meq/L (ref 96–112)
CO2: 32 mEq/L (ref 19–32)
CREATININE: 1.57 mg/dL — AB (ref 0.50–1.35)
GFR calc Af Amer: 48 mL/min — ABNORMAL LOW (ref >90)
GFR calc non Af Amer: 42 mL/min — ABNORMAL LOW (ref >90)
Glucose, Bld: 146 mg/dL — ABNORMAL HIGH (ref 70–99)
Potassium: 3.4 mEq/L — ABNORMAL LOW (ref 3.7–5.3)
Sodium: 139 mEq/L (ref 137–147)

## 2013-12-17 LAB — PHOSPHORUS: Phosphorus: 1.8 mg/dL — ABNORMAL LOW (ref 2.3–4.6)

## 2013-12-17 LAB — PROTIME-INR
INR: 1.3 (ref 0.00–1.49)
PROTHROMBIN TIME: 15.9 s — AB (ref 11.6–15.2)

## 2013-12-17 MED ORDER — WARFARIN SODIUM 7.5 MG PO TABS
7.5000 mg | ORAL_TABLET | Freq: Once | ORAL | Status: AC
  Administered 2013-12-17: 7.5 mg via ORAL
  Filled 2013-12-17: qty 1

## 2013-12-17 MED ORDER — POTASSIUM PHOSPHATE DIBASIC 3 MMOLE/ML IV SOLN
30.0000 mmol | Freq: Once | INTRAVENOUS | Status: AC
  Administered 2013-12-17: 30 mmol via INTRAVENOUS
  Filled 2013-12-17: qty 10

## 2013-12-17 NOTE — Progress Notes (Signed)
ANTICOAGULATION CONSULT NOTE  Pharmacy Consult for Coumadin Indication: atrial fibrillation  No Known Allergies  Patient Measurements: Height: 5\' 10"  (177.8 cm) Weight: 176 lb 9.4 oz (80.1 kg) IBW/kg (Calculated) : 73  Vital Signs: Temp: 97.8 F (36.6 C) (03/08 0624) Temp src: Oral (03/08 0624) BP: 139/76 mmHg (03/08 0624) Pulse Rate: 105 (03/08 0624)  Labs:  Recent Labs  12/15/13 0543 12/16/13 0657 12/17/13 0643  HGB 9.8* 11.3*  --   HCT 29.0* 33.9*  --   PLT 77* 109*  --   LABPROT 14.8 14.3 15.9*  INR 1.19 1.13 1.30  CREATININE 1.61* 1.32 1.57*    Estimated Creatinine Clearance: 42.6 ml/min (by C-G formula based on Cr of 1.57).   Medical History: Past Medical History  Diagnosis Date  . Atrial fibrillation   . Chronic systolic heart failure     LVEF 40-45%  . Emphysema   . Coronary atherosclerosis of native coronary artery     Multivessel status post CABG  . Gastric erosions 12/2005    Per EGD - Dr. [name]  . Colon polyps 12/2005    Per colonoscopy  . Ischemic cardiomyopathy   . Aortic stenosis     Moderate  . Mitral regurgitation     Moderate  . Pulmonary nodules   . Hypothyroidism   . Psoriasis   . Myocardial infarction     Remote  . CKD (chronic kidney disease) stage 3, GFR 30-59 ml/min   . Anemia   . Colon cancer   . Atrial fibrillation   . CHF (congestive heart failure)   . Anxiety   . Shortness of breath     exertion  . GERD (gastroesophageal reflux disease)     Medications:  Prescriptions prior to admission  Medication Sig Dispense Refill  . ALPRAZolam (XANAX) 1 MG tablet Take 1 mg by mouth as needed.      . carvedilol (COREG) 6.25 MG tablet Take 1 tablet (6.25 mg total) by mouth 2 (two) times daily with a meal.  60 tablet  6  . furosemide (LASIX) 80 MG tablet Take 0.5 tablets (40 mg total) by mouth 2 (two) times daily. Taking 11/2 tablets bid  60 tablet  6  . HYDROcodone-acetaminophen (NORCO) 5-325 MG per tablet Take 1-2 tablets by  mouth every 4 (four) hours as needed for pain.  45 tablet  0  . Ipratropium-Albuterol (COMBIVENT RESPIMAT) 20-100 MCG/ACT AERS respimat Inhale 1 puff into the lungs as needed.  1 Inhaler  1  . loperamide (IMODIUM) 2 MG capsule Take 1 capsule (2 mg total) by mouth as needed for diarrhea or loose stools.  20 capsule  2  . Magnesium Hydroxide (MILK OF MAGNESIA PO) Take 30 mLs by mouth as needed.      . potassium chloride SA (K-DUR,KLOR-CON) 20 MEQ tablet Take 10 mEq by mouth 2 (two) times daily. Taking 1/2 tablet bid      . warfarin (COUMADIN) 5 MG tablet Take 2.5-5 mg by mouth daily. Take 1 tablet (5mg ) daily except 1/2 tablet (2.5mg ) on Wednesday       Scheduled:  . carvedilol  6.25 mg Oral BID WC  . furosemide  40 mg Oral BID  . pantoprazole  40 mg Oral Q1200  . Warfarin - Pharmacist Dosing Inpatient   Does not apply Q24H    Assessment: 73 yo M on chronic warfarin for hx Afib.  Home dose listed above.  Warfarin for held for ~ 1 week PTA for surgical procedure.  He is now POD#4 colectomy and  restarted warfarin on POD#2. INR trending up. No active bleeding noted.   He is tolerating regular diet now, but potassium & phosphorus remain low.   Goal of Therapy:  INR 2-3 Potassium >4 Phosphorus 2.3-4.6   Plan:  Coumadin 7.5mg  po x1 today Daily INR Kphos 53mMol IV x1 today F/U Bmet, Phosphorus in am  Biagio Borg 12/17/2013,9:37 AM

## 2013-12-17 NOTE — Progress Notes (Signed)
4 Days Post-Op  Subjective: Feels much better. Less dyspepsia noted. Bowel movements have decreased in frequency. Tolerating regular diet well.  Objective: Vital signs in last 24 hours: Temp:  [97.8 F (36.6 C)-98.3 F (36.8 C)] 97.8 F (36.6 C) (03/08 0624) Pulse Rate:  [89-105] 105 (03/08 0624) Resp:  [20] 20 (03/08 0624) BP: (114-139)/(64-76) 139/76 mmHg (03/08 0624) SpO2:  [94 %-99 %] 99 % (03/08 0624) Last BM Date: 12/16/13  Intake/Output from previous day: 03/07 0701 - 03/08 0700 In: 12 [P.O.:970] Out: -  Intake/Output this shift:    General appearance: alert, cooperative and no distress Resp: clear to auscultation bilaterally Cardio: irregularly irregular rhythm GI: soft, non-tender; bowel sounds normal; no masses,  no organomegaly and Incision healing well.  Lab Results:   Recent Labs  12/15/13 0543 12/16/13 0657  WBC 5.3 6.0  HGB 9.8* 11.3*  HCT 29.0* 33.9*  PLT 77* 109*   BMET  Recent Labs  12/16/13 0657 12/17/13 0643  NA 139 139  K 3.3* 3.4*  CL 96 97  CO2 33* 32  GLUCOSE 171* 146*  BUN 12 13  CREATININE 1.32 1.57*  CALCIUM 8.0* 8.1*   PT/INR  Recent Labs  12/16/13 0657 12/17/13 0643  LABPROT 14.3 15.9*  INR 1.13 1.30    Studies/Results: No results found.  Anti-infectives: Anti-infectives   Start     Dose/Rate Route Frequency Ordered Stop   12/13/13 0614  metroNIDAZOLE (FLAGYL) IVPB 500 mg     500 mg 100 mL/hr over 60 Minutes Intravenous On call to O.R. 12/13/13 6270 12/13/13 0743   12/13/13 3500  ciprofloxacin (CIPRO) IVPB 400 mg     400 mg 200 mL/hr over 60 Minutes Intravenous On call to O.R. 12/13/13 9381 12/13/13 0857      Assessment/Plan: s/p Procedure(s): TOTAL COLECTOMY Impression: Stable on postoperative day 4. Tolerating regular diet well. C. difficile toxin negative. Diarrhea prior secondary to total colectomy. Patient has been restarted on Coumadin. Hopefully will discharge in next 24-48 hours pending INR  results.  LOS: 4 days    Dakota Vanwart A 12/17/2013

## 2013-12-18 DIAGNOSIS — E43 Unspecified severe protein-calorie malnutrition: Secondary | ICD-10-CM | POA: Insufficient documentation

## 2013-12-18 LAB — BASIC METABOLIC PANEL
BUN: 16 mg/dL (ref 6–23)
CALCIUM: 8.1 mg/dL — AB (ref 8.4–10.5)
CO2: 31 mEq/L (ref 19–32)
CREATININE: 1.59 mg/dL — AB (ref 0.50–1.35)
Chloride: 97 mEq/L (ref 96–112)
GFR calc Af Amer: 48 mL/min — ABNORMAL LOW (ref >90)
GFR calc non Af Amer: 41 mL/min — ABNORMAL LOW (ref >90)
GLUCOSE: 134 mg/dL — AB (ref 70–99)
Potassium: 4 mEq/L (ref 3.7–5.3)
SODIUM: 140 meq/L (ref 137–147)

## 2013-12-18 LAB — PROTIME-INR
INR: 1.63 — ABNORMAL HIGH (ref 0.00–1.49)
Prothrombin Time: 18.9 seconds — ABNORMAL HIGH (ref 11.6–15.2)

## 2013-12-18 LAB — PHOSPHORUS: Phosphorus: 2.2 mg/dL — ABNORMAL LOW (ref 2.3–4.6)

## 2013-12-18 MED ORDER — HYDROCODONE-ACETAMINOPHEN 5-325 MG PO TABS: 1.0000 | ORAL_TABLET | ORAL | Status: AC | PRN

## 2013-12-18 MED ORDER — ENSURE COMPLETE PO LIQD
237.0000 mL | Freq: Two times a day (BID) | ORAL | Status: DC
  Administered 2013-12-18: 237 mL via ORAL

## 2013-12-18 NOTE — Discharge Instructions (Signed)
Warfarin: What You Need to Know Warfarin is an anticoagulant. Anticoagulants help prevent the formation of blood clots. They also help stop the growth of blood clots. Warfarin is sometimes referred to as a "blood thinner."  Normally, when body tissues are cut or damaged, the blood clots in order to prevent blood loss. Sometimes clots form inside your blood vessels and obstruct the flow of blood through your circulatory system (thrombosis). These clots may travel through your bloodstream and become lodged in smaller blood vessels in your brain, which can cause a stroke, or your lungs (pulmonary embolism). WHO SHOULD USE WARFARIN? Warfarin is prescribed for people at risk of developing harmful blood clots:  People with surgically implanted mechanical heart valves, irregular heart rhythms called atrial fibrillation, and certain clotting disorders.  People who have developed harmful blood clotting in the past, including those who have had a stroke or a pulmonary embolism, or thrombosis in their legs (deep vein thrombosis [DVT]).  People with an existing blood clot such as a pulmonary embolism. WARFARIN DOSING Warfarin tablets come in different strengths. Each tablet strength is a different color, with the amount of warfarin (in milligrams) clearly printed on the tablet. If the color of your tablet is different than usual when you receive a new prescription, report it immediately to your pharmacist or health care provider. WARFARIN MONITORING The goal of warfarin therapy is to lessen the clotting tendency of blood but not to prevent clotting completely. Your health care provider will monitor the anticoagulation effect of warfarin closely and adjust your dose as needed. For your safety, blood tests called prothrombin time (PT) or international normalized ratio (INR) are used to measure the effects of warfarin. Both of these tests can be done with a finger stick or a blood draw. The longer it takes the blood  to clot, the higher the PT or INR. Your health care provider will inform you of your "target" PT or INR range. If, at any time, your PT or INR is above the target range, there is a risk of bleeding. If your PT or INR is below the target range, there is a risk of clotting. Whether you are started on warfarin while you are in the hospital, or in your health care provider's office, you will need to have your PT or INR checked within one week of starting the medicine. Initially, some people are asked to have their PT or INR checked as much as twice a week. Once you are on a stable maintenance dose, the PT or INR is checked less often, usually once every 2 to 4 weeks. The warfarin dose may be adjusted if the PT or INR is not within the target range. It is important to keep all laboratory and health care provider follow-up appointments.  WHAT ARE THE SIDE EFFECTS OF WARFARIN?  Too much warfarin can cause bleeding (hemorrhage) from any part of the body. This may include bleeding from the gums, blood in the urine, bloody or dark stools, a nosebleed that is not easily stopped, coughing up blood, or vomiting blood.  Too little warfarin can increase the risk of blood clots.  Too little or too much warfarin can also increase the risk of a stroke.  Warfarin use may cause a skin rash or irritation, an unusual fever, continual nausea or stomach upset, or severe pain in your joints or back. SPECIAL PRECAUTIONS WHILE TAKING WARFARIN Warfarin should be taken exactly as directed:  Take your medicine at the same time every day.  If you forget to take your dose, you can take it if it is within 6 hours of when it was due.  Do not change the dose of warfarin on your own to make up for missed or extra doses.  If you miss more than 2 doses in a row, you should contact your health care provider for advice. Avoid situations that cause bleeding. You may have a tendency to bleed more easily than usual while taking warfarin.  The following actions can limit bleeding:  Using a softer toothbrush.  Flossing with waxed floss rather than unwaxed floss.  Shaving with an Copy rather than a blade.  Limiting the use of sharp objects.  Avoiding potentially harmful activities such as contact sports. Warfarin and Pregnancy or Breastfeeding  Warfarin is not advised during the first trimester of pregnancy due to an increased risk of birth defects. In certain situations, a woman may take warfarin after her first trimester of pregnancy. A woman who becomes pregnant or plans to become pregnant while taking warfarin should notify her health care provider immediately.  Although warfarin does not pass into breast milk, a woman who wishes to breastfeed while taking warfarin should also consult with her health care provider. Alcohol, Smoking, and Illicit Drug Use  Alcohol affects how warfarin works in the body. It is best to avoid alcoholic drinks or consume very small amounts while taking warfarin. In general, alcohol intake should be limited to 1 oz (30 mL) of liquor, 6 oz (180 mL) of wine, or 12 oz (360 mL) of beer each day. Notify your health care provider if you change your alcohol intake.  Smoking affects how warfarin works. It is best to avoid smoking while taking warfarin. Notify your health care provider if you change your smoking habits.  It is best to avoid all illicit drugs while taking warfarin since there are few studies that show how warfarin interacts with these drugs. Other Medicines and Dietary Supplements Many prescription and over-the-counter medicines can interfere with warfarin. Be sure all of your health care providers know you are taking warfarin. Notify your health care provider who prescribed warfarin for you before starting or stopping any new medicines, including over-the-counter vitamins, dietary supplements, and pain medicines. Your warfarin dose may need to be adjusted. Some common  over-the-counter medicines that may increase the risk of bleeding while taking warfarin include:   Acetaminophen.  Aspirin.  Nonsteroidal anti-inflammatory medicines such as ibuprofen or naproxen.  Vitamin E. Dietary Considerations  Foods that have moderate or high amounts of vitamin K can interfere with warfarin. Avoid major changes in your diet or notify your health care provider before changing your diet. Eat a consistent amount of foods that have moderate or high amounts of vitamin K.Eating less foods containing vitamin K can increase the risk of bleeding. Eating more foods containing vitamin K can increase the risk of blood clots. Additional questions about dietary considerations can be discussed with a dietitian. The serving size for foods containing moderate or high amounts of vitamin K are  cup cooked (120 mL or noted gram weight) or 1 cup raw (240 mL or noted gram weight), unless otherwise noted. These foods include: Proteins  Beef liver, 3.5 oz (100 g).  Pork liver, 3.5 oz (100 g). Legumes  Soybean oil.  Soybeans.  Garbanzo beans.  Green peas.  Black-eyed peas. Leafy green vegetables  Kale.  Spinach.  Nettle greens.  Swiss chard.  Watercress.  Endive.  Parsley, 1 tbsp (4 g).  Turnip greens.  Collard greens.  Seaweed, limit 2 sheets.  Beet greens.  Dandelion greens.  Mustard greens.  Green Lead and Romaine lettuce. Cruciferous vegetables  Broccoli.  Cabbage (green or [place]).  Brussels sprouts.  Cauliflower.  Asparagus. Miscellaneous  Onions, green onions, or spring onions.  Green tea made with  oz (14 g) or more of dried tea.  Herbal teas containing coumarin.  Spinach noodles.  Okra.  Prunes.  Angie Fava. CALL YOUR CLINIC OR HEALTH CARE PROVIDER IF YOU:  Plan to have any surgery or procedure.  Feel sick, especially if you have diarrhea or vomiting.  Experience or anticipate any major changes in your diet.  Start or  stop a prescription or over-the-counter medicine.  Become, plan to become, or think you may be pregnant.  Are having heavier than usual menstrual periods.  Have had a fall, accident, or any symptoms of bleeding or unusual bruising.  An unusual fever. CALL 911 IN THE [place] OR GO TO THE EMERGENCY DEPARTMENT IF YOU:   Think you may be having an allergic reaction to warfarin. The signs of an allergic reaction could include itching, rash, hives, swelling, chest tightness, or trouble breathing.  See signs of blood in your urine. The signs could include reddish, pinkish, or tea-colored urine.  See signs of blood in your stools. The signs could include bright red or black stools.  Vomit or cough up blood. In these instances, the blood could have either a bright red or a "coffee-grounds" appearance.  Have bleeding that will not stop after applying pressure for 30 minutes such as cuts, nosebleeds, other injuries.  Have severe pain in your joints or back.  Have a new and severe headache.  Have sudden weakness or numbness of your face, arm, or leg, especially on one side of your body.  Have sudden confusion or trouble understanding.  Have sudden trouble seeing in one or both eyes.  Have sudden trouble walking, dizziness, loss of balance, or coordination.  Have aphasia. Document Released: 09/28/2005 Document Revised: 06/22/2012 Document Reviewed: 03/24/2013 [facility] Patient Information 2014 [place]. Open Colectomy, Care After Refer to this sheet in the next few weeks. These instructions provide you with information on caring for yourself after your procedure. Your health care provider may also give you more specific instructions. Your treatment has been planned according to current medical practices, but problems sometimes occur. Call your health care provider if you have any problems or questions after your procedure. WHAT TO EXPECT AFTER THE PROCEDURE After your procedure, it is  typical to have the following:  Pain in your abdomen, especially along your incision. You will be given medicines to control the pain.  Tiredness. This is a normal part of the recovery process. Your energy level will return to normal over the next several weeks.  Constipation. You may be given a stool softener to prevent this. HOME CARE INSTRUCTIONS  Only take over-the-counter or prescription medicines as directed by your health care provider.  Ask your health care provider whether you may take a shower when you go home.  You may resume a normal diet and activities as directed. Eat plenty of fruits and vegetables to help prevent constipation.  Drink enough fluids to keep your urine clear or pale yellow. This also helps prevent constipation.  Take rest breaks during the day as needed.  Avoid lifting anything heavier than 25 pounds (11.3 kg) or driving for 4 weeks or until your health care provider says it is okay.  Follow up with your health care provider as directed. Ask your health care provider when you need to return to have your stitches or staples removed. SEEK MEDICAL CARE IF:  You have redness, swelling, or increasing pain in the incision area.  You see pus coming from the incision area.  You have a fever. SEEK IMMEDIATE MEDICAL CARE IF:   You have chest pain or shortness of breath.  You have pain or swelling in your legs.  You have persistent nausea and vomiting.  Your wound breaks open after stitches or staples have been removed.  You have increasing abdominal pain that is not relieved with medicine. Document Released: 04/21/2011 Document Revised: 07/19/2013 Document Reviewed: 05/10/2013 [facility] Patient Information 2014 [place], [place].

## 2013-12-18 NOTE — Progress Notes (Deleted)
Pt ambulated in hallway with standby assist from NT. Pt ambulated approximately 100 feet. Pt's O2 saturation ranged from 90-93% on RA.  Pt tolerated well.  Assisted back to bed. Sister at bedside.

## 2013-12-18 NOTE — Discharge Summary (Signed)
Physician Discharge Summary  Patient ID: Cory Ryan MRN: [id number] DOB/AGE: December 28, 1940 73 y.o.  Admit date: 12/13/2013 Discharge date: 12/18/2013  Admission Diagnoses: Colon neoplasm, chronic atrial fibrillation, chronic anticoagulation, hypertension, mild renal insufficiency  Discharge Diagnoses: Same Active Problems:   Colon neoplasm   Protein-calorie malnutrition, severe   Discharged Condition: good  Hospital Course: Patient is a 73 year old white male who is referred to my care by oncology at [facility] for a total colectomy. He had a large colonic neoplasm in the descending colon as well as multiple polyps throughout the colon. He is status post a sigmoid colectomy in the past for colon carcinoma. He did not require chemotherapy. The patient underwent total colectomy on 12/13/2013 after stopping his Coumadin. He tolerated his procedure well. His postoperative course was remarkable for loose frequent bowel movements soon after the surgery which were secondary to his colectomy. His Coumadin was restarted once his bowel function returned. His INR is 1.6 on discharge. He will be followed by cardiology here at any [facility] hospital. They have seen him before. Final pathology revealed a T2, N0, M0 adenocarcinoma of the colon. Patient is aware.  Treatments: surgery: Total colectomy on 12/13/2013  Discharge Exam: Blood pressure 130/58, pulse 92, temperature 98.2 F (36.8 C), temperature source Oral, resp. rate 18, height 5\' 10"  (1.778 m), weight 80.1 kg (176 lb 9.4 oz), SpO2 98.00%. General appearance: alert, cooperative and no distress Resp: clear to auscultation bilaterally Cardio: irregularly irregular rhythm GI: Soft. Incision healing well. Active bowel sounds appreciated.  Disposition: 06-Home-Health Care Svc     Medication List         ALPRAZolam 1 MG tablet  Commonly known as:  XANAX  Take 1 mg by mouth as needed.     carvedilol 6.25 MG tablet  Commonly known  as:  COREG  Take 1 tablet (6.25 mg total) by mouth 2 (two) times daily with a meal.     furosemide 80 MG tablet  Commonly known as:  LASIX  Take 0.5 tablets (40 mg total) by mouth 2 (two) times daily. Taking 11/2 tablets bid     HYDROcodone-acetaminophen 5-325 MG per tablet  Commonly known as:  NORCO  Take 1-2 tablets by mouth every 4 (four) hours as needed.     Ipratropium-Albuterol 20-100 MCG/ACT Aers respimat  Commonly known as:  COMBIVENT RESPIMAT  Inhale 1 puff into the lungs as needed.     loperamide 2 MG capsule  Commonly known as:  IMODIUM  Take 1 capsule (2 mg total) by mouth as needed for diarrhea or loose stools.     MILK OF MAGNESIA PO  Take 30 mLs by mouth as needed.     potassium chloride SA 20 MEQ tablet  Commonly known as:  K-DUR,KLOR-CON  Take 10 mEq by mouth 2 (two) times daily. Taking 1/2 tablet bid     warfarin 5 MG tablet  Commonly known as:  COUMADIN  Take 2.5-5 mg by mouth daily. Take 1 tablet (5mg ) daily except 1/2 tablet (2.5mg ) on Wednesday           Follow-up Information   Follow up with [name], MD. Schedule an appointment as soon as possible for a visit on 12/28/2013.   Specialty:  General Surgery   Contact information:   [address] [address] [phone]       Follow up with [facility] On 12/25/2013. (To check coumadin level)    Specialty:  Cardiology   Contact  information:   [address] [place] [address] [phone]      Signed: [name] 12/18/2013, 1:18 PM

## 2013-12-18 NOTE — Plan of Care (Signed)
Problem: Phase II Progression Outcomes Goal: Progress activity as tolerated unless otherwise ordered Outcome: Completed/Met Date Met:  12/18/13 Pt ambulating in hallway, independently, with standard walker. Goal: Dressings dry/intact Outcome: Not Applicable Date Met:  77/93/96 Incision OTA, to be cleansed once a day with soap and water.

## 2013-12-18 NOTE — Progress Notes (Signed)
INITIAL NUTRITION ASSESSMENT  DOCUMENTATION CODES Per approved criteria  -Severe malnutrition in the context of acute illness or injury   INTERVENTION: Ensure Complete po BID, each supplement provides 350 kcal and 13 grams of protein  NUTRITION DIAGNOSIS: Inadequate oral intake related to decreased appetite as evidenced by PO: 20-50%.   Goal: Pt will meet >90% of estimated nutritional needs  Monitor:  PO intake, labs, skin assessments, weight changes, I/O's  Reason for Assessment: LOS, poor po's  73 y.o. male  Admitting Dx: <principal problem not specified>  ASSESSMENT: Pt admitted for colon neoplasm. S/p total colectomy on 12/13/13. Noted adenocarcinoma was found on biopsy.  Pt reports good appetite PTA and reports minor weight loss 2-3 weeks ago. Wt hx reveals hx of weight gain over the past year (18.9% wt gain x 1 year, 6% wt gain x 6 months, and 1.7% wt gain x 1 week).  Pt reports tolerating diet well, but states he "is taking it easy" with his food intake to help him heal after surgery. Discussed importance of good PO intake to support healing and pt was agreeable to addition of supplement to help meet nutrition goals.   Nutrition Focused Physical Exam:  Subcutaneous Fat:  Orbital Region: WDL Upper Arm Region: WDL Thoracic and Lumbar Region: WDL  Muscle:  Temple Region: WDL Clavicle Bone Region: moderate depletion Clavicle and Acromion Bone Region: WDL Scapular Bone Region: moderate depletion Dorsal Hand: WDL Patellar Region: WDL Anterior Thigh Region: WDL Posterior Calf Region: WDL  Edema: none present  Pt meets criteria for severe MALNUTRITION in the context of chronic illness as evidenced by <50% energy intake x 5 days, moderate muscle dpletion.  Height: Ht Readings from Last 1 Encounters:  12/13/13 5\' 10"  (1.778 m)    Weight: Wt Readings from Last 1 Encounters:  12/16/13 176 lb 9.4 oz (80.1 kg)    Ideal Body Weight: 166#  % Ideal Body Weight:  106%  Wt Readings from Last 10 Encounters:  12/16/13 176 lb 9.4 oz (80.1 kg)  12/16/13 176 lb 9.4 oz (80.1 kg)  12/08/13 173 lb (78.472 kg)  06/15/13 166 lb (75.297 kg)  11/28/12 148 lb 3.2 oz (67.223 kg)  10/27/12 166 lb 7.2 oz (75.5 kg)  10/27/12 166 lb 7.2 oz (75.5 kg)  10/25/12 164 lb (74.39 kg)  10/25/12 164 lb (74.39 kg)  10/20/12 164 lb (74.39 kg)    Usual Body Weight: 165#  % Usual Body Weight: 107%  BMI:  Body mass index is 25.34 kg/(m^2). Meets criteria for overweight.  Estimated Nutritional Needs: Kcal: [phone] daily Protein: 100-120 grams daily Fluid: 2.4-2.8 L daily  Skin: abdominal incision  Diet Order: General  EDUCATION NEEDS: -Education needs addressed   Intake/Output Summary (Last 24 hours) at 12/18/13 1216 Last data filed at 12/18/13 0900  Gross per 24 hour  Intake    800 ml  Output      0 ml  Net    800 ml    Last BM: 12/18/13  Labs:   Recent Labs Lab 12/14/13 0542 12/15/13 0543 12/16/13 0657 12/17/13 0643 12/18/13 0639  NA 138 139 139 139 140  K 4.2 4.3 3.3* 3.4* 4.0  CL 101 101 96 97 97  CO2 29 30 33* 32 31  BUN 25* 16 12 13 16   CREATININE 1.66* 1.61* 1.32 1.57* 1.59*  CALCIUM 7.9* 7.8* 8.0* 8.1* 8.1*  MG 1.6 2.0  --   --   --   PHOS 2.9 1.8* 1.9* 1.8* 2.2*  GLUCOSE 135* 168* 171* 146* 134*    CBG (last 3)  No results found for this basename: GLUCAP,  in the last 72 hours  Scheduled Meds: . carvedilol  6.25 mg Oral BID WC  . furosemide  40 mg Oral BID  . pantoprazole  40 mg Oral Q1200  . Warfarin - Pharmacist Dosing Inpatient   Does not apply Q24H    Continuous Infusions:   Past Medical History  Diagnosis Date  . Atrial fibrillation   . Chronic systolic heart failure     LVEF 40-45%  . Emphysema   . Coronary atherosclerosis of native coronary artery     Multivessel status post CABG  . Gastric erosions 12/2005    Per EGD - Dr. [name]  . Colon polyps 12/2005    Per colonoscopy  . Ischemic cardiomyopathy   .  Aortic stenosis     Moderate  . Mitral regurgitation     Moderate  . Pulmonary nodules   . Hypothyroidism   . Psoriasis   . Myocardial infarction     Remote  . CKD (chronic kidney disease) stage 3, GFR 30-59 ml/min   . Anemia   . Colon cancer   . Atrial fibrillation   . CHF (congestive heart failure)   . Anxiety   . Shortness of breath     exertion  . GERD (gastroesophageal reflux disease)     Past Surgical History  Procedure Laterality Date  . Coronary artery bypass graft  March 2004    LIMA to LAD, SVG to diagonal, SVG to OM 2, SVG to RCA  . Flexible sigmoidoscopy  10/25/2012    Procedure: FLEXIBLE SIGMOIDOSCOPY;  Surgeon: [name], MD;  Location: [facility];  Service: Gastroenterology;  Laterality: N/A;  . Partial colectomy  10/26/2012    Procedure: PARTIAL COLECTOMY;  Surgeon: [name], MD;  Location: [facility];  Service: General;  Laterality: N/A;  Closure of Colovesical Fistula  . Colectomy N/A 12/13/2013    Procedure: TOTAL COLECTOMY;  Surgeon: [name], MD;  Location: [facility];  Service: General;  Laterality: N/A;    Ani Deoliveira A. Jimmye Norman, RD, LDN Pager: [phone]

## 2013-12-18 NOTE — Progress Notes (Signed)
Pt discharged home today per Dr. Jenkins. Pt's IV site D/C'd and WNL. Pt's VSS. Pt provided with home medication list, discharge instructions and prescriptions. Verbalized understanding. Pt left floor via WC in stable condition accompanied by NT. 

## 2013-12-19 ENCOUNTER — Other Ambulatory Visit: Payer: Self-pay | Admitting: Cardiology

## 2013-12-19 LAB — TYPE AND SCREEN
ABO/RH(D): O POS
ANTIBODY SCREEN: NEGATIVE
Unit division: 0
Unit division: 0

## 2013-12-27 ENCOUNTER — Ambulatory Visit (INDEPENDENT_AMBULATORY_CARE_PROVIDER_SITE_OTHER): Payer: Medicare Other | Admitting: *Deleted

## 2013-12-27 DIAGNOSIS — I4891 Unspecified atrial fibrillation: Secondary | ICD-10-CM

## 2013-12-27 DIAGNOSIS — Z7901 Long term (current) use of anticoagulants: Secondary | ICD-10-CM

## 2013-12-27 DIAGNOSIS — Z515 Encounter for palliative care: Secondary | ICD-10-CM | POA: Insufficient documentation

## 2013-12-27 DIAGNOSIS — Z5181 Encounter for therapeutic drug level monitoring: Secondary | ICD-10-CM

## 2013-12-27 LAB — POCT INR: INR: 3.4

## 2014-01-01 ENCOUNTER — Ambulatory Visit (INDEPENDENT_AMBULATORY_CARE_PROVIDER_SITE_OTHER): Payer: Medicare Other | Admitting: Adult Health

## 2014-01-01 ENCOUNTER — Encounter: Payer: Self-pay | Admitting: Adult Health

## 2014-01-01 VITALS — BP 129/71 | HR 85 | Ht 69.0 in | Wt 152.0 lb

## 2014-01-01 DIAGNOSIS — I2510 Atherosclerotic heart disease of native coronary artery without angina pectoris: Secondary | ICD-10-CM

## 2014-01-01 DIAGNOSIS — R42 Dizziness and giddiness: Secondary | ICD-10-CM

## 2014-01-01 DIAGNOSIS — I509 Heart failure, unspecified: Secondary | ICD-10-CM

## 2014-01-01 DIAGNOSIS — I5022 Chronic systolic (congestive) heart failure: Secondary | ICD-10-CM

## 2014-01-01 DIAGNOSIS — D490 Neoplasm of unspecified behavior of digestive system: Secondary | ICD-10-CM

## 2014-01-01 MED ORDER — FUROSEMIDE 80 MG PO TABS: 40.0000 mg | ORAL_TABLET | Freq: Every day | ORAL | Status: DC

## 2014-01-01 NOTE — Assessment & Plan Note (Signed)
He is status post colectomy with pathology report revealing invasive colorectal adenocarcinoma. He is being followed by Dr. [name] in [place].

## 2014-01-01 NOTE — Patient Instructions (Signed)
Your physician recommends that you schedule a follow-up appointment in: 1 month  Your physician has recommended you make the following change in your medication:   Decrease lasix to 1/2 tablet in the morning  Your physician recommends that you return for lab work this week. CBC, BMET

## 2014-01-01 NOTE — Progress Notes (Deleted)
Name: Cory Ryan    DOB: 1941/07/15  Age: 73 y.o.  MR#: [id number]       PCP:  Leonides Grills, MD      Insurance: Payor: [employer] MEDICARE / Plan: AARP MEDICARE COMPLETE / Product Type: *No Product type* /   CC:    Chief Complaint  Patient presents with  . Coronary Artery Disease  . Cardiac Valve Problem    Aortic  . Atrial Fibrillation    VS Filed Vitals:   01/01/14 1414  BP: 129/71  Pulse: 85  Height: 5\' 9"  (1.753 m)  Weight: 152 lb (68.947 kg)    Weights Current Weight  01/01/14 152 lb (68.947 kg)  12/16/13 176 lb 9.4 oz (80.1 kg)  12/16/13 176 lb 9.4 oz (80.1 kg)    Blood Pressure  BP Readings from Last 3 Encounters:  01/01/14 129/71  12/18/13 110/69  12/18/13 110/69     Admit date:  (Not on file) Last encounter with RMR:  Visit date not found   Allergy Review of patient's allergies indicates no known allergies.  Current Outpatient Prescriptions  Medication Sig Dispense Refill  . ALPRAZolam (XANAX) 1 MG tablet Take 1 mg by mouth as needed.      . carvedilol (COREG) 6.25 MG tablet TAKE (1) TABLET BY MOUTH TWICE DAILY WITH A MEAL.  60 tablet  3  . furosemide (LASIX) 80 MG tablet Take 0.5 tablets (40 mg total) by mouth 2 (two) times daily. Taking 11/2 tablets bid  60 tablet  6  . HYDROcodone-acetaminophen (NORCO) 5-325 MG per tablet Take 1-2 tablets by mouth every 4 (four) hours as needed.  40 tablet  0  . Ipratropium-Albuterol (COMBIVENT RESPIMAT) 20-100 MCG/ACT AERS respimat Inhale 1 puff into the lungs as needed.  1 Inhaler  1  . loperamide (IMODIUM) 2 MG capsule Take 1 capsule (2 mg total) by mouth as needed for diarrhea or loose stools.  20 capsule  2  . potassium chloride SA (K-DUR,KLOR-CON) 20 MEQ tablet Take 20 mEq by mouth 2 (two) times daily. Taking 1/2 tablet bid      . warfarin (COUMADIN) 5 MG tablet Take 2.5-5 mg by mouth daily. Take 1 tablet (5mg ) daily except 1/2 tablet (2.5mg ) on Wednesday      . Magnesium Hydroxide (MILK OF MAGNESIA PO)  Take 30 mLs by mouth as needed.       No current facility-administered medications for this visit.    Discontinued Meds:   There are no discontinued medications.  Patient Active Problem List   Diagnosis Date Noted  . Encounter for therapeutic drug monitoring 12/27/2013  . Protein-calorie malnutrition, severe 12/18/2013  . Colon neoplasm 12/13/2013  . Coronary atherosclerosis of native coronary artery 06/15/2013  . Aortic stenosis 06/15/2013  . Mitral regurgitation 06/15/2013  . CKD (chronic kidney disease) stage 3, GFR 30-59 ml/min 06/15/2013  . Atrial fibrillation 12/20/2012  . Long term (current) use of anticoagulants 12/20/2012  . Systolic CHF, chronic 23/76/2831    LABS    Component Value Date/Time   NA 140 12/18/2013 0639   NA 139 12/17/2013 0643   NA 139 12/16/2013 0657   K 4.0 12/18/2013 0639   K 3.4* 12/17/2013 0643   K 3.3* 12/16/2013 0657   CL 97 12/18/2013 0639   CL 97 12/17/2013 0643   CL 96 12/16/2013 0657   CO2 31 12/18/2013 0639   CO2 32 12/17/2013 0643   CO2 33* 12/16/2013 0657   GLUCOSE 134* 12/18/2013 5176  GLUCOSE 146* 12/17/2013 0643   GLUCOSE 171* 12/16/2013 0657   BUN 16 12/18/2013 0639   BUN 13 12/17/2013 0643   BUN 12 12/16/2013 0657   CREATININE 1.59* 12/18/2013 0639   CREATININE 1.57* 12/17/2013 0643   CREATININE 1.32 12/16/2013 0657   CALCIUM 8.1* 12/18/2013 0639   CALCIUM 8.1* 12/17/2013 0643   CALCIUM 8.0* 12/16/2013 0657   GFRNONAA 41* 12/18/2013 0639   GFRNONAA 42* 12/17/2013 0643   GFRNONAA 51* 12/16/2013 0657   GFRAA 48* 12/18/2013 0639   GFRAA 48* 12/17/2013 0643   GFRAA 60* 12/16/2013 0657   CMP     Component Value Date/Time   NA 140 12/18/2013 0639   K 4.0 12/18/2013 0639   CL 97 12/18/2013 0639   CO2 31 12/18/2013 0639   GLUCOSE 134* 12/18/2013 0639   BUN 16 12/18/2013 0639   CREATININE 1.59* 12/18/2013 0639   CALCIUM 8.1* 12/18/2013 0639   PROT 6.9 12/08/2013 1013   ALBUMIN 3.7 12/08/2013 1013   AST 14 12/08/2013 1013   ALT 11 12/08/2013 1013   ALKPHOS 66 12/08/2013 1013   BILITOT 0.7  12/08/2013 1013   GFRNONAA 41* 12/18/2013 0639   GFRAA 48* 12/18/2013 0639       Component Value Date/Time   WBC 6.0 12/16/2013 0657   WBC 5.3 12/15/2013 0543   WBC 6.0 12/14/2013 0542   HGB 11.3* 12/16/2013 0657   HGB 9.8* 12/15/2013 0543   HGB 10.1* 12/14/2013 0542   HCT 33.9* 12/16/2013 0657   HCT 29.0* 12/15/2013 0543   HCT 29.9* 12/14/2013 0542   MCV 92.9 12/16/2013 0657   MCV 92.9 12/15/2013 0543   MCV 92.0 12/14/2013 0542    Lipid Panel     Component Value Date/Time   CHOL 194 06/29/2013 1128   TRIG 91 06/29/2013 1128   HDL 50 06/29/2013 1128   CHOLHDL 3.9 06/29/2013 1128   VLDL 18 06/29/2013 1128   LDLCALC 126* 06/29/2013 1128    ABG No results found for this basename: phart, pco2, pco2art, po2, po2art, hco3, tco2, acidbasedef, o2sat     Lab Results  Component Value Date   TSH 0.943 02/06/2012   BNP (last 3 results) No results found for this basename: PROBNP,  in the last 8760 hours Cardiac Panel (last 3 results) No results found for this basename: CKTOTAL, CKMB, TROPONINI, RELINDX,  in the last 72 hours  Iron/TIBC/Ferritin No results found for this basename: iron, tibc, ferritin     EKG Orders placed in visit on 06/15/13  . EKG 12-LEAD     Prior Assessment and Plan Problem List as of 01/01/2014     Cardiovascular and Mediastinum   Systolic CHF, chronic   Last Assessment & Plan   06/15/2013 Office Visit Written 06/15/2013  9:09 AM by [name], MD     Also clinically stable by symptom review. LVEF previously 40-45% by echocardiography, was normal by Myoview earlier this year. Followup echocardiogram to be obtained.    Atrial fibrillation   Last Assessment & Plan   06/15/2013 Office Visit Written 06/15/2013  9:08 AM by [name], MD     Presumably paroxysmal, in sinus rhythm today by ECG. No palpitations. Continue Coumadin. He is being established in the Coumadin clinic.    Coronary atherosclerosis of native coronary artery   Last Assessment & Plan   06/15/2013 Office  Visit Written 06/15/2013  9:08 AM by [name], MD     Clinically stable with multivessel CAD status  post CABG as outlined above. Myoview from January reviewed, overall low risk. Medication refills provided.    Aortic stenosis   Last Assessment & Plan   06/15/2013 Office Visit Written 06/15/2013  9:09 AM by [name], MD     Mild to moderate by prior assessment.    Mitral regurgitation   Last Assessment & Plan   06/15/2013 Office Visit Written 06/15/2013  9:09 AM by [name], MD     Moderate prior assessment.      Digestive   Colon neoplasm     Genitourinary   CKD (chronic kidney disease) stage 3, GFR 30-59 ml/min   Last Assessment & Plan   06/15/2013 Office Visit Written 06/15/2013  9:10 AM by [name], MD     Presumably reason that he has not been on ACE inhibitor, also supported by discussion with patient and son today. Need to keep a close eye on blood pressure with primary care provider. Other agents could be chosen.      Other   Long term (current) use of anticoagulants   Protein-calorie malnutrition, severe   Encounter for therapeutic drug monitoring       Imaging: No results found.

## 2014-01-01 NOTE — Assessment & Plan Note (Signed)
No evidence of fluid overload at this time. In fact he appears mildly dehydrated. I will decrease his Lasix to 40 mg daily with incentive 40 mg twice a day. He will have a CBC and a BMET to evaluate for kidney function and poor anemia postoperatively. He will be seen on followup in a month with either [name] myself for ongoing assessment.

## 2014-01-01 NOTE — Assessment & Plan Note (Signed)
He is without cardiac complaints at this time. No recurrent chest pain, some shortness of breath related to deconditioning. His recent echocardiogram in September 2004 2 revealed normal LV systolic function. As stated will decrease Lasix dose. This will help with some mild dizziness. He did have orthostatic blood pressures completed which were negative.

## 2014-01-01 NOTE — Progress Notes (Signed)
HPI: [name] is a 73 y/o patient of [name] with known history of mixed systolic and diastolic dysfunction, CAD, s/p CABG, Mitral regurgitation, and PAF. Most recent myoview from Jan of 2014, demonstrated low risk. He continues in the coumadin clinic. The patient underwent total colectomy on 12/13/2013 after stopping his Coumadin.   Since discharge he has lost 20 pounds, is become dizzy with standing or getting out of bed. He denies any chest pain. He is not hungry, and does not eat or drink much. He is to followup with Dr. [name] in the [place] office in 2 days to discuss pathology report status post colectomy. 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 This was not discussed with the patient on this office visit.    Denies chest pain, on exertion, he unfortunately is not eating and drinking very well.       No Known Allergies  Current Outpatient Prescriptions  Medication Sig Dispense Refill  . ALPRAZolam (XANAX) 1 MG tablet Take 1 mg by mouth as needed.      . carvedilol (COREG) 6.25 MG tablet TAKE (1) TABLET BY MOUTH TWICE DAILY WITH A MEAL.  60 tablet  3  . furosemide (LASIX) 80 MG tablet Take 0.5 tablets (40 mg total) by mouth 2 (two) times daily. Taking 11/2 tablets bid  60 tablet  6  . HYDROcodone-acetaminophen (NORCO) 5-325 MG per tablet Take 1-2 tablets by mouth every 4 (four) hours as needed.  40 tablet  0  . Ipratropium-Albuterol (COMBIVENT RESPIMAT) 20-100 MCG/ACT AERS respimat Inhale 1 puff into the lungs as needed.  1 Inhaler  1  . loperamide (IMODIUM) 2 MG capsule Take 1 capsule (2 mg total) by mouth as needed for diarrhea or loose stools.  20 capsule  2  . potassium chloride SA (K-DUR,KLOR-CON) 20 MEQ tablet Take 20 mEq by mouth 2 (two) times daily. Taking 1/2 tablet bid      . warfarin (COUMADIN) 5 MG tablet Take 2.5-5 mg by mouth daily. Take 1 tablet (5mg ) daily except 1/2 tablet (2.5mg ) on Wednesday       . Magnesium Hydroxide (MILK OF MAGNESIA PO) Take 30 mLs by mouth as needed.       No current facility-administered medications for this visit.    Past Medical History  Diagnosis Date  . Atrial fibrillation   . Chronic systolic heart failure     LVEF 40-45%  . Emphysema   . Coronary atherosclerosis of native coronary artery     Multivessel status post CABG  . Gastric erosions 12/2005    Per EGD - Dr. [name]  . Colon polyps 12/2005    Per colonoscopy  . Ischemic cardiomyopathy   . Aortic stenosis     Moderate  . Mitral regurgitation     Moderate  . Pulmonary nodules   . Hypothyroidism   . Psoriasis   . Myocardial infarction     Remote  . CKD (chronic kidney disease) stage 3, GFR 30-59 ml/min   . Anemia   . Colon cancer   . Atrial fibrillation   . CHF (congestive heart failure)   . Anxiety   . Shortness of breath     exertion  . GERD (gastroesophageal reflux disease)     Past Surgical History  Procedure Laterality Date  . Coronary artery bypass graft  March 2004    LIMA to LAD, SVG to diagonal, SVG to OM 2, SVG to RCA  . Flexible sigmoidoscopy  10/25/2012    Procedure: FLEXIBLE SIGMOIDOSCOPY;  Surgeon: [name], MD;  Location: [facility];  Service: Gastroenterology;  Laterality: N/A;  . Partial colectomy  10/26/2012    Procedure: PARTIAL COLECTOMY;  Surgeon: [name], MD;  Location: [facility];  Service: General;  Laterality: N/A;  Closure of Colovesical Fistula  . Colectomy N/A 12/13/2013    Procedure: TOTAL COLECTOMY;  Surgeon: [name], MD;  Location: [facility];  Service: General;  Laterality: N/A;    ROS: Review of systems complete and found to be negative unless listed above  PHYSICAL EXAM BP 129/71  Pulse 85  Ht 5\' 9"  (1.753 m)  Wt 152 lb (68.947 kg)  BMI 22.44 kg/m2  General: Well developed, well nourished, in no acute distress,  Frail using a walker for ambulation. Head: Eyes PERRLA, No xanthomas.   Normal cephalic and atramatic  Lungs:  Clear bilaterally to auscultation and percussion. Heart: HRIR S1 S2, without MRG.  Pulses are 2+ & equal.            No carotid bruit. No JVD.  No abdominal bruits. No femoral bruits. Abdomen: Bowel sounds are positive, abdomen soft and non-tender without masses or                  Hernia's noted. Msk:  Back normal, normal gait. Normal strength and tone for age. Extremities: No clubbing, cyanosis or edema.  DP +1 Neuro: Alert and oriented X 3. Psych:  Good affect, responds appropriately    ASSESSMENT AND PLAN

## 2014-01-17 ENCOUNTER — Telehealth: Payer: Self-pay | Admitting: Genetic Counselor

## 2014-01-17 ENCOUNTER — Ambulatory Visit (INDEPENDENT_AMBULATORY_CARE_PROVIDER_SITE_OTHER): Payer: Medicare Other | Admitting: *Deleted

## 2014-01-17 DIAGNOSIS — Z5181 Encounter for therapeutic drug level monitoring: Secondary | ICD-10-CM

## 2014-01-17 DIAGNOSIS — Z7901 Long term (current) use of anticoagulants: Secondary | ICD-10-CM

## 2014-01-17 DIAGNOSIS — I4891 Unspecified atrial fibrillation: Secondary | ICD-10-CM

## 2014-01-17 LAB — POCT INR: INR: 4.1

## 2014-01-17 NOTE — Telephone Encounter (Signed)
CALLED PATIENT TO SCHEDULE GENETIC APPT PER PATIENT CALL BACK TO SPEAK WITH SON ON TODAY OR TOMORROW.  CALLED [facility] [place].

## 2014-02-02 ENCOUNTER — Ambulatory Visit: Payer: Medicare Other | Admitting: Cardiology

## 2014-02-05 ENCOUNTER — Ambulatory Visit (INDEPENDENT_AMBULATORY_CARE_PROVIDER_SITE_OTHER): Payer: Medicare Other | Admitting: *Deleted

## 2014-02-05 DIAGNOSIS — I4891 Unspecified atrial fibrillation: Secondary | ICD-10-CM

## 2014-02-05 DIAGNOSIS — Z5181 Encounter for therapeutic drug level monitoring: Secondary | ICD-10-CM

## 2014-02-05 DIAGNOSIS — Z7901 Long term (current) use of anticoagulants: Secondary | ICD-10-CM

## 2014-02-05 LAB — POCT INR: INR: 2.6

## 2014-02-26 ENCOUNTER — Ambulatory Visit (INDEPENDENT_AMBULATORY_CARE_PROVIDER_SITE_OTHER): Payer: Medicare Other | Admitting: *Deleted

## 2014-02-26 DIAGNOSIS — Z5181 Encounter for therapeutic drug level monitoring: Secondary | ICD-10-CM

## 2014-02-26 DIAGNOSIS — I4891 Unspecified atrial fibrillation: Secondary | ICD-10-CM

## 2014-02-26 DIAGNOSIS — Z7901 Long term (current) use of anticoagulants: Secondary | ICD-10-CM

## 2014-02-26 LAB — POCT INR: INR: 1.8

## 2014-03-01 ENCOUNTER — Other Ambulatory Visit: Payer: Self-pay | Admitting: Cardiology

## 2014-03-01 ENCOUNTER — Telehealth: Payer: Self-pay | Admitting: Cardiology

## 2014-03-01 DIAGNOSIS — J449 Chronic obstructive pulmonary disease, unspecified: Secondary | ICD-10-CM

## 2014-03-01 NOTE — Telephone Encounter (Signed)
Received fax refill request  Rx # [id number] Medication:  Combivent Respimat Inhal Qty 4 GM Sig:  Inhale 1 puff every 6 hours Physician:  Domenic Polite

## 2014-03-01 NOTE — Telephone Encounter (Signed)
Called [facility] to advise them that we can't refill patients inhaler. One time courtesy pcp needs to refill

## 2014-03-05 ENCOUNTER — Other Ambulatory Visit: Payer: Self-pay | Admitting: Cardiology

## 2014-03-19 ENCOUNTER — Ambulatory Visit (INDEPENDENT_AMBULATORY_CARE_PROVIDER_SITE_OTHER): Payer: Medicare Other | Admitting: *Deleted

## 2014-03-19 DIAGNOSIS — Z7901 Long term (current) use of anticoagulants: Secondary | ICD-10-CM

## 2014-03-19 DIAGNOSIS — Z5181 Encounter for therapeutic drug level monitoring: Secondary | ICD-10-CM

## 2014-03-19 DIAGNOSIS — I4891 Unspecified atrial fibrillation: Secondary | ICD-10-CM

## 2014-03-19 LAB — POCT INR: INR: 2.5

## 2014-04-16 ENCOUNTER — Ambulatory Visit (INDEPENDENT_AMBULATORY_CARE_PROVIDER_SITE_OTHER): Payer: Medicare Other | Admitting: *Deleted

## 2014-04-16 DIAGNOSIS — I4891 Unspecified atrial fibrillation: Secondary | ICD-10-CM

## 2014-04-16 DIAGNOSIS — Z5181 Encounter for therapeutic drug level monitoring: Secondary | ICD-10-CM

## 2014-04-16 DIAGNOSIS — Z7901 Long term (current) use of anticoagulants: Secondary | ICD-10-CM

## 2014-04-16 LAB — POCT INR: INR: 2.9

## 2014-04-16 MED ORDER — WARFARIN SODIUM 5 MG PO TABS: ORAL_TABLET | ORAL | Status: DC

## 2014-04-23 ENCOUNTER — Other Ambulatory Visit: Payer: Self-pay | Admitting: Cardiology

## 2014-05-14 ENCOUNTER — Ambulatory Visit (INDEPENDENT_AMBULATORY_CARE_PROVIDER_SITE_OTHER): Payer: Medicare Other | Admitting: *Deleted

## 2014-05-14 DIAGNOSIS — I4891 Unspecified atrial fibrillation: Secondary | ICD-10-CM

## 2014-05-14 DIAGNOSIS — Z7901 Long term (current) use of anticoagulants: Secondary | ICD-10-CM

## 2014-05-14 DIAGNOSIS — Z5181 Encounter for therapeutic drug level monitoring: Secondary | ICD-10-CM

## 2014-05-14 LAB — POCT INR: INR: 2.4

## 2014-06-25 ENCOUNTER — Ambulatory Visit (INDEPENDENT_AMBULATORY_CARE_PROVIDER_SITE_OTHER): Payer: Medicare Other | Admitting: *Deleted

## 2014-06-25 DIAGNOSIS — Z7901 Long term (current) use of anticoagulants: Secondary | ICD-10-CM

## 2014-06-25 DIAGNOSIS — Z5181 Encounter for therapeutic drug level monitoring: Secondary | ICD-10-CM

## 2014-06-25 DIAGNOSIS — I4891 Unspecified atrial fibrillation: Secondary | ICD-10-CM

## 2014-06-25 LAB — POCT INR: INR: 3.4

## 2014-06-29 NOTE — Progress Notes (Signed)
HPI: [name] is a 73 year old male patient of Dr. Domenic Ryan in followup for ongoing assessment and management of next CHF with both systolic and diastolic dysfunction, history of CAD, status post CABG, mitral regurg, paroxysmal atrial fibrillation. She had a low risk Myoview in January 2014. She is followed in our Coumadin clinic. She was last seen in the office in March of 2015. 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 The patient underwent a total colectomy in March.  She was stable with no evidence of fluid overload. Her Lasix was decreased to 40 mg daily from 40 mg twice a day, and she appeared mildly dehydrated. A CBC and BMET were ordered. Said he was found to be 140, potassium 4.0, chloride 97, CO2 31, BUN 16, creatinine 1.5. She is here for six-month followup.  He is without cardiac complaint today. He has some lung congestion and is requesting if ok to use Muccinex. Otherwise he is doing well and is medically compliant.   No Known Allergies  Current Outpatient Prescriptions  Medication Sig Dispense Refill  . ALPRAZolam (XANAX) 1 MG tablet Take 1 mg by mouth as needed.      . carvedilol (COREG) 6.25 MG tablet TAKE (1) TABLET BY MOUTH TWICE DAILY WITH A MEAL.  60 tablet  3  . furosemide (LASIX) 80 MG tablet Take 0.5 tablets (40 mg total) by mouth daily.  30 tablet  6  . HYDROcodone-acetaminophen (NORCO) 5-325 MG per tablet Take 1-2 tablets by mouth every 4 (four) hours as needed.  40 tablet  0  . Ipratropium-Albuterol (COMBIVENT RESPIMAT) 20-100 MCG/ACT AERS respimat Inhale 1 puff into the lungs as needed.  1 Inhaler  1  . loperamide (IMODIUM) 2 MG capsule Take 1 capsule (2 mg total) by mouth as needed for diarrhea or loose stools.  20 capsule  2  . Magnesium Hydroxide (MILK OF MAGNESIA PO) Take 30 mLs by mouth as needed.      . potassium chloride SA (K-DUR,KLOR-CON) 20 MEQ tablet Take 20 mEq by  mouth 2 (two) times daily. Taking 1/2 tablet bid      . warfarin (COUMADIN) 5 MG tablet Take 1 tablet daily except 1/2 tablet on Sundays, Tuesdays and Thursdays  30 tablet  6  . Dextromethorphan-Guaifenesin 60-1200 MG per 12 hr tablet Take 1 tablet by mouth every 12 (twelve) hours.  60 tablet  0   No current facility-administered medications for this visit.    Past Medical History  Diagnosis Date  . Atrial fibrillation   . Chronic systolic heart failure     LVEF 40-45%  . Emphysema   . Coronary atherosclerosis of native coronary artery     Multivessel status post CABG  . Gastric erosions 12/2005    Per EGD - Dr. [name]  . Colon polyps 12/2005    Per colonoscopy  . Ischemic cardiomyopathy   . Aortic stenosis     Moderate  . Mitral regurgitation     Moderate  . Pulmonary nodules   . Hypothyroidism   . Psoriasis   . Myocardial infarction     Remote  . CKD (chronic kidney disease) stage 3, GFR 30-59 ml/min   . Anemia   . Colon cancer   . Atrial fibrillation   . CHF (congestive heart failure)   . Anxiety   . Shortness of breath     exertion  . GERD (gastroesophageal reflux disease)     Past Surgical  History  Procedure Laterality Date  . Coronary artery bypass graft  March 2004    LIMA to LAD, SVG to diagonal, SVG to OM 2, SVG to RCA  . Flexible sigmoidoscopy  10/25/2012    Procedure: FLEXIBLE SIGMOIDOSCOPY;  Surgeon: [name], MD;  Location: [facility];  Service: Gastroenterology;  Laterality: N/A;  . Partial colectomy  10/26/2012    Procedure: PARTIAL COLECTOMY;  Surgeon: [name], MD;  Location: [facility];  Service: General;  Laterality: N/A;  Closure of Colovesical Fistula  . Colectomy N/A 12/13/2013    Procedure: TOTAL COLECTOMY;  Surgeon: [name], MD;  Location: [facility];  Service: General;  Laterality: N/A;    ROS: Review of systems complete and found to be negative unless listed above  PHYSICAL EXAM BP 135/84  Pulse 53  Ht 5\' 10"  (1.778 m)  Wt 172  lb (78.019 kg)  BMI 24.68 kg/m2  SpO2 96% General: Well developed, well nourished, in no acute distress Head: Eyes PERRLA, No xanthomas.   Normal cephalic and atramatic  Lungs: Some crackles in the bases, No wheezing. Heart: HRIR S1 S2, without MRG.  Pulses are 2+ & equal.            No carotid bruit. No JVD.  No abdominal bruits. No femoral bruits. Abdomen: Bowel sounds are positive, abdomen soft and non-tender without masses or                  Hernia's noted. Msk:  Back normal, normal gait. Normal strength and tone for age. Extremities: No clubbing, cyanosis or edema.  DP +1 Neuro: Alert and oriented X 3. Psych:  Good affect, responds appropriately   EKG:  Atrial fibrillation, ST-T wave abnormality in the inferio/lateral leads with T-wave inversion.  ASSESSMENT AND PLAN

## 2014-07-02 ENCOUNTER — Ambulatory Visit (INDEPENDENT_AMBULATORY_CARE_PROVIDER_SITE_OTHER): Payer: Medicare Other | Admitting: Adult Health

## 2014-07-02 ENCOUNTER — Encounter: Payer: Self-pay | Admitting: Adult Health

## 2014-07-02 VITALS — BP 135/84 | HR 53 | Ht 70.0 in | Wt 172.0 lb

## 2014-07-02 DIAGNOSIS — I350 Nonrheumatic aortic (valve) stenosis: Secondary | ICD-10-CM

## 2014-07-02 DIAGNOSIS — I359 Nonrheumatic aortic valve disorder, unspecified: Secondary | ICD-10-CM

## 2014-07-02 DIAGNOSIS — I509 Heart failure, unspecified: Secondary | ICD-10-CM

## 2014-07-02 DIAGNOSIS — I4891 Unspecified atrial fibrillation: Secondary | ICD-10-CM

## 2014-07-02 DIAGNOSIS — I5022 Chronic systolic (congestive) heart failure: Secondary | ICD-10-CM

## 2014-07-02 MED ORDER — DM-GUAIFENESIN ER 60-1200 MG PO TB12: 1.0000 | ORAL_TABLET | Freq: Two times a day (BID) | ORAL | Status: DC

## 2014-07-02 NOTE — Assessment & Plan Note (Signed)
No evidence of decompensation or edema. He is stable no changes in medications.

## 2014-07-02 NOTE — Patient Instructions (Signed)
Your physician has requested that you have an echocardiogram. Echocardiography is a painless test that uses sound waves to create images of your heart. It provides your doctor with information about the size and shape of your heart and how well your heart's chambers and valves are working. This procedure takes approximately one hour. There are no restrictions for this procedure.  Your physician wants you to follow-up in: 6 months. You will receive a reminder letter in the mail two months in advance. If you don't receive a letter, please call our office to schedule the follow-up appointment.  I have sent Mucinex 1200 mg twice daily to [place]  Thank you for choosing [facility]!!

## 2014-07-02 NOTE — Assessment & Plan Note (Signed)
Per last echo, stenosis is mild. No changes. Echo is reordered for follow up.

## 2014-07-02 NOTE — Assessment & Plan Note (Signed)
Heart rate is well controlled. No changes in medications. Continue coumadin with dosing per [place] coumadin clinic.

## 2014-07-02 NOTE — Progress Notes (Deleted)
Name: Cory Ryan    DOB: May 23, 1941  Age: 73 y.o.  MR#: [id number]       PCP:  Leonides Grills, MD      Insurance: Payor: [employer] MEDICARE / Plan: AARP MEDICARE COMPLETE / Product Type: *No Product type* /   CC:    Chief Complaint  Patient presents with  . Congestive Heart Failure  . Coronary Artery Disease    VS Filed Vitals:   07/02/14 1313  BP: 135/84  Pulse: 53  Height: 5\' 10"  (1.778 m)  Weight: 172 lb (78.019 kg)  SpO2: 96%    Weights Current Weight  07/02/14 172 lb (78.019 kg)  01/01/14 152 lb (68.947 kg)  12/16/13 176 lb 9.4 oz (80.1 kg)    Blood Pressure  BP Readings from Last 3 Encounters:  07/02/14 135/84  01/01/14 129/71  12/18/13 110/69     Admit date:  (Not on file) Last encounter with RMR:  Visit date not found   Allergy Review of patient's allergies indicates no known allergies.  Current Outpatient Prescriptions  Medication Sig Dispense Refill  . ALPRAZolam (XANAX) 1 MG tablet Take 1 mg by mouth as needed.      . carvedilol (COREG) 6.25 MG tablet TAKE (1) TABLET BY MOUTH TWICE DAILY WITH A MEAL.  60 tablet  3  . furosemide (LASIX) 80 MG tablet Take 0.5 tablets (40 mg total) by mouth daily.  30 tablet  6  . HYDROcodone-acetaminophen (NORCO) 5-325 MG per tablet Take 1-2 tablets by mouth every 4 (four) hours as needed.  40 tablet  0  . Ipratropium-Albuterol (COMBIVENT RESPIMAT) 20-100 MCG/ACT AERS respimat Inhale 1 puff into the lungs as needed.  1 Inhaler  1  . loperamide (IMODIUM) 2 MG capsule Take 1 capsule (2 mg total) by mouth as needed for diarrhea or loose stools.  20 capsule  2  . Magnesium Hydroxide (MILK OF MAGNESIA PO) Take 30 mLs by mouth as needed.      . potassium chloride SA (K-DUR,KLOR-CON) 20 MEQ tablet Take 20 mEq by mouth 2 (two) times daily. Taking 1/2 tablet bid      . warfarin (COUMADIN) 5 MG tablet Take 1 tablet daily except 1/2 tablet on Sundays, Tuesdays and Thursdays  30 tablet  6   No current facility-administered  medications for this visit.    Discontinued Meds:   There are no discontinued medications.  Patient Active Problem List   Diagnosis Date Noted  . Encounter for therapeutic drug monitoring 12/27/2013  . Protein-calorie malnutrition, severe 12/18/2013  . Colon neoplasm 12/13/2013  . Coronary atherosclerosis of native coronary artery 06/15/2013  . Aortic stenosis 06/15/2013  . Mitral regurgitation 06/15/2013  . CKD (chronic kidney disease) stage 3, GFR 30-59 ml/min 06/15/2013  . Atrial fibrillation 12/20/2012  . Long term (current) use of anticoagulants 12/20/2012  . Systolic CHF, chronic 52/05/222    LABS    Component Value Date/Time   NA 140 12/18/2013 0639   NA 139 12/17/2013 0643   NA 139 12/16/2013 0657   K 4.0 12/18/2013 0639   K 3.4* 12/17/2013 0643   K 3.3* 12/16/2013 0657   CL 97 12/18/2013 0639   CL 97 12/17/2013 0643   CL 96 12/16/2013 0657   CO2 31 12/18/2013 0639   CO2 32 12/17/2013 0643   CO2 33* 12/16/2013 0657   GLUCOSE 134* 12/18/2013 0639   GLUCOSE 146* 12/17/2013 0643   GLUCOSE 171* 12/16/2013 0657   BUN 16 12/18/2013 3612  BUN 13 12/17/2013 0643   BUN 12 12/16/2013 0657   CREATININE 1.59* 12/18/2013 0639   CREATININE 1.57* 12/17/2013 0643   CREATININE 1.32 12/16/2013 0657   CALCIUM 8.1* 12/18/2013 0639   CALCIUM 8.1* 12/17/2013 0643   CALCIUM 8.0* 12/16/2013 0657   GFRNONAA 41* 12/18/2013 0639   GFRNONAA 42* 12/17/2013 0643   GFRNONAA 51* 12/16/2013 0657   GFRAA 48* 12/18/2013 0639   GFRAA 48* 12/17/2013 0643   GFRAA 60* 12/16/2013 0657   CMP     Component Value Date/Time   NA 140 12/18/2013 0639   K 4.0 12/18/2013 0639   CL 97 12/18/2013 0639   CO2 31 12/18/2013 0639   GLUCOSE 134* 12/18/2013 0639   BUN 16 12/18/2013 0639   CREATININE 1.59* 12/18/2013 0639   CALCIUM 8.1* 12/18/2013 0639   PROT 6.9 12/08/2013 1013   ALBUMIN 3.7 12/08/2013 1013   AST 14 12/08/2013 1013   ALT 11 12/08/2013 1013   ALKPHOS 66 12/08/2013 1013   BILITOT 0.7 12/08/2013 1013   GFRNONAA 41* 12/18/2013 0639   GFRAA 48* 12/18/2013 0639        Component Value Date/Time   WBC 6.0 12/16/2013 0657   WBC 5.3 12/15/2013 0543   WBC 6.0 12/14/2013 0542   HGB 11.3* 12/16/2013 0657   HGB 9.8* 12/15/2013 0543   HGB 10.1* 12/14/2013 0542   HCT 33.9* 12/16/2013 0657   HCT 29.0* 12/15/2013 0543   HCT 29.9* 12/14/2013 0542   MCV 92.9 12/16/2013 0657   MCV 92.9 12/15/2013 0543   MCV 92.0 12/14/2013 0542    Lipid Panel     Component Value Date/Time   CHOL 194 06/29/2013 1128   TRIG 91 06/29/2013 1128   HDL 50 06/29/2013 1128   CHOLHDL 3.9 06/29/2013 1128   VLDL 18 06/29/2013 1128   LDLCALC 126* 06/29/2013 1128    ABG No results found for this basename: phart, pco2, pco2art, po2, po2art, hco3, tco2, acidbasedef, o2sat     Lab Results  Component Value Date   TSH 0.943 02/06/2012   BNP (last 3 results) No results found for this basename: PROBNP,  in the last 8760 hours Cardiac Panel (last 3 results) No results found for this basename: CKTOTAL, CKMB, TROPONINI, RELINDX,  in the last 72 hours  Iron/TIBC/Ferritin/ %Sat No results found for this basename: iron, tibc, ferritin, ironpctsat     EKG Orders placed in visit on 06/15/13  . EKG 12-LEAD     Prior Assessment and Plan Problem List as of 07/02/2014     Cardiovascular and Mediastinum   Systolic CHF, chronic   Last Assessment & Plan   01/01/2014 Office Visit Written 01/01/2014  2:57 PM by Lendon Colonel, NP     No evidence of fluid overload at this time. In fact he appears mildly dehydrated. I will decrease his Lasix to 40 mg daily with incentive 40 mg twice a day. He will have a CBC and a BMET to evaluate for kidney function and poor anemia postoperatively. He will be seen on followup in a month with either [name] myself for ongoing assessment.    Atrial fibrillation   Last Assessment & Plan   06/15/2013 Office Visit Written 06/15/2013  9:08 AM by [name], MD     Presumably paroxysmal, in sinus rhythm today by ECG. No palpitations. Continue Coumadin. He is being established  in the Coumadin clinic.    Coronary atherosclerosis of native coronary artery   Last Assessment & Plan  01/01/2014 Office Visit Written 01/01/2014  2:59 PM by Lendon Colonel, NP     He is without cardiac complaints at this time. No recurrent chest pain, some shortness of breath related to deconditioning. His recent echocardiogram in September 2004 2 revealed normal LV systolic function. As stated will decrease Lasix dose. This will help with some mild dizziness. He did have orthostatic blood pressures completed which were negative.    Aortic stenosis   Last Assessment & Plan   06/15/2013 Office Visit Written 06/15/2013  9:09 AM by [name], MD     Mild to moderate by prior assessment.    Mitral regurgitation   Last Assessment & Plan   06/15/2013 Office Visit Written 06/15/2013  9:09 AM by [name], MD     Moderate prior assessment.      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 He is being followed by Dr. [name] in [place].      Genitourinary   CKD (chronic kidney disease) stage 3, GFR 30-59 ml/min   Last Assessment & Plan   06/15/2013 Office Visit Written 06/15/2013  9:10 AM by [name], MD     Presumably reason that he has not been on ACE inhibitor, also supported by discussion with patient and son today. Need to keep a close eye on blood pressure with primary care provider. Other agents could be chosen.      Other   Long term (current) use of anticoagulants   Protein-calorie malnutrition, severe   Encounter for therapeutic drug monitoring       Imaging: No results found.

## 2014-07-04 ENCOUNTER — Ambulatory Visit (HOSPITAL_COMMUNITY)
Admission: RE | Admit: 2014-07-04 | Discharge: 2014-07-04 | Disposition: A | Discharge status: Home / Self Care | Payer: Medicare Other | Source: Ambulatory Visit | Attending: Adult Health | Admitting: Adult Health

## 2014-07-04 DIAGNOSIS — I2510 Atherosclerotic heart disease of native coronary artery without angina pectoris: Secondary | ICD-10-CM | POA: Insufficient documentation

## 2014-07-04 DIAGNOSIS — J4489 Other specified chronic obstructive pulmonary disease: Secondary | ICD-10-CM | POA: Insufficient documentation

## 2014-07-04 DIAGNOSIS — I059 Rheumatic mitral valve disease, unspecified: Secondary | ICD-10-CM | POA: Insufficient documentation

## 2014-07-04 DIAGNOSIS — J449 Chronic obstructive pulmonary disease, unspecified: Secondary | ICD-10-CM | POA: Diagnosis not present

## 2014-07-04 DIAGNOSIS — I4891 Unspecified atrial fibrillation: Secondary | ICD-10-CM | POA: Diagnosis present

## 2014-07-04 DIAGNOSIS — I350 Nonrheumatic aortic (valve) stenosis: Secondary | ICD-10-CM

## 2014-07-04 NOTE — Progress Notes (Signed)
  Echocardiogram 2D Echocardiogram has been performed.  [place], [place] 07/04/2014, 11:27 AM

## 2014-07-16 ENCOUNTER — Ambulatory Visit (INDEPENDENT_AMBULATORY_CARE_PROVIDER_SITE_OTHER): Payer: Medicare Other | Admitting: Pharmacist

## 2014-07-16 DIAGNOSIS — Z7901 Long term (current) use of anticoagulants: Secondary | ICD-10-CM

## 2014-07-16 DIAGNOSIS — I4891 Unspecified atrial fibrillation: Secondary | ICD-10-CM

## 2014-07-16 DIAGNOSIS — Z5181 Encounter for therapeutic drug level monitoring: Secondary | ICD-10-CM

## 2014-07-16 LAB — POCT INR: INR: 2

## 2014-08-15 ENCOUNTER — Ambulatory Visit (INDEPENDENT_AMBULATORY_CARE_PROVIDER_SITE_OTHER): Payer: Medicare Other | Admitting: *Deleted

## 2014-08-15 DIAGNOSIS — Z7901 Long term (current) use of anticoagulants: Secondary | ICD-10-CM

## 2014-08-15 DIAGNOSIS — I4891 Unspecified atrial fibrillation: Secondary | ICD-10-CM

## 2014-08-15 DIAGNOSIS — Z5181 Encounter for therapeutic drug level monitoring: Secondary | ICD-10-CM

## 2014-08-15 LAB — POCT INR: INR: 3.3

## 2014-08-23 ENCOUNTER — Other Ambulatory Visit: Payer: Self-pay | Admitting: Cardiology

## 2014-08-23 MED ORDER — FUROSEMIDE 40 MG PO TABS: 40.0000 mg | ORAL_TABLET | Freq: Every day | ORAL | Status: DC

## 2014-08-23 NOTE — Telephone Encounter (Signed)
Lasix should be 40 mg daily. Not 80 mg. Refill for 40 mg daily. Can have 90 day supply if it is helpful

## 2014-08-23 NOTE — Telephone Encounter (Signed)
Refill lasix 40 mg qd sent

## 2014-08-23 NOTE — Telephone Encounter (Signed)
Please clarify dose refill pharmacy requests lasix 80 mg 1 1/2 twice a day,last OV states 40 mg daily

## 2014-09-18 ENCOUNTER — Other Ambulatory Visit: Payer: Self-pay | Admitting: Adult Health

## 2014-09-19 ENCOUNTER — Ambulatory Visit (INDEPENDENT_AMBULATORY_CARE_PROVIDER_SITE_OTHER): Payer: Medicare Other | Admitting: *Deleted

## 2014-09-19 DIAGNOSIS — Z5181 Encounter for therapeutic drug level monitoring: Secondary | ICD-10-CM

## 2014-09-19 DIAGNOSIS — I4891 Unspecified atrial fibrillation: Secondary | ICD-10-CM

## 2014-09-19 DIAGNOSIS — Z7901 Long term (current) use of anticoagulants: Secondary | ICD-10-CM

## 2014-09-19 LAB — POCT INR: INR: 3.2

## 2014-10-02 ENCOUNTER — Encounter (HOSPITAL_COMMUNITY): Payer: Self-pay | Admitting: Emergency Medicine

## 2014-10-02 ENCOUNTER — Inpatient Hospital Stay (HOSPITAL_COMMUNITY)
Admission: EM | Admit: 2014-10-02 | Discharge: 2014-10-05 | DRG: 308 | Disposition: A | Discharge status: Home / Self Care | Payer: Medicare Other | Attending: Internal Medicine | Admitting: Internal Medicine

## 2014-10-02 ENCOUNTER — Emergency Department (HOSPITAL_COMMUNITY): Payer: Medicare Other

## 2014-10-02 DIAGNOSIS — J441 Chronic obstructive pulmonary disease with (acute) exacerbation: Secondary | ICD-10-CM | POA: Diagnosis present

## 2014-10-02 DIAGNOSIS — R059 Cough, unspecified: Secondary | ICD-10-CM

## 2014-10-02 DIAGNOSIS — Z9049 Acquired absence of other specified parts of digestive tract: Secondary | ICD-10-CM | POA: Diagnosis present

## 2014-10-02 DIAGNOSIS — I509 Heart failure, unspecified: Secondary | ICD-10-CM

## 2014-10-02 DIAGNOSIS — J9801 Acute bronchospasm: Secondary | ICD-10-CM | POA: Diagnosis present

## 2014-10-02 DIAGNOSIS — Z9114 Patient's other noncompliance with medication regimen: Secondary | ICD-10-CM | POA: Diagnosis present

## 2014-10-02 DIAGNOSIS — N183 Chronic kidney disease, stage 3 unspecified: Secondary | ICD-10-CM | POA: Diagnosis present

## 2014-10-02 DIAGNOSIS — I2510 Atherosclerotic heart disease of native coronary artery without angina pectoris: Secondary | ICD-10-CM | POA: Diagnosis present

## 2014-10-02 DIAGNOSIS — K219 Gastro-esophageal reflux disease without esophagitis: Secondary | ICD-10-CM | POA: Diagnosis present

## 2014-10-02 DIAGNOSIS — E039 Hypothyroidism, unspecified: Secondary | ICD-10-CM | POA: Diagnosis present

## 2014-10-02 DIAGNOSIS — R0602 Shortness of breath: Secondary | ICD-10-CM

## 2014-10-02 DIAGNOSIS — I5022 Chronic systolic (congestive) heart failure: Secondary | ICD-10-CM | POA: Diagnosis present

## 2014-10-02 DIAGNOSIS — I5041 Acute combined systolic (congestive) and diastolic (congestive) heart failure: Secondary | ICD-10-CM

## 2014-10-02 DIAGNOSIS — Z85038 Personal history of other malignant neoplasm of large intestine: Secondary | ICD-10-CM | POA: Diagnosis not present

## 2014-10-02 DIAGNOSIS — I4891 Unspecified atrial fibrillation: Secondary | ICD-10-CM

## 2014-10-02 DIAGNOSIS — I255 Ischemic cardiomyopathy: Secondary | ICD-10-CM | POA: Diagnosis present

## 2014-10-02 DIAGNOSIS — Z8249 Family history of ischemic heart disease and other diseases of the circulatory system: Secondary | ICD-10-CM | POA: Diagnosis not present

## 2014-10-02 DIAGNOSIS — I252 Old myocardial infarction: Secondary | ICD-10-CM | POA: Diagnosis not present

## 2014-10-02 DIAGNOSIS — Z951 Presence of aortocoronary bypass graft: Secondary | ICD-10-CM

## 2014-10-02 DIAGNOSIS — Z9111 Patient's noncompliance with dietary regimen: Secondary | ICD-10-CM | POA: Diagnosis present

## 2014-10-02 DIAGNOSIS — R0600 Dyspnea, unspecified: Secondary | ICD-10-CM | POA: Diagnosis present

## 2014-10-02 DIAGNOSIS — D696 Thrombocytopenia, unspecified: Secondary | ICD-10-CM | POA: Diagnosis present

## 2014-10-02 DIAGNOSIS — I480 Paroxysmal atrial fibrillation: Secondary | ICD-10-CM | POA: Diagnosis present

## 2014-10-02 DIAGNOSIS — I5043 Acute on chronic combined systolic (congestive) and diastolic (congestive) heart failure: Secondary | ICD-10-CM | POA: Diagnosis present

## 2014-10-02 DIAGNOSIS — R05 Cough: Secondary | ICD-10-CM

## 2014-10-02 DIAGNOSIS — Z87891 Personal history of nicotine dependence: Secondary | ICD-10-CM | POA: Diagnosis not present

## 2014-10-02 DIAGNOSIS — Z7901 Long term (current) use of anticoagulants: Secondary | ICD-10-CM

## 2014-10-02 DIAGNOSIS — I350 Nonrheumatic aortic (valve) stenosis: Secondary | ICD-10-CM | POA: Diagnosis present

## 2014-10-02 LAB — CBC WITH DIFFERENTIAL/PLATELET
BASOS PCT: 0 % (ref 0–1)
Basophils Absolute: 0 10*3/uL (ref 0.0–0.1)
EOS ABS: 0.1 10*3/uL (ref 0.0–0.7)
EOS PCT: 1 % (ref 0–5)
HCT: 39.2 % (ref 39.0–52.0)
Hemoglobin: 12.1 g/dL — ABNORMAL LOW (ref 13.0–17.0)
Lymphocytes Relative: 10 % — ABNORMAL LOW (ref 12–46)
Lymphs Abs: 0.9 10*3/uL (ref 0.7–4.0)
MCH: 30 pg (ref 26.0–34.0)
MCHC: 30.9 g/dL (ref 30.0–36.0)
MCV: 97 fL (ref 78.0–100.0)
Monocytes Absolute: 0.9 10*3/uL (ref 0.1–1.0)
Monocytes Relative: 11 % (ref 3–12)
NEUTROS PCT: 79 % — AB (ref 43–77)
Neutro Abs: 6.9 10*3/uL (ref 1.7–7.7)
PLATELETS: 88 10*3/uL — AB (ref 150–400)
RBC: 4.04 MIL/uL — ABNORMAL LOW (ref 4.22–5.81)
RDW: 16.2 % — ABNORMAL HIGH (ref 11.5–15.5)
WBC: 8.8 10*3/uL (ref 4.0–10.5)

## 2014-10-02 LAB — BASIC METABOLIC PANEL
Anion gap: 7 (ref 5–15)
BUN: 36 mg/dL — ABNORMAL HIGH (ref 6–23)
CALCIUM: 8.6 mg/dL (ref 8.4–10.5)
CO2: 28 mmol/L (ref 19–32)
Chloride: 105 mEq/L (ref 96–112)
Creatinine, Ser: 1.64 mg/dL — ABNORMAL HIGH (ref 0.50–1.35)
GFR calc non Af Amer: 39 mL/min — ABNORMAL LOW (ref >90)
GFR, EST AFRICAN AMERICAN: 46 mL/min — AB (ref >90)
Glucose, Bld: 162 mg/dL — ABNORMAL HIGH (ref 70–99)
POTASSIUM: 4 mmol/L (ref 3.5–5.1)
SODIUM: 140 mmol/L (ref 135–145)

## 2014-10-02 LAB — BRAIN NATRIURETIC PEPTIDE: B Natriuretic Peptide: 395 pg/mL — ABNORMAL HIGH (ref 0.0–100.0)

## 2014-10-02 LAB — MRSA PCR SCREENING: MRSA by PCR: NEGATIVE

## 2014-10-02 LAB — TROPONIN I
TROPONIN I: 0.06 ng/mL — AB (ref <0.031)
Troponin I: 0.06 ng/mL — ABNORMAL HIGH (ref <0.031)
Troponin I: 0.08 ng/mL — ABNORMAL HIGH (ref <0.031)

## 2014-10-02 LAB — PROTIME-INR
INR: 3.47 — ABNORMAL HIGH (ref 0.00–1.49)
PROTHROMBIN TIME: 35.2 s — AB (ref 11.6–15.2)

## 2014-10-02 LAB — LACTIC ACID, PLASMA: Lactic Acid, Venous: 1.8 mmol/L (ref 0.5–2.2)

## 2014-10-02 MED ORDER — SODIUM CHLORIDE 0.9 % IJ SOLN
3.0000 mL | Freq: Two times a day (BID) | INTRAMUSCULAR | Status: DC
  Administered 2014-10-03 – 2014-10-05 (×3): 3 mL via INTRAVENOUS

## 2014-10-02 MED ORDER — LOPERAMIDE HCL 2 MG PO CAPS: 2.0000 mg | ORAL_CAPSULE | ORAL | Status: DC | PRN

## 2014-10-02 MED ORDER — FUROSEMIDE 10 MG/ML IJ SOLN
80.0000 mg | Freq: Once | INTRAMUSCULAR | Status: DC
  Filled 2014-10-02: qty 8

## 2014-10-02 MED ORDER — SODIUM CHLORIDE 0.9 % IJ SOLN
3.0000 mL | Freq: Two times a day (BID) | INTRAMUSCULAR | Status: DC
  Administered 2014-10-04 (×2): 3 mL via INTRAVENOUS

## 2014-10-02 MED ORDER — SODIUM CHLORIDE 0.9 % IV SOLN: INTRAVENOUS | Status: DC

## 2014-10-02 MED ORDER — IPRATROPIUM-ALBUTEROL 0.5-2.5 (3) MG/3ML IN SOLN
3.0000 mL | RESPIRATORY_TRACT | Status: DC | PRN
  Administered 2014-10-03 (×2): 3 mL via RESPIRATORY_TRACT
  Filled 2014-10-02 (×2): qty 3

## 2014-10-02 MED ORDER — DILTIAZEM HCL 100 MG IV SOLR
5.0000 mg/h | INTRAVENOUS | Status: DC
  Administered 2014-10-03: 5 mg/h via INTRAVENOUS
  Filled 2014-10-02: qty 100

## 2014-10-02 MED ORDER — SODIUM CHLORIDE 0.9 % IV SOLN
INTRAVENOUS | Status: DC
  Administered 2014-10-02: 1000 mL via INTRAVENOUS

## 2014-10-02 MED ORDER — DEXTROSE 5 % IV SOLN
5.0000 mg/h | Freq: Once | INTRAVENOUS | Status: AC
  Administered 2014-10-02: 5 mg/h via INTRAVENOUS
  Filled 2014-10-02: qty 100

## 2014-10-02 MED ORDER — CARVEDILOL 3.125 MG PO TABS
6.2500 mg | ORAL_TABLET | Freq: Two times a day (BID) | ORAL | Status: DC
  Administered 2014-10-02 – 2014-10-03 (×2): 6.25 mg via ORAL
  Filled 2014-10-02 (×2): qty 1
  Filled 2014-10-02: qty 2
  Filled 2014-10-02 (×2): qty 1
  Filled 2014-10-02: qty 2

## 2014-10-02 MED ORDER — WARFARIN SODIUM 5 MG PO TABS: 2.5000 mg | ORAL_TABLET | ORAL | Status: DC

## 2014-10-02 MED ORDER — ONDANSETRON HCL 4 MG/2ML IJ SOLN: 4.0000 mg | Freq: Four times a day (QID) | INTRAMUSCULAR | Status: DC | PRN

## 2014-10-02 MED ORDER — DILTIAZEM HCL 25 MG/5ML IV SOLN
10.0000 mg | Freq: Once | INTRAVENOUS | Status: AC
  Administered 2014-10-02: 10 mg via INTRAVENOUS

## 2014-10-02 MED ORDER — ONDANSETRON HCL 4 MG PO TABS: 4.0000 mg | ORAL_TABLET | Freq: Four times a day (QID) | ORAL | Status: DC | PRN

## 2014-10-02 MED ORDER — FUROSEMIDE 10 MG/ML IJ SOLN
40.0000 mg | Freq: Two times a day (BID) | INTRAMUSCULAR | Status: DC
  Filled 2014-10-02: qty 4

## 2014-10-02 MED ORDER — ALPRAZOLAM 1 MG PO TABS
1.0000 mg | ORAL_TABLET | ORAL | Status: DC | PRN
  Administered 2014-10-03: 0.5 mg via ORAL
  Filled 2014-10-02: qty 2

## 2014-10-02 MED ORDER — FUROSEMIDE 10 MG/ML IJ SOLN
80.0000 mg | Freq: Once | INTRAMUSCULAR | Status: AC
  Administered 2014-10-02: 80 mg via INTRAVENOUS

## 2014-10-02 MED ORDER — POTASSIUM CHLORIDE CRYS ER 20 MEQ PO TBCR
20.0000 meq | EXTENDED_RELEASE_TABLET | Freq: Every day | ORAL | Status: DC
  Administered 2014-10-02 – 2014-10-05 (×5): 20 meq via ORAL
  Filled 2014-10-02 (×4): qty 1

## 2014-10-02 MED ORDER — DILTIAZEM LOAD VIA INFUSION
10.0000 mg | Freq: Once | INTRAVENOUS | Status: DC
  Filled 2014-10-02: qty 10

## 2014-10-02 MED ORDER — FUROSEMIDE 10 MG/ML IJ SOLN
40.0000 mg | Freq: Two times a day (BID) | INTRAMUSCULAR | Status: DC
  Administered 2014-10-03 – 2014-10-05 (×5): 40 mg via INTRAVENOUS
  Filled 2014-10-02 (×5): qty 4

## 2014-10-02 MED ORDER — DILTIAZEM HCL 25 MG/5ML IV SOLN
10.0000 mg | Freq: Once | INTRAVENOUS | Status: AC
  Administered 2014-10-02: 10 mg via INTRAVENOUS
  Filled 2014-10-02: qty 5

## 2014-10-02 MED ORDER — HYDROCODONE-ACETAMINOPHEN 5-325 MG PO TABS: 1.0000 | ORAL_TABLET | ORAL | Status: DC | PRN

## 2014-10-02 NOTE — ED Provider Notes (Signed)
CSN: [id number]     Arrival date & time 10/02/14  1331 History  This chart was scribed for Fredia Sorrow, MD by Zola Button, ED Scribe. This patient was seen in room APA02/[facility]02 and the patient's care was started at 2:07 PM.       Chief Complaint  Patient presents with  . Shortness of Breath    Patient is a 73 y.o. male presenting with shortness of breath. The history is provided by the patient. No language interpreter was used.  Shortness of Breath Onset quality:  Gradual Duration:  2 weeks Relieved by:  Nothing Worsened by:  Nothing tried Associated symptoms: abdominal pain, cough and wheezing   Associated symptoms: no chest pain, no fever, no headaches, no rash, no sore throat and no vomiting     HPI Comments: Cory Ryan is a 73 y.o. male with a hx of A Fib who presents to the Emergency Department complaining of SOB that began 2 weeks ago. He also reports having productive cough of white phlegm, abdominal pain and wheezing. Patient saw his PCP 5 days ago and was given steroid shots. He denies fever and chest pain. He is not on O2 at home, but he does use an inhaler and is on coumadin.    Past Medical History  Diagnosis Date  . Atrial fibrillation   . Chronic systolic heart failure     LVEF 40-45%  . Emphysema   . Coronary atherosclerosis of native coronary artery     Multivessel status post CABG  . Gastric erosions 12/2005    Per EGD - Dr. [name]  . Colon polyps 12/2005    Per colonoscopy  . Ischemic cardiomyopathy   . Aortic stenosis     Moderate  . Mitral regurgitation     Moderate  . Pulmonary nodules   . Hypothyroidism   . Psoriasis   . Myocardial infarction     Remote  . CKD (chronic kidney disease) stage 3, GFR 30-59 ml/min   . Anemia   . Colon cancer   . Atrial fibrillation   . CHF (congestive heart failure)   . Anxiety   . Shortness of breath     exertion  . GERD (gastroesophageal reflux disease)    Past Surgical History  Procedure Laterality Date   . Coronary artery bypass graft  March 2004    LIMA to LAD, SVG to diagonal, SVG to OM 2, SVG to RCA  . Flexible sigmoidoscopy  10/25/2012    Procedure: FLEXIBLE SIGMOIDOSCOPY;  Surgeon: [name], MD;  Location: [facility];  Service: Gastroenterology;  Laterality: N/A;  . Partial colectomy  10/26/2012    Procedure: PARTIAL COLECTOMY;  Surgeon: [name], MD;  Location: [facility];  Service: General;  Laterality: N/A;  Closure of Colovesical Fistula  . Colectomy N/A 12/13/2013    Procedure: TOTAL COLECTOMY;  Surgeon: [name], MD;  Location: [facility];  Service: General;  Laterality: N/A;   Family History  Problem Relation Age of Onset  . Cancer Brother   . Hypertension Other    History  Substance Use Topics  . Smoking status: Former Smoker -- 1.00 packs/day for 30 years    Types: Cigarettes    Quit date: 03/17/2003  . Smokeless tobacco: Never Used  . Alcohol Use: No     Comment: no alcohol use since '04    Review of Systems  Constitutional: Negative for fever and chills.  HENT: Negative for congestion, rhinorrhea and sore  throat.   Eyes: Negative for visual disturbance.  Respiratory: Positive for cough, shortness of breath and wheezing.   Cardiovascular: Negative for chest pain and leg swelling.  Gastrointestinal: Positive for abdominal pain. Negative for nausea, vomiting and diarrhea.  Genitourinary: Negative for dysuria.  Musculoskeletal: Negative for back pain.  Skin: Negative for rash.  Neurological: Positive for light-headedness. Negative for dizziness and headaches.  Hematological: Bruises/bleeds easily.      Allergies  Review of patient's allergies indicates no known allergies.  Home Medications   Prior to Admission medications   Medication Sig Start Date End Date Taking? Authorizing Provider  ALPRAZolam Duanne Moron) 1 MG tablet Take 1 mg by mouth as needed for anxiety.  10/28/12  Yes Historical Provider, MD  carvedilol (COREG) 6.25 MG tablet TAKE (1) TABLET  BY MOUTH TWICE DAILY WITH A MEAL. 09/18/14  Yes Lendon Colonel, NP  Dextromethorphan-Guaifenesin 60-1200 MG per 12 hr tablet Take 1 tablet by mouth every 12 (twelve) hours. 07/02/14  Yes Lendon Colonel, NP  furosemide (LASIX) 40 MG tablet Take 1 tablet (40 mg total) by mouth daily. 08/23/14  Yes Lendon Colonel, NP  HYDROcodone-acetaminophen (NORCO) 5-325 MG per tablet Take 1-2 tablets by mouth every 4 (four) hours as needed. Patient taking differently: Take 1 tablet by mouth every 6 (six) hours as needed for moderate pain.  12/18/13  Yes [name], MD  Ipratropium-Albuterol (COMBIVENT RESPIMAT) 20-100 MCG/ACT AERS respimat Inhale 1 puff into the lungs as needed. Patient taking differently: Inhale 1 puff into the lungs as needed for wheezing or shortness of breath.  06/15/13  Yes [name], MD  loperamide (IMODIUM) 2 MG capsule Take 1 capsule (2 mg total) by mouth as needed for diarrhea or loose stools. 10/29/12  Yes Donato Heinz, MD  Magnesium Hydroxide (MILK OF MAGNESIA PO) Take 30 mLs by mouth as needed (stomach pain).    Yes Historical Provider, MD  potassium chloride SA (K-DUR,KLOR-CON) 20 MEQ tablet Take 20 mEq by mouth.  02/08/12  Yes Rexene Alberts, MD  warfarin (COUMADIN) 5 MG tablet Take 1 tablet daily except 1/2 tablet on Sundays, Tuesdays and Thursdays Patient taking differently: Take 2.5-5 mg by mouth See admin instructions. Take 1 tablet daily except 1/2 tablet on Sundays, Tuesdays and Thursdays 04/16/14  Yes [name], MD   BP 119/67 mmHg  Pulse 103  Temp(Src) 97.9 F (36.6 C) (Oral)  Resp 18  Ht 5\' 10"  (1.778 m)  Wt 170 lb (77.111 kg)  BMI 24.39 kg/m2  SpO2 97% Physical Exam  Constitutional: He is oriented to person, place, and time. He appears well-developed and well-nourished. No distress.  HENT:  Head: Normocephalic and atraumatic.  Mouth/Throat: Oropharynx is clear and moist. No oropharyngeal exudate.  Eyes: EOM are normal. Pupils are equal,  round, and reactive to light.  Neck: Neck supple.  Cardiovascular: An irregular rhythm present. Tachycardia present.   No murmur heard. Pulmonary/Chest: Effort normal. He has wheezes. He has rales.  Bilateral rales, hint of wheezing.  Abdominal: Soft. Bowel sounds are normal. He exhibits distension. There is no tenderness.  Mildly distended.  Musculoskeletal: He exhibits no edema.  Neurological: He is alert and oriented to person, place, and time. No cranial nerve deficit.  Skin: Skin is warm and dry. No rash noted.  Psychiatric: He has a normal mood and affect. His behavior is normal.  Nursing note and vitals reviewed.   ED Course  Procedures  DIAGNOSTIC STUDIES: Oxygen Saturation is 97%  on [place], normal by my interpretation.    COORDINATION OF CARE: 2:14 PM-Discussed treatment plan which includes labs and CXR with pt at bedside and pt agreed to plan.   Labs Review Labs Reviewed  CBC WITH DIFFERENTIAL - Abnormal; Notable for the following:    RBC 4.04 (*)    Hemoglobin 12.1 (*)    RDW 16.2 (*)    Platelets 88 (*)    Neutrophils Relative % 79 (*)    Lymphocytes Relative 10 (*)    All other components within normal limits  BASIC METABOLIC PANEL - Abnormal; Notable for the following:    Glucose, Bld 162 (*)    BUN 36 (*)    Creatinine, Ser 1.64 (*)    GFR calc non Af Amer 39 (*)    GFR calc Af Amer 46 (*)    All other components within normal limits  TROPONIN I - Abnormal; Notable for the following:    Troponin I 0.06 (*)    All other components within normal limits  PROTIME-INR - Abnormal; Notable for the following:    Prothrombin Time 35.2 (*)    INR 3.47 (*)    All other components within normal limits  CULTURE, BLOOD (ROUTINE X 2)  CULTURE, BLOOD (ROUTINE X 2)  LACTIC ACID, PLASMA  BRAIN NATRIURETIC PEPTIDE  TSH  TROPONIN I  TROPONIN I  TROPONIN I  COMPREHENSIVE METABOLIC PANEL  CBC  PROTIME-INR    Imaging Review Dg Chest Portable 1 View  10/02/2014    CLINICAL DATA:  Shortness of breath.  EXAM: PORTABLE CHEST - 1 VIEW  COMPARISON:  02/06/2012  FINDINGS: Patient is post median sternotomy. Lower lung volumes from prior. The heart is mildly enlarged. There is vascular congestion and questionable mild pulmonary edema. Increased right infrahilar markings, with nodular density in the right lower lung zone. No large pleural effusion. An azygos fissure is again noted. The osseous structures are grossly intact.  IMPRESSION: 1. Findings suggest congestive heart failure. 2. Nodular density in the right lower lung zone, nonspecific. This may reflect a nipple shadow, vessel on-end, versus true pulmonary nodule. Follow up PA and lateral views recommended with nipple markers.   Electronically Signed   By: Jeb Levering M.D.   On: 10/02/2014 14:41     EKG Interpretation   Date/Time:  Tuesday October 02 2014 13:44:25 EST Ventricular Rate:  164 PR Interval:    QRS Duration: 86 QT Interval:  245 QTC Calculation: 405 R Axis:   54 Text Interpretation:  Atrial fibrillation with rapid V-rate Repolarization  abnormality, prob rate related Baseline wander in lead(s) V3 Confirmed by  Risha Barretta  MD, Whitleigh Garramone [phone]) on 10/02/2014 5:18:54 PM      CRITICAL CARE Performed by: Fredia Sorrow Total critical care time: 30 Critical care time was exclusive of separately billable procedures and treating other patients. Critical care was necessary to treat or prevent imminent or life-threatening deterioration. Critical care was time spent personally by me on the following activities: development of treatment plan with patient and/or surrogate as well as nursing, discussions with consultants, evaluation of patient's response to treatment, examination of patient, obtaining history from patient or surrogate, ordering and performing treatments and interventions, ordering and review of laboratory studies, ordering and review of radiographic studies, pulse oximetry and  re-evaluation of patient's condition.    MDM   Final diagnoses:  Atrial fibrillation with rapid ventricular response  Acute on chronic congestive heart failure, unspecified congestive heart failure type   patient presenting by  EMS with a complaint of shortness of breath for 2 weeks. Patient was seen by primary care provider one week ago treated with steroids as if it was COPD problem. Patient noted here to be in rapid ventricular rate atrial fibrillation. Does have a history of that and he is on Coumadin for that.  Chest x-ray consistent with the pulmonary edema congestive heart failure. Most likely secondary to being in the rapid heart rate for a period of time. Patient treated with a bolus of diltiazem 10 mg and then a repeat at 10 mg so total of 20 mg. And started on drip to control the atrial fibrillation. Patient also given Lasix 80 mg IV. Patient's blood pressure has not been sniffing the elevated no nitroglycerin use. In addition patient's troponin was slightly elevated most likely due to the atrial fibrillation. Follow-up troponins to make sure that is not increasing and being consistent with a non-STEMI MI. Patient will be admitted to step down.    I personally performed the services described in this documentation, which was scribed in my presence. The recorded information has been reviewed and is accurate.     Fredia Sorrow, MD 10/02/14 2151

## 2014-10-02 NOTE — ED Notes (Signed)
Pt reports increasing SOB since 2 weeks ago. Pt seen by PCP 1 week ago. Placed on abx, steroids.

## 2014-10-02 NOTE — H&P (Signed)
[facility] History and Physical  Cory Ryan XBL:[id number] DOB: 1941-08-29 DOA: 10/02/2014  Referring physician: ER PCP: Collene Mares, PA-C   Chief Complaint: Dyspnea  HPI: Cory Ryan is a 73 y.o. male  This is a 49 old man who gives a two-week history of increasing dyspnea. He denies any chest pain. There has been a cough productive of whitish sputum. There has been no fever. He was seen by his primary care physician 5 days ago and was given steroids. He does have a history of atrial fibrillation, on chronic Coumadin therapy as well as a history of systolic heart failure with ejection fraction of 40-45% and moderate aortic stenosis on his last echocardiogram in September of this year. He does have a history of coronary artery disease, status post CABG in the past. He denies any ankle swelling. Evaluation in the emergency room shows him to be in atrial fibrillation with rapid ventricle response and also in congestive heart failure. He is now being admitted for further management.   Review of Systems:  Apart from the symptoms mentioned above, all other systems negative.  Past Medical History  Diagnosis Date  . Atrial fibrillation   . Chronic systolic heart failure     LVEF 40-45%  . Emphysema   . Coronary atherosclerosis of native coronary artery     Multivessel status post CABG  . Gastric erosions 12/2005    Per EGD - Dr. [name]  . Colon polyps 12/2005    Per colonoscopy  . Ischemic cardiomyopathy   . Aortic stenosis     Moderate  . Mitral regurgitation     Moderate  . Pulmonary nodules   . Hypothyroidism   . Psoriasis   . Myocardial infarction     Remote  . CKD (chronic kidney disease) stage 3, GFR 30-59 ml/min   . Anemia   . Colon cancer   . Atrial fibrillation   . CHF (congestive heart failure)   . Anxiety   . Shortness of breath     exertion  . GERD (gastroesophageal reflux disease)    Past Surgical History  Procedure Laterality Date  . Coronary  artery bypass graft  March 2004    LIMA to LAD, SVG to diagonal, SVG to OM 2, SVG to RCA  . Flexible sigmoidoscopy  10/25/2012    Procedure: FLEXIBLE SIGMOIDOSCOPY;  Surgeon: [name], MD;  Location: [facility];  Service: Gastroenterology;  Laterality: N/A;  . Partial colectomy  10/26/2012    Procedure: PARTIAL COLECTOMY;  Surgeon: [name], MD;  Location: [facility];  Service: General;  Laterality: N/A;  Closure of Colovesical Fistula  . Colectomy N/A 12/13/2013    Procedure: TOTAL COLECTOMY;  Surgeon: [name], MD;  Location: [facility];  Service: General;  Laterality: N/A;   Social History:  reports that he quit smoking about 11 years ago. His smoking use included Cigarettes. He has a 30 pack-year smoking history. He has never used smokeless tobacco. He reports that he does not drink alcohol or use illicit drugs.  No Known Allergies  Family History  Problem Relation Age of Onset  . Cancer Brother   . Hypertension Other      Prior to Admission medications   Medication Sig Start Date End Date Taking? Authorizing Provider  ALPRAZolam Duanne Moron) 1 MG tablet Take 1 mg by mouth as needed for anxiety.  10/28/12  Yes Historical Provider, MD  carvedilol (COREG) 6.25 MG tablet TAKE (1) TABLET BY MOUTH  TWICE DAILY WITH A MEAL. 09/18/14  Yes Lendon Colonel, NP  Dextromethorphan-Guaifenesin 60-1200 MG per 12 hr tablet Take 1 tablet by mouth every 12 (twelve) hours. 07/02/14  Yes Lendon Colonel, NP  furosemide (LASIX) 40 MG tablet Take 1 tablet (40 mg total) by mouth daily. 08/23/14  Yes Lendon Colonel, NP  HYDROcodone-acetaminophen (NORCO) 5-325 MG per tablet Take 1-2 tablets by mouth every 4 (four) hours as needed. Patient taking differently: Take 1 tablet by mouth every 6 (six) hours as needed for moderate pain.  12/18/13  Yes [name], MD  Ipratropium-Albuterol (COMBIVENT RESPIMAT) 20-100 MCG/ACT AERS respimat Inhale 1 puff into the lungs as needed. Patient taking differently:  Inhale 1 puff into the lungs as needed for wheezing or shortness of breath.  06/15/13  Yes [name], MD  loperamide (IMODIUM) 2 MG capsule Take 1 capsule (2 mg total) by mouth as needed for diarrhea or loose stools. 10/29/12  Yes Donato Heinz, MD  Magnesium Hydroxide (MILK OF MAGNESIA PO) Take 30 mLs by mouth as needed (stomach pain).    Yes Historical Provider, MD  potassium chloride SA (K-DUR,KLOR-CON) 20 MEQ tablet Take 20 mEq by mouth.  02/08/12  Yes Rexene Alberts, MD  warfarin (COUMADIN) 5 MG tablet Take 1 tablet daily except 1/2 tablet on Sundays, Tuesdays and Thursdays Patient taking differently: Take 2.5-5 mg by mouth See admin instructions. Take 1 tablet daily except 1/2 tablet on Sundays, Tuesdays and Thursdays 04/16/14  Yes [name], MD   Physical Exam: Filed Vitals:   10/02/14 1516 10/02/14 1536 10/02/14 1641 10/02/14 1710  BP: 112/71 115/60 130/80 119/67  Pulse: 121 100 104 103  Temp:      TempSrc:      Resp: 16 18 19 18   Height:      Weight:      SpO2: 100% 99% 99% 97%    Wt Readings from Last 3 Encounters:  10/02/14 77.111 kg (170 lb)  07/02/14 78.019 kg (172 lb)  01/01/14 68.947 kg (152 lb)    General:  Appears calm and comfortable. Does not appear to that have increased work of breathing. There is no peripheral central cyanosis. Eyes: PERRL, normal lids, irises & conjunctiva ENT: grossly normal hearing, lips & tongue Neck: no LAD, masses or thyromegaly Cardiovascular: Irregularly irregular, consistent with atrial fibrillation. Telemetry: Atrial fibrillation with rapid ventricular response. Respiratory: Bilateral inspiratory crackles in the both bases and mid zones. Abdomen: soft, ntnd Skin: no rash or induration seen on limited exam Musculoskeletal: grossly normal tone BUE/BLE Psychiatric: grossly normal mood and affect, speech fluent and appropriate Neurologic: grossly non-focal.          Labs on Admission:  Basic Metabolic Panel:  Recent  Labs Lab 10/02/14 1423  NA 140  K 4.0  CL 105  CO2 28  GLUCOSE 162*  BUN 36*  CREATININE 1.64*  CALCIUM 8.6   Liver Function Tests: No results for input(s): AST, ALT, ALKPHOS, BILITOT, PROT, ALBUMIN in the last 168 hours. No results for input(s): LIPASE, AMYLASE in the last 168 hours. No results for input(s): AMMONIA in the last 168 hours. CBC:  Recent Labs Lab 10/02/14 1423  WBC 8.8  NEUTROABS 6.9  HGB 12.1*  HCT 39.2  MCV 97.0  PLT 88*   Cardiac Enzymes:  Recent Labs Lab 10/02/14 1423  TROPONINI 0.06*    BNP (last 3 results) No results for input(s): PROBNP in the last 8760 hours. CBG: No results for  input(s): GLUCAP in the last 168 hours.  Radiological Exams on Admission: Dg Chest Portable 1 View  10/02/2014   CLINICAL DATA:  Shortness of breath.  EXAM: PORTABLE CHEST - 1 VIEW  COMPARISON:  02/06/2012  FINDINGS: Patient is post median sternotomy. Lower lung volumes from prior. The heart is mildly enlarged. There is vascular congestion and questionable mild pulmonary edema. Increased right infrahilar markings, with nodular density in the right lower lung zone. No large pleural effusion. An azygos fissure is again noted. The osseous structures are grossly intact.  IMPRESSION: 1. Findings suggest congestive heart failure. 2. Nodular density in the right lower lung zone, nonspecific. This may reflect a nipple shadow, vessel on-end, versus true pulmonary nodule. Follow up PA and lateral views recommended with nipple markers.   Electronically Signed   By: Jeb Levering M.D.   On: 10/02/2014 14:41    EKG: Independently reviewed. Atrial fibrillation with rapid ventricle response. No acute ST-T wave changes.  Assessment/Plan   1. Atrial fibrillation with rapid ventricular response. This will be treated with intravenous Cardizem drip. He is already anticoagulated with warfarin and his INR is therapeutic. Admit him to the stepdown unit. 2. Acute systolic congestive  heart failure, likely rate related. The troponin and is slightly elevated but this is likely demand ischemia. We will cycle cardiac enzymes. We will give him intravenous Lasix twice a day. Cardiology consultation. Since he had an echocardiogram only 3 months ago, I don't think we need to repeat this at this time. 3. Chronic kidney disease-his creatinine appears to be somewhat stable but we will follow this closely.  Further recommendations will depend on patient's hospital progress.   Code Status: Full code  DVT Prophylaxis: On warfarin  Family Communication: I discussed the plan with the patient at the bedside.   Disposition Plan: Home when medically stable.  Time spent: 60 minutes.  Doree Albee [facility] Pager [phone].

## 2014-10-02 NOTE — Progress Notes (Signed)
ANTICOAGULATION CONSULT NOTE - Initial Consult  Pharmacy Consult for Coumadin  No Known Allergies  Patient Measurements: Height: 5\' 10"  (177.8 cm) Weight: 170 lb (77.111 kg) IBW/kg (Calculated) : 73  Labs:  Recent Labs  10/02/14 1423  HGB 12.1*  HCT 39.2  PLT 88*  LABPROT 35.2*  INR 3.47*  CREATININE 1.64*  TROPONINI 0.06*   Medical History: Past Medical History  Diagnosis Date  . Atrial fibrillation   . Chronic systolic heart failure     LVEF 40-45%  . Emphysema   . Coronary atherosclerosis of native coronary artery     Multivessel status post CABG  . Gastric erosions 12/2005    Per EGD - Dr. [name]  . Colon polyps 12/2005    Per colonoscopy  . Ischemic cardiomyopathy   . Aortic stenosis     Moderate  . Mitral regurgitation     Moderate  . Pulmonary nodules   . Hypothyroidism   . Psoriasis   . Myocardial infarction     Remote  . CKD (chronic kidney disease) stage 3, GFR 30-59 ml/min   . Anemia   . Colon cancer   . Atrial fibrillation   . CHF (congestive heart failure)   . Anxiety   . Shortness of breath     exertion  . GERD (gastroesophageal reflux disease)    Assessment: INR elevated (> 3)  Goal of Therapy:  INR 2-3 Monitor platelets by anticoagulation protocol: Yes   Plan:  HOLD COUMADIN TODAY INR daily  [place] Crane, Juliocesar Blasius A 10/02/2014,5:16 PM

## 2014-10-03 ENCOUNTER — Encounter (HOSPITAL_COMMUNITY): Payer: Self-pay | Admitting: Adult Health

## 2014-10-03 DIAGNOSIS — I5041 Acute combined systolic (congestive) and diastolic (congestive) heart failure: Secondary | ICD-10-CM

## 2014-10-03 DIAGNOSIS — I4891 Unspecified atrial fibrillation: Secondary | ICD-10-CM

## 2014-10-03 DIAGNOSIS — I5043 Acute on chronic combined systolic (congestive) and diastolic (congestive) heart failure: Secondary | ICD-10-CM

## 2014-10-03 LAB — COMPREHENSIVE METABOLIC PANEL
ALK PHOS: 75 U/L (ref 39–117)
ALT: 20 U/L (ref 0–53)
AST: 21 U/L (ref 0–37)
Albumin: 3.8 g/dL (ref 3.5–5.2)
Anion gap: 8 (ref 5–15)
BUN: 34 mg/dL — ABNORMAL HIGH (ref 6–23)
CALCIUM: 8.7 mg/dL (ref 8.4–10.5)
CO2: 31 mmol/L (ref 19–32)
Chloride: 102 mEq/L (ref 96–112)
Creatinine, Ser: 1.44 mg/dL — ABNORMAL HIGH (ref 0.50–1.35)
GFR calc Af Amer: 53 mL/min — ABNORMAL LOW (ref >90)
GFR, EST NON AFRICAN AMERICAN: 46 mL/min — AB (ref >90)
Glucose, Bld: 138 mg/dL — ABNORMAL HIGH (ref 70–99)
POTASSIUM: 3.6 mmol/L (ref 3.5–5.1)
SODIUM: 141 mmol/L (ref 135–145)
Total Bilirubin: 2.4 mg/dL — ABNORMAL HIGH (ref 0.3–1.2)
Total Protein: 7 g/dL (ref 6.0–8.3)

## 2014-10-03 LAB — CBC
HCT: 40.8 % (ref 39.0–52.0)
Hemoglobin: 12.7 g/dL — ABNORMAL LOW (ref 13.0–17.0)
MCH: 30.1 pg (ref 26.0–34.0)
MCHC: 31.1 g/dL (ref 30.0–36.0)
MCV: 96.7 fL (ref 78.0–100.0)
PLATELETS: 101 10*3/uL — AB (ref 150–400)
RBC: 4.22 MIL/uL (ref 4.22–5.81)
RDW: 15.9 % — ABNORMAL HIGH (ref 11.5–15.5)
WBC: 10 10*3/uL (ref 4.0–10.5)

## 2014-10-03 LAB — TSH: TSH: 1.01 u[IU]/mL (ref 0.350–4.500)

## 2014-10-03 LAB — PROTIME-INR
INR: 2.84 — ABNORMAL HIGH (ref 0.00–1.49)
Prothrombin Time: 30 seconds — ABNORMAL HIGH (ref 11.6–15.2)

## 2014-10-03 LAB — TROPONIN I: TROPONIN I: 0.07 ng/mL — AB (ref <0.031)

## 2014-10-03 MED ORDER — DM-GUAIFENESIN ER 30-600 MG PO TB12
1.0000 | ORAL_TABLET | Freq: Two times a day (BID) | ORAL | Status: DC
  Administered 2014-10-04 – 2014-10-05 (×4): 1 via ORAL
  Filled 2014-10-03 (×4): qty 1

## 2014-10-03 MED ORDER — POTASSIUM CHLORIDE CRYS ER 20 MEQ PO TBCR
30.0000 meq | EXTENDED_RELEASE_TABLET | Freq: Once | ORAL | Status: DC
  Filled 2014-10-03 (×2): qty 1

## 2014-10-03 MED ORDER — WARFARIN SODIUM 5 MG PO TABS
5.0000 mg | ORAL_TABLET | Freq: Once | ORAL | Status: AC
  Administered 2014-10-03: 5 mg via ORAL
  Filled 2014-10-03: qty 1

## 2014-10-03 MED ORDER — WARFARIN - PHARMACIST DOSING INPATIENT
Status: DC
  Administered 2014-10-03: 1
  Administered 2014-10-04: 16:00:00

## 2014-10-03 MED ORDER — DILTIAZEM HCL 60 MG PO TABS
60.0000 mg | ORAL_TABLET | Freq: Four times a day (QID) | ORAL | Status: DC
  Administered 2014-10-03: 60 mg via ORAL
  Filled 2014-10-03: qty 1

## 2014-10-03 MED ORDER — CARVEDILOL 3.125 MG PO TABS
3.1250 mg | ORAL_TABLET | Freq: Two times a day (BID) | ORAL | Status: DC
  Administered 2014-10-03 – 2014-10-05 (×4): 3.125 mg via ORAL
  Filled 2014-10-03 (×4): qty 1

## 2014-10-03 MED ORDER — DILTIAZEM HCL 30 MG PO TABS
30.0000 mg | ORAL_TABLET | Freq: Four times a day (QID) | ORAL | Status: DC
  Administered 2014-10-03 – 2014-10-05 (×8): 30 mg via ORAL
  Filled 2014-10-03 (×8): qty 1

## 2014-10-03 NOTE — Progress Notes (Signed)
Tried to explain to patient while on the commode not to bear down, so much.  But he continually bears down over and over, face red. Loose stool, had a BM earlier today.  Patient remains in Afib rate of 99. Admitted yesterday. Will investigate as to how many stools he has had since admission.

## 2014-10-03 NOTE — Progress Notes (Signed)
ANTICOAGULATION CONSULT NOTE - Follow Up Consult  Pharmacy Consult for Coumadin (chronic Rx PTA) Indication: atrial fibrillation  No Known Allergies  Patient Measurements: Height: 5\' 10"  (177.8 cm) Weight: 170 lb (77.111 kg) IBW/kg (Calculated) : 73  Vital Signs: Temp: 98.2 F (36.8 C) (12/23 0808) Temp Source: Oral (12/23 0808) BP: 115/51 mmHg (12/23 0800) Pulse Rate: 96 (12/23 0800)  Labs:  Recent Labs  10/02/14 1423 10/02/14 1724 10/02/14 2255 10/03/14 0440  HGB 12.1*  --   --  12.7*  HCT 39.2  --   --  40.8  PLT 88*  --   --  101*  LABPROT 35.2*  --   --  30.0*  INR 3.47*  --   --  2.84*  CREATININE 1.64*  --   --  1.44*  TROPONINI 0.06* 0.06* 0.08* 0.07*   Estimated Creatinine Clearance: 45.8 mL/min (by C-G formula based on Cr of 1.44).  Medications:  Prescriptions prior to admission  Medication Sig Dispense Refill Last Dose  . ALPRAZolam (XANAX) 1 MG tablet Take 1 mg by mouth as needed for anxiety.    unknown  . carvedilol (COREG) 6.25 MG tablet TAKE (1) TABLET BY MOUTH TWICE DAILY WITH A MEAL. 60 tablet 6 10/02/2014 at 0900  . Dextromethorphan-Guaifenesin 60-1200 MG per 12 hr tablet Take 1 tablet by mouth every 12 (twelve) hours. 60 tablet 0 10/02/2014 at Unknown time  . furosemide (LASIX) 40 MG tablet Take 1 tablet (40 mg total) by mouth daily. 90 tablet 3 10/02/2014 at Unknown time  . HYDROcodone-acetaminophen (NORCO) 5-325 MG per tablet Take 1-2 tablets by mouth every 4 (four) hours as needed. (Patient taking differently: Take 1 tablet by mouth every 6 (six) hours as needed for moderate pain. ) 40 tablet 0 unknown  . Ipratropium-Albuterol (COMBIVENT RESPIMAT) 20-100 MCG/ACT AERS respimat Inhale 1 puff into the lungs as needed. (Patient taking differently: Inhale 1 puff into the lungs as needed for wheezing or shortness of breath. ) 1 Inhaler 1 unknown  . loperamide (IMODIUM) 2 MG capsule Take 1 capsule (2 mg total) by mouth as needed for diarrhea or loose  stools. 20 capsule 2 unknown  . Magnesium Hydroxide (MILK OF MAGNESIA PO) Take 30 mLs by mouth as needed (stomach pain).    unknown  . potassium chloride SA (K-DUR,KLOR-CON) 20 MEQ tablet Take 20 mEq by mouth.    10/02/2014 at Unknown time  . warfarin (COUMADIN) 5 MG tablet Take 1 tablet daily except 1/2 tablet on Sundays, Tuesdays and Thursdays (Patient taking differently: Take 2.5-5 mg by mouth See admin instructions. Take 1 tablet daily except 1/2 tablet on Sundays, Tuesdays and Thursdays) 30 tablet 6 10/02/2014 at Unknown time   Assessment: 73yo male on chronic Coumadin PTA for h/o afib.  Home dose listed above.  INR was supratherapeutic on admission but is now within goal range.  No coumadin was given yesterday.  No bleeding reported.   H/H stable but platelets are low.  Monitor.    Goal of Therapy:  INR 2-3 Monitor platelets by anticoagulation protocol: Yes   Plan:   Coumadin 5mg  po x 1 today (per home regimen)  INR daily until stable.  Monitor CBC, platelets  Bernadett Milian A 10/03/2014,8:35 AM

## 2014-10-03 NOTE — Plan of Care (Signed)
Problem: Phase II Progression Outcomes Goal: Ventricular heart rate < 100/min Outcome: Progressing Hr is in the 100's to 108

## 2014-10-03 NOTE — Consult Note (Signed)
CARDIOLOGY CONSULT NOTE   Patient ID: TRAMAR BRUECKNER MRN: [id number] DOB/AGE: [birth year] 73 y.o.  Admit Date: 10/02/2014 Referring Physician: PTH-Hernandez Primary Physician: Collene Mares, PA-C Consulting Cardiologist: Carlyle Dolly MD Primary Cardiologist: Rozann Lesches MD  Reason for Consultation: Atrial fib with RVR  Clinical Summary Mr. 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   States symptoms occurred about one week ago. He had eaten at [facility]. Also was not taking lasix as directed as this caused him to urinate too much and he could not go out. He was also recently treated with an abx shot from Dr. Collene Mares due to bronchitis. Had been checked in our coumadin clinic and had coumadin dose adjusted and was due to be seen at the end of the month.   In ER, initial HR was recorded at 49 bpm, but rose to 189 bpm one hour later, with BP of 112/79. Thrombocytopenic, platelets of 88. Creatinine of 1.64. Troponin 0.06. INR 3.47. CXR demonstrated CHF. EKG, demonstrated atrial fib with rate of 164 bpm with inferior/lateral ST depression, likely rate related. Was given IV diltiazem bolus, and started on diltiazem gtt. Given lasix 80 mg X 1.Has diuresed 2.5 liters since admission. He continues on diltiazem gtt at 5 mg hour.    No Known Allergies  Medications Scheduled Medications: . carvedilol  6.25 mg Oral BID WC  . furosemide  40 mg Intravenous Q12H  . potassium chloride SA  20 mEq Oral Daily  . sodium chloride  3 mL Intravenous Q12H  . sodium chloride  3 mL Intravenous Q12H    Infusions: . diltiazem (CARDIZEM) infusion 5 mg/hr (10/03/14 0417)    PRN Medications: ALPRAZolam, HYDROcodone-acetaminophen, ipratropium-albuterol, loperamide, ondansetron **OR** ondansetron (ZOFRAN) IV   Past Medical  History  Diagnosis Date  . Atrial fibrillation   . Chronic systolic heart failure     LVEF 40-45%  . Emphysema   . Coronary atherosclerosis of native coronary artery     Multivessel status post CABG  . Gastric erosions 12/2005    Per EGD - Dr. [name]  . Colon polyps 12/2005    Per colonoscopy  . Ischemic cardiomyopathy   . Aortic stenosis     Moderate  . Mitral regurgitation     Moderate  . Pulmonary nodules   . Hypothyroidism   . Psoriasis   . Myocardial infarction     Remote  . CKD (chronic kidney disease) stage 3, GFR 30-59 ml/min   . Anemia   . Colon cancer   . Atrial fibrillation   . CHF (congestive heart failure)   . Anxiety   . Shortness of breath     exertion  . GERD (gastroesophageal reflux disease)     Past Surgical History  Procedure Laterality Date  . Coronary artery bypass graft  March 2004    LIMA to LAD, SVG to diagonal, SVG to OM 2, SVG to RCA  . Flexible sigmoidoscopy  10/25/2012    Procedure: FLEXIBLE SIGMOIDOSCOPY;  Surgeon: [name], MD;  Location: [facility];  Service: Gastroenterology;  Laterality: N/A;  . Partial colectomy  10/26/2012    Procedure: PARTIAL COLECTOMY;  Surgeon: [name], MD;  Location: [facility];  Service: General;  Laterality: N/A;  Closure of Colovesical Fistula  . Colectomy N/A  12/13/2013    Procedure: TOTAL COLECTOMY;  Surgeon: [name], MD;  Location: [facility];  Service: General;  Laterality: N/A;    Family History  Problem Relation Age of Onset  . Cancer Brother   . Hypertension Other     Social History Mr. Michel reports that he quit smoking about 11 years ago. His smoking use included Cigarettes. He has a 30 pack-year smoking history. He has never used smokeless tobacco. Mr. Gravely reports that he does not drink alcohol.  Review of Systems Complete review of systems are found to be negative unless outlined in H&P above.  Physical Examination Blood pressure 121/61, pulse 102, temperature 99 F (37.2 C),  temperature source Oral, resp. rate 19, height 5\' 10"  (1.778 m), weight 170 lb (77.111 kg), SpO2 97 %.  Intake/Output Summary (Last 24 hours) at 10/03/14 0804 Last data filed at 10/03/14 0200  Gross per 24 hour  Intake     85 ml  Output   2276 ml  Net  -2191 ml    Telemetry: Atrial fib, rate of 110-110 bpm.  GEN: Resting but coughing frequently. HEENT: Conjunctiva and lids normal, oropharynx clear with moist mucosa. Neck: Supple, mildly elevated JVP or carotid bruits, no thyromegaly. Lungs: Inspiratory and expiratory wheezes, with bibasilar rhonchi. Frequent productive coughing.  Cardiac: Iregular rate and rhythm,tachycardic  no S3 1/6 systolic murmur Abdomen: Soft, nontender, no hepatomegaly, bowel sounds present, no guarding or rebound. Extremities: No pitting edema, distal pulses 2+. Skin: Warm and dry. Musculoskeletal: No kyphosis. Neuropsychiatric: Alert and oriented x3, affect grossly appropriate.  Prior Cardiac Testing/Procedures 1. Echocardiogram 07/04/2014 Left ventricle: The cavity size was normal. Wall thickness was increased in a pattern of mild LVH. Systolic function was mildly reduced. The estimated ejection fraction was in the range of 45% to 50%. The study was not technically sufficient to allow evaluation of LV diastolic dysfunction due to atrial fibrillation. There is evidence of significantly elevated LA pressure (pulmonary vein flow systolic flunting, E/e&' 24) - Aortic valve: Mildly calcified annulus. Moderately thickened leaflets. Valve area (VTI): 1.51 cm^2. Valve area (Vmax): 1.7 cm^2. Valve area (Vmean): 1.39 cm^2. - Mitral valve: Mildly calcified annulus. Moderately thickened leaflets . There was moderate regurgitation. The MR vena contracta is 0.5 cm. - Left atrium: The atrium was severely dilated. - Pulmonary veins: There is pulmonary vein flow systolic blunting, suggestive of elevated LA pressure. - Right ventricle: The cavity  size was mildly dilated. - Right atrium: The atrium was moderately dilated. - Pulmonary arteries: Systolic pressure was moderately increased. PA peak pressure: 58 mm Hg (S). - Inferior vena cava: The vessel was dilated. The respirophasic diameter changes were blunted (< 50%), consistent with elevated central venous pressure.  NM Cardiolite: 10/18/2012 Normal left ventricle ejection fraction 58% with normal LV wall Motion. Subtle area of reversible decreased myocardial perfusion involving the anteroseptal wall of the left ventricle near the apex, new since previous exam.  Cardiac Cath 02/25/2011 ANGIOGRAPHIC DATA: 1. Right coronary artery: The right coronary artery is a large caliber  vessel. It is a dominant vessel. It has proximal high-grade tortuosity  and a 90% stenosis in its proximal to mid segment. This lesion appears  thrombotic. The mid vessel also has mild to moderate tortuosity. The  proximal tortuosity is a very acute angle and severe and has a shepherd's  hook appearance. 2. Left main coronary artery: The left main coronary artery is a large  caliber vessel. It is normal. 3. Left circumflex: The left  circumflex is a large caliber vessel. It  gives origin to a moderate to large-sized obtuse marginal 1 and continues  as obtuse marginal 2. The mid segment of this vessel has 99% stenosis. 4. Left anterior descending artery: The left anterior descending artery is  a large caliber vessel. It gives origin to a large-sized diagonal 1 and  a large septal perforator. At the trifurcation point of diagonal 1 and  septal perforator, there is a thrombotic-appearing 90% stenosis. There  is also mild ectasia at the same region. 5. Left ventriculogram: Left ventriculography revealed a mild inferior wall  hypokinesis with an ejection fraction of 45%. 6. Right subclavian artery and RIMA: The left subclavian artery and  RIMA  are widely patent. 7. Abdominal aortogram: The abdominal aortogram revealed no evidence of  abdominal aortic aneurysm. 8. Selective right and left renal arteriography. The right renal artery has  mild disease in its proximal segment. The left renal artery has 95% high-  grade ostial stenosis.  CABG 02/25/2011 PROCEDURE: Coronary artery bypass grafting x4 (left internal mammary artery to left anterior descending coronary artery, saphenous vein graft to diagonal, saphenous vein graft to obtuse marginal 2, saphenous vein graft to right coronary artery).  Lab Results  Basic Metabolic Panel:  Recent Labs Lab 10/02/14 1423 10/03/14 0440  NA 140 141  K 4.0 3.6  CL 105 102  CO2 28 31  GLUCOSE 162* 138*  BUN 36* 34*  CREATININE 1.64* 1.44*  CALCIUM 8.6 8.7    Liver Function Tests:  Recent Labs Lab 10/03/14 0440  AST 21  ALT 20  ALKPHOS 75  BILITOT 2.4*  PROT 7.0  ALBUMIN 3.8    CBC:  Recent Labs Lab 10/02/14 1423 10/03/14 0440  WBC 8.8 10.0  NEUTROABS 6.9  --   HGB 12.1* 12.7*  HCT 39.2 40.8  MCV 97.0 96.7  PLT 88* 101*    Cardiac Enzymes:  Recent Labs Lab 10/02/14 1423 10/02/14 1724 10/02/14 2255 10/03/14 0440  TROPONINI 0.06* 0.06* 0.08* 0.07*    Radiology: Dg Chest Portable 1 View  10/02/2014   CLINICAL DATA:  Shortness of breath.  EXAM: PORTABLE CHEST - 1 VIEW  COMPARISON:  02/06/2012  FINDINGS: Patient is post median sternotomy. Lower lung volumes from prior. The heart is mildly enlarged. There is vascular congestion and questionable mild pulmonary edema. Increased right infrahilar markings, with nodular density in the right lower lung zone. No large pleural effusion. An azygos fissure is again noted. The osseous structures are grossly intact.  IMPRESSION: 1. Findings suggest congestive heart failure. 2. Nodular density in the right lower lung zone, nonspecific. This may reflect a nipple shadow, vessel on-end, versus true  pulmonary nodule. Follow up PA and lateral views recommended with nipple markers.   Electronically Signed   By: Jeb Levering M.D.   On: 10/02/2014 14:41     [id number] fib with RVR   Impression and Recommendations 1.Atrial fib with RVR: States he has been compliant with cardiac meds carvedilol and warfarin. Symptoms began one week ago associated with frequent coughing and congestion. Remains on diltiazem gtt at 5 mg/hour. Would change to Xopenex from albuterol as this has less effect on heart rate. BP is soft. He is wheezing. Will transition to po diltiazem 60 mg Q 6 hours. Will wheezing will decrease dose of BB. Creatinine is 1.44, use of dig may be tenuous especially with CKD. Continue coumadin per pharmacy. Would not add dig now. Amiodarone may also not be option due to  lung disease.  2.Acute on Chronic Mixed CHF: Due to medical non-compliance and atrial rate. On  lasix 40 mg IV BID, with good diureses. Last echo in Sept demonstrated EF of 45%-50%. Office wt in Sept 2015, 172 lbs. Wt this am 170 lbs. Transition to po lasix in am.   3. COPD Exacerbation: Continues on O2 and neb tx Change to Xopenex as discussed.   4. CKD: Creatinine down from 1.64 to 1.44 with lasix.   5. Thrombocytopenia: Platelets 88-101. Chronic   Signed: Phill Myron. Lawrence NP [place]  10/03/2014, 8:04 AM Co-Sign MD  Patient seen and discussed with NP Purcell Nails, I agree with her documentation above. 73 yo male hx of afib, chronic systolic HF LVEF 62-03%, COPD, mod MR, CAD with prior CABG, admitted with dyspnea and productive cough.    INR 3.47 K 4.0 Cr 1.64 (baseline 1.3-1.6) WBC 8.8 Hgb 12.11 Plt 88 (chronic) BNP 395 TSH pending Trop 0.07 CXR pulm edema Lexiscan MPI Jan 2014 low risk EKG afib RVR, lateral ST depressions chronic Echo 06/2014 LVEF 45-50%, cannot eval diastolic function, mod MR, elevated LA pressures  Afib with RVR combined with acute on chronic combined systolic/diastolic heart failure. CHF due  to afib, he also reports diuretic non-compliance. Negative 2 liters since admission, Cr trending down with diuresis consistent with venous congestion. Continue IV lasix 40mg  bid. Has some active bronchospasm, reasonable to avoid beta blocker titration at this time. With low normal LVEF dilt is a reasonable option for rate control, start oral dilt with plans to wean drip. F/u TSH, no indication for repeat echo. Troponin just above detectable level, he has no chest pain. This is related to afib, no ischemic evaluation indicated at this time.    Zandra Abts MD

## 2014-10-03 NOTE — Progress Notes (Signed)
[facility] PROGRESS NOTE  Cory Ryan JKK:[id number] DOB: Nov 12, 1940 DOA: 10/02/2014 PCP: Collene Mares, PA-C  Assessment/Plan: Atrial fibrillation with rapid ventricular response -Rates improved on Cardizem drip. -Oral Cardizem has been started with plans to wean drip as tolerated. -We'll avoid beta blocker titration given wheezing on lung exam.  Acute on chronic combined CHF -Likely related to weight issues as well as noncompliance with Lasix. -Is 2.3 L negative since admission; continue diuresis  Chronic kidney disease stage III -Creatinine at baseline of between 1.5-1.6.  Code Status: Full Family Communication: Children at bedside  Disposition Plan: To be determined   Consultants:  Cardiology   Antibiotics:  None   Subjective: SOB improved, no palpitations  Objective: Filed Vitals:   10/03/14 0900 10/03/14 1000 10/03/14 1100 10/03/14 1148  BP: 113/52 102/80 122/60   Pulse: 104 93 93   Temp:    98.1 F (36.7 C)  TempSrc:    Oral  Resp: 16 18 17    Height:      Weight:      SpO2: 99% 99% 96%     Intake/Output Summary (Last 24 hours) at 10/03/14 1227 Last data filed at 10/03/14 1100  Gross per 24 hour  Intake    805 ml  Output   3101 ml  Net  -2296 ml   Filed Weights   10/02/14 1334  Weight: 77.111 kg (170 lb)    Exam:   General:  AA Ox3  Cardiovascular: irregular, no M/R/G  Respiratory: Exp wheezes  Abdomen: S/NT/ND/+BS  Extremities: trace bilateral edema   Neurologic:  Non-focal  Data Reviewed: Basic Metabolic Panel:  Recent Labs Lab 10/02/14 1423 10/03/14 0440  NA 140 141  K 4.0 3.6  CL 105 102  CO2 28 31  GLUCOSE 162* 138*  BUN 36* 34*  CREATININE 1.64* 1.44*  CALCIUM 8.6 8.7   Liver Function Tests:  Recent Labs Lab 10/03/14 0440  AST 21  ALT 20  ALKPHOS 75  BILITOT 2.4*  PROT 7.0  ALBUMIN 3.8   No results for input(s): LIPASE, AMYLASE in the last 168 hours. No results for input(s): AMMONIA in  the last 168 hours. CBC:  Recent Labs Lab 10/02/14 1423 10/03/14 0440  WBC 8.8 10.0  NEUTROABS 6.9  --   HGB 12.1* 12.7*  HCT 39.2 40.8  MCV 97.0 96.7  PLT 88* 101*   Cardiac Enzymes:  Recent Labs Lab 10/02/14 1423 10/02/14 1724 10/02/14 2255 10/03/14 0440  TROPONINI 0.06* 0.06* 0.08* 0.07*   BNP (last 3 results) No results for input(s): PROBNP in the last 8760 hours. CBG: No results for input(s): GLUCAP in the last 168 hours.  Recent Results (from the past 240 hour(s))  Culture, blood (routine x 2)     Status: None (Preliminary result)   Collection Time: 10/02/14  2:37 PM  Result Value Ref Range Status   Specimen Description BLOOD RIGHT HAND  Final   Special Requests BOTTLES DRAWN AEROBIC ONLY 4CC  Final   Culture NO GROWTH 1 DAY  Final   Report Status PENDING  Incomplete  Culture, blood (routine x 2)     Status: None (Preliminary result)   Collection Time: 10/02/14  2:38 PM  Result Value Ref Range Status   Specimen Description BLOOD RIGHT HAND  Final   Special Requests   Final    BOTTLES DRAWN AEROBIC AND ANAEROBIC AEB=6CC ANA=4CC   Culture NO GROWTH 1 DAY  Final   Report Status PENDING  Incomplete  MRSA PCR Screening     Status: None   Collection Time: 10/02/14  6:00 PM  Result Value Ref Range Status   MRSA by PCR NEGATIVE NEGATIVE Final    Comment:        The GeneXpert MRSA Assay (FDA approved for NASAL specimens only), is one component of a comprehensive MRSA colonization surveillance program. It is not intended to diagnose MRSA infection nor to guide or monitor treatment for MRSA infections.      Studies: Dg Chest Portable 1 View  10/02/2014   CLINICAL DATA:  Shortness of breath.  EXAM: PORTABLE CHEST - 1 VIEW  COMPARISON:  02/06/2012  FINDINGS: Patient is post median sternotomy. Lower lung volumes from prior. The heart is mildly enlarged. There is vascular congestion and questionable mild pulmonary edema. Increased right infrahilar markings,  with nodular density in the right lower lung zone. No large pleural effusion. An azygos fissure is again noted. The osseous structures are grossly intact.  IMPRESSION: 1. Findings suggest congestive heart failure. 2. Nodular density in the right lower lung zone, nonspecific. This may reflect a nipple shadow, vessel on-end, versus true pulmonary nodule. Follow up PA and lateral views recommended with nipple markers.   Electronically Signed   By: Jeb Levering M.D.   On: 10/02/2014 14:41    Scheduled Meds: . carvedilol  3.125 mg Oral BID WC  . diltiazem  30 mg Oral 4 times per day  . furosemide  40 mg Intravenous Q12H  . potassium chloride SA  20 mEq Oral Daily  . sodium chloride  3 mL Intravenous Q12H  . sodium chloride  3 mL Intravenous Q12H  . warfarin  5 mg Oral Once  . Warfarin - Pharmacist Dosing Inpatient   Does not apply Q24H   Continuous Infusions:   Principal Problem:   Atrial fibrillation with rapid ventricular response Active Problems:   Acute on chronic combined systolic and diastolic congestive heart failure   Systolic CHF, chronic   Long term current use of anticoagulant therapy   Coronary atherosclerosis of native coronary artery   Aortic stenosis   CKD (chronic kidney disease) stage 3, GFR 30-59 ml/min    Time spent: 35 minutes. Greater than 50% of this time was spent in direct contact with the patient coordinating care.    Lelon Frohlich  [facility] Pager [phone]  If 7PM-7AM, please contact night-coverage at [email or web address], password [facility] 10/03/2014, 12:27 PM  LOS: 1 day

## 2014-10-03 NOTE — Care Management Utilization Note (Signed)
UR completed 

## 2014-10-04 ENCOUNTER — Inpatient Hospital Stay (HOSPITAL_COMMUNITY): Payer: Medicare Other

## 2014-10-04 LAB — PROTIME-INR
INR: 2.58 — AB (ref 0.00–1.49)
Prothrombin Time: 27.9 seconds — ABNORMAL HIGH (ref 11.6–15.2)

## 2014-10-04 LAB — BASIC METABOLIC PANEL
Anion gap: 6 (ref 5–15)
BUN: 35 mg/dL — AB (ref 6–23)
CHLORIDE: 103 meq/L (ref 96–112)
CO2: 27 mmol/L (ref 19–32)
Calcium: 8.1 mg/dL — ABNORMAL LOW (ref 8.4–10.5)
Creatinine, Ser: 1.29 mg/dL (ref 0.50–1.35)
GFR calc non Af Amer: 53 mL/min — ABNORMAL LOW (ref >90)
GFR, EST AFRICAN AMERICAN: 61 mL/min — AB (ref >90)
GLUCOSE: 125 mg/dL — AB (ref 70–99)
POTASSIUM: 3.9 mmol/L (ref 3.5–5.1)
Sodium: 136 mmol/L (ref 135–145)

## 2014-10-04 LAB — CBC
HCT: 39.1 % (ref 39.0–52.0)
HEMOGLOBIN: 12.1 g/dL — AB (ref 13.0–17.0)
MCH: 29.7 pg (ref 26.0–34.0)
MCHC: 30.9 g/dL (ref 30.0–36.0)
MCV: 95.8 fL (ref 78.0–100.0)
Platelets: 85 10*3/uL — ABNORMAL LOW (ref 150–400)
RBC: 4.08 MIL/uL — ABNORMAL LOW (ref 4.22–5.81)
RDW: 15.8 % — AB (ref 11.5–15.5)
WBC: 8.7 10*3/uL (ref 4.0–10.5)

## 2014-10-04 MED ORDER — LEVALBUTEROL HCL 0.63 MG/3ML IN NEBU
0.6300 mg | INHALATION_SOLUTION | Freq: Four times a day (QID) | RESPIRATORY_TRACT | Status: DC
  Administered 2014-10-04 – 2014-10-05 (×4): 0.63 mg via RESPIRATORY_TRACT
  Filled 2014-10-04 (×5): qty 3

## 2014-10-04 MED ORDER — WARFARIN SODIUM 2.5 MG PO TABS
2.5000 mg | ORAL_TABLET | Freq: Once | ORAL | Status: AC
  Administered 2014-10-04: 2.5 mg via ORAL
  Filled 2014-10-04: qty 1

## 2014-10-04 MED ORDER — PREDNISONE 20 MG PO TABS
60.0000 mg | ORAL_TABLET | Freq: Every day | ORAL | Status: DC
  Administered 2014-10-05: 60 mg via ORAL
  Filled 2014-10-04: qty 3

## 2014-10-04 NOTE — Plan of Care (Signed)
Problem: Phase I Progression Outcomes Goal: Ventricular heart rate < 120/min Outcome: Completed/Met Date Met:  10/04/14 HR has remained below 100 tonight Goal: Anticoagulation Therapy per MD order Outcome: Completed/Met Date Met:  10/04/14 Long term coumadin patient Goal: Heart rate or rhythm control medication Outcome: Completed/Met Date Met:  10/04/14 On PO diltiazem and has been seen by cardiology

## 2014-10-04 NOTE — Progress Notes (Signed)
Consulting cardiologist: Carlyle Dolly MD Primary Cardiologist: Rozann Lesches MD  Cardiology Specific Problem List: 1. Atrial fib with RVR 2. ICM: EF of 40-45% 3. Systolic CHF  Subjective:   Continues very congested, coughing, heart rate some better. Continues weak.   Objective:   Temp:  [98 F (36.7 C)-98.4 F (36.9 C)] 98 F (36.7 C) (12/24 0400) Pulse Rate:  [38-105] 92 (12/24 0300) Resp:  [15-29] 17 (12/24 0300) BP: (88-163)/(47-146) 102/47 mmHg (12/24 0551) SpO2:  [94 %-100 %] 99 % (12/24 0300) Weight:  [169 lb 1.5 oz (76.7 kg)] 169 lb 1.5 oz (76.7 kg) (12/24 0430) Last BM Date: 10/03/14  Filed Weights   10/02/14 1334 10/04/14 0430  Weight: 170 lb (77.111 kg) 169 lb 1.5 oz (76.7 kg)    Intake/Output Summary (Last 24 hours) at 10/04/14 0745 Last data filed at 10/04/14 0100  Gross per 24 hour  Intake   1200 ml  Output   2300 ml  Net  -1100 ml    Telemetry:Atarial fib rates 100-110. Short run of VT while on commode having BM  Exam:  General: Dyspneic  HEENT: Conjunctiva and lids normal, oropharynx clear.  Lungs: Inspiratory rales and rhonchi. Frequent coughing.   Cardiac: No elevated JVP or bruits. RRR, no gallop or rub.   Abdomen: Normoactive bowel sounds, nontender, nondistended.  Extremities: No pitting edema, distal pulses full.  Neuropsychiatric: Alert and oriented x3, affect appropriate.   Lab Results:  Basic Metabolic Panel:  Recent Labs Lab 10/02/14 1423 10/03/14 0440  NA 140 141  K 4.0 3.6  CL 105 102  CO2 28 31  GLUCOSE 162* 138*  BUN 36* 34*  CREATININE 1.64* 1.44*  CALCIUM 8.6 8.7    Liver Function Tests:  Recent Labs Lab 10/03/14 0440  AST 21  ALT 20  ALKPHOS 75  BILITOT 2.4*  PROT 7.0  ALBUMIN 3.8    CBC:  Recent Labs Lab 10/02/14 1423 10/03/14 0440 10/04/14 0509  WBC 8.8 10.0 8.7  HGB 12.1* 12.7* 12.1*  HCT 39.2 40.8 39.1  MCV 97.0 96.7 95.8  PLT 88* 101* 85*    Cardiac Enzymes:  Recent  Labs Lab 10/02/14 1724 10/02/14 2255 10/03/14 0440  TROPONINI 0.06* 0.08* 0.07*    Coagulation:  Recent Labs Lab 10/02/14 1423 10/03/14 0440 10/04/14 0509  INR 3.47* 2.84* 2.58*    Radiology: Dg Chest Portable 1 View  10/02/2014   CLINICAL DATA:  Shortness of breath.  EXAM: PORTABLE CHEST - 1 VIEW  COMPARISON:  02/06/2012  FINDINGS: Patient is post median sternotomy. Lower lung volumes from prior. The heart is mildly enlarged. There is vascular congestion and questionable mild pulmonary edema. Increased right infrahilar markings, with nodular density in the right lower lung zone. No large pleural effusion. An azygos fissure is again noted. The osseous structures are grossly intact.  IMPRESSION: 1. Findings suggest congestive heart failure. 2. Nodular density in the right lower lung zone, nonspecific. This may reflect a nipple shadow, vessel on-end, versus true pulmonary nodule. Follow up PA and lateral views recommended with nipple markers.   Electronically Signed   By: Jeb Levering M.D.   On: 10/02/2014 14:41     Medications:   Scheduled Medications: . carvedilol  3.125 mg Oral BID WC  . dextromethorphan-guaiFENesin  1 tablet Oral BID  . diltiazem  30 mg Oral 4 times per day  . furosemide  40 mg Intravenous Q12H  . potassium chloride  30 mEq Oral Once  . potassium  chloride SA  20 mEq Oral Daily  . sodium chloride  3 mL Intravenous Q12H  . sodium chloride  3 mL Intravenous Q12H  . Warfarin - Pharmacist Dosing Inpatient   Does not apply Q24H       PRN Medications: ALPRAZolam, HYDROcodone-acetaminophen, ipratropium-albuterol, loperamide, ondansetron **OR** ondansetron (ZOFRAN) IV   Assessment and Plan:   1.Atrial fib with RVR: Heart rate is still elevated but better. Off diltiazem gtt. Now on po with coreg 3.125 BID (lower dose as he was wheezing), diltiazem 30 mg Q 6 hrs, decreased due to hypotension. Will change duoneb to Xopenex due to elevated HR. Check BMET this  am for kidney fx and potassium status on diuretics.   2.Acute on chronic systolic CHF: He has diuresed 3.2 liters since admission. Wt is down to 169. No evidence of fluid overload presently but continues significant lung congestion. I will repeat the CXR this am. May need to consider antibiotics if clinically indicated.   3. COPD: Exacerbation. Consider antibiotics. Pulmonary toilet.   Phill Myron. Lawrence NP [facility]  10/04/2014, 7:45 AM   Patient seen and discussed with NP Purcell Nails, I agree with her documentation above. Regarding acute on chronic systolic HF, he is negative 1.1 liters yesterday and 3.3 liters since admission. He is on lasix 40mg  IV bid, Cr has been trending down with diuresis consistent with CHF/venous congestion. Still with some evidence of volume overload on exam, continue IV lasix today, likely change to oral tomorrow. Afib rate controlled with coreg and diltiazem, has had active wheezing so have not titrated up beta blocker. With low normal LVEF dilt is reasonable option. Has has had some soft blood pressures, will hold off on changing to long acting dilt today, continue short acting dilt with hold parameters. Rate control goal <110 which he primarily is achieving   Zandra Abts MD

## 2014-10-04 NOTE — Progress Notes (Signed)
ANTICOAGULATION CONSULT NOTE - Follow Up Consult  Pharmacy Consult for Coumadin (chronic Rx PTA) Indication: atrial fibrillation  No Known Allergies  Patient Measurements: Height: 5\' 10"  (177.8 cm) Weight: 169 lb 1.5 oz (76.7 kg) IBW/kg (Calculated) : 73  Vital Signs: Temp: 98.1 F (36.7 C) (12/24 0800) Temp Source: Oral (12/24 0800) BP: 102/47 mmHg (12/24 0551) Pulse Rate: 92 (12/24 0300)  Labs:  Recent Labs  10/02/14 1423 10/02/14 1724 10/02/14 2255 10/03/14 0440 10/04/14 0509 10/04/14 0801  HGB 12.1*  --   --  12.7* 12.1*  --   HCT 39.2  --   --  40.8 39.1  --   PLT 88*  --   --  101* 85*  --   LABPROT 35.2*  --   --  30.0* 27.9*  --   INR 3.47*  --   --  2.84* 2.58*  --   CREATININE 1.64*  --   --  1.44*  --  1.29  TROPONINI 0.06* 0.06* 0.08* 0.07*  --   --    Estimated Creatinine Clearance: 51.1 mL/min (by C-G formula based on Cr of 1.29).  Medications:  Prescriptions prior to admission  Medication Sig Dispense Refill Last Dose  . ALPRAZolam (XANAX) 1 MG tablet Take 1 mg by mouth as needed for anxiety.    unknown  . carvedilol (COREG) 6.25 MG tablet TAKE (1) TABLET BY MOUTH TWICE DAILY WITH A MEAL. 60 tablet 6 10/02/2014 at 0900  . Dextromethorphan-Guaifenesin 60-1200 MG per 12 hr tablet Take 1 tablet by mouth every 12 (twelve) hours. 60 tablet 0 10/02/2014 at Unknown time  . furosemide (LASIX) 40 MG tablet Take 1 tablet (40 mg total) by mouth daily. 90 tablet 3 10/02/2014 at Unknown time  . HYDROcodone-acetaminophen (NORCO) 5-325 MG per tablet Take 1-2 tablets by mouth every 4 (four) hours as needed. (Patient taking differently: Take 1 tablet by mouth every 6 (six) hours as needed for moderate pain. ) 40 tablet 0 unknown  . Ipratropium-Albuterol (COMBIVENT RESPIMAT) 20-100 MCG/ACT AERS respimat Inhale 1 puff into the lungs as needed. (Patient taking differently: Inhale 1 puff into the lungs as needed for wheezing or shortness of breath. ) 1 Inhaler 1 unknown  .  loperamide (IMODIUM) 2 MG capsule Take 1 capsule (2 mg total) by mouth as needed for diarrhea or loose stools. 20 capsule 2 unknown  . Magnesium Hydroxide (MILK OF MAGNESIA PO) Take 30 mLs by mouth as needed (stomach pain).    unknown  . potassium chloride SA (K-DUR,KLOR-CON) 20 MEQ tablet Take 20 mEq by mouth.    10/02/2014 at Unknown time  . warfarin (COUMADIN) 5 MG tablet Take 1 tablet daily except 1/2 tablet on Sundays, Tuesdays and Thursdays (Patient taking differently: Take 2.5-5 mg by mouth See admin instructions. Take 1 tablet daily except 1/2 tablet on Sundays, Tuesdays and Thursdays) 30 tablet 6 10/02/2014 at Unknown time   Assessment: 73yo male on chronic Coumadin PTA for h/o afib.  Home dose listed above.  INR was supratherapeutic on admission but is now within goal range.  No bleeding noted.   H/H stable but platelets are low.  Monitor.    Goal of Therapy:  INR 2-3 Monitor platelets by anticoagulation protocol: Yes   Plan:   Coumadin 2.5mg  po x 1 today (per home regimen)  INR daily until stable.  Monitor CBC, platelets  Marianny Goris A 10/04/2014,8:56 AM

## 2014-10-04 NOTE — Progress Notes (Signed)
[facility] PROGRESS NOTE  CAMP GOPAL YHC:[id number] DOB: [birth year] DOA: 10/02/2014 PCP: Collene Mares, PA-C  Assessment/Plan: Atrial fibrillation with rapid ventricular response -Rates improved on PO cardizem. -Will avoid transitioning to long-acting cardizem given some hypotension overnight but can consider doing so tomorrow if BP improves. -Anticoagulated on coumadin. -We'll avoid beta blocker titration given wheezing on lung exam.  Wheezing -No formal diagnosis of COPD. -Will start on prednisone and albuterol nebs.  Acute on chronic combined CHF -Likely related to weight issues as well as noncompliance with Lasix. -Is 3.2 L negative since admission; continue diuresis  Chronic kidney disease stage III -Creatinine better than baseline of between 1.5-1.6 at 1.29 today.  Code Status: Full Family Communication: Patient only Disposition Plan: Hopeful for DC home in 1-2 days.   Consultants:  Cardiology   Antibiotics:  None   Subjective: SOB improved, no palpitations  Objective: Filed Vitals:   10/04/14 0430 10/04/14 0551 10/04/14 0800 10/04/14 0826  BP:  102/47    Pulse:      Temp:   98.1 F (36.7 C)   TempSrc:   Oral   Resp:      Height:      Weight: 76.7 kg (169 lb 1.5 oz)     SpO2:    98%    Intake/Output Summary (Last 24 hours) at 10/04/14 1232 Last data filed at 10/04/14 0100  Gross per 24 hour  Intake    480 ml  Output   1475 ml  Net   -995 ml   Filed Weights   10/02/14 1334 10/04/14 0430  Weight: 77.111 kg (170 lb) 76.7 kg (169 lb 1.5 oz)    Exam:   General:  AA Ox3  Cardiovascular: irregular, no M/R/G  Respiratory: Exp wheezes  Abdomen: S/NT/ND/+BS  Extremities: trace bilateral edema   Neurologic:  Non-focal  Data Reviewed: Basic Metabolic Panel:  Recent Labs Lab 10/02/14 1423 10/03/14 0440 10/04/14 0801  NA 140 141 136  K 4.0 3.6 3.9  CL 105 102 103  CO2 28 31 27   GLUCOSE 162* 138* 125*  BUN 36* 34*  35*  CREATININE 1.64* 1.44* 1.29  CALCIUM 8.6 8.7 8.1*   Liver Function Tests:  Recent Labs Lab 10/03/14 0440  AST 21  ALT 20  ALKPHOS 75  BILITOT 2.4*  PROT 7.0  ALBUMIN 3.8   No results for input(s): LIPASE, AMYLASE in the last 168 hours. No results for input(s): AMMONIA in the last 168 hours. CBC:  Recent Labs Lab 10/02/14 1423 10/03/14 0440 10/04/14 0509  WBC 8.8 10.0 8.7  NEUTROABS 6.9  --   --   HGB 12.1* 12.7* 12.1*  HCT 39.2 40.8 39.1  MCV 97.0 96.7 95.8  PLT 88* 101* 85*   Cardiac Enzymes:  Recent Labs Lab 10/02/14 1423 10/02/14 1724 10/02/14 2255 10/03/14 0440  TROPONINI 0.06* 0.06* 0.08* 0.07*   BNP (last 3 results) No results for input(s): PROBNP in the last 8760 hours. CBG: No results for input(s): GLUCAP in the last 168 hours.  Recent Results (from the past 240 hour(s))  Culture, blood (routine x 2)     Status: None (Preliminary result)   Collection Time: 10/02/14  2:37 PM  Result Value Ref Range Status   Specimen Description BLOOD RIGHT HAND  Final   Special Requests BOTTLES DRAWN AEROBIC ONLY 4CC  Final   Culture NO GROWTH 2 DAYS  Final   Report Status PENDING  Incomplete  Culture, blood (routine x 2)  Status: None (Preliminary result)   Collection Time: 10/02/14  2:38 PM  Result Value Ref Range Status   Specimen Description BLOOD RIGHT HAND  Final   Special Requests   Final    BOTTLES DRAWN AEROBIC AND ANAEROBIC AEB=6CC ANA=4CC   Culture NO GROWTH 2 DAYS  Final   Report Status PENDING  Incomplete  MRSA PCR Screening     Status: None   Collection Time: 10/02/14  6:00 PM  Result Value Ref Range Status   MRSA by PCR NEGATIVE NEGATIVE Final    Comment:        The GeneXpert MRSA Assay (FDA approved for NASAL specimens only), is one component of a comprehensive MRSA colonization surveillance program. It is not intended to diagnose MRSA infection nor to guide or monitor treatment for MRSA infections.      Studies: Dg Chest  2 View  10/04/2014   CLINICAL DATA:  Congestive heart failure. Shortness of breath. Coronary artery disease. Atrial fibrillation.  EXAM: CHEST  2 VIEW  COMPARISON:  10/02/2014  FINDINGS: Heart size remains within normal limits. Pulmonary hyperinflation seen, consistent with COPD. No evidence of pulmonary infiltrate or edema. No evidence of pleural effusion. Prior CABG noted.  IMPRESSION: COPD.  No active disease.   Electronically Signed   By: Earle Gell M.D.   On: 10/04/2014 09:08   Dg Chest Portable 1 View  10/02/2014   CLINICAL DATA:  Shortness of breath.  EXAM: PORTABLE CHEST - 1 VIEW  COMPARISON:  02/06/2012  FINDINGS: Patient is post median sternotomy. Lower lung volumes from prior. The heart is mildly enlarged. There is vascular congestion and questionable mild pulmonary edema. Increased right infrahilar markings, with nodular density in the right lower lung zone. No large pleural effusion. An azygos fissure is again noted. The osseous structures are grossly intact.  IMPRESSION: 1. Findings suggest congestive heart failure. 2. Nodular density in the right lower lung zone, nonspecific. This may reflect a nipple shadow, vessel on-end, versus true pulmonary nodule. Follow up PA and lateral views recommended with nipple markers.   Electronically Signed   By: Jeb Levering M.D.   On: 10/02/2014 14:41    Scheduled Meds: . carvedilol  3.125 mg Oral BID WC  . dextromethorphan-guaiFENesin  1 tablet Oral BID  . diltiazem  30 mg Oral 4 times per day  . furosemide  40 mg Intravenous Q12H  . levalbuterol  0.63 mg Nebulization Q6H  . potassium chloride  30 mEq Oral Once  . potassium chloride SA  20 mEq Oral Daily  . [START ON 10/05/2014] predniSONE  60 mg Oral Q breakfast  . sodium chloride  3 mL Intravenous Q12H  . sodium chloride  3 mL Intravenous Q12H  . warfarin  2.5 mg Oral Once  . Warfarin - Pharmacist Dosing Inpatient   Does not apply Q24H   Continuous Infusions:   Principal Problem:    Atrial fibrillation with rapid ventricular response Active Problems:   Acute on chronic combined systolic and diastolic congestive heart failure   Systolic CHF, chronic   Long term current use of anticoagulant therapy   Coronary atherosclerosis of native coronary artery   Aortic stenosis   CKD (chronic kidney disease) stage 3, GFR 30-59 ml/min    Time spent: 35 minutes. Greater than 50% of this time was spent in direct contact with the patient coordinating care.    Lelon Frohlich  [facility] Pager [phone]  If 7PM-7AM, please contact night-coverage at [email or web address], password [facility] 10/04/2014,  12:32 PM  LOS: 2 days

## 2014-10-04 NOTE — Care Management Note (Signed)
    Page 1 of 1   10/04/2014     12:51:14 PM CARE MANAGEMENT NOTE 10/04/2014  Patient:  Cory Ryan, Cory Ryan   Account Number:  [id number]  Date Initiated:  10/04/2014  Documentation initiated by:  Jolene Provost  Subjective/Objective Assessment:   Pt is from home with self care. Pt has Kasandra Knudsen, lives with son and has no HH services or medication needs prior to admission. Pt plans to discharge home with self care. No CM needs identified at this time.     Action/Plan:   Anticipated DC Date:  10/05/2014   Anticipated DC Plan:  [place]  CM consult      Choice offered to / List presented to:             Status of service:  Completed, signed off Medicare Important Message given?  YES (If response is "NO", the following Medicare IM given date fields will be blank) Date Medicare IM given:  10/04/2014 Medicare IM given by:  Jolene Provost Date Additional Medicare IM given:   Additional Medicare IM given by:    Discharge Disposition:  HOME/SELF CARE  Per UR Regulation:    If discussed at Long Length of Stay Meetings, dates discussed:    Comments:  10/04/2014 [place], RN, MSN, [facility]

## 2014-10-05 DIAGNOSIS — J9801 Acute bronchospasm: Secondary | ICD-10-CM

## 2014-10-05 LAB — CBC
HEMATOCRIT: 43.3 % (ref 39.0–52.0)
Hemoglobin: 13.9 g/dL (ref 13.0–17.0)
MCH: 30.3 pg (ref 26.0–34.0)
MCHC: 32.1 g/dL (ref 30.0–36.0)
MCV: 94.5 fL (ref 78.0–100.0)
Platelets: 105 10*3/uL — ABNORMAL LOW (ref 150–400)
RBC: 4.58 MIL/uL (ref 4.22–5.81)
RDW: 15.2 % (ref 11.5–15.5)
WBC: 10 10*3/uL (ref 4.0–10.5)

## 2014-10-05 LAB — BASIC METABOLIC PANEL
ANION GAP: 8 (ref 5–15)
BUN: 35 mg/dL — AB (ref 6–23)
CHLORIDE: 100 meq/L (ref 96–112)
CO2: 30 mmol/L (ref 19–32)
Calcium: 8.7 mg/dL (ref 8.4–10.5)
Creatinine, Ser: 1.17 mg/dL (ref 0.50–1.35)
GFR calc Af Amer: 69 mL/min — ABNORMAL LOW (ref >90)
GFR calc non Af Amer: 59 mL/min — ABNORMAL LOW (ref >90)
Glucose, Bld: 119 mg/dL — ABNORMAL HIGH (ref 70–99)
POTASSIUM: 3.5 mmol/L (ref 3.5–5.1)
Sodium: 138 mmol/L (ref 135–145)

## 2014-10-05 LAB — PROTIME-INR
INR: 2.33 — AB (ref 0.00–1.49)
PROTHROMBIN TIME: 25.8 s — AB (ref 11.6–15.2)

## 2014-10-05 MED ORDER — CARVEDILOL 3.125 MG PO TABS: 3.1250 mg | ORAL_TABLET | Freq: Two times a day (BID) | ORAL | Status: DC

## 2014-10-05 MED ORDER — PREDNISONE 10 MG PO TABS: 10.0000 mg | ORAL_TABLET | Freq: Every day | ORAL | Status: DC

## 2014-10-05 MED ORDER — DILTIAZEM HCL ER COATED BEADS 120 MG PO CP24: 120.0000 mg | ORAL_CAPSULE | Freq: Every day | ORAL | Status: DC

## 2014-10-05 MED ORDER — WARFARIN SODIUM 5 MG PO TABS: 5.0000 mg | ORAL_TABLET | Freq: Once | ORAL | Status: DC

## 2014-10-05 NOTE — Discharge Summary (Signed)
Physician Discharge Summary  Cory Ryan VOP:[id number] DOB: May 17, 1941 DOA: 10/02/2014  PCP: Collene Mares, PA-C  Admit date: 10/02/2014 Discharge date: 10/05/2014  Time spent: 45 minutes  Recommendations for Outpatient Follow-up:  -Will be discharged home today. -Advised to follow up with PCP in 2 weeks.   Discharge Diagnoses:  Principal Problem:   Atrial fibrillation with rapid ventricular response Active Problems:   Acute on chronic combined systolic and diastolic congestive heart failure   Systolic CHF, chronic   Long term current use of anticoagulant therapy   Coronary atherosclerosis of native coronary artery   Aortic stenosis   CKD (chronic kidney disease) stage 3, GFR 30-59 ml/min   Bronchospasm   Discharge Condition: Stable and improved  Filed Weights   10/02/14 1334 10/04/14 0430  Weight: 77.111 kg (170 lb) 76.7 kg (169 lb 1.5 oz)    History of present illness:  This is a 17 old man who gives a two-week history of increasing dyspnea. He denies any chest pain. There has been a cough productive of whitish sputum. There has been no fever. He was seen by his primary care physician 5 days ago and was given steroids. He does have a history of atrial fibrillation, on chronic Coumadin therapy as well as a history of systolic heart failure with ejection fraction of 40-45% and moderate aortic stenosis on his last echocardiogram in September of this year. He does have a history of coronary artery disease, status post CABG in the past. He denies any ankle swelling. Evaluation in the emergency room shows him to be in atrial fibrillation with rapid ventricle response and also in congestive heart failure. He is now being admitted for further management.  Hospital Course:   Atrial fibrillation with rapid ventricular response -Rates improved to the low 100s on PO cardizem. -Will transition to long-acting cardizem on DC for ease of administration. -Anticoagulated on  coumadin. -We'll avoid beta blocker titration given wheezing on lung exam.  Wheezing/Bronchospasm -No formal diagnosis of COPD. -Marked improvement overnight after one dose of prednisone. -Initiate steroid taper on DC.  Acute on chronic combined CHF -Likely related to dietary issues as well as  noncompliance with Lasix. -Is 3.0 L negative since admission. -Continue lasix 40 mg daily on DC.  Chronic kidney disease stage III -Creatinine better than baseline of between 1.5-1.6 at 1.17 on DC.  Procedures:  None   Consultations:  Cardiology, Dr. Harl Bowie  Discharge Instructions  Discharge Instructions    Diet - low sodium heart healthy    Complete by:  As directed      Increase activity slowly    Complete by:  As directed             Medication List    STOP taking these medications        MILK OF MAGNESIA PO      TAKE these medications        ALPRAZolam 1 MG tablet  Commonly known as:  XANAX  Take 1 mg by mouth as needed for anxiety.     carvedilol 3.125 MG tablet  Commonly known as:  COREG  Take 1 tablet (3.125 mg total) by mouth 2 (two) times daily with a meal.     Dextromethorphan-Guaifenesin 60-1200 MG per 12 hr tablet  Take 1 tablet by mouth every 12 (twelve) hours.     diltiazem 120 MG 24 hr capsule  Commonly known as:  CARDIZEM CD  Take 1 capsule (120 mg total) by mouth  daily.     furosemide 40 MG tablet  Commonly known as:  LASIX  Take 1 tablet (40 mg total) by mouth daily.     HYDROcodone-acetaminophen 5-325 MG per tablet  Commonly known as:  NORCO  Take 1-2 tablets by mouth every 4 (four) hours as needed.     Ipratropium-Albuterol 20-100 MCG/ACT Aers respimat  Commonly known as:  COMBIVENT RESPIMAT  Inhale 1 puff into the lungs as needed.     loperamide 2 MG capsule  Commonly known as:  IMODIUM  Take 1 capsule (2 mg total) by mouth as needed for diarrhea or loose stools.     potassium chloride SA 20 MEQ tablet  Commonly known as:   K-DUR,KLOR-CON  Take 20 mEq by mouth.     predniSONE 10 MG tablet  Commonly known as:  DELTASONE  Take 1 tablet (10 mg total) by mouth daily with breakfast. Take 6 tablets today and then decrease by 1 tablet daily until none are left     warfarin 5 MG tablet  Commonly known as:  COUMADIN  Take 1 tablet daily except 1/2 tablet on Sundays, Tuesdays and Thursdays       No Known Allergies     Follow-up Information    Follow up with MANN, BENJAMIN, PA-C. Schedule an appointment as soon as possible for a visit in 2 weeks.   Specialties:  Physician Assistant, Internal Medicine   Contact information:   [address] [place] [address] [phone]        The results of significant diagnostics from this hospitalization (including imaging, microbiology, ancillary and laboratory) are listed below for reference.    Significant Diagnostic Studies: Dg Chest 2 View  10/04/2014   CLINICAL DATA:  Congestive heart failure. Shortness of breath. Coronary artery disease. Atrial fibrillation.  EXAM: CHEST  2 VIEW  COMPARISON:  10/02/2014  FINDINGS: Heart size remains within normal limits. Pulmonary hyperinflation seen, consistent with COPD. No evidence of pulmonary infiltrate or edema. No evidence of pleural effusion. Prior CABG noted.  IMPRESSION: COPD.  No active disease.   Electronically Signed   By: Earle Gell M.D.   On: 10/04/2014 09:08   Dg Chest Portable 1 View  10/02/2014   CLINICAL DATA:  Shortness of breath.  EXAM: PORTABLE CHEST - 1 VIEW  COMPARISON:  02/06/2012  FINDINGS: Patient is post median sternotomy. Lower lung volumes from prior. The heart is mildly enlarged. There is vascular congestion and questionable mild pulmonary edema. Increased right infrahilar markings, with nodular density in the right lower lung zone. No large pleural effusion. An azygos fissure is again noted. The osseous structures are grossly intact.  IMPRESSION: 1. Findings suggest congestive heart failure.  2. Nodular density in the right lower lung zone, nonspecific. This may reflect a nipple shadow, vessel on-end, versus true pulmonary nodule. Follow up PA and lateral views recommended with nipple markers.   Electronically Signed   By: Jeb Levering M.D.   On: 10/02/2014 14:41    Microbiology: Recent Results (from the past 240 hour(s))  Culture, blood (routine x 2)     Status: None (Preliminary result)   Collection Time: 10/02/14  2:37 PM  Result Value Ref Range Status   Specimen Description BLOOD RIGHT HAND  Final   Special Requests BOTTLES DRAWN AEROBIC ONLY 4CC  Final   Culture NO GROWTH 2 DAYS  Final   Report Status PENDING  Incomplete  Culture, blood (routine x 2)     Status: None (Preliminary  result)   Collection Time: 10/02/14  2:38 PM  Result Value Ref Range Status   Specimen Description BLOOD RIGHT HAND  Final   Special Requests   Final    BOTTLES DRAWN AEROBIC AND ANAEROBIC AEB=6CC ANA=4CC   Culture NO GROWTH 2 DAYS  Final   Report Status PENDING  Incomplete  MRSA PCR Screening     Status: None   Collection Time: 10/02/14  6:00 PM  Result Value Ref Range Status   MRSA by PCR NEGATIVE NEGATIVE Final    Comment:        The GeneXpert MRSA Assay (FDA approved for NASAL specimens only), is one component of a comprehensive MRSA colonization surveillance program. It is not intended to diagnose MRSA infection nor to guide or monitor treatment for MRSA infections.      Labs: Basic Metabolic Panel:  Recent Labs Lab 10/02/14 1423 10/03/14 0440 10/04/14 0801 10/05/14 0542  NA 140 141 136 138  K 4.0 3.6 3.9 3.5  CL 105 102 103 100  CO2 28 31 27 30   GLUCOSE 162* 138* 125* 119*  BUN 36* 34* 35* 35*  CREATININE 1.64* 1.44* 1.29 1.17  CALCIUM 8.6 8.7 8.1* 8.7   Liver Function Tests:  Recent Labs Lab 10/03/14 0440  AST 21  ALT 20  ALKPHOS 75  BILITOT 2.4*  PROT 7.0  ALBUMIN 3.8   No results for input(s): LIPASE, AMYLASE in the last 168 hours. No results  for input(s): AMMONIA in the last 168 hours. CBC:  Recent Labs Lab 10/02/14 1423 10/03/14 0440 10/04/14 0509 10/05/14 0542  WBC 8.8 10.0 8.7 10.0  NEUTROABS 6.9  --   --   --   HGB 12.1* 12.7* 12.1* 13.9  HCT 39.2 40.8 39.1 43.3  MCV 97.0 96.7 95.8 94.5  PLT 88* 101* 85* 105*   Cardiac Enzymes:  Recent Labs Lab 10/02/14 1423 10/02/14 1724 10/02/14 2255 10/03/14 0440  TROPONINI 0.06* 0.06* 0.08* 0.07*   BNP: BNP (last 3 results) No results for input(s): PROBNP in the last 8760 hours. CBG: No results for input(s): GLUCAP in the last 168 hours.     SignedLelon Frohlich  [facility] Pager: [phone] 10/05/2014, 1:16 PM

## 2014-10-05 NOTE — Progress Notes (Signed)
ANTICOAGULATION CONSULT NOTE  Pharmacy Consult for Coumadin Indication: atrial fibrillation  No Known Allergies  Patient Measurements: Height: 5\' 10"  (177.8 cm) Weight: 169 lb 1.5 oz (76.7 kg) IBW/kg (Calculated) : 73  Vital Signs: Temp: 97.6 F (36.4 C) (12/25 0403) Temp Source: Oral (12/25 0403) BP: 129/82 mmHg (12/25 0403) Pulse Rate: 97 (12/25 0403)  Labs:  Recent Labs  10/02/14 1724 10/02/14 2255 10/03/14 0440 10/04/14 0509 10/04/14 0801 10/05/14 0542  HGB  --   --  12.7* 12.1*  --  13.9  HCT  --   --  40.8 39.1  --  43.3  PLT  --   --  101* 85*  --  105*  LABPROT  --   --  30.0* 27.9*  --  25.8*  INR  --   --  2.84* 2.58*  --  2.33*  CREATININE  --   --  1.44*  --  1.29 1.17  TROPONINI 0.06* 0.08* 0.07*  --   --   --    Estimated Creatinine Clearance: 56.3 mL/min (by C-G formula based on Cr of 1.17).  Medications:  Prescriptions prior to admission  Medication Sig Dispense Refill Last Dose  . ALPRAZolam (XANAX) 1 MG tablet Take 1 mg by mouth as needed for anxiety.    unknown  . carvedilol (COREG) 6.25 MG tablet TAKE (1) TABLET BY MOUTH TWICE DAILY WITH A MEAL. 60 tablet 6 10/02/2014 at 0900  . Dextromethorphan-Guaifenesin 60-1200 MG per 12 hr tablet Take 1 tablet by mouth every 12 (twelve) hours. 60 tablet 0 10/02/2014 at Unknown time  . furosemide (LASIX) 40 MG tablet Take 1 tablet (40 mg total) by mouth daily. 90 tablet 3 10/02/2014 at Unknown time  . HYDROcodone-acetaminophen (NORCO) 5-325 MG per tablet Take 1-2 tablets by mouth every 4 (four) hours as needed. (Patient taking differently: Take 1 tablet by mouth every 6 (six) hours as needed for moderate pain. ) 40 tablet 0 unknown  . Ipratropium-Albuterol (COMBIVENT RESPIMAT) 20-100 MCG/ACT AERS respimat Inhale 1 puff into the lungs as needed. (Patient taking differently: Inhale 1 puff into the lungs as needed for wheezing or shortness of breath. ) 1 Inhaler 1 unknown  . loperamide (IMODIUM) 2 MG capsule Take 1  capsule (2 mg total) by mouth as needed for diarrhea or loose stools. 20 capsule 2 unknown  . Magnesium Hydroxide (MILK OF MAGNESIA PO) Take 30 mLs by mouth as needed (stomach pain).    unknown  . potassium chloride SA (K-DUR,KLOR-CON) 20 MEQ tablet Take 20 mEq by mouth.    10/02/2014 at Unknown time  . warfarin (COUMADIN) 5 MG tablet Take 1 tablet daily except 1/2 tablet on Sundays, Tuesdays and Thursdays (Patient taking differently: Take 2.5-5 mg by mouth See admin instructions. Take 1 tablet daily except 1/2 tablet on Sundays, Tuesdays and Thursdays) 30 tablet 6 10/02/2014 at Unknown time   Assessment: 73yo male on chronic Coumadin PTA for h/o afib.  Home dose listed above.   INR was supratherapeutic on admission but is now within goal range.   Her outpatient flow sheet, Coumadin dose was decreased on 12/9 to 2.5mg  daily except 5mg  on Mon, Wed, Fri.   No bleeding noted.   Platelet count improved.   Goal of Therapy:  INR 2-3   Plan:   Coumadin 5mg  po x 1 today (per home regimen)  INR daily until stable.  Cory Ryan 10/05/2014,9:56 AM

## 2014-10-07 LAB — CULTURE, BLOOD (ROUTINE X 2)
CULTURE: NO GROWTH
Culture: NO GROWTH

## 2014-10-10 ENCOUNTER — Ambulatory Visit (INDEPENDENT_AMBULATORY_CARE_PROVIDER_SITE_OTHER): Payer: Medicare Other | Admitting: *Deleted

## 2014-10-10 DIAGNOSIS — Z5181 Encounter for therapeutic drug level monitoring: Secondary | ICD-10-CM

## 2014-10-10 DIAGNOSIS — Z7901 Long term (current) use of anticoagulants: Secondary | ICD-10-CM

## 2014-10-10 DIAGNOSIS — I4891 Unspecified atrial fibrillation: Secondary | ICD-10-CM

## 2014-10-10 LAB — POCT INR: INR: 1.7

## 2014-10-15 ENCOUNTER — Ambulatory Visit (INDEPENDENT_AMBULATORY_CARE_PROVIDER_SITE_OTHER): Payer: Medicare Other | Admitting: *Deleted

## 2014-10-15 DIAGNOSIS — Z5181 Encounter for therapeutic drug level monitoring: Secondary | ICD-10-CM

## 2014-10-15 DIAGNOSIS — Z7901 Long term (current) use of anticoagulants: Secondary | ICD-10-CM

## 2014-10-15 DIAGNOSIS — I4891 Unspecified atrial fibrillation: Secondary | ICD-10-CM

## 2014-10-15 LAB — POCT INR: INR: 3.4

## 2014-10-19 ENCOUNTER — Other Ambulatory Visit: Payer: Self-pay | Admitting: *Deleted

## 2014-10-19 MED ORDER — CARVEDILOL 3.125 MG PO TABS: 3.1250 mg | ORAL_TABLET | Freq: Two times a day (BID) | ORAL | Status: DC

## 2014-10-19 NOTE — Telephone Encounter (Signed)
Carvedilol twice dailey needs called in to [facility]

## 2014-10-24 ENCOUNTER — Ambulatory Visit (INDEPENDENT_AMBULATORY_CARE_PROVIDER_SITE_OTHER): Payer: Self-pay | Admitting: *Deleted

## 2014-10-24 DIAGNOSIS — Z7901 Long term (current) use of anticoagulants: Secondary | ICD-10-CM

## 2014-10-24 DIAGNOSIS — I4891 Unspecified atrial fibrillation: Secondary | ICD-10-CM

## 2014-10-24 DIAGNOSIS — Z5181 Encounter for therapeutic drug level monitoring: Secondary | ICD-10-CM

## 2014-10-24 LAB — POCT INR: INR: 2.5

## 2014-10-29 ENCOUNTER — Other Ambulatory Visit (HOSPITAL_COMMUNITY): Payer: Self-pay | Admitting: Family Medicine

## 2014-10-29 ENCOUNTER — Ambulatory Visit (HOSPITAL_COMMUNITY)
Admission: RE | Admit: 2014-10-29 | Discharge: 2014-10-29 | Disposition: A | Discharge status: Home / Self Care | Payer: Medicare Other | Source: Ambulatory Visit | Attending: Family Medicine | Admitting: Family Medicine

## 2014-10-29 DIAGNOSIS — I509 Heart failure, unspecified: Secondary | ICD-10-CM

## 2014-10-29 DIAGNOSIS — I517 Cardiomegaly: Secondary | ICD-10-CM | POA: Diagnosis not present

## 2014-10-29 DIAGNOSIS — R05 Cough: Secondary | ICD-10-CM | POA: Diagnosis not present

## 2014-10-29 DIAGNOSIS — R059 Cough, unspecified: Secondary | ICD-10-CM

## 2014-10-29 DIAGNOSIS — J449 Chronic obstructive pulmonary disease, unspecified: Secondary | ICD-10-CM | POA: Diagnosis not present

## 2014-10-29 DIAGNOSIS — R0602 Shortness of breath: Secondary | ICD-10-CM | POA: Diagnosis not present

## 2014-10-29 DIAGNOSIS — Z87891 Personal history of nicotine dependence: Secondary | ICD-10-CM | POA: Diagnosis not present

## 2014-11-14 ENCOUNTER — Ambulatory Visit (INDEPENDENT_AMBULATORY_CARE_PROVIDER_SITE_OTHER): Payer: Medicare Other | Admitting: *Deleted

## 2014-11-14 DIAGNOSIS — Z5181 Encounter for therapeutic drug level monitoring: Secondary | ICD-10-CM

## 2014-11-14 DIAGNOSIS — I4891 Unspecified atrial fibrillation: Secondary | ICD-10-CM

## 2014-11-14 DIAGNOSIS — Z7901 Long term (current) use of anticoagulants: Secondary | ICD-10-CM

## 2014-11-14 LAB — POCT INR: INR: 1.9

## 2014-12-05 ENCOUNTER — Ambulatory Visit (INDEPENDENT_AMBULATORY_CARE_PROVIDER_SITE_OTHER): Payer: Medicare Other | Admitting: *Deleted

## 2014-12-05 DIAGNOSIS — Z7901 Long term (current) use of anticoagulants: Secondary | ICD-10-CM

## 2014-12-05 DIAGNOSIS — I4891 Unspecified atrial fibrillation: Secondary | ICD-10-CM

## 2014-12-05 DIAGNOSIS — Z5181 Encounter for therapeutic drug level monitoring: Secondary | ICD-10-CM

## 2014-12-05 DIAGNOSIS — I480 Paroxysmal atrial fibrillation: Secondary | ICD-10-CM

## 2014-12-05 LAB — POCT INR: INR: 1.8

## 2014-12-31 ENCOUNTER — Ambulatory Visit (INDEPENDENT_AMBULATORY_CARE_PROVIDER_SITE_OTHER): Payer: Medicare Other | Admitting: *Deleted

## 2014-12-31 DIAGNOSIS — Z5181 Encounter for therapeutic drug level monitoring: Secondary | ICD-10-CM

## 2014-12-31 DIAGNOSIS — I480 Paroxysmal atrial fibrillation: Secondary | ICD-10-CM

## 2014-12-31 LAB — POCT INR: INR: 1.6

## 2015-01-16 ENCOUNTER — Ambulatory Visit (INDEPENDENT_AMBULATORY_CARE_PROVIDER_SITE_OTHER): Payer: Medicare Other | Admitting: *Deleted

## 2015-01-16 DIAGNOSIS — I480 Paroxysmal atrial fibrillation: Secondary | ICD-10-CM | POA: Diagnosis not present

## 2015-01-16 DIAGNOSIS — Z5181 Encounter for therapeutic drug level monitoring: Secondary | ICD-10-CM

## 2015-01-16 LAB — POCT INR: INR: 2.2

## 2015-01-16 MED ORDER — DILTIAZEM HCL ER COATED BEADS 120 MG PO CP24: 120.0000 mg | ORAL_CAPSULE | Freq: Every day | ORAL | Status: DC

## 2015-02-13 ENCOUNTER — Ambulatory Visit (INDEPENDENT_AMBULATORY_CARE_PROVIDER_SITE_OTHER): Payer: Medicare Other | Admitting: *Deleted

## 2015-02-13 DIAGNOSIS — I480 Paroxysmal atrial fibrillation: Secondary | ICD-10-CM

## 2015-02-13 DIAGNOSIS — I4891 Unspecified atrial fibrillation: Secondary | ICD-10-CM | POA: Diagnosis not present

## 2015-02-13 DIAGNOSIS — Z5181 Encounter for therapeutic drug level monitoring: Secondary | ICD-10-CM | POA: Diagnosis not present

## 2015-02-13 LAB — POCT INR: INR: 1.6

## 2015-02-13 MED ORDER — WARFARIN SODIUM 5 MG PO TABS: ORAL_TABLET | ORAL | Status: DC

## 2015-02-27 ENCOUNTER — Ambulatory Visit (INDEPENDENT_AMBULATORY_CARE_PROVIDER_SITE_OTHER): Payer: Medicare Other | Admitting: *Deleted

## 2015-02-27 DIAGNOSIS — Z5181 Encounter for therapeutic drug level monitoring: Secondary | ICD-10-CM

## 2015-02-27 DIAGNOSIS — I4891 Unspecified atrial fibrillation: Secondary | ICD-10-CM

## 2015-02-27 DIAGNOSIS — I480 Paroxysmal atrial fibrillation: Secondary | ICD-10-CM

## 2015-02-27 LAB — POCT INR: INR: 2.1

## 2015-03-27 ENCOUNTER — Ambulatory Visit (INDEPENDENT_AMBULATORY_CARE_PROVIDER_SITE_OTHER): Payer: Medicare Other | Admitting: *Deleted

## 2015-03-27 DIAGNOSIS — Z5181 Encounter for therapeutic drug level monitoring: Secondary | ICD-10-CM | POA: Diagnosis not present

## 2015-03-27 DIAGNOSIS — I480 Paroxysmal atrial fibrillation: Secondary | ICD-10-CM | POA: Diagnosis not present

## 2015-03-27 DIAGNOSIS — I4891 Unspecified atrial fibrillation: Secondary | ICD-10-CM

## 2015-03-27 LAB — POCT INR: INR: 2.2

## 2015-04-05 ENCOUNTER — Other Ambulatory Visit: Payer: Self-pay | Admitting: Adult Health

## 2015-04-09 ENCOUNTER — Other Ambulatory Visit: Payer: Self-pay | Admitting: Adult Health

## 2015-04-23 ENCOUNTER — Encounter: Payer: Self-pay | Admitting: *Deleted

## 2015-04-24 ENCOUNTER — Ambulatory Visit (INDEPENDENT_AMBULATORY_CARE_PROVIDER_SITE_OTHER): Payer: Medicare Other | Admitting: *Deleted

## 2015-04-24 DIAGNOSIS — Z5181 Encounter for therapeutic drug level monitoring: Secondary | ICD-10-CM

## 2015-04-24 DIAGNOSIS — I480 Paroxysmal atrial fibrillation: Secondary | ICD-10-CM | POA: Diagnosis not present

## 2015-04-24 LAB — POCT INR: INR: 1.3

## 2015-04-29 ENCOUNTER — Encounter: Payer: Self-pay | Admitting: Internal Medicine

## 2015-04-29 ENCOUNTER — Ambulatory Visit (INDEPENDENT_AMBULATORY_CARE_PROVIDER_SITE_OTHER): Payer: Medicare Other | Admitting: *Deleted

## 2015-04-29 DIAGNOSIS — Z5181 Encounter for therapeutic drug level monitoring: Secondary | ICD-10-CM

## 2015-04-29 DIAGNOSIS — I480 Paroxysmal atrial fibrillation: Secondary | ICD-10-CM | POA: Diagnosis not present

## 2015-04-29 DIAGNOSIS — I4891 Unspecified atrial fibrillation: Secondary | ICD-10-CM | POA: Diagnosis not present

## 2015-04-29 LAB — POCT INR: INR: 2.6

## 2015-05-06 ENCOUNTER — Encounter: Payer: Self-pay | Admitting: Internal Medicine

## 2015-05-07 ENCOUNTER — Encounter: Payer: Self-pay | Admitting: Internal Medicine

## 2015-05-15 ENCOUNTER — Ambulatory Visit (INDEPENDENT_AMBULATORY_CARE_PROVIDER_SITE_OTHER): Payer: Medicare Other | Admitting: *Deleted

## 2015-05-15 ENCOUNTER — Encounter: Payer: Self-pay | Admitting: Internal Medicine

## 2015-05-15 DIAGNOSIS — I480 Paroxysmal atrial fibrillation: Secondary | ICD-10-CM | POA: Diagnosis not present

## 2015-05-15 DIAGNOSIS — I4891 Unspecified atrial fibrillation: Secondary | ICD-10-CM | POA: Diagnosis not present

## 2015-05-15 DIAGNOSIS — Z5181 Encounter for therapeutic drug level monitoring: Secondary | ICD-10-CM

## 2015-05-15 LAB — POCT INR: INR: 2

## 2015-06-21 ENCOUNTER — Ambulatory Visit (INDEPENDENT_AMBULATORY_CARE_PROVIDER_SITE_OTHER): Payer: Medicare Other | Admitting: *Deleted

## 2015-06-21 DIAGNOSIS — Z5181 Encounter for therapeutic drug level monitoring: Secondary | ICD-10-CM | POA: Diagnosis not present

## 2015-06-21 DIAGNOSIS — I480 Paroxysmal atrial fibrillation: Secondary | ICD-10-CM | POA: Diagnosis not present

## 2015-06-21 LAB — POCT INR: INR: 1.7

## 2015-07-10 ENCOUNTER — Ambulatory Visit (INDEPENDENT_AMBULATORY_CARE_PROVIDER_SITE_OTHER): Payer: Medicare Other | Admitting: *Deleted

## 2015-07-10 DIAGNOSIS — I480 Paroxysmal atrial fibrillation: Secondary | ICD-10-CM

## 2015-07-10 DIAGNOSIS — I4891 Unspecified atrial fibrillation: Secondary | ICD-10-CM

## 2015-07-10 DIAGNOSIS — Z5181 Encounter for therapeutic drug level monitoring: Secondary | ICD-10-CM

## 2015-07-10 LAB — POCT INR: INR: 2.2

## 2015-08-06 ENCOUNTER — Other Ambulatory Visit: Payer: Self-pay | Admitting: Adult Health

## 2015-08-07 ENCOUNTER — Ambulatory Visit (INDEPENDENT_AMBULATORY_CARE_PROVIDER_SITE_OTHER): Payer: Medicare Other | Admitting: *Deleted

## 2015-08-07 DIAGNOSIS — I480 Paroxysmal atrial fibrillation: Secondary | ICD-10-CM

## 2015-08-07 DIAGNOSIS — I4891 Unspecified atrial fibrillation: Secondary | ICD-10-CM | POA: Diagnosis not present

## 2015-08-07 DIAGNOSIS — Z5181 Encounter for therapeutic drug level monitoring: Secondary | ICD-10-CM

## 2015-08-07 LAB — POCT INR: INR: 2.5

## 2015-08-08 ENCOUNTER — Inpatient Hospital Stay (HOSPITAL_COMMUNITY)
Admission: EM | Admit: 2015-08-08 | Discharge: 2015-08-11 | DRG: 309 | Disposition: A | Discharge status: Home / Self Care | Payer: Medicare Other | Attending: Cardiology | Admitting: Cardiology

## 2015-08-08 ENCOUNTER — Emergency Department (HOSPITAL_COMMUNITY): Payer: Medicare Other

## 2015-08-08 ENCOUNTER — Encounter (HOSPITAL_COMMUNITY): Payer: Self-pay | Admitting: Emergency Medicine

## 2015-08-08 DIAGNOSIS — I248 Other forms of acute ischemic heart disease: Secondary | ICD-10-CM | POA: Diagnosis not present

## 2015-08-08 DIAGNOSIS — I252 Old myocardial infarction: Secondary | ICD-10-CM | POA: Diagnosis not present

## 2015-08-08 DIAGNOSIS — K219 Gastro-esophageal reflux disease without esophagitis: Secondary | ICD-10-CM | POA: Diagnosis present

## 2015-08-08 DIAGNOSIS — Z85038 Personal history of other malignant neoplasm of large intestine: Secondary | ICD-10-CM | POA: Diagnosis not present

## 2015-08-08 DIAGNOSIS — I482 Chronic atrial fibrillation, unspecified: Secondary | ICD-10-CM | POA: Diagnosis present

## 2015-08-08 DIAGNOSIS — Z7901 Long term (current) use of anticoagulants: Secondary | ICD-10-CM

## 2015-08-08 DIAGNOSIS — F419 Anxiety disorder, unspecified: Secondary | ICD-10-CM | POA: Diagnosis present

## 2015-08-08 DIAGNOSIS — I255 Ischemic cardiomyopathy: Secondary | ICD-10-CM | POA: Diagnosis not present

## 2015-08-08 DIAGNOSIS — I481 Persistent atrial fibrillation: Secondary | ICD-10-CM

## 2015-08-08 DIAGNOSIS — D696 Thrombocytopenia, unspecified: Secondary | ICD-10-CM | POA: Diagnosis present

## 2015-08-08 DIAGNOSIS — N183 Chronic kidney disease, stage 3 (moderate): Secondary | ICD-10-CM | POA: Diagnosis present

## 2015-08-08 DIAGNOSIS — I080 Rheumatic disorders of both mitral and aortic valves: Secondary | ICD-10-CM | POA: Diagnosis not present

## 2015-08-08 DIAGNOSIS — I214 Non-ST elevation (NSTEMI) myocardial infarction: Secondary | ICD-10-CM

## 2015-08-08 DIAGNOSIS — I2510 Atherosclerotic heart disease of native coronary artery without angina pectoris: Secondary | ICD-10-CM | POA: Diagnosis present

## 2015-08-08 DIAGNOSIS — Z87891 Personal history of nicotine dependence: Secondary | ICD-10-CM

## 2015-08-08 DIAGNOSIS — I5033 Acute on chronic diastolic (congestive) heart failure: Secondary | ICD-10-CM

## 2015-08-08 DIAGNOSIS — R0600 Dyspnea, unspecified: Secondary | ICD-10-CM | POA: Diagnosis present

## 2015-08-08 DIAGNOSIS — Z951 Presence of aortocoronary bypass graft: Secondary | ICD-10-CM | POA: Diagnosis not present

## 2015-08-08 DIAGNOSIS — Z79899 Other long term (current) drug therapy: Secondary | ICD-10-CM

## 2015-08-08 DIAGNOSIS — E039 Hypothyroidism, unspecified: Secondary | ICD-10-CM | POA: Diagnosis not present

## 2015-08-08 DIAGNOSIS — I5042 Chronic combined systolic (congestive) and diastolic (congestive) heart failure: Secondary | ICD-10-CM | POA: Diagnosis present

## 2015-08-08 DIAGNOSIS — I4891 Unspecified atrial fibrillation: Secondary | ICD-10-CM | POA: Insufficient documentation

## 2015-08-08 DIAGNOSIS — J449 Chronic obstructive pulmonary disease, unspecified: Secondary | ICD-10-CM | POA: Diagnosis present

## 2015-08-08 DIAGNOSIS — R7989 Other specified abnormal findings of blood chemistry: Secondary | ICD-10-CM

## 2015-08-08 LAB — CBC
HEMATOCRIT: 40.7 % (ref 39.0–52.0)
Hemoglobin: 13.2 g/dL (ref 13.0–17.0)
MCH: 30.9 pg (ref 26.0–34.0)
MCHC: 32.4 g/dL (ref 30.0–36.0)
MCV: 95.3 fL (ref 78.0–100.0)
PLATELETS: 63 10*3/uL — AB (ref 150–400)
RBC: 4.27 MIL/uL (ref 4.22–5.81)
RDW: 14.7 % (ref 11.5–15.5)
WBC: 5.7 10*3/uL (ref 4.0–10.5)

## 2015-08-08 LAB — TROPONIN I
Troponin I: 1.5 ng/mL (ref <0.031)
Troponin I: 1.67 ng/mL (ref <0.031)

## 2015-08-08 LAB — BASIC METABOLIC PANEL
Anion gap: 9 (ref 5–15)
BUN: 21 mg/dL — ABNORMAL HIGH (ref 6–20)
CHLORIDE: 105 mmol/L (ref 101–111)
CO2: 26 mmol/L (ref 22–32)
Calcium: 9.6 mg/dL (ref 8.9–10.3)
Creatinine, Ser: 1.38 mg/dL — ABNORMAL HIGH (ref 0.61–1.24)
GFR calc non Af Amer: 48 mL/min — ABNORMAL LOW (ref >60)
GFR, EST AFRICAN AMERICAN: 56 mL/min — AB (ref >60)
Glucose, Bld: 118 mg/dL — ABNORMAL HIGH (ref 65–99)
POTASSIUM: 4.5 mmol/L (ref 3.5–5.1)
SODIUM: 140 mmol/L (ref 135–145)

## 2015-08-08 LAB — PROTIME-INR
INR: 2.17 — AB (ref 0.00–1.49)
PROTHROMBIN TIME: 24 s — AB (ref 11.6–15.2)

## 2015-08-08 LAB — BRAIN NATRIURETIC PEPTIDE: B Natriuretic Peptide: 725 pg/mL — ABNORMAL HIGH (ref 0.0–100.0)

## 2015-08-08 MED ORDER — WARFARIN - PHARMACIST DOSING INPATIENT
Freq: Every day | Status: DC
Start: 2015-08-09 — End: 2015-08-11

## 2015-08-08 MED ORDER — ASPIRIN EC 81 MG PO TBEC
81.0000 mg | DELAYED_RELEASE_TABLET | Freq: Every day | ORAL | Status: DC
  Administered 2015-08-09 – 2015-08-11 (×3): 81 mg via ORAL
  Filled 2015-08-08 (×3): qty 1

## 2015-08-08 MED ORDER — HYDROCODONE-ACETAMINOPHEN 5-325 MG PO TABS: 1.0000 | ORAL_TABLET | Freq: Four times a day (QID) | ORAL | Status: DC | PRN

## 2015-08-08 MED ORDER — IPRATROPIUM-ALBUTEROL 0.5-2.5 (3) MG/3ML IN SOLN
3.0000 mL | Freq: Once | RESPIRATORY_TRACT | Status: AC
  Administered 2015-08-08: 3 mL via RESPIRATORY_TRACT

## 2015-08-08 MED ORDER — ONDANSETRON HCL 4 MG/2ML IJ SOLN: 4.0000 mg | Freq: Four times a day (QID) | INTRAMUSCULAR | Status: DC | PRN

## 2015-08-08 MED ORDER — ALBUTEROL SULFATE (2.5 MG/3ML) 0.083% IN NEBU
INHALATION_SOLUTION | RESPIRATORY_TRACT | Status: AC
  Administered 2015-08-08: 2.5 mg via RESPIRATORY_TRACT
  Filled 2015-08-08: qty 3

## 2015-08-08 MED ORDER — FUROSEMIDE 10 MG/ML IJ SOLN
40.0000 mg | Freq: Once | INTRAMUSCULAR | Status: AC
  Administered 2015-08-08: 40 mg via INTRAVENOUS
  Filled 2015-08-08: qty 4

## 2015-08-08 MED ORDER — IPRATROPIUM BROMIDE 0.02 % IN SOLN: 0.5000 mg | Freq: Once | RESPIRATORY_TRACT | Status: DC

## 2015-08-08 MED ORDER — ASPIRIN 81 MG PO CHEW
324.0000 mg | CHEWABLE_TABLET | Freq: Once | ORAL | Status: AC
  Administered 2015-08-08: 324 mg via ORAL
  Filled 2015-08-08: qty 4

## 2015-08-08 MED ORDER — DILTIAZEM LOAD VIA INFUSION
10.0000 mg | Freq: Once | INTRAVENOUS | Status: AC
  Administered 2015-08-08: 10 mg via INTRAVENOUS
  Filled 2015-08-08: qty 10

## 2015-08-08 MED ORDER — POTASSIUM CHLORIDE CRYS ER 20 MEQ PO TBCR
20.0000 meq | EXTENDED_RELEASE_TABLET | Freq: Every day | ORAL | Status: DC
  Administered 2015-08-09 – 2015-08-11 (×3): 20 meq via ORAL
  Filled 2015-08-08 (×3): qty 1

## 2015-08-08 MED ORDER — ALBUTEROL SULFATE (2.5 MG/3ML) 0.083% IN NEBU: 5.0000 mg | INHALATION_SOLUTION | Freq: Once | RESPIRATORY_TRACT | Status: DC

## 2015-08-08 MED ORDER — CARVEDILOL 3.125 MG PO TABS
3.1250 mg | ORAL_TABLET | Freq: Two times a day (BID) | ORAL | Status: DC
  Administered 2015-08-09: 3.125 mg via ORAL
  Filled 2015-08-08: qty 1

## 2015-08-08 MED ORDER — ALBUTEROL SULFATE (2.5 MG/3ML) 0.083% IN NEBU
2.5000 mg | INHALATION_SOLUTION | Freq: Once | RESPIRATORY_TRACT | Status: AC
  Administered 2015-08-08: 2.5 mg via RESPIRATORY_TRACT

## 2015-08-08 MED ORDER — IPRATROPIUM-ALBUTEROL 0.5-2.5 (3) MG/3ML IN SOLN
RESPIRATORY_TRACT | Status: AC
  Administered 2015-08-08: 3 mL via RESPIRATORY_TRACT
  Filled 2015-08-08: qty 3

## 2015-08-08 MED ORDER — IPRATROPIUM-ALBUTEROL 0.5-2.5 (3) MG/3ML IN SOLN
3.0000 mL | RESPIRATORY_TRACT | Status: DC | PRN
  Administered 2015-08-09 – 2015-08-11 (×6): 3 mL via RESPIRATORY_TRACT
  Filled 2015-08-08 (×6): qty 3

## 2015-08-08 MED ORDER — ACETAMINOPHEN 325 MG PO TABS
650.0000 mg | ORAL_TABLET | ORAL | Status: DC | PRN
  Administered 2015-08-10: 650 mg via ORAL
  Filled 2015-08-08: qty 2

## 2015-08-08 MED ORDER — WARFARIN SODIUM 5 MG PO TABS
5.0000 mg | ORAL_TABLET | Freq: Once | ORAL | Status: AC
  Administered 2015-08-08: 5 mg via ORAL
  Filled 2015-08-08: qty 1

## 2015-08-08 MED ORDER — SODIUM CHLORIDE 0.9 % IV BOLUS (SEPSIS)
500.0000 mL | Freq: Once | INTRAVENOUS | Status: AC
  Administered 2015-08-08: 500 mL via INTRAVENOUS

## 2015-08-08 MED ORDER — DILTIAZEM HCL 100 MG IV SOLR
5.0000 mg/h | INTRAVENOUS | Status: DC
  Administered 2015-08-08: 5 mg/h via INTRAVENOUS
  Filled 2015-08-08: qty 100

## 2015-08-08 MED ORDER — METOPROLOL TARTRATE 1 MG/ML IV SOLN
5.0000 mg | Freq: Once | INTRAVENOUS | Status: AC
  Administered 2015-08-08: 5 mg via INTRAVENOUS
  Filled 2015-08-08: qty 5

## 2015-08-08 NOTE — Progress Notes (Signed)
Cory Ryan, notified Dr. Philbert Riser. Patient denies chest pain. Still appears to be short of breath but denies that he is. Will continue to monitor.

## 2015-08-08 NOTE — Progress Notes (Signed)
ANTICOAGULATION CONSULT NOTE - Initial Consult  Pharmacy Consult for Warfarin Indication: atrial fibrillation  No Known Allergies  Patient Measurements: Height: 5\' 10"  (177.8 cm) Weight: 169 lb (76.658 kg) IBW/kg (Calculated) : 73  Vital Signs: Temp: 97 F (36.1 C) (10/27 1949) Temp Source: Oral (10/27 1532) BP: 125/97 mmHg (10/27 2000) Pulse Rate: 110 (10/27 1949)  Labs:  Recent Labs  08/07/15 0903 08/08/15 1713  HGB  --  13.2  HCT  --  40.7  PLT  --  63*  LABPROT  --  24.0*  INR 2.5 2.17*  CREATININE  --  1.38*  TROPONINI  --  1.50*    Assessment: 74yoM transferred from outside hospital with AFIB/RVR. PMHx of CHF EF of 45-50%, CAD s/p CABG, PAF on coumadin PTA.  Home dose:  2.5 mg MWF, 5 mg AOD - last dose on 08/07/15  Date 10/27   INR 2.17   Warfarin dose 5 mg      Goal of Therapy:  INR 2-3 Monitor platelets by anticoagulation protocol: Yes   Plan:  1. Warfarin 5 mg tonight x 1 2. Daily INR while inpatient 3. Following   Vincenza Hews, PharmD, BCPS 08/08/2015, 10:50 PM Pager: [phone]

## 2015-08-08 NOTE — ED Provider Notes (Signed)
CSN: [id number]     Arrival date & time 08/08/15  1529 History   First MD Initiated Contact with Patient 08/08/15 1648     Chief Complaint  Patient presents with  . Shortness of Breath   HPI  Patient presents to the emergency room with complaints of shortness of breath. Patient states the symptoms started maybe about a week ago. He has noticed some mild to moderate shortness of breath especially when he standing outside her walking around. His had a cough productive of white sputum. Denies any trouble with chest pain abdominal pain or fever. He has not noticed any increased swelling of his legs.  Patient uses his albuterol treatments once daily. He does notice that it gets better after the treatment. He was planning on trying to see his doctor this week but today he had some generalized malaise so he decided to come in to be evaluated. Past Medical History  Diagnosis Date  . Atrial fibrillation ([place])   . Chronic systolic heart failure (HCC)     LVEF 40-45%  . Emphysema   . Coronary atherosclerosis of native coronary artery     Multivessel status post CABG  . Gastric erosions 12/2005    Per EGD - Dr. [name]  . Colon polyps 12/2005    Per colonoscopy  . Ischemic cardiomyopathy   . Aortic stenosis     Moderate/Duplex ultrasound,   . Mitral regurgitation     Moderate  . Pulmonary nodules   . Hypothyroidism   . Psoriasis   . Myocardial infarction [facility])     Remote  . CKD (chronic kidney disease) stage 3, GFR 30-59 ml/min   . Anemia   . Colon cancer ([place])   . Atrial fibrillation (HCC)     on coumadin  . CHF (congestive heart failure) ([place])   . Anxiety   . Shortness of breath     exertion  . GERD (gastroesophageal reflux disease)   . Cardiomyopathy ([place])     2D Echo, EF 40-45%, 9.12.12  . COPD (chronic obstructive pulmonary disease) [facility])    Past Surgical History  Procedure Laterality Date  . Coronary artery bypass graft  March 2004    LIMA to LAD, SVG to diagonal, SVG to OM 2,  SVG to RCA  . Flexible sigmoidoscopy  10/25/2012    Procedure: FLEXIBLE SIGMOIDOSCOPY;  Surgeon: [name], MD;  Location: [facility] ENDO SUITE;  Service: Gastroenterology;  Laterality: N/A;  . Partial colectomy  10/26/2012    Procedure: PARTIAL COLECTOMY;  Surgeon: [name], MD;  Location: [facility] ORS;  Service: General;  Laterality: N/A;  Closure of Colovesical Fistula  . Colectomy N/A 12/13/2013    Procedure: TOTAL COLECTOMY;  Surgeon: [name], MD;  Location: [facility] ORS;  Service: General;  Laterality: N/A;   Family History  Problem Relation Age of Onset  . Cancer Brother   . Hypertension Other   . Heart Problems Mother     unknown   Social History  Substance Use Topics  . Smoking status: Former Smoker -- 1.00 packs/day for 30 years    Types: Cigarettes    Quit date: 03/17/2003  . Smokeless tobacco: Never Used  . Alcohol Use: No     Comment: no alcohol use since '04    Review of Systems  All other systems reviewed and are negative.     Allergies  Review of patient's allergies indicates no known allergies.  Home Medications   Prior to Admission medications  Medication Sig Start Date End Date Taking? Authorizing Provider  carvedilol (COREG) 3.125 MG tablet TAKE 1 TABLET BY MOUTH TWICE DAILY WITH MEALS. 04/05/15  Yes Lendon Colonel, NP  diltiazem (CARDIZEM CD) 120 MG 24 hr capsule TAKE (1) CAPSULE BY MOUTH EVERY DAY. Patient taking differently: TAKE (1) CAPSULE BY MOUTH AT BEDTIME 08/06/15  Yes Lendon Colonel, NP  furosemide (LASIX) 40 MG tablet Take 1 tablet (40 mg total) by mouth daily. 08/23/14  Yes Lendon Colonel, NP  potassium chloride SA (K-DUR,KLOR-CON) 20 MEQ tablet Take 20 mEq by mouth daily.  02/08/12  Yes Rexene Alberts, MD  warfarin (COUMADIN) 5 MG tablet Take 1 tablet daily except 1/2 tablet on Mondays, Wednesdays and Fridays Patient taking differently: Take 2.5-5 mg by mouth at bedtime. Take 1 tablet daily except 1/2 tablet on Mondays, Wednesdays and  Fridays 02/13/15  Yes [name], MD  albuterol (PROVENTIL) (2.5 MG/3ML) 0.083% nebulizer solution Inhale 3 mLs into the lungs every 6 (six) hours as needed. Shortness of breath/wheezing 08/06/15   Historical Provider, MD  ALPRAZolam Duanne Moron) 1 MG tablet Take 1 mg by mouth daily as needed for anxiety.  10/28/12   Historical Provider, MD  Dextromethorphan-Guaifenesin 60-1200 MG per 12 hr tablet Take 1 tablet by mouth every 12 (twelve) hours. Patient not taking: Reported on 08/08/2015 07/02/14   Lendon Colonel, NP  HYDROcodone-acetaminophen (NORCO) 5-325 MG per tablet Take 1-2 tablets by mouth every 4 (four) hours as needed. Patient taking differently: Take 1 tablet by mouth every 6 (six) hours as needed for moderate pain.  12/18/13   Aviva Signs, MD  Ipratropium-Albuterol (COMBIVENT RESPIMAT) 20-100 MCG/ACT AERS respimat Inhale 1 puff into the lungs as needed. Patient taking differently: Inhale 1 puff into the lungs as needed for wheezing or shortness of breath.  06/15/13   [name], MD  loperamide (IMODIUM) 2 MG capsule Take 1 capsule (2 mg total) by mouth as needed for diarrhea or loose stools. 10/29/12   Chelsea Primus, MD   BP 119/92 mmHg  Pulse 36  Temp(Src) 97.6 F (36.4 C) (Oral)  Resp 13  Ht 5\' 10"  (1.778 m)  Wt 169 lb (76.658 kg)  BMI 24.25 kg/m2  SpO2 100% Physical Exam  Constitutional: No distress.  HENT:  Head: Normocephalic and atraumatic.  Right Ear: External ear normal.  Left Ear: External ear normal.  Eyes: Conjunctivae are normal. Right eye exhibits no discharge. Left eye exhibits no discharge. No scleral icterus.  Neck: Neck supple. No tracheal deviation present.  Cardiovascular: Normal rate, regular rhythm and intact distal pulses.   Pulmonary/Chest: Effort normal. No stridor. No respiratory distress. He has wheezes (slight wheezing at the bases). He has no rales.  Abdominal: Soft. Bowel sounds are normal. He exhibits no distension. There is no tenderness.  There is no rebound and no guarding.  Musculoskeletal: He exhibits no edema or tenderness.  Neurological: He is alert. He has normal strength. No cranial nerve deficit (no facial droop, extraocular movements intact, no slurred speech) or sensory deficit. He exhibits normal muscle tone. He displays no seizure activity. Coordination normal.  Skin: Skin is warm and dry. No rash noted.  Psychiatric: He has a normal mood and affect.  Nursing note and vitals reviewed.   ED Course  Procedures (including critical care time) CRITICAL CARE Performed by: WFUXN,ATF Total critical care time: 35 minutes Critical care time was exclusive of separately billable procedures and treating other patients. Critical care was necessary to treat  or prevent imminent or life-threatening deterioration. Critical care was time spent personally by me on the following activities: development of treatment plan with patient and/or surrogate as well as nursing, discussions with consultants, evaluation of patient's response to treatment, examination of patient, obtaining history from patient or surrogate, ordering and performing treatments and interventions, ordering and review of laboratory studies, ordering and review of radiographic studies, pulse oximetry and re-evaluation of patient's condition.  Labs Review Labs Reviewed  BASIC METABOLIC PANEL - Abnormal; Notable for the following:    Glucose, Bld 118 (*)    BUN 21 (*)    Creatinine, Ser 1.38 (*)    GFR calc non Af Amer 48 (*)    GFR calc Af Amer 56 (*)    All other components within normal limits  CBC - Abnormal; Notable for the following:    Platelets 63 (*)    All other components within normal limits  TROPONIN I - Abnormal; Notable for the following:    Troponin I 1.50 (*)    All other components within normal limits  BRAIN NATRIURETIC PEPTIDE - Abnormal; Notable for the following:    B Natriuretic Peptide 725.0 (*)    All other components within normal  limits  PROTIME-INR - Abnormal; Notable for the following:    Prothrombin Time 24.0 (*)    INR 2.17 (*)    All other components within normal limits    Imaging Review Dg Chest 2 View  08/08/2015  CLINICAL DATA:  Left-sided chest pain, shortness of breath. EXAM: CHEST  2 VIEW COMPARISON:  October 29, 2014. FINDINGS: Stable cardiomegaly. Status post coronary artery bypass graft. No pneumothorax or pleural effusion is noted. Hyperexpansion of the lungs is noted. No acute pulmonary disease is noted. Bony thorax is unremarkable. IMPRESSION: Findings consistent with chronic obstructive pulmonary disease. No acute cardiopulmonary abnormality seen. Electronically Signed   By: Marijo Conception, M.D.   On: 08/08/2015 16:19   I have personally reviewed and evaluated these images and lab results as part of my medical decision-making.   EKG Interpretation   Date/Time:  Thursday August 08 2015 16:58:18 EDT Ventricular Rate:  124 PR Interval:    QRS Duration: 97 QT Interval:  323 QTC Calculation: 464 R Axis:   50 Text Interpretation:  Atrial fibrillation Repol abnrm suggests ischemia,  diffuse leads No significant change since last tracing except rate slower  Confirmed by Mihika Surrette  MD-J, Nieves Barberi ([id number]) on 08/08/2015 5:04:10 PM     Medications  metoprolol (LOPRESSOR) injection 5 mg (not administered)  aspirin chewable tablet 324 mg (not administered)  sodium chloride 0.9 % bolus 500 mL (500 mLs Intravenous New Bag/Given 08/08/15 1725)  ipratropium-albuterol (DUONEB) 0.5-2.5 (3) MG/3ML nebulizer solution 3 mL (3 mLs Nebulization Given 08/08/15 1710)  albuterol (PROVENTIL) (2.5 MG/3ML) 0.083% nebulizer solution 2.5 mg (2.5 mg Nebulization Given 08/08/15 1710)    MDM   Final diagnoses:  Atrial fibrillation with rapid ventricular response (HCC)  NSTEMI (non-ST elevated myocardial infarction) (HCC)    Pt has no evidence of CHF or PNA on cxr.  Cardiac enzymes are elevated.  His shortness of breath  may be related to cardiac ischemia.  He still denies any chest pain. Additionally, his a fib is not well controlled.  He has had rates up into the 140s in the ED.  He does not feel symptomatic when this happens.  Will start him on a dose of metoprolol for his rate.  INR is therapeutic.  Will hold off  on heparin considering his adequate anticoagulation. Patient will require admission. I will consult with cardiology in [facility] to see if he should be transferred to [facility].  Discussed with Dr Meda Coffee.  She agrees with transfer to [facility].   Hr improved with metoprolol  Dorie Rank, MD 08/08/15 2004

## 2015-08-08 NOTE — H&P (Signed)
HPI: [name] is a 74 y.o.male with history of mixed CHF EF of 45-50%, CAD s/p CABG, PAF on coumadin, with low risk myoview in January of 2014 who is transferred from [facility] ED for AF RVR.  He presented initially with DOE, occasional CP and some swelling over the last week.  He also has occasional palpitations.  He states his episodes of CP are in the left lateral chest, burning in nature and unpredictable.  They can last minutes to hours.  Evaluation at [facility] ED significant for AF RVR and mildly elevated troponin at 1.5 prompting transfer to [facility].  On arrival here he is CP free, does not feel palpitation and is overall comfortable.  However, his HR is fluctuating from 100-120 bpm on tele.    Review of Systems:     Cardiac Review of Systems: {Y] = yes [ ]  = no  Chest Pain [ x   ]  Resting SOB [ x  ] Exertional SOB  [ x ]  Orthopnea [  ]   Pedal Edema [  x ]    Palpitations [ x ] Syncope  [  ]   Presyncope [   ]  General Review of Systems: [Y] = yes [  ]=no Constitional: recent weight change [  ]; anorexia [  ]; fatigue [  ]; nausea [  ]; night sweats [  ]; fever [  ]; or chills [  ];                                                                      Dental: poor dentition[  ];   Eye : blurred vision [  ]; diplopia [   ]; vision changes [  ];  Amaurosis fugax[  ]; Resp: cough [  ];  wheezing[  ];  hemoptysis[  ]; shortness of breath[  ]; paroxysmal nocturnal dyspnea[  ]; dyspnea on exertion[  ]; or orthopnea[  ];  GI:  gallstones[  ], vomiting[  ];  dysphagia[  ]; melena[  ];  hematochezia [  ]; heartburn[  ];   GU: kidney stones [  ]; hematuria[  ];   dysuria [  ];  nocturia[  ];               Skin: rash [  ], swelling[  ];, hair loss[  ];  peripheral edema[  ];  or itching[  ]; Musculosketetal: myalgias[  ];  joint swelling[  ];  joint erythema[  ];  joint pain[  ];  back pain[  ];  Heme/Lymph: bruising[  ];  bleeding[  ];  anemia[  ];  Neuro: TIA[  ];  headaches[  ];  stroke[  ];  vertigo[   ];  seizures[  ];   paresthesias[  ];  difficulty walking[  ];  Psych:depression[  ]; anxiety[  ];  Endocrine: diabetes[  ];  thyroid dysfunction[  ];  Other:  Past Medical History  Diagnosis Date  . Atrial fibrillation ([place])   . Chronic systolic heart failure (HCC)     LVEF 40-45%  . Emphysema   . Coronary atherosclerosis of native coronary artery     Multivessel status post CABG  . Gastric erosions 12/2005  Per EGD - Dr. [name]  . Colon polyps 12/2005    Per colonoscopy  . Ischemic cardiomyopathy   . Aortic stenosis     Moderate/Duplex ultrasound,   . Mitral regurgitation     Moderate  . Pulmonary nodules   . Hypothyroidism   . Psoriasis   . Myocardial infarction [facility])     Remote  . CKD (chronic kidney disease) stage 3, GFR 30-59 ml/min   . Anemia   . Colon cancer ([place])   . Atrial fibrillation (HCC)     on coumadin  . CHF (congestive heart failure) ([place])   . Anxiety   . Shortness of breath     exertion  . GERD (gastroesophageal reflux disease)   . Cardiomyopathy ([place])     2D Echo, EF 40-45%, 9.12.12  . COPD (chronic obstructive pulmonary disease) (HCC)    No current facility-administered medications on file prior to encounter.   Current Outpatient Prescriptions on File Prior to Encounter  Medication Sig Dispense Refill  . carvedilol (COREG) 3.125 MG tablet TAKE 1 TABLET BY MOUTH TWICE DAILY WITH MEALS. 60 tablet 0  . diltiazem (CARDIZEM CD) 120 MG 24 hr capsule TAKE (1) CAPSULE BY MOUTH EVERY DAY. (Patient taking differently: TAKE (1) CAPSULE BY MOUTH AT BEDTIME) 30 capsule 1  . furosemide (LASIX) 40 MG tablet Take 1 tablet (40 mg total) by mouth daily. 90 tablet 3  . potassium chloride SA (K-DUR,KLOR-CON) 20 MEQ tablet Take 20 mEq by mouth daily.     [name] warfarin (COUMADIN) 5 MG tablet Take 1 tablet daily except 1/2 tablet on Mondays, Wednesdays and Fridays (Patient taking differently: Take 2.5-5 mg by mouth at bedtime. Take 1 tablet daily except 1/2 tablet on  Mondays, Wednesdays and Fridays) 30 tablet 6  . ALPRAZolam (XANAX) 1 MG tablet Take 1 mg by mouth daily as needed for anxiety.     . Dextromethorphan-Guaifenesin 60-1200 MG per 12 hr tablet Take 1 tablet by mouth every 12 (twelve) hours. (Patient not taking: Reported on 08/08/2015) 60 tablet 0  . HYDROcodone-acetaminophen (NORCO) 5-325 MG per tablet Take 1-2 tablets by mouth every 4 (four) hours as needed. (Patient taking differently: Take 1 tablet by mouth every 6 (six) hours as needed for moderate pain. ) 40 tablet 0  . Ipratropium-Albuterol (COMBIVENT RESPIMAT) 20-100 MCG/ACT AERS respimat Inhale 1 puff into the lungs as needed. (Patient taking differently: Inhale 1 puff into the lungs as needed for wheezing or shortness of breath. ) 1 Inhaler 1  . loperamide (IMODIUM) 2 MG capsule Take 1 capsule (2 mg total) by mouth as needed for diarrhea or loose stools. 20 capsule 2     No Known Allergies  Social History   Social History  . Marital Status: Widowed    Spouse Name: N/A  . Number of Children: N/A  . Years of Education: N/A   Occupational History  . Not on file.   Social History Main Topics  . Smoking status: Former Smoker -- 1.00 packs/day for 30 years    Types: Cigarettes    Quit date: 03/17/2003  . Smokeless tobacco: Never Used  . Alcohol Use: No     Comment: no alcohol use since '04  . Drug Use: No  . Sexual Activity: Not Currently   Other Topics Concern  . Not on file   Social History Narrative    Family History  Problem Relation Age of Onset  . Cancer Brother   . Hypertension Other   . Heart  Problems Mother     unknown    PHYSICAL EXAM: Filed Vitals:   08/08/15 2000  BP: 125/97  Pulse:   Temp:   Resp: 23   General:  Elderly.  Well appearing. No respiratory difficulty HEENT: [place], AT, PERRl, EOMI Neck: supple. Elevated JVP. Carotids 2+ bilat; no bruits. No lymphadenopathy or thryomegaly appreciated. Cor: PMI nondisplaced. Irregular rate & rhythm, overall  tachy. No rubs, gallops or murmurs. Lungs: Bibasilar crackles, fair air movement, expiratory wheezes with prolonged expiratory phase Abdomen: soft, nontender, nondistended. No hepatosplenomegaly. No bruits or masses. Good bowel sounds. Extremities: no cyanosis, clubbing, rash, edema Neuro: alert & oriented x 3, cranial nerves grossly intact. moves all 4 extremities w/o difficulty. Affect pleasant.  ECG:  AF RVR, nonspecific STT changes  Results for orders placed or performed during the hospital encounter of 08/08/15 (from the past 24 hour(s))  Basic metabolic panel     Status: Abnormal   Collection Time: 08/08/15  5:13 PM  Result Value Ref Range   Sodium 140 135 - 145 mmol/L   Potassium 4.5 3.5 - 5.1 mmol/L   Chloride 105 101 - 111 mmol/L   CO2 26 22 - 32 mmol/L   Glucose, Bld 118 (H) 65 - 99 mg/dL   BUN 21 (H) 6 - 20 mg/dL   Creatinine, Ser 1.38 (H) 0.61 - 1.24 mg/dL   Calcium 9.6 8.9 - 10.3 mg/dL   GFR calc non Af Amer 48 (L) >60 mL/min   GFR calc Af Amer 56 (L) >60 mL/min   Anion gap 9 5 - 15  CBC     Status: Abnormal   Collection Time: 08/08/15  5:13 PM  Result Value Ref Range   WBC 5.7 4.0 - 10.5 K/uL   RBC 4.27 4.22 - 5.81 MIL/uL   Hemoglobin 13.2 13.0 - 17.0 g/dL   HCT 40.7 39.0 - 52.0 %   MCV 95.3 78.0 - 100.0 fL   MCH 30.9 26.0 - 34.0 pg   MCHC 32.4 30.0 - 36.0 g/dL   RDW 14.7 11.5 - 15.5 %   Platelets 63 (L) 150 - 400 K/uL  Troponin I     Status: Abnormal   Collection Time: 08/08/15  5:13 PM  Result Value Ref Range   Troponin I 1.50 (HH) <0.031 ng/mL  Brain natriuretic peptide     Status: Abnormal   Collection Time: 08/08/15  5:13 PM  Result Value Ref Range   B Natriuretic Peptide 725.0 (H) 0.0 - 100.0 pg/mL  Protime-INR     Status: Abnormal   Collection Time: 08/08/15  5:13 PM  Result Value Ref Range   Prothrombin Time 24.0 (H) 11.6 - 15.2 seconds   INR 2.17 (H) 0.00 - 1.49   Dg Chest 2 View  08/08/2015  CLINICAL DATA:  Left-sided chest pain, shortness of  breath. EXAM: CHEST  2 VIEW COMPARISON:  October 29, 2014. FINDINGS: Stable cardiomegaly. Status post coronary artery bypass graft. No pneumothorax or pleural effusion is noted. Hyperexpansion of the lungs is noted. No acute pulmonary disease is noted. Bony thorax is unremarkable. IMPRESSION: Findings consistent with chronic obstructive pulmonary disease. No acute cardiopulmonary abnormality seen. Electronically Signed   By: Marijo Conception, M.D.   On: 08/08/2015 16:19     ASSESSMENT: 74 yo man with h/o CAD s/p CABG, HFpEF and persistent AF who presents with SOB, intermittent CP and palpitation found to be in AF RVR with mildly elevated troponin.  I suspect the majority of his problems  are driven by his AF RVR.  He is volume overloaded on exam with significantly elevated JVP which is likely from his diastolic CHF and exacerbated by RVR.  As for his troponin, I suspect this is a type II NSTEMI (demand-ischemia) and not true ACS.    PLAN/DISCUSSION: IV dilt for rate control IV lasix for diuresis  Continue coumadin for anticoagulation Given his CAD, he should be on ASA and statin therapy which I do not see on his med list

## 2015-08-08 NOTE — ED Notes (Signed)
Pt c/o of SOB and central abdominal pain x 1 week. Pt noted to have bilateral lower extremity edema. Lung sounds clear. AOx4. Pt hx of CHF and COPD.

## 2015-08-08 NOTE — Progress Notes (Signed)
Paged Fellow on Call for Cardiology for admission orders.

## 2015-08-09 DIAGNOSIS — I482 Chronic atrial fibrillation: Principal | ICD-10-CM

## 2015-08-09 LAB — BASIC METABOLIC PANEL
ANION GAP: 10 (ref 5–15)
Anion gap: 8 (ref 5–15)
BUN: 22 mg/dL — AB (ref 6–20)
BUN: 21 mg/dL — AB (ref 6–20)
CALCIUM: 8.8 mg/dL — AB (ref 8.9–10.3)
CO2: 26 mmol/L (ref 22–32)
CO2: 27 mmol/L (ref 22–32)
CREATININE: 1.49 mg/dL — AB (ref 0.61–1.24)
Calcium: 8.8 mg/dL — ABNORMAL LOW (ref 8.9–10.3)
Chloride: 101 mmol/L (ref 101–111)
Chloride: 99 mmol/L — ABNORMAL LOW (ref 101–111)
Creatinine, Ser: 1.58 mg/dL — ABNORMAL HIGH (ref 0.61–1.24)
GFR calc Af Amer: 47 mL/min — ABNORMAL LOW (ref >60)
GFR calc Af Amer: 51 mL/min — ABNORMAL LOW (ref >60)
GFR calc non Af Amer: 41 mL/min — ABNORMAL LOW (ref >60)
GFR calc non Af Amer: 44 mL/min — ABNORMAL LOW (ref >60)
GLUCOSE: 115 mg/dL — AB (ref 65–99)
GLUCOSE: 143 mg/dL — AB (ref 65–99)
POTASSIUM: 3.5 mmol/L (ref 3.5–5.1)
Potassium: 3.6 mmol/L (ref 3.5–5.1)
Sodium: 137 mmol/L (ref 135–145)
Sodium: 134 mmol/L — ABNORMAL LOW (ref 135–145)

## 2015-08-09 LAB — LIPID PANEL
CHOL/HDL RATIO: 5.4 ratio
Cholesterol: 145 mg/dL (ref 0–200)
HDL: 27 mg/dL — ABNORMAL LOW (ref >40)
LDL CALC: 103 mg/dL — AB (ref 0–99)
Triglycerides: 75 mg/dL (ref <150)
VLDL: 15 mg/dL (ref 0–40)

## 2015-08-09 LAB — TSH: TSH: 1.317 u[IU]/mL (ref 0.350–4.500)

## 2015-08-09 LAB — PROTIME-INR
INR: 2.3 — AB (ref 0.00–1.49)
PROTHROMBIN TIME: 25.1 s — AB (ref 11.6–15.2)

## 2015-08-09 LAB — CBC
HCT: 39.8 % (ref 39.0–52.0)
Hemoglobin: 13.1 g/dL (ref 13.0–17.0)
MCH: 31.5 pg (ref 26.0–34.0)
MCHC: 32.9 g/dL (ref 30.0–36.0)
MCV: 95.7 fL (ref 78.0–100.0)
PLATELETS: 69 10*3/uL — AB (ref 150–400)
RBC: 4.16 MIL/uL — ABNORMAL LOW (ref 4.22–5.81)
RDW: 14.9 % (ref 11.5–15.5)
WBC: 5.6 10*3/uL (ref 4.0–10.5)

## 2015-08-09 LAB — TROPONIN I
TROPONIN I: 2.05 ng/mL — AB (ref <0.031)
Troponin I: 1.95 ng/mL (ref <0.031)

## 2015-08-09 LAB — MRSA PCR SCREENING: MRSA by PCR: NEGATIVE

## 2015-08-09 MED ORDER — DILTIAZEM HCL 30 MG PO TABS
45.0000 mg | ORAL_TABLET | Freq: Four times a day (QID) | ORAL | Status: DC
  Administered 2015-08-09 – 2015-08-10 (×4): 45 mg via ORAL
  Filled 2015-08-09 (×5): qty 2

## 2015-08-09 MED ORDER — WARFARIN SODIUM 5 MG PO TABS
5.0000 mg | ORAL_TABLET | ORAL | Status: DC
  Administered 2015-08-10 – 2015-08-11 (×2): 5 mg via ORAL
  Filled 2015-08-09 (×2): qty 1

## 2015-08-09 MED ORDER — WARFARIN SODIUM 2.5 MG PO TABS: 2.5000 mg | ORAL_TABLET | ORAL | Status: DC

## 2015-08-09 MED ORDER — FUROSEMIDE 20 MG PO TABS
20.0000 mg | ORAL_TABLET | Freq: Every day | ORAL | Status: DC
  Administered 2015-08-10 – 2015-08-11 (×2): 20 mg via ORAL
  Filled 2015-08-09 (×2): qty 1

## 2015-08-09 MED ORDER — OFF THE BEAT BOOK
Freq: Once | Status: AC
  Administered 2015-08-09: 04:00:00
  Filled 2015-08-09: qty 1

## 2015-08-09 MED ORDER — CARVEDILOL 6.25 MG PO TABS
6.2500 mg | ORAL_TABLET | Freq: Two times a day (BID) | ORAL | Status: DC
  Administered 2015-08-09 – 2015-08-11 (×5): 6.25 mg via ORAL
  Filled 2015-08-09 (×5): qty 1

## 2015-08-09 MED ORDER — ATORVASTATIN CALCIUM 80 MG PO TABS
80.0000 mg | ORAL_TABLET | Freq: Every day | ORAL | Status: DC
  Administered 2015-08-09 – 2015-08-11 (×3): 80 mg via ORAL
  Filled 2015-08-09 (×3): qty 1

## 2015-08-09 NOTE — Progress Notes (Addendum)
Patient's HR 88-110, Card gtt 5/hr. Received order from Dr. Volney American to start PO 45mg  q 6hrs, give first dose and stop drip in 1 hour.

## 2015-08-09 NOTE — Progress Notes (Signed)
Patient ID: Cory Ryan, male   DOB: 12/20/1940, 74 y.o.   MRN: [id number]    Subjective:  Denies SSCP, palpitations or Dyspnea Breathing better  Objective:  Filed Vitals:   08/09/15 0411 08/09/15 0455 08/09/15 0619 08/09/15 0800  BP:    107/76  Pulse:    93  Temp: 98.7 F (37.1 C)   98.5 F (36.9 C)  TempSrc: Axillary   Oral  Resp:    15  Height:      Weight:  82.464 kg (181 lb 12.8 oz)    SpO2:   96% 95%    Intake/Output from previous day:  Intake/Output Summary (Last 24 hours) at 08/09/15 1001 Last data filed at 08/09/15 0800  Gross per 24 hour  Intake    200 ml  Output   1270 ml  Net  -1070 ml    Physical Exam: Affect appropriate Elderly white male  HEENT: normal Neck supple with no adenopathy JVP normal no bruits no thyromegaly Lungs clear with no wheezing and good diaphragmatic motion Heart:  S1/S2 mild AS murmur, no rub, gallop or click PMI normal Abdomen: benighn, BS positve, no tenderness, no AAA no bruit.  No HSM or HJR Distal pulses intact with no bruits Plus one brawny bilateral edema and chronic venous isuf.  Neuro non-focal Skin warm and dry No muscular weakness   Lab Results: Basic Metabolic Panel:  Recent Labs  08/08/15 1713 08/09/15 0438  NA 140 137  K 4.5 3.5  CL 105 101  CO2 26 26  GLUCOSE 118* 115*  BUN 21* 22*  CREATININE 1.38* 1.58*  CALCIUM 9.6 8.8*   CBC:  Recent Labs  08/08/15 1713 08/09/15 0438  WBC 5.7 5.6  HGB 13.2 13.1  HCT 40.7 39.8  MCV 95.3 95.7  PLT 63* 69*   Cardiac Enzymes:  Recent Labs  08/08/15 1713 08/08/15 2254 08/09/15 0438  TROPONINI 1.50* 1.67* 2.05*     Recent Labs  08/09/15 0438  CHOL 145  HDL 27*  LDLCALC 103*  TRIG 75  CHOLHDL 5.4   Thyroid Function Tests:  Recent Labs  08/08/15 2255  TSH 1.317    Imaging: Dg Chest 2 View  08/08/2015  CLINICAL DATA:  Left-sided chest pain, shortness of breath. EXAM: CHEST  2 VIEW COMPARISON:  October 29, 2014. FINDINGS: Stable  cardiomegaly. Status post coronary artery bypass graft. No pneumothorax or pleural effusion is noted. Hyperexpansion of the lungs is noted. No acute pulmonary disease is noted. Bony thorax is unremarkable. IMPRESSION: Findings consistent with chronic obstructive pulmonary disease. No acute cardiopulmonary abnormality seen. Electronically Signed   By: Marijo Conception, M.D.   On: 08/08/2015 16:19    Cardiac Studies:  ECG: afib nonspecific ST changes rate 124    Telemetry:  afib rates 90-100  Echo: 07/04/14  EF 45-50% moderate MR mild AS   Medications:   . aspirin EC  81 mg Oral Daily  . atorvastatin  80 mg Oral q1800  . carvedilol  3.125 mg Oral BID WC  . diltiazem  45 mg Oral 4 times per day  . potassium chloride SA  20 mEq Oral Daily  . [START ON 08/10/2015] warfarin  5 mg Oral Once per day on Sun Tue Thu Sat   And  . warfarin  2.5 mg Oral Once per day on Mon Wed Fri  . Warfarin - Pharmacist Dosing Inpatient   Does not apply q1800     . diltiazem (CARDIZEM) infusion Stopped (08/09/15 0521)  Assessment/Plan:  Afib:  Chronic seems since 2015   INR Rx 2.3  Continue iv cardizem for rate control increase coreg  F/U echo to assess EF and degree of MR/AS  Latter not severe on exam CHF:  Improved with lasix.  Cr elevated will give once daily low dose   CAD:  Previous CABG low risk myovue 2014  No chest pain Likely demand ischemia from rapid afib Since he is anticoagulated will avoid cath.  Can have outpatient myovue For risk stratification in [place]   Should be ready for d/c by Sunday   Jenkins Rouge 08/09/2015, 10:01 AM

## 2015-08-09 NOTE — Progress Notes (Signed)
ANTICOAGULATION CONSULT NOTE - Follow Up Consult  Pharmacy Consult for Coumadin Indication: atrial fibrillation  No Known Allergies  Patient Measurements: Height: 5\' 10"  (177.8 cm) Weight: 181 lb 12.8 oz (82.464 kg) IBW/kg (Calculated) : 73  Vital Signs: Temp: 98.7 F (37.1 C) (10/28 0411) Temp Source: Axillary (10/28 0411) BP: 139/81 mmHg (10/28 0403) Pulse Rate: 87 (10/28 0400)  Labs:  Recent Labs  08/07/15 0903 08/08/15 1713 08/08/15 2254 08/09/15 0438  HGB  --  13.2  --  13.1  HCT  --  40.7  --  39.8  PLT  --  63*  --  69*  LABPROT  --  24.0*  --  25.1*  INR 2.5 2.17*  --  2.30*  CREATININE  --  1.38*  --  1.58*  TROPONINI  --  1.50* 1.67* 2.05*    Estimated Creatinine Clearance: 41.1 mL/min (by C-G formula based on Cr of 1.58).   Assessment: 74yoM transferred from outside hospital with AFIB/RVR. PMHx of CHF EF of 45-50%, CAD s/p CABG, PAF on coumadin. PTA Coumadin 5mg  daily exc 2.5mg  MWF for Afib. INR is therapeutic on admission and remains therapeutic at 2.3.  Goal of Therapy:  INR 2-3 Monitor platelets by anticoagulation protocol: Yes   Plan:  Continue PTA Coumadin 5mg  daily exc 2.5mg  MWF Monitor daily INR, CBC, s/s of bleed  If therapeutic tomorrow, could check INR and CBC three times a week

## 2015-08-09 NOTE — Progress Notes (Signed)
Audible wheezing noted upon entering pts room, prn neb tx given per pt request with improvement noted, will continue to monitor closely.  Edward Qualia RN

## 2015-08-09 NOTE — Progress Notes (Signed)
Utilization review completed.  

## 2015-08-10 DIAGNOSIS — D696 Thrombocytopenia, unspecified: Secondary | ICD-10-CM

## 2015-08-10 LAB — PROTIME-INR
INR: 2.27 — ABNORMAL HIGH (ref 0.00–1.49)
Prothrombin Time: 24.8 seconds — ABNORMAL HIGH (ref 11.6–15.2)

## 2015-08-10 LAB — CBC
HCT: 40.9 % (ref 39.0–52.0)
Hemoglobin: 13.2 g/dL (ref 13.0–17.0)
MCH: 31 pg (ref 26.0–34.0)
MCHC: 32.3 g/dL (ref 30.0–36.0)
MCV: 96 fL (ref 78.0–100.0)
Platelets: 66 10*3/uL — ABNORMAL LOW (ref 150–400)
RBC: 4.26 MIL/uL (ref 4.22–5.81)
RDW: 14.9 % (ref 11.5–15.5)
WBC: 6.3 10*3/uL (ref 4.0–10.5)

## 2015-08-10 MED ORDER — DILTIAZEM HCL ER COATED BEADS 120 MG PO CP24
120.0000 mg | ORAL_CAPSULE | Freq: Two times a day (BID) | ORAL | Status: DC
  Administered 2015-08-10 – 2015-08-11 (×3): 120 mg via ORAL
  Filled 2015-08-10 (×3): qty 1

## 2015-08-10 NOTE — Progress Notes (Signed)
Patient had 6 beats VT.  RN notified by CMT, strip saved to Epic.  Patient asymptomatic.  Will notify Tarri Fuller, PA.

## 2015-08-10 NOTE — Progress Notes (Signed)
ANTICOAGULATION CONSULT NOTE - Follow Up Consult  Pharmacy Consult for Coumadin Indication: atrial fibrillation  No Known Allergies  Patient Measurements: Height: 5\' 10"  (177.8 cm) Weight: 182 lb (82.555 kg) IBW/kg (Calculated) : 73  Vital Signs: Temp: 97.5 F (36.4 C) (10/29 0500) Temp Source: Oral (10/29 0100) BP: 136/88 mmHg (10/29 0812) Pulse Rate: 106 (10/29 0812)  Labs:  Recent Labs  08/08/15 1713 08/08/15 2254 08/09/15 0438 08/09/15 1013 08/10/15  HGB 13.2  --  13.1  --  13.2  HCT 40.7  --  39.8  --  40.9  PLT 63*  --  69*  --  66*  LABPROT 24.0*  --  25.1*  --  24.8*  INR 2.17*  --  2.30*  --  2.27*  CREATININE 1.38*  --  1.58* 1.49*  --   TROPONINI 1.50* 1.67* 2.05* 1.95*  --     Estimated Creatinine Clearance: 43.5 mL/min (by C-G formula based on Cr of 1.49).   Assessment: 74yoM transferred from outside hospital with AFIB/RVR. PMHx of CHF EF of 45-50%, CAD s/p CABG, PAF on coumadin. PTA Coumadin 5mg  daily exc 2.5mg  MWF for Afib. INR is therapeutic on admission and remains therapeutic at 2.27.  Goal of Therapy:  INR 2-3 Monitor platelets by anticoagulation protocol: Yes   Plan:  Continue PTA Coumadin 5mg  daily exc 2.5mg  MWF Check INR and CBC three times a week

## 2015-08-10 NOTE — Progress Notes (Signed)
Subjective:  He wants to know about a breathing treatment.  No complaints of shortness of breath however and no chest pain today.  No palpitations.  Objective:  Vital Signs in the last 24 hours: BP 114/82 mmHg  Pulse 88  Temp(Src) 97.5 F (36.4 C) (Oral)  Resp 21  Ht 5\' 10"  (1.778 m)  Wt 82.555 kg (182 lb)  BMI 26.11 kg/m2  SpO2 96%  Physical Exam: Pleasant male in no acute distress Lungs: Occasional wheezing Cardiac:  Irregular rhythm, normal S1 and S2, no S3, 2/6 systolic murmur left sternal border with preserved S2 Abdomen:  Soft, nontender, no masses Extremities:  No edema present  Intake/Output from previous day: 10/28 0701 - 10/29 0700 In: 200 [P.O.:200] Out: 550 [Urine:550]  Weight Filed Weights   08/08/15 2101 08/09/15 0455 08/10/15 0500  Weight: 84.052 kg (185 lb 4.8 oz) 82.464 kg (181 lb 12.8 oz) 82.555 kg (182 lb)    Lab Results: Basic Metabolic Panel:  Recent Labs  08/09/15 0438 08/09/15 1013  NA 137 134*  K 3.5 3.6  CL 101 99*  CO2 26 27  GLUCOSE 115* 143*  BUN 22* 21*  CREATININE 1.58* 1.49*   CBC:  Recent Labs  08/09/15 0438 08/10/15  WBC 5.6 6.3  HGB 13.1 13.2  HCT 39.8 40.9  MCV 95.7 96.0  PLT 69* 66*   Cardiac Panel (last 3 results)  Recent Labs  08/08/15 2254 08/09/15 0438 08/09/15 1013  TROPONINI 1.67* 2.05* 1.95*    Telemetry: Atrial fibrillation, rate still somewhat above 100  Assessment/Plan:  1.  Chronic atrial fibrillation-rate somewhat better controlled 2.  Long-term use of anticoagulants 3.  Elevated troponin likely demand ischemia due to rapid atrial fibrillation 4.  Chronic combined systolic and diastolic heart failure 5.  Aortic stenosis moderate previously and moderate mitral regurgitation 6.  Stage III chronic kidney disease 7.  Combined systolic and diastolic congestive heart failure  Recommendations:  Increase diltiazem and await results of echocardiogram today.  Thrombocytopenia may need to be  looked at in the office.     Kerry Hough  MD [facility]  08/10/2015, 7:54 AM

## 2015-08-10 NOTE — Care Management Important Message (Signed)
Important Message  Patient Details  Name: Cory Ryan MRN: [id number] Date of Birth: [birth year]   Medicare Important Message Given:  Yes-second notification given    Norina Buzzard, RN 08/10/2015, 10:10 AM

## 2015-08-11 ENCOUNTER — Inpatient Hospital Stay (HOSPITAL_COMMUNITY): Payer: Medicare Other

## 2015-08-11 DIAGNOSIS — J449 Chronic obstructive pulmonary disease, unspecified: Secondary | ICD-10-CM

## 2015-08-11 DIAGNOSIS — I4891 Unspecified atrial fibrillation: Secondary | ICD-10-CM | POA: Insufficient documentation

## 2015-08-11 MED ORDER — FUROSEMIDE 20 MG PO TABS: 20.0000 mg | ORAL_TABLET | Freq: Every day | ORAL | Status: DC

## 2015-08-11 MED ORDER — CARVEDILOL 6.25 MG PO TABS: 6.2500 mg | ORAL_TABLET | Freq: Two times a day (BID) | ORAL | Status: DC

## 2015-08-11 MED ORDER — ATORVASTATIN CALCIUM 40 MG PO TABS: 40.0000 mg | ORAL_TABLET | Freq: Every day | ORAL | Status: DC

## 2015-08-11 MED ORDER — DILTIAZEM HCL ER COATED BEADS 120 MG PO CP24: 120.0000 mg | ORAL_CAPSULE | Freq: Two times a day (BID) | ORAL | Status: DC

## 2015-08-11 MED ORDER — ASPIRIN 81 MG PO TBEC: 81.0000 mg | DELAYED_RELEASE_TABLET | Freq: Every day | ORAL | Status: DC

## 2015-08-11 NOTE — Discharge Instructions (Signed)
Atrial Fibrillation °Atrial fibrillation is a type of heartbeat that is irregular or fast (rapid). If you have this condition, your heart keeps quivering in a weird (chaotic) way. This condition can make it so your heart cannot pump blood normally. Having this condition gives a person more risk for stroke, heart failure, and other heart problems. There are different types of atrial fibrillation. Talk with your doctor to learn about the type that you have. °HOME CARE °· Take over-the-counter and prescription medicines only as told by your doctor. °· If your doctor prescribed a blood-thinning medicine, take it exactly as told. Taking too much of it can cause bleeding. If you do not take enough of it, you will not have the protection that you need against stroke and other problems. °· Do not use any tobacco products. These include cigarettes, chewing tobacco, and e-cigarettes. If you need help quitting, ask your doctor. °· If you have apnea (obstructive sleep apnea), manage it as told by your doctor. °· Do not drink alcohol. °· Do not drink beverages that have caffeine. These include coffee, soda, and tea. °· Maintain a healthy weight. Do not use diet pills unless your doctor says they are safe for you. Diet pills may make heart problems worse. °· Follow diet instructions as told by your doctor. °· Exercise regularly as told by your doctor. °· Keep all follow-up visits as told by your doctor. This is important. °GET HELP IF: °· You notice a change in the speed, rhythm, or strength of your heartbeat. °· You are taking a blood-thinning medicine and you notice more bruising. °· You get tired more easily when you move or exercise. °GET HELP RIGHT AWAY IF: °· You have pain in your chest or your belly (abdomen). °· You have sweating or weakness. °· You feel sick to your stomach (nauseous). °· You notice blood in your throw up (vomit), poop (stool), or pee (urine). °· You are short of breath. °· You suddenly have swollen feet  and ankles. °· You feel dizzy. °· Your suddenly get weak or numb in your face, arms, or legs, especially if it happens on one side of your body. °· You have trouble talking, trouble understanding, or both. °· Your face or your eyelid droops on one side. °These symptoms may be an emergency. Do not wait to see if the symptoms will go away. Get medical help right away. Call your local emergency services (911 in the [place] Do not drive yourself to the hospital. °  °This information is not intended to replace advice given to you by your health care provider. Make sure you discuss any questions you have with your health care provider. °  °Document Released: 07/07/2008 Document Revised: 06/19/2015 Document Reviewed: 01/23/2015 °Elsevier Interactive Patient Education ©2016 Elsevier Inc. ° ° °Heart-Healthy Eating Plan °Many factors influence your heart health, including eating and exercise habits. Heart (coronary) risk increases with abnormal blood fat (lipid) levels. Heart-healthy meal planning includes limiting unhealthy fats, increasing healthy fats, and making other small dietary changes. This includes maintaining a healthy body weight to help keep lipid levels within a normal range. °WHAT IS MY PLAN?  °Your health care provider recommends that you: °· Get no more than _________% of the total calories in your daily diet from fat. °· Limit your intake of saturated fat to less than _________% of your total calories each day. °· Limit the amount of cholesterol in your diet to less than _________ mg per day. °WHAT TYPES OF FAT   SHOULD I CHOOSE? °· Choose healthy fats more often. Choose monounsaturated and polyunsaturated fats, such as olive oil and canola oil, flaxseeds, walnuts, almonds, and seeds. °· Eat more omega-3 fats. Good choices include salmon, mackerel, sardines, tuna, flaxseed oil, and ground flaxseeds. Aim to eat fish at least two times each week. °· Limit saturated fats. Saturated fats are primarily found in  animal products, such as meats, butter, and cream. Plant sources of saturated fats include palm oil, palm kernel oil, and coconut oil. °· Avoid foods with partially hydrogenated oils in them. These contain trans fats. Examples of foods that contain trans fats are stick margarine, some tub margarines, cookies, crackers, and other baked goods. °WHAT GENERAL GUIDELINES DO I NEED TO FOLLOW? °· Check food labels carefully to identify foods with trans fats or high amounts of saturated fat. °· Fill one half of your plate with vegetables and green salads. Eat 4-5 servings of vegetables per day. A serving of vegetables equals 1 cup of raw leafy vegetables, ½ cup of raw or cooked cut-up vegetables, or ½ cup of vegetable juice. °· Fill one fourth of your plate with whole grains. Look for the word "whole" as the first word in the ingredient list. °· Fill one fourth of your plate with lean protein foods. °· Eat 4-5 servings of fruit per day. A serving of fruit equals one medium whole fruit, ¼ cup of dried fruit, ½ cup of fresh, frozen, or canned fruit, or ½ cup of 100% fruit juice. °· Eat more foods that contain soluble fiber. Examples of foods that contain this type of fiber are apples, broccoli, carrots, beans, peas, and barley. Aim to get 20-30 g of fiber per day. °· Eat more home-cooked food and less restaurant, buffet, and fast food. °· Limit or avoid alcohol. °· Limit foods that are high in starch and sugar. °· Avoid fried foods. °· Cook foods by using methods other than frying. Baking, boiling, grilling, and broiling are all great options. Other fat-reducing suggestions include: °¨ Removing the skin from poultry. °¨ Removing all visible fats from meats. °¨ Skimming the fat off of stews, soups, and gravies before serving them. °¨ Steaming vegetables in water or broth. °· Lose weight if you are overweight. Losing just 5-10% of your initial body weight can help your overall health and prevent diseases such as diabetes and  heart disease. °· Increase your consumption of nuts, legumes, and seeds to 4-5 servings per week. One serving of dried beans or legumes equals ½ cup after being cooked, one serving of nuts equals 1½ ounces, and one serving of seeds equals ½ ounce or 1 tablespoon. °· You may need to monitor your salt (sodium) intake, especially if you have high blood pressure. Talk with your health care provider or dietitian to get more information about reducing sodium. °WHAT FOODS CAN I EAT? °Grains °Breads, including French, white, pita, wheat, raisin, rye, oatmeal, and Italian. Tortillas that are neither fried nor made with lard or trans fat. Low-fat rolls, including hotdog and hamburger buns and English muffins. Biscuits. Muffins. Waffles. Pancakes. Light popcorn. Whole-grain cereals. Flatbread. Melba toast. Pretzels. Breadsticks. Rusks. Low-fat snacks and crackers, including oyster, saltine, matzo, graham, animal, and rye. Rice and pasta, including brown rice and those that are made with whole wheat. °Vegetables °All vegetables. °Fruits °All fruits, but limit coconut. °Meats and Other Protein Sources °Lean, well-trimmed beef, veal, pork, and lamb. Chicken and turkey without skin. All fish and shellfish. Wild duck, rabbit, pheasant, and venison.   Egg whites or low-cholesterol egg substitutes. Dried beans, peas, lentils, and tofu. Seeds and most nuts. °Dairy °Low-fat or nonfat cheeses, including ricotta, string, and mozzarella. Skim or 1% milk that is liquid, powdered, or evaporated. Buttermilk that is made with low-fat milk. Nonfat or low-fat yogurt. °Beverages °Mineral water. Diet carbonated beverages. °Sweets and Desserts °Sherbets and fruit ices. Honey, jam, marmalade, jelly, and syrups. Meringues and gelatins. Pure sugar candy, such as hard candy, jelly beans, gumdrops, mints, marshmallows, and small amounts of dark chocolate. Angel food cake. °Eat all sweets and desserts in moderation. °Fats and Oils °Nonhydrogenated  (trans-free) margarines. Vegetable oils, including soybean, sesame, sunflower, olive, peanut, safflower, corn, canola, and cottonseed. Salad dressings or mayonnaise that are made with a vegetable oil. Limit added fats and oils that you use for cooking, baking, salads, and as spreads. °Other °Cocoa powder. Coffee and tea. All seasonings and condiments. °The items listed above may not be a complete list of recommended foods or beverages. Contact your dietitian for more options. °WHAT FOODS ARE NOT RECOMMENDED? °Grains °Breads that are made with saturated or trans fats, oils, or whole milk. Croissants. Butter rolls. Cheese breads. Sweet rolls. Donuts. Buttered popcorn. Chow mein noodles. High-fat crackers, such as cheese or butter crackers. °Meats and Other Protein Sources °Fatty meats, such as hotdogs, short ribs, sausage, spareribs, bacon, ribeye roast or steak, and mutton. High-fat deli meats, such as salami and bologna. Caviar. Domestic duck and goose. Organ meats, such as kidney, liver, sweetbreads, brains, gizzard, chitterlings, and heart. °Dairy °Cream, sour cream, cream cheese, and creamed cottage cheese. Whole milk cheeses, including blue (bleu), Monterey Jack, Brie, Colby, American, Havarti, Swiss, cheddar, Camembert, and [place].  Whole or 2% milk that is liquid, evaporated, or condensed. Whole buttermilk. Cream sauce or high-fat cheese sauce. Yogurt that is made from whole milk. °Beverages °Regular sodas and drinks with added sugar. °Sweets and Desserts °Frosting. Pudding. Cookies. Cakes other than angel food cake. Candy that has milk chocolate or white chocolate, hydrogenated fat, butter, coconut, or unknown ingredients. Buttered syrups. Full-fat ice cream or ice cream drinks. °Fats and Oils °Gravy that has suet, meat fat, or shortening. Cocoa butter, hydrogenated oils, palm oil, coconut oil, palm kernel oil. These can often be found in baked products, candy, fried foods, nondairy creamers, and whipped  toppings. Solid fats and shortenings, including bacon fat, salt pork, lard, and butter. Nondairy cream substitutes, such as coffee creamers and sour cream substitutes. Salad dressings that are made of unknown oils, cheese, or sour cream. °The items listed above may not be a complete list of foods and beverages to avoid. Contact your dietitian for more information. °  °This information is not intended to replace advice given to you by your health care provider. Make sure you discuss any questions you have with your health care provider. °  °Document Released: 07/07/2008 Document Revised: 10/19/2014 Document Reviewed: 03/22/2014 °Elsevier Interactive Patient Education ©2016 Elsevier Inc. ° °

## 2015-08-11 NOTE — Progress Notes (Signed)
Echocardiogram 2D Echocardiogram has been performed.  Joelene Millin 08/11/2015, 4:27 PM

## 2015-08-11 NOTE — Discharge Summary (Signed)
Physician Discharge Summary     Cardiologist: Domenic Polite  Patient ID: Cory Ryan MRN: [id number] DOB/AGE: 76/76/1942 76 y.o.  Admit date: 08/08/2015 Discharge date: 08/11/2015  Admission Diagnoses: Atrial fibrillation with rapid ventricular response  Discharge Diagnoses:  Active Problems:   Chronic atrial fibrillation (HCC)   Thrombocytopenia (HCC)   Atrial fibrillation with rapid ventricular response (HCC)   COPD mixed type [facility])   Discharged Condition: stable  Hospital Course:   Cory Ryan is a 74 y.o.male with history of mixed CHF EF of 45-50%, CAD s/p CABG, PAF on coumadin, with low risk myoview in January of 2014 who is transferred from [facility] for AF RVR. He presented initially with DOE, occasional CP and some swelling over the last week. He also has occasional palpitations. He states his episodes of CP are in the left lateral chest, burning in nature and unpredictable. They can last minutes to hours. Evaluation at [facility] significant for AF RVR and mildly elevated troponin at 1.5 prompting transfer to [facility].  On arrival he was CP free.  He does not feel palpitation and is overall comfortable. However, his HR is fluctuating from 100-120 bpm on tele.  Patient was admitted and started on IV diltiazem for rate control. He was also given IV Lasix and ultimately diuresed 2.5 L.  Was later changed to Lasix 20 mg daily. Cardizem was changed 120 mg twice daily. Coumadin was continued. Coreg was increased. Patient was started on Lipitor 80 mg. We'll discharge him on 40 mg and follow-up his LFTs and lipids.  LDL cholesterol is 103 with a total cholesterol of 145. Triglycerides 75 and HDL 27 After diuresis the patient was having significant wheezing.  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 Dr. Annamaria Boots recommended he follow-up with his primary care physician who can adjust his medications be evaluated for inhaled steroid-dependent and/or  Spiriva, LAMA.  He was cleared for discharge from their standpoint.  The patient was seen by Dr. Wynonia Lawman who felt he was stable for DC home. He did not have the echo while admitted.  Recommed outpatient study.    Consults: Pulmonology  Significant Diagnostic Studies:  Lipid Panel     Component Value Date/Time   CHOL 145 08/09/2015 0438   TRIG 75 08/09/2015 0438   HDL 27* 08/09/2015 0438   CHOLHDL 5.4 08/09/2015 0438   VLDL 15 08/09/2015 0438   LDLCALC 103* 08/09/2015 0438      CHEST 2 VIEW  COMPARISON: October 29, 2014.  FINDINGS: Stable cardiomegaly. Status post coronary artery bypass graft. No pneumothorax or pleural effusion is noted. Hyperexpansion of the lungs is noted. No acute pulmonary disease is noted. Bony thorax is unremarkable.  IMPRESSION: Findings consistent with chronic obstructive pulmonary disease. No acute cardiopulmonary abnormality seen.  Treatments: See above  Discharge Exam: Blood pressure 134/73, pulse 84, temperature 97.6 F (36.4 C), temperature source Oral, resp. rate 17, height 5\' 10"  (1.778 m), weight 181 lb 6.4 oz (82.283 kg), SpO2 95 %.   Disposition: 01-Home or Self Care      Discharge Instructions    Diet - low sodium heart healthy    Complete by:  As directed      Discharge instructions    Complete by:  As directed   Monitor your weight every morning.  If you gain 3 pounds in 24 hours, or 5 pounds in a week, call the office for instructions.     Increase activity slowly  Complete by:  As directed             Medication List    STOP taking these medications        Dextromethorphan-Guaifenesin 60-1200 MG 12hr tablet      TAKE these medications        albuterol (2.5 MG/3ML) 0.083% nebulizer solution  Commonly known as:  PROVENTIL  Inhale 3 mLs into the lungs every 6 (six) hours as needed. Shortness of breath/wheezing     ALPRAZolam 1 MG tablet  Commonly known as:  XANAX  Take 1 mg by mouth daily as needed for  anxiety.     aspirin 81 MG EC tablet  Take 1 tablet (81 mg total) by mouth daily.     atorvastatin 40 MG tablet  Commonly known as:  LIPITOR  Take 1 tablet (40 mg total) by mouth daily at 6 PM.     carvedilol 6.25 MG tablet  Commonly known as:  COREG  Take 1 tablet (6.25 mg total) by mouth 2 (two) times daily with a meal.     diltiazem 120 MG 24 hr capsule  Commonly known as:  CARDIZEM CD  Take 1 capsule (120 mg total) by mouth 2 (two) times daily.     furosemide 20 MG tablet  Commonly known as:  LASIX  Take 1 tablet (20 mg total) by mouth daily.     HYDROcodone-acetaminophen 5-325 MG tablet  Commonly known as:  NORCO  Take 1-2 tablets by mouth every 4 (four) hours as needed.     Ipratropium-Albuterol 20-100 MCG/ACT Aers respimat  Commonly known as:  COMBIVENT RESPIMAT  Inhale 1 puff into the lungs as needed.     loperamide 2 MG capsule  Commonly known as:  IMODIUM  Take 1 capsule (2 mg total) by mouth as needed for diarrhea or loose stools.     potassium chloride SA 20 MEQ tablet  Commonly known as:  K-DUR,KLOR-CON  Take 20 mEq by mouth daily.     warfarin 5 MG tablet  Commonly known as:  COUMADIN  Take 1 tablet daily except 1/2 tablet on Mondays, Wednesdays and Fridays       Follow-up Information    Follow up with [facility].   Why:  Call this number, follow the prompts and get a new primary care physician   Contact information:   [phone]      Follow up with Rozann Lesches, MD.   Specialty:  Cardiology   Why:  Office will call you to schedule an appointment date and time.   Contact information:   [place] [id number] [phone]      Greater than 30 minutes was spent completing the patient's discharge.   SignedTarri Fuller, [place] 08/11/2015, 3:10 PM

## 2015-08-11 NOTE — Progress Notes (Signed)
Subjective:  Patient walked yesterday and is feeling better today.  Atrial fibrillation rate is controlled.  Objective:  Vital Signs in the last 24 hours: BP 134/73 mmHg  Pulse 84  Temp(Src) 97.6 F (36.4 C) (Oral)  Resp 17  Ht 5\' 10"  (1.778 m)  Wt 82.283 kg (181 lb 6.4 oz)  BMI 26.03 kg/m2  SpO2 95%  Physical Exam: Pleasant male in no acute distress Lungs: Diffuse wheezing bilaterally. Cardiac:  Irregular rhythm, normal S1 and S2, no S3, 2/6 systolic murmur left sternal border with preserved S2 Abdomen:  Soft, nontender, no masses Extremities:  No edema present  Intake/Output from previous day: 10/29 0701 - 10/30 0700 In: 200 [P.O.:200] Out: 950 [Urine:950]  Weight Filed Weights   08/09/15 0455 08/10/15 0500 08/11/15 0400  Weight: 82.464 kg (181 lb 12.8 oz) 82.555 kg (182 lb) 82.283 kg (181 lb 6.4 oz)    Lab Results: Basic Metabolic Panel:  Recent Labs  08/09/15 0438 08/09/15 1013  NA 137 134*  K 3.5 3.6  CL 101 99*  CO2 26 27  GLUCOSE 115* 143*  BUN 22* 21*  CREATININE 1.58* 1.49*   CBC:  Recent Labs  08/09/15 0438 08/10/15  WBC 5.6 6.3  HGB 13.1 13.2  HCT 39.8 40.9  MCV 95.7 96.0  PLT 69* 66*   Cardiac Panel (last 3 results)  Recent Labs  08/08/15 2254 08/09/15 0438 08/09/15 1013  TROPONINI 1.67* 2.05* 1.95*    Telemetry: Atrial fibrillation, rate still somewhat above 100  Assessment/Plan:  1.  Chronic atrial fibrillation-rate somewhat better controlled 2.  Long-term use of anticoagulants 3.  Elevated troponin likely demand ischemia due to rapid atrial fibrillation 4.  Chronic combined systolic and diastolic heart failure 5.  Aortic stenosis moderate previously and moderate mitral regurgitation 6.  Stage III chronic kidney disease 7.  Combined systolic and diastolic congestive heart failure 8.  Active wheezing noted today along with COPD  Recommendations:  He is feeling better today but has significant wheezing.  Prior to going  home and like him to be seen by pulmonary.     Kerry Hough  MD [facility]  08/11/2015, 9:41 AM

## 2015-08-11 NOTE — Consult Note (Signed)
Name: Cory Ryan MRN: [id number] DOB: Nov 24, 1940    ADMISSION DATE:  08/08/2015 CONSULTATION DATE: 08/11/15  REFERRING MD :  Wynonia Lawman  CHIEF COMPLAINT:  wheezing  BRIEF PATIENT DESCRIPTION:  4 yoM former smoker with known COPD. Admitted for chronic AFib with RVR, DOE, fluid retention. Now improved. PCCM asked to evaluate wheezing before discharge.   SIGNIFICANT EVENTS    STUDIES:     HISTORY OF PRESENT ILLNESS:   36 yoM former smoker with known COPD. Admitted for chronic AFib with RVR, DOE, fluid retention. Medial problems reviewed, including CAD, systolic CHF, AFib, AS/MR, thrombocytopenia.  He knows he has COPD. Quit smoking several years ago. Uses nebulizer w  albuterol 1-2x/ day for mild occasional wheeze most days. Sometimes uses Combivent rescue inhaler . Little routine phlegm. Paces himself. [facility]es to [facility] for exacerbation. No regular primary physician, but used to go to Dr Orson Ape and could return to that office. He feels his breathing and mild wheeze are at baseline today.  PAST MEDICAL HISTORY :   has a past medical history of Atrial fibrillation ([place]); Chronic systolic heart failure ([place]); Emphysema; Coronary atherosclerosis of native coronary artery; Gastric erosions (12/2005); Colon polyps (12/2005); Ischemic cardiomyopathy; Aortic stenosis; Mitral regurgitation; Pulmonary nodules; Hypothyroidism; Psoriasis; Myocardial infarction [facility]); CKD (chronic kidney disease) stage 3, GFR 30-59 ml/min; Anemia; Colon cancer ([place]); Atrial fibrillation ([place]); CHF (congestive heart failure) ([place]); Anxiety; Shortness of breath; GERD (gastroesophageal reflux disease); Cardiomyopathy ([place]); and COPD (chronic obstructive pulmonary disease) ([place]).  has past surgical history that includes Coronary artery bypass graft (March 2004); Flexible sigmoidoscopy (10/25/2012); Partial colectomy (10/26/2012); and Colectomy (N/A, 12/13/2013).   Prior to Admission medications   Medication Sig Start Date End  Date Taking? Authorizing Provider  carvedilol (COREG) 3.125 MG tablet TAKE 1 TABLET BY MOUTH TWICE DAILY WITH MEALS. 04/05/15  Yes Lendon Colonel, NP  diltiazem (CARDIZEM CD) 120 MG 24 hr capsule TAKE (1) CAPSULE BY MOUTH EVERY DAY. Patient taking differently: TAKE (1) CAPSULE BY MOUTH AT BEDTIME 08/06/15  Yes Lendon Colonel, NP  furosemide (LASIX) 40 MG tablet Take 1 tablet (40 mg total) by mouth daily. 08/23/14  Yes Lendon Colonel, NP  potassium chloride SA (K-DUR,KLOR-CON) 20 MEQ tablet Take 20 mEq by mouth daily.  02/08/12  Yes Rexene Alberts, MD  warfarin (COUMADIN) 5 MG tablet Take 1 tablet daily except 1/2 tablet on Mondays, Wednesdays and Fridays Patient taking differently: Take 2.5-5 mg by mouth at bedtime. Take 1 tablet daily except 1/2 tablet on Mondays, Wednesdays and Fridays 02/13/15  Yes [name], MD  albuterol (PROVENTIL) (2.5 MG/3ML) 0.083% nebulizer solution Inhale 3 mLs into the lungs every 6 (six) hours as needed. Shortness of breath/wheezing 08/06/15   Historical Provider, MD  ALPRAZolam Duanne Moron) 1 MG tablet Take 1 mg by mouth daily as needed for anxiety.  10/28/12   Historical Provider, MD  Dextromethorphan-Guaifenesin 60-1200 MG per 12 hr tablet Take 1 tablet by mouth every 12 (twelve) hours. Patient not taking: Reported on 08/08/2015 07/02/14   Lendon Colonel, NP  HYDROcodone-acetaminophen (NORCO) 5-325 MG per tablet Take 1-2 tablets by mouth every 4 (four) hours as needed. Patient taking differently: Take 1 tablet by mouth every 6 (six) hours as needed for moderate pain.  12/18/13   Aviva Signs, MD  Ipratropium-Albuterol (COMBIVENT RESPIMAT) 20-100 MCG/ACT AERS respimat Inhale 1 puff into the lungs as needed. Patient taking differently: Inhale 1 puff into the lungs as needed for wheezing or shortness of  breath.  06/15/13   [name], MD  loperamide (IMODIUM) 2 MG capsule Take 1 capsule (2 mg total) by mouth as needed for diarrhea or loose stools. 10/29/12    Chelsea Primus, MD   No Known Allergies  FAMILY HISTORY:  family history includes Cancer in his brother; Heart Problems in his mother; Hypertension in his other. SOCIAL HISTORY:  reports that he quit smoking about 12 years ago. His smoking use included Cigarettes. He has a 30 pack-year smoking history. He has never used smokeless tobacco. He reports that he does not drink alcohol or use illicit drugs.  REVIEW OF SYSTEMS:  += pos Constitutional: Negative for fever, chills, weight loss, malaise/fatigue and diaphoresis.  HENT: Negative for hearing loss, ear pain, nosebleeds, congestion, sore throat, neck pain, tinnitus and ear discharge.   Eyes: Negative for blurred vision, double vision, photophobia, pain, discharge and redness.  Respiratory: Negative for cough, hemoptysis, sputum production,+ shortness of breath, +wheezing and stridor.   Cardiovascular: Negative for chest pain, palpitations, orthopnea, claudication+ leg swelling and PND.  Gastrointestinal: Negative for heartburn, nausea, vomiting, abdominal pain, diarrhea, constipation, blood in stool and melena.  Genitourinary:   Musculoskeletal: Negative for myalgias, back pain, joint pain and falls.  Skin: Negative for itching and rash.  Neurological: Negative for dizziness,  Endo/Heme/Allergies: Negative for environmental allergies and polydipsia.   SUBJECTIVE:   VITAL SIGNS: Temp:  [97.2 F (36.2 C)-97.8 F (36.6 C)] 97.6 F (36.4 C) (10/30 0256) Pulse Rate:  [79-102] 84 (10/30 0746) Resp:  [15-20] 17 (10/30 0400) BP: (114-146)/(68-87) 134/73 mmHg (10/30 0746) SpO2:  [93 %-98 %] 95 % (10/30 0400) Weight:  [82.283 kg (181 lb 6.4 oz)] 82.283 kg (181 lb 6.4 oz) (10/30 0400)  PHYSICAL EXAMINATION: General: Calm, cooperative, NAD Neuro:  Grossly intact and nonfocal HEENT:  PERR, speech clear, no stridor Cardiovascular: IRR, don't hear murmur Lungs:  Trace bibasilar wheeze and crackle, unlabored Abdomen:  Soft,  BS+ Musculoskeletal:  Normal muscle bulk Skin:  No rash  I personally reviewed current CXR images and compared with some priors. Persistent cardiac enlargement and interstitial prominence which could be interstitial edema and some scarring.   Recent Labs Lab 08/08/15 1713 08/09/15 0438 08/09/15 1013  NA 140 137 134*  K 4.5 3.5 3.6  CL 105 101 99*  CO2 26 26 27   BUN 21* 22* 21*  CREATININE 1.38* 1.58* 1.49*  GLUCOSE 118* 115* 143*    Recent Labs Lab 08/08/15 1713 08/09/15 0438 08/10/15  HGB 13.2 13.1 13.2  HCT 40.7 39.8 40.9  WBC 5.7 5.6 6.3  PLT 63* 69* 66*   No results found.  ASSESSMENT / PLAN:  COPD mixed type without acute exacerbation. He has bronchodilators at home. He needs to establish with a primary physician who can adjust meds. Ideally he would be evaluated for an inhaled steroid and/or Spiriva/ LAMA,. These could reduce need for stimulant bronchodilator.. I don't think he would make much of a short trial script at discharge and I don't think he needs acute intervention. Ok from pulmonary standpoint to go home. Would emphasize need to go back to his PCP office.  Pulmonary and [place] Pager: [phone]  08/11/2015, 11:24 AM

## 2015-08-30 ENCOUNTER — Encounter: Payer: Self-pay | Admitting: Cardiology

## 2015-08-30 ENCOUNTER — Ambulatory Visit (INDEPENDENT_AMBULATORY_CARE_PROVIDER_SITE_OTHER): Payer: Medicare Other | Admitting: Cardiology

## 2015-08-30 VITALS — BP 130/70 | HR 72 | Ht 70.0 in | Wt 179.0 lb

## 2015-08-30 DIAGNOSIS — N183 Chronic kidney disease, stage 3 unspecified: Secondary | ICD-10-CM

## 2015-08-30 DIAGNOSIS — I481 Persistent atrial fibrillation: Secondary | ICD-10-CM | POA: Diagnosis not present

## 2015-08-30 DIAGNOSIS — I2510 Atherosclerotic heart disease of native coronary artery without angina pectoris: Secondary | ICD-10-CM | POA: Diagnosis not present

## 2015-08-30 DIAGNOSIS — I4819 Other persistent atrial fibrillation: Secondary | ICD-10-CM

## 2015-08-30 DIAGNOSIS — I350 Nonrheumatic aortic (valve) stenosis: Secondary | ICD-10-CM | POA: Diagnosis not present

## 2015-08-30 DIAGNOSIS — I340 Nonrheumatic mitral (valve) insufficiency: Secondary | ICD-10-CM

## 2015-08-30 DIAGNOSIS — I429 Cardiomyopathy, unspecified: Secondary | ICD-10-CM | POA: Diagnosis not present

## 2015-08-30 NOTE — Progress Notes (Signed)
Cardiology Office Note  Date: 08/30/2015   ID: Ociel, Mize 11/26/1940, MRN [id number]  PCP: Collene Mares, PA-C  Primary Cardiologist: Cory Lesches, MD   Chief Complaint  Patient presents with  . Hospitalization Follow-up  . Atrial Fibrillation  . Coronary Artery Disease    History of Present Illness: Cory Ryan is a medically complex 74 y.o. male that I last saw in the office in September 2014. He has had interval follow-up with Cory Ryan, and I see that he was recently admitted to the hospital in October with atrial fibrillation associated with RVR requiring medication adjustments. He comes in today with his son for a follow-up visit, states that he feels better. His heart rate is much better controlled today, he is less short of breath. He does not report having any chest pain.  Troponin I levels peaked at 2.05 during his hospital stay, ischemic workup was not further undertaken by our cardiology team at that time - felt to be secondary to demand ischemia in the setting of RVR most likely.  Recent echocardiogram from October is reviewed below, LVEF 35-40% range, moderate mitral regurgitation, and probable moderate aortic stenosis, although it was not well described in the report.  We discussed his medications today. He is on combination of beta blocker and calcium channel blocker for heart rate control. With his cardiomyopathy, this may ultimately need to be adjusted, but it is possible that he has had a tachycardia mediated reduction in LVEF as well, therefore making heart rate control imperative.   Past Medical History  Diagnosis Date  . Atrial fibrillation ([place])   . Chronic systolic heart failure (HCC)     LVEF 40-45%  . Emphysema   . Coronary atherosclerosis of native coronary artery     Multivessel status post CABG  . Gastric erosions 12/2005    Per EGD - Dr. Gala Ryan  . Colon polyps 12/2005    Per colonoscopy  . Ischemic cardiomyopathy   . Aortic  stenosis     Moderate  . Mitral regurgitation     Moderate  . Pulmonary nodules   . Hypothyroidism   . Psoriasis   . Myocardial infarction [facility])     Remote  . CKD (chronic kidney disease) stage 3, GFR 30-59 ml/min   . Anemia   . Colon cancer ([place] Ryan)   . Anxiety   . GERD (gastroesophageal reflux disease)   . COPD (chronic obstructive pulmonary disease) [facility])     Past Surgical History  Procedure Laterality Date  . Coronary artery bypass graft  March 2004    LIMA to LAD, SVG to diagonal, SVG to OM 2, SVG to RCA  . Flexible sigmoidoscopy  10/25/2012    Procedure: FLEXIBLE SIGMOIDOSCOPY;  Surgeon: Cory So, MD;  Location: [facility] ENDO SUITE;  Service: Gastroenterology;  Laterality: N/A;  . Partial colectomy  10/26/2012    Procedure: PARTIAL COLECTOMY;  Surgeon: Cory So, MD;  Location: [facility] ORS;  Service: General;  Laterality: N/A;  Closure of Colovesical Fistula  . Colectomy N/A 12/13/2013    Procedure: TOTAL COLECTOMY;  Surgeon: Cory So, MD;  Location: [facility] ORS;  Service: General;  Laterality: N/A;    Current Outpatient Prescriptions  Medication Sig Dispense Refill  . albuterol (PROVENTIL) (2.5 MG/3ML) 0.083% nebulizer solution Inhale 3 mLs into the lungs every 6 (six) hours as needed. Shortness of breath/wheezing    . ALPRAZolam (XANAX) 1 MG tablet Take 1 mg by mouth daily as  needed for anxiety.     [name] atorvastatin (LIPITOR) 40 MG tablet Take 1 tablet (40 mg total) by mouth daily at 6 PM. 30 tablet 5  . carvedilol (COREG) 6.25 MG tablet Take 1 tablet (6.25 mg total) by mouth 2 (two) times daily with a meal. 60 tablet 11  . diltiazem (CARDIZEM CD) 120 MG 24 hr capsule Take 1 capsule (120 mg total) by mouth 2 (two) times daily. 60 capsule 11  . furosemide (LASIX) 20 MG tablet Take 1 tablet (20 mg total) by mouth daily. 30 tablet 11  . HYDROcodone-acetaminophen (NORCO) 5-325 MG per tablet Take 1-2 tablets by mouth every 4 (four) hours as needed. (Patient taking differently: Take 1  tablet by mouth every 6 (six) hours as needed for moderate pain. ) 40 tablet 0  . Ipratropium-Albuterol (COMBIVENT RESPIMAT) 20-100 MCG/ACT AERS respimat Inhale 1 puff into the lungs as needed. (Patient taking differently: Inhale 1 puff into the lungs as needed for wheezing or shortness of breath. ) 1 Inhaler 1  . loperamide (IMODIUM) 2 MG capsule Take 1 capsule (2 mg total) by mouth as needed for diarrhea or loose stools. 20 capsule 2  . potassium chloride SA (K-DUR,KLOR-CON) 20 MEQ tablet Take 20 mEq by mouth daily.     [name] warfarin (COUMADIN) 5 MG tablet Take 1 tablet daily except 1/2 tablet on Mondays, Wednesdays and Fridays (Patient taking differently: Take 2.5-5 mg by mouth at bedtime. Take 1 tablet daily except 1/2 tablet on Mondays, Wednesdays and Fridays) 30 tablet 6   No current facility-administered medications for this visit.    Allergies:  Review of patient's allergies indicates no known allergies.   Social History: The patient  reports that he quit smoking about 12 years ago. His smoking use included Cigarettes. He has a 30 pack-year smoking history. He has never used smokeless tobacco. He reports that he does not drink alcohol or use illicit drugs.   ROS:  Please see the history of present illness. Otherwise, complete review of systems is positive for decreased hearing.  All other systems are reviewed and negative.   Physical Exam: VS:  BP 130/70 mmHg  Pulse 72  Ht 5\' 10"  (1.778 m)  Wt 179 lb (81.194 kg)  BMI 25.68 kg/m2  SpO2 96%, BMI Body mass index is 25.68 kg/(m^2).  Wt Readings from Last 3 Encounters:  08/30/15 179 lb (81.194 kg)  08/11/15 181 lb 6.4 oz (82.283 kg)  10/04/14 169 lb 1.5 oz (76.7 kg)     General: Elderly male, appears comfortable at rest. HEENT: Conjunctiva and lids normal, oropharynx clear. Neck: Supple, no elevated JVP or carotid bruits, no thyromegaly. Lungs: Decreased breath sounds with prolonged expiratory phase and rhonchi, nonlabored breathing  at rest. Cardiac: Irregularly irregular, no S3, 2/6 systolic murmur, no pericardial rub. Abdomen: Soft, nontender, bowel sounds present, no guarding or rebound. Extremities: Bilateral venous stasis, 1+ edema, distal pulses 2+. Skin: Warm and dry. Musculoskeletal: No kyphosis. Neuropsychiatric: Alert and oriented x3, affect grossly appropriate.   ECG: ECG is not ordered today.  Recent Labwork: 10/03/2014: ALT 20; AST 21 08/08/2015: B Natriuretic Peptide 725.0*; TSH 1.317 08/09/2015: BUN 21*; Creatinine, Ser 1.49*; Potassium 3.6; Sodium 134* 08/10/2015: Hemoglobin 13.2; Platelets 66*     Component Value Date/Time   CHOL 145 08/09/2015 0438   TRIG 75 08/09/2015 0438   HDL 27* 08/09/2015 0438   CHOLHDL 5.4 08/09/2015 0438   VLDL 15 08/09/2015 0438   LDLCALC 103* 08/09/2015 0438    Other  Studies Reviewed Today:  Echocardiogram 08/11/2015: Study Conclusion  - Left ventricle: The cavity size was normal. Wall thickness was increased in a pattern of moderate LVH. Systolic function was moderately reduced. The estimated ejection fraction was in the range of 35% to 40%. Wall motion was normal; there were no regional wall motion abnormalities. - Aortic valve: Valve area (VTI): 1.2 cm^2. Valve area (Vmax): 1.5 cm^2. Valve area (Vmean): 1.22 cm^2. - Mitral valve: Calcified annulus. There was moderate regurgitation. Valve area by continuity equation (using LVOT flow): 0.98 cm^2. - Left atrium: The atrium was severely dilated. - Right ventricle: Systolic function was mildly to moderately reduced. - Tricuspid valve: There was mild-moderate regurgitation. - Pulmonary arteries: Systolic pressure was mildly to moderately increased. PA peak pressure: 44 mm Hg (S).  ASSESSMENT AND PLAN:  1. Persistent atrial fibrillation, heart rate control is better following medication adjustments during recent hospital stay. He is on combination of beta blocker and calcium channel  blocker, to be continued at this time. Otherwise continues on Coumadin for stroke prophylaxis.  2. CAD status post CABG in 2004. Recent evidence of demand ischemia in the setting of RVR, no active chest pain at this time. We will stop aspirin since he is concurrently on Coumadin.  3. Cardiomyopathy, LVEF 35-40% by most recent assessment. Could be component of tachycardia-mediated reduction in LVEF with recent RVR. Continue further efforts at heart rate control, and then plan to reassess LVEF.  4. Moderate aortic stenosis and mitral regurgitation, stable by recent echocardiogram.  5. COPD, also component of dyspnea on exertion. He follows with primary care.  6. CKD, stage 3. Recent creatinine 1.4.  Current medicines were reviewed at length with the patient today.  Disposition: FU with m3 in 4-6 weeks.   Signed, [name], MD, [facility] 08/30/2015 10:08 AM    [place] [facility] [place]. [address] [place], [place] [address] Phone: [phone]; Fax: [phone]

## 2015-08-30 NOTE — Patient Instructions (Signed)
Your physician recommends that you schedule a follow-up appointment in: 4 -6 weeks with Dr Domenic Polite    STOP Aspirin    If you need a refill on your cardiac medications before your next appointment, please call your pharmacy.      Thank you for choosing [facility] !

## 2015-09-04 ENCOUNTER — Ambulatory Visit (INDEPENDENT_AMBULATORY_CARE_PROVIDER_SITE_OTHER): Payer: Medicare Other | Admitting: *Deleted

## 2015-09-04 DIAGNOSIS — I480 Paroxysmal atrial fibrillation: Secondary | ICD-10-CM | POA: Diagnosis not present

## 2015-09-04 DIAGNOSIS — Z5181 Encounter for therapeutic drug level monitoring: Secondary | ICD-10-CM | POA: Diagnosis not present

## 2015-09-04 LAB — POCT INR: INR: 2.7

## 2015-09-25 ENCOUNTER — Telehealth: Payer: Self-pay | Admitting: Cardiology

## 2015-09-25 ENCOUNTER — Other Ambulatory Visit: Payer: Self-pay | Admitting: *Deleted

## 2015-09-25 MED ORDER — DILTIAZEM HCL ER COATED BEADS 120 MG PO CP24: 120.0000 mg | ORAL_CAPSULE | Freq: Two times a day (BID) | ORAL | Status: DC

## 2015-09-25 NOTE — Telephone Encounter (Signed)
Pt is almost out of his Carizem, he's scheduled to see [name] on 1/4. Pt uses [facility] in [place]

## 2015-09-30 ENCOUNTER — Ambulatory Visit (INDEPENDENT_AMBULATORY_CARE_PROVIDER_SITE_OTHER): Payer: Medicare Other | Admitting: *Deleted

## 2015-09-30 DIAGNOSIS — Z5181 Encounter for therapeutic drug level monitoring: Secondary | ICD-10-CM

## 2015-09-30 DIAGNOSIS — I480 Paroxysmal atrial fibrillation: Secondary | ICD-10-CM

## 2015-09-30 LAB — POCT INR: INR: 1.8

## 2015-10-16 ENCOUNTER — Encounter: Payer: Self-pay | Admitting: Cardiology

## 2015-10-16 ENCOUNTER — Ambulatory Visit (INDEPENDENT_AMBULATORY_CARE_PROVIDER_SITE_OTHER): Payer: Medicare Other | Admitting: Cardiology

## 2015-10-16 VITALS — BP 112/64 | HR 73 | Ht 70.0 in | Wt 179.0 lb

## 2015-10-16 DIAGNOSIS — I428 Other cardiomyopathies: Secondary | ICD-10-CM

## 2015-10-16 DIAGNOSIS — I482 Chronic atrial fibrillation, unspecified: Secondary | ICD-10-CM

## 2015-10-16 DIAGNOSIS — I429 Cardiomyopathy, unspecified: Secondary | ICD-10-CM | POA: Diagnosis not present

## 2015-10-16 DIAGNOSIS — I2510 Atherosclerotic heart disease of native coronary artery without angina pectoris: Secondary | ICD-10-CM | POA: Diagnosis not present

## 2015-10-16 NOTE — Patient Instructions (Signed)
Your physician recommends that you schedule a follow-up appointment in: 3 months with Dr Domenic Polite   Your physician recommends that you continue on your current medications as directed. Please refer to the Current Medication list given to you today.    If you need a refill on your cardiac medications before your next appointment, please call your pharmacy.    Your physician has requested that you have an echocardiogram. Echocardiography is a painless test that uses sound waves to create images of your heart. It provides your doctor with information about the size and shape of your heart and how well your heart's chambers and valves are working. This procedure takes approximately one hour. There are no restrictions for this procedure.       Thank you for choosing [facility] !

## 2015-10-16 NOTE — Progress Notes (Signed)
Cardiology Office Note  Date: 10/16/2015   ID: Eno, Denecke 08-18-1941, MRN [id number]  PCP: Collene Mares, PA-C  Primary Cardiologist: Rozann Lesches, MD   Chief Complaint  Patient presents with  . Atrial Fibrillation  . Cardiomyopathy  . Coronary Artery Disease    History of Present Illness: Cory Ryan is a 75 y.o. male last seen in November. He is here today with his son for a follow-up visit. Overall, reports that he has been doing well, no major change in functional capacity, no palpitations or unusual shortness of breath. He reports compliance with his medications.  He continues on Coumadin with follow-up in the anticoagulation clinic. Does not report any bleeding problems.  Heart rate today is well-controlled in the 70s in atrial fibrillation. He is on Coreg and Cardizem CD. We did discuss obtaining a follow-up echocardiogram for reassessment of LVEF. It is possible that he had a tachycardia-mediated cardiomyopathy.  Past Medical History  Diagnosis Date  . Atrial fibrillation ([place])   . Chronic systolic heart failure (HCC)     LVEF 40-45%  . Emphysema   . Coronary atherosclerosis of native coronary artery     Multivessel status post CABG  . Gastric erosions 12/2005    Per EGD - Dr. [name]  . Colon polyps 12/2005    Per colonoscopy  . Ischemic cardiomyopathy   . Aortic stenosis     Moderate  . Mitral regurgitation     Moderate  . Pulmonary nodules   . Hypothyroidism   . Psoriasis   . Myocardial infarction [facility])     Remote  . CKD (chronic kidney disease) stage 3, GFR 30-59 ml/min   . Anemia   . Colon cancer ([place])   . Anxiety   . GERD (gastroesophageal reflux disease)   . COPD (chronic obstructive pulmonary disease) ([place])     Current Outpatient Prescriptions  Medication Sig Dispense Refill  . albuterol (PROVENTIL) (2.5 MG/3ML) 0.083% nebulizer solution Inhale 3 mLs into the lungs every 6 (six) hours as needed. Shortness of breath/wheezing    .  ALPRAZolam (XANAX) 1 MG tablet Take 1 mg by mouth daily as needed for anxiety.     . carvedilol (COREG) 6.25 MG tablet Take 1 tablet (6.25 mg total) by mouth 2 (two) times daily with a meal. 60 tablet 11  . diltiazem (CARDIZEM CD) 120 MG 24 hr capsule Take 1 capsule (120 mg total) by mouth 2 (two) times daily. 30 capsule 3  . furosemide (LASIX) 20 MG tablet Take 1 tablet (20 mg total) by mouth daily. 30 tablet 11  . HYDROcodone-acetaminophen (NORCO) 5-325 MG per tablet Take 1-2 tablets by mouth every 4 (four) hours as needed. (Patient taking differently: Take 1 tablet by mouth every 6 (six) hours as needed for moderate pain. ) 40 tablet 0  . Ipratropium-Albuterol (COMBIVENT RESPIMAT) 20-100 MCG/ACT AERS respimat Inhale 1 puff into the lungs as needed. (Patient taking differently: Inhale 1 puff into the lungs as needed for wheezing or shortness of breath. ) 1 Inhaler 1  . loperamide (IMODIUM) 2 MG capsule Take 1 capsule (2 mg total) by mouth as needed for diarrhea or loose stools. 20 capsule 2  . potassium chloride SA (K-DUR,KLOR-CON) 20 MEQ tablet Take 20 mEq by mouth daily.     [name] warfarin (COUMADIN) 5 MG tablet Take 1 tablet daily except 1/2 tablet on Mondays, Wednesdays and Fridays (Patient taking differently: Take 2.5-5 mg by mouth at bedtime. Take 1 tablet daily  except 1/2 tablet on Mondays and Thursdays) 30 tablet 6   No current facility-administered medications for this visit.   Allergies:  Review of patient's allergies indicates no known allergies.   Social History: The patient  reports that he quit smoking about 12 years ago. His smoking use included Cigarettes. He has a 30 pack-year smoking history. He has never used smokeless tobacco. He reports that he does not drink alcohol or use illicit drugs.   ROS:  Please see the history of present illness. Otherwise, complete review of systems is positive for decreased hearing.  All other systems are reviewed and negative.   Physical Exam: VS:   BP 112/64 mmHg  Pulse 73  Ht 5\' 10"  (1.778 m)  Wt 179 lb (81.194 kg)  BMI 25.68 kg/m2  SpO2 95%, BMI Body mass index is 25.68 kg/(m^2).  Wt Readings from Last 3 Encounters:  10/16/15 179 lb (81.194 kg)  08/30/15 179 lb (81.194 kg)  08/11/15 181 lb 6.4 oz (82.283 kg)    General: Elderly male, appears comfortable at rest. HEENT: Conjunctiva and lids normal, oropharynx clear. Neck: Supple, no elevated JVP or carotid bruits, no thyromegaly. Lungs: Decreased breath sounds with prolonged expiratory phase and rhonchi, nonlabored breathing at rest. Cardiac: Irregularly irregular, no S3, 2/6 systolic murmur, no pericardial rub. Abdomen: Soft, nontender, bowel sounds present, no guarding or rebound. Extremities: Bilateral venous stasis, 1+ edema, distal pulses 2+.  ECG: ECG is not ordered today.  Recent Labwork: 08/08/2015: B Natriuretic Peptide 725.0*; TSH 1.317 08/09/2015: BUN 21*; Creatinine, Ser 1.49*; Potassium 3.6; Sodium 134* 08/10/2015: Hemoglobin 13.2; Platelets 66*     Component Value Date/Time   CHOL 145 08/09/2015 0438   TRIG 75 08/09/2015 0438   HDL 27* 08/09/2015 0438   CHOLHDL 5.4 08/09/2015 0438   VLDL 15 08/09/2015 0438   LDLCALC 103* 08/09/2015 0438    Other Studies Reviewed Today:  Echocardiogram 08/11/2015: Study Conclusion  - Left ventricle: The cavity size was normal. Wall thickness was increased in a pattern of moderate LVH. Systolic function was moderately reduced. The estimated ejection fraction was in the range of 35% to 40%. Wall motion was normal; there were no regional wall motion abnormalities. - Aortic valve: Valve area (VTI): 1.2 cm^2. Valve area (Vmax): 1.5 cm^2. Valve area (Vmean): 1.22 cm^2. - Mitral valve: Calcified annulus. There was moderate regurgitation. Valve area by continuity equation (using LVOT flow): 0.98 cm^2. - Left atrium: The atrium was severely dilated. - Right ventricle: Systolic function was mildly to  moderately reduced. - Tricuspid valve: There was mild-moderate regurgitation. - Pulmonary arteries: Systolic pressure was mildly to moderately increased. PA peak pressure: 44 mm Hg (S).  Assessment and Plan:  1. Persistent atrial fibrillation. Plan to continue current heart rate control regimen and Coumadin. We will obtain a follow-up echocardiogram to reassess LVEF.  2. CAD status post CABG in 2004. No active angina symptoms at this time.  3. Moderate aortic stenosis and moderate mitral regurgitation.  Current medicines were reviewed with the patient today.   Orders Placed This Encounter  Procedures  . Echocardiogram    Disposition: FU with me in 3 months.   Signed, [name], MD, [facility] 10/16/2015 2:10 PM    [place] at [facility] [address] [place], [place] [address] Phone: [phone]; Fax: [phone]

## 2015-10-22 ENCOUNTER — Other Ambulatory Visit (HOSPITAL_COMMUNITY): Payer: Medicare Other

## 2015-10-23 ENCOUNTER — Ambulatory Visit (HOSPITAL_COMMUNITY)
Admission: RE | Admit: 2015-10-23 | Discharge: 2015-10-23 | Disposition: A | Discharge status: Home / Self Care | Payer: Medicare Other | Source: Ambulatory Visit | Attending: Cardiology | Admitting: Cardiology

## 2015-10-23 ENCOUNTER — Telehealth: Payer: Self-pay | Admitting: *Deleted

## 2015-10-23 DIAGNOSIS — I4891 Unspecified atrial fibrillation: Secondary | ICD-10-CM | POA: Diagnosis not present

## 2015-10-23 DIAGNOSIS — I509 Heart failure, unspecified: Secondary | ICD-10-CM | POA: Diagnosis not present

## 2015-10-23 DIAGNOSIS — I352 Nonrheumatic aortic (valve) stenosis with insufficiency: Secondary | ICD-10-CM | POA: Insufficient documentation

## 2015-10-23 DIAGNOSIS — I371 Nonrheumatic pulmonary valve insufficiency: Secondary | ICD-10-CM | POA: Diagnosis not present

## 2015-10-23 DIAGNOSIS — I081 Rheumatic disorders of both mitral and tricuspid valves: Secondary | ICD-10-CM | POA: Insufficient documentation

## 2015-10-23 DIAGNOSIS — I482 Chronic atrial fibrillation, unspecified: Secondary | ICD-10-CM

## 2015-10-23 DIAGNOSIS — I429 Cardiomyopathy, unspecified: Secondary | ICD-10-CM

## 2015-10-23 DIAGNOSIS — I428 Other cardiomyopathies: Secondary | ICD-10-CM

## 2015-10-23 NOTE — Telephone Encounter (Signed)
Called patient with test results. No answer. Unable to leave message.  

## 2015-10-23 NOTE — Telephone Encounter (Signed)
-----   Message from [name], MD sent at 10/23/2015  2:16 PM EST ----- Reviewed report. There has been mild improvement in LVEF, in the range of 40-45%. Would recommend continuing medical therapy for control of atrial fibrillation. He is also on a diuretic, need to follow weights daily. Keep scheduled office visit.

## 2015-10-28 ENCOUNTER — Ambulatory Visit (INDEPENDENT_AMBULATORY_CARE_PROVIDER_SITE_OTHER): Payer: Medicare Other | Admitting: *Deleted

## 2015-10-28 DIAGNOSIS — Z5181 Encounter for therapeutic drug level monitoring: Secondary | ICD-10-CM

## 2015-10-28 DIAGNOSIS — I480 Paroxysmal atrial fibrillation: Secondary | ICD-10-CM

## 2015-10-28 LAB — POCT INR: INR: 2.2

## 2015-10-28 MED ORDER — WARFARIN SODIUM 5 MG PO TABS: ORAL_TABLET | ORAL | Status: DC

## 2015-10-29 ENCOUNTER — Emergency Department (HOSPITAL_COMMUNITY)
Admission: EM | Admit: 2015-10-29 | Discharge: 2015-10-29 | Disposition: A | Discharge status: Home / Self Care | Payer: Medicare Other | Attending: Emergency Medicine | Admitting: Emergency Medicine

## 2015-10-29 ENCOUNTER — Encounter (HOSPITAL_COMMUNITY): Payer: Self-pay | Admitting: Cardiology

## 2015-10-29 ENCOUNTER — Emergency Department (HOSPITAL_COMMUNITY): Payer: Medicare Other

## 2015-10-29 DIAGNOSIS — Z8639 Personal history of other endocrine, nutritional and metabolic disease: Secondary | ICD-10-CM | POA: Diagnosis not present

## 2015-10-29 DIAGNOSIS — Z8719 Personal history of other diseases of the digestive system: Secondary | ICD-10-CM | POA: Diagnosis not present

## 2015-10-29 DIAGNOSIS — Z87891 Personal history of nicotine dependence: Secondary | ICD-10-CM | POA: Insufficient documentation

## 2015-10-29 DIAGNOSIS — N183 Chronic kidney disease, stage 3 (moderate): Secondary | ICD-10-CM | POA: Insufficient documentation

## 2015-10-29 DIAGNOSIS — Z872 Personal history of diseases of the skin and subcutaneous tissue: Secondary | ICD-10-CM | POA: Insufficient documentation

## 2015-10-29 DIAGNOSIS — I2510 Atherosclerotic heart disease of native coronary artery without angina pectoris: Secondary | ICD-10-CM | POA: Diagnosis not present

## 2015-10-29 DIAGNOSIS — I4891 Unspecified atrial fibrillation: Secondary | ICD-10-CM | POA: Insufficient documentation

## 2015-10-29 DIAGNOSIS — R0602 Shortness of breath: Secondary | ICD-10-CM | POA: Diagnosis present

## 2015-10-29 DIAGNOSIS — I5023 Acute on chronic systolic (congestive) heart failure: Secondary | ICD-10-CM | POA: Diagnosis not present

## 2015-10-29 DIAGNOSIS — Z79899 Other long term (current) drug therapy: Secondary | ICD-10-CM | POA: Diagnosis not present

## 2015-10-29 DIAGNOSIS — Z951 Presence of aortocoronary bypass graft: Secondary | ICD-10-CM | POA: Insufficient documentation

## 2015-10-29 DIAGNOSIS — F419 Anxiety disorder, unspecified: Secondary | ICD-10-CM | POA: Insufficient documentation

## 2015-10-29 DIAGNOSIS — I252 Old myocardial infarction: Secondary | ICD-10-CM | POA: Insufficient documentation

## 2015-10-29 DIAGNOSIS — D696 Thrombocytopenia, unspecified: Secondary | ICD-10-CM

## 2015-10-29 DIAGNOSIS — Z86010 Personal history of colonic polyps: Secondary | ICD-10-CM | POA: Diagnosis not present

## 2015-10-29 DIAGNOSIS — Z85038 Personal history of other malignant neoplasm of large intestine: Secondary | ICD-10-CM | POA: Diagnosis not present

## 2015-10-29 DIAGNOSIS — Z7901 Long term (current) use of anticoagulants: Secondary | ICD-10-CM | POA: Diagnosis not present

## 2015-10-29 DIAGNOSIS — J441 Chronic obstructive pulmonary disease with (acute) exacerbation: Secondary | ICD-10-CM | POA: Diagnosis not present

## 2015-10-29 LAB — BASIC METABOLIC PANEL
ANION GAP: 9 (ref 5–15)
BUN: 16 mg/dL (ref 6–20)
CALCIUM: 9.3 mg/dL (ref 8.9–10.3)
CO2: 27 mmol/L (ref 22–32)
Chloride: 103 mmol/L (ref 101–111)
Creatinine, Ser: 1.2 mg/dL (ref 0.61–1.24)
GFR calc Af Amer: >60 mL/min (ref >60)
GFR calc non Af Amer: 57 mL/min — ABNORMAL LOW (ref >60)
GLUCOSE: 118 mg/dL — AB (ref 65–99)
Potassium: 4.3 mmol/L (ref 3.5–5.1)
Sodium: 139 mmol/L (ref 135–145)

## 2015-10-29 LAB — CBC WITH DIFFERENTIAL/PLATELET
BASOS ABS: 0 10*3/uL (ref 0.0–0.1)
Basophils Relative: 0 %
EOS PCT: 0 %
Eosinophils Absolute: 0 10*3/uL (ref 0.0–0.7)
HCT: 46.1 % (ref 39.0–52.0)
Hemoglobin: 14.9 g/dL (ref 13.0–17.0)
LYMPHS PCT: 6 %
Lymphs Abs: 0.7 10*3/uL (ref 0.7–4.0)
MCH: 31.2 pg (ref 26.0–34.0)
MCHC: 32.3 g/dL (ref 30.0–36.0)
MCV: 96.6 fL (ref 78.0–100.0)
Monocytes Absolute: 0.8 10*3/uL (ref 0.1–1.0)
Monocytes Relative: 7 %
NEUTROS PCT: 87 %
Neutro Abs: 10.2 10*3/uL — ABNORMAL HIGH (ref 1.7–7.7)
PLATELETS: 66 10*3/uL — AB (ref 150–400)
RBC: 4.77 MIL/uL (ref 4.22–5.81)
RDW: 14.9 % (ref 11.5–15.5)
WBC: 11.7 10*3/uL — AB (ref 4.0–10.5)

## 2015-10-29 LAB — BRAIN NATRIURETIC PEPTIDE: B Natriuretic Peptide: 720 pg/mL — ABNORMAL HIGH (ref 0.0–100.0)

## 2015-10-29 LAB — TROPONIN I: Troponin I: <0.03 ng/mL (ref <0.031)

## 2015-10-29 MED ORDER — PREDNISONE 50 MG PO TABS: 50.0000 mg | ORAL_TABLET | Freq: Every day | ORAL | Status: DC

## 2015-10-29 MED ORDER — IPRATROPIUM-ALBUTEROL 0.5-2.5 (3) MG/3ML IN SOLN
3.0000 mL | Freq: Once | RESPIRATORY_TRACT | Status: AC
  Administered 2015-10-29: 3 mL via RESPIRATORY_TRACT
  Filled 2015-10-29: qty 3

## 2015-10-29 MED ORDER — PREDNISONE 50 MG PO TABS
60.0000 mg | ORAL_TABLET | Freq: Once | ORAL | Status: AC
  Administered 2015-10-29: 60 mg via ORAL
  Filled 2015-10-29: qty 1

## 2015-10-29 MED ORDER — FUROSEMIDE 10 MG/ML IJ SOLN
20.0000 mg | Freq: Once | INTRAMUSCULAR | Status: AC
  Administered 2015-10-29: 20 mg via INTRAVENOUS
  Filled 2015-10-29: qty 2

## 2015-10-29 MED ORDER — HYDROCODONE-ACETAMINOPHEN 5-325 MG PO TABS
1.0000 | ORAL_TABLET | Freq: Once | ORAL | Status: AC
  Administered 2015-10-29: 1 via ORAL
  Filled 2015-10-29: qty 1

## 2015-10-29 NOTE — ED Provider Notes (Signed)
CSN: [id number]     Arrival date & time 10/29/15  1332 History   First MD Initiated Contact with Patient 10/29/15 1657     Chief Complaint  Patient presents with  . Shortness of Breath     (Consider location/radiation/quality/duration/timing/severity/associated sxs/prior Treatment) Patient is a 75 y.o. male presenting with shortness of breath. The history is provided by the patient.  Shortness of Breath He complains of shortness of breath over the last 2 days, which has been getting worse. Dyspnea is worse when he lays flat. There is associated nonproductive cough. He denies chest pain, heaviness, tightness, pressure. He denies fever, chills, sweats. There's been no nausea or vomiting. He has noted some increase in his ankle swelling. He has known history of congestive heart failure, COPD, and atrial fibrillation. He is anticoagulated on warfarin.  Past Medical History  Diagnosis Date  . Atrial fibrillation ([place])   . Chronic systolic heart failure (HCC)     LVEF 40-45%  . Emphysema   . Coronary atherosclerosis of native coronary artery     Multivessel status post CABG  . Gastric erosions 12/2005    Per EGD - Dr. [name]  . Colon polyps 12/2005    Per colonoscopy  . Ischemic cardiomyopathy   . Aortic stenosis     Moderate  . Mitral regurgitation     Moderate  . Pulmonary nodules   . Hypothyroidism   . Psoriasis   . Myocardial infarction [facility])     Remote  . CKD (chronic kidney disease) stage 3, GFR 30-59 ml/min   . Anemia   . Colon cancer ([place])   . Anxiety   . GERD (gastroesophageal reflux disease)   . COPD (chronic obstructive pulmonary disease) [facility])    Past Surgical History  Procedure Laterality Date  . Coronary artery bypass graft  March 2004    LIMA to LAD, SVG to diagonal, SVG to OM 2, SVG to RCA  . Flexible sigmoidoscopy  10/25/2012    Procedure: FLEXIBLE SIGMOIDOSCOPY;  Surgeon: [name], MD;  Location: [facility];  Service: Gastroenterology;  Laterality:  N/A;  . Partial colectomy  10/26/2012    Procedure: PARTIAL COLECTOMY;  Surgeon: [name], MD;  Location: [facility] ORS;  Service: General;  Laterality: N/A;  Closure of Colovesical Fistula  . Colectomy N/A 12/13/2013    Procedure: TOTAL COLECTOMY;  Surgeon: [name], MD;  Location: [facility] ORS;  Service: General;  Laterality: N/A;   Family History  Problem Relation Age of Onset  . Cancer Brother   . Hypertension Other   . Heart Problems Mother     unknown   Social History  Substance Use Topics  . Smoking status: Former Smoker -- 1.00 packs/day for 30 years    Types: Cigarettes    Quit date: 03/17/2003  . Smokeless tobacco: Never Used  . Alcohol Use: No     Comment: no alcohol use since '04    Review of Systems  Respiratory: Positive for shortness of breath.   All other systems reviewed and are negative.     Allergies  Review of patient's allergies indicates no known allergies.  Home Medications   Prior to Admission medications   Medication Sig Start Date End Date Taking? Authorizing Provider  albuterol (PROVENTIL) (2.5 MG/3ML) 0.083% nebulizer solution Inhale 3 mLs into the lungs every 6 (six) hours as needed. Shortness of breath/wheezing 08/06/15   Historical Provider, MD  ALPRAZolam Duanne Moron) 1 MG tablet Take 1 mg by mouth  daily as needed for anxiety.  10/28/12   Historical Provider, MD  carvedilol (COREG) 6.25 MG tablet Take 1 tablet (6.25 mg total) by mouth 2 (two) times daily with a meal. 08/11/15   Brett Canales, PA-C  diltiazem (CARDIZEM CD) 120 MG 24 hr capsule Take 1 capsule (120 mg total) by mouth 2 (two) times daily. 09/25/15   [name], MD  furosemide (LASIX) 20 MG tablet Take 1 tablet (20 mg total) by mouth daily. 08/11/15   Brett Canales, PA-C  HYDROcodone-acetaminophen (NORCO) 5-325 MG per tablet Take 1-2 tablets by mouth every 4 (four) hours as needed. Patient taking differently: Take 1 tablet by mouth every 6 (six) hours as needed for moderate pain.   12/18/13   Aviva Signs, MD  Ipratropium-Albuterol (COMBIVENT RESPIMAT) 20-100 MCG/ACT AERS respimat Inhale 1 puff into the lungs as needed. Patient taking differently: Inhale 1 puff into the lungs as needed for wheezing or shortness of breath.  06/15/13   [name], MD  loperamide (IMODIUM) 2 MG capsule Take 1 capsule (2 mg total) by mouth as needed for diarrhea or loose stools. 10/29/12   Chelsea Primus, MD  potassium chloride SA (K-DUR,KLOR-CON) 20 MEQ tablet Take 20 mEq by mouth daily.  02/08/12   Rexene Alberts, MD  warfarin (COUMADIN) 5 MG tablet Take 1 tablet daily except 1/2 tablet on Mondays and Thursdays or As Directed 10/28/15   [name], MD   BP 159/92 mmHg  Pulse 82  Temp(Src) 98 F (36.7 C) (Oral)  Resp 20  Ht 5\' 10"  (1.778 m)  Wt 178 lb (80.74 kg)  BMI 25.54 kg/m2  SpO2 95% Physical Exam  Nursing note and vitals reviewed.  75 year old male, resting comfortably and in no acute distress. Vital signs are significant for hypertension. Oxygen saturation is 95%, which is normal. Head is normocephalic and atraumatic. PERRLA, EOMI. Oropharynx is clear. Neck is nontender and supple without adenopathy or JVD. Back is nontender and there is no CVA tenderness. Lungs have faint rales at the right base, and diffuse high-pitched wheezes. Chest is nontender. Heart has regular rate and rhythm without murmur. Abdomen is soft, flat, nontender without masses or hepatosplenomegaly and peristalsis is normoactive. Extremities have 2+ pretibial edema and 2+ presacral edema, full range of motion is present. Skin is warm and dry without rash. Neurologic: Mental status is normal, cranial nerves are intact, there are no motor or sensory deficits.  ED Course  Procedures (including critical care time) Labs Review Results for orders placed or performed during the hospital encounter of 0000000  Basic metabolic panel  Result Value Ref Range   Sodium 139 135 - 145 mmol/L   Potassium 4.3  3.5 - 5.1 mmol/L   Chloride 103 101 - 111 mmol/L   CO2 27 22 - 32 mmol/L   Glucose, Bld 118 (H) 65 - 99 mg/dL   BUN 16 6 - 20 mg/dL   Creatinine, Ser 1.20 0.61 - 1.24 mg/dL   Calcium 9.3 8.9 - 10.3 mg/dL   GFR calc non Af Amer 57 (L) >60 mL/min   GFR calc Af Amer >60 >60 mL/min   Anion gap 9 5 - 15  Troponin I  Result Value Ref Range   Troponin I <0.03 <0.031 ng/mL  Brain natriuretic peptide  Result Value Ref Range   B Natriuretic Peptide 720.0 (H) 0.0 - 100.0 pg/mL  CBC with Differential  Result Value Ref Range   WBC 11.7 (H) 4.0 -  10.5 K/uL   RBC 4.77 4.22 - 5.81 MIL/uL   Hemoglobin 14.9 13.0 - 17.0 g/dL   HCT 46.1 39.0 - 52.0 %   MCV 96.6 78.0 - 100.0 fL   MCH 31.2 26.0 - 34.0 pg   MCHC 32.3 30.0 - 36.0 g/dL   RDW 14.9 11.5 - 15.5 %   Platelets 66 (L) 150 - 400 K/uL   Neutrophils Relative % 87 %   Neutro Abs 10.2 (H) 1.7 - 7.7 K/uL   Lymphocytes Relative 6 %   Lymphs Abs 0.7 0.7 - 4.0 K/uL   Monocytes Relative 7 %   Monocytes Absolute 0.8 0.1 - 1.0 K/uL   Eosinophils Relative 0 %   Eosinophils Absolute 0.0 0.0 - 0.7 K/uL   Basophils Relative 0 %   Basophils Absolute 0.0 0.0 - 0.1 K/uL   Imaging Review Dg Chest 2 View  10/29/2015  CLINICAL DATA:  Two day history of shortness of breath. Cough for 8 days. Atrial fibrillation. EXAM: CHEST  2 VIEW COMPARISON:  August 08, 2015 FINDINGS: Lungs remain somewhat hyperexpanded. There is chronic central interstitial thickening. There is mild interstitial edema, apparently superimposed on a degree of chronic interstitial thickening. No airspace consolidation. There is borderline cardiomegaly with mild pulmonary venous hypertension. No adenopathy. Patient is status post coronary artery bypass grafting. There is an azygos lobe on the right, an anatomic variant. There is degenerative change in the thoracic spine. IMPRESSION: Evidence of a degree of congestive heart failure superimposed on what appears to underlying chronic bronchitis. No  airspace consolidation. Electronically Signed   By: Lowella Grip III M.D.   On: 10/29/2015 14:15   I have personally reviewed and evaluated these images and lab results as part of my medical decision-making.   EKG Interpretation None      MDM   Final diagnoses:  Acute on chronic systolic congestive heart failure (HCC)  COPD exacerbation (HCC)  Thrombocytopenia (HCC)    Dyspnea which most likely is accommodation of CHF and COPD. Old records are reviewed confirming long-standing atrial fibrillation for which he is on warfarin, as well as COPD and ischemic cardiomyopathy. Last ejection fraction recorded was 35-40%. INR yesterday was therapeutic and will not be repeated today. Chest x-ray appears to show worsening CHF. Screening labs are obtained and is given a dose of furosemide and is given an albuterol with ipratropium nebulizer treatment.  BNP has come back elevated. There is mild leukocytosis present, but he does not appear to be clinically infected. Chronic thrombocytopenia is present unchanged from baseline. He feels much better after diuresis and treatment with nebulizer. He is advised to continue using his nebulizer at home and is advised to double his furosemide dose for the next 3 days. He is also given a prescription for prednisone take for the next 5 days. Follow-up with PCP.  Delora Fuel, MD [phone] [id number]

## 2015-10-29 NOTE — Discharge Instructions (Signed)
Double your furosemide (Lasix) to two tablets (40 mg) a day for the next three days.  Continue to use your nebulizer as needed.  Heart Failure Heart failure is a condition in which the heart has trouble pumping blood. This means your heart does not pump blood efficiently for your body to work well. In some cases of heart failure, fluid may back up into your lungs or you may have swelling (edema) in your lower legs. Heart failure is usually a long-term (chronic) condition. It is important for you to take good care of yourself and follow your health care provider's treatment plan. CAUSES  Some health conditions can cause heart failure. Those health conditions include:  High blood pressure (hypertension). Hypertension causes the heart muscle to work harder than normal. When pressure in the blood vessels is high, the heart needs to pump (contract) with more force in order to circulate blood throughout the body. High blood pressure eventually causes the heart to become stiff and weak.  Coronary artery disease (CAD). CAD is the buildup of cholesterol and fat (plaque) in the arteries of the heart. The blockage in the arteries deprives the heart muscle of oxygen and blood. This can cause chest pain and may lead to a heart attack. High blood pressure can also contribute to CAD.  Heart attack (myocardial infarction). A heart attack occurs when one or more arteries in the heart become blocked. The loss of oxygen damages the muscle tissue of the heart. When this happens, part of the heart muscle dies. The injured tissue does not contract as well and weakens the heart's ability to pump blood.  Abnormal heart valves. When the heart valves do not open and close properly, it can cause heart failure. This makes the heart muscle pump harder to keep the blood flowing.  Heart muscle disease (cardiomyopathy or myocarditis). Heart muscle disease is damage to the heart muscle from a variety of causes. These can include  drug or alcohol abuse, infections, or unknown reasons. These can increase the risk of heart failure.  Lung disease. Lung disease makes the heart work harder because the lungs do not work properly. This can cause a strain on the heart, leading it to fail.  Diabetes. Diabetes increases the risk of heart failure. High blood sugar contributes to high fat (lipid) levels in the blood. Diabetes can also cause slow damage to tiny blood vessels that carry important nutrients to the heart muscle. When the heart does not get enough oxygen and food, it can cause the heart to become weak and stiff. This leads to a heart that does not contract efficiently.  Other conditions can contribute to heart failure. These include abnormal heart rhythms, thyroid problems, and low blood counts (anemia). Certain unhealthy behaviors can increase the risk of heart failure, including:  Being overweight.  Smoking or chewing tobacco.  Eating foods high in fat and cholesterol.  Abusing illicit drugs or alcohol.  Lacking physical activity. SYMPTOMS  Heart failure symptoms may vary and can be hard to detect. Symptoms may include:  Shortness of breath with activity, such as climbing stairs.  Persistent cough.  Swelling of the feet, ankles, legs, or abdomen.  Unexplained weight gain.  Difficulty breathing when lying flat (orthopnea).  Waking from sleep because of the need to sit up and get more air.  Rapid heartbeat.  Fatigue and loss of energy.  Feeling light-headed, dizzy, or close to fainting.  Loss of appetite.  Nausea.  Increased urination during the night (nocturia). DIAGNOSIS  A diagnosis of heart failure is based on your history, symptoms, physical examination, and diagnostic tests. Diagnostic tests for heart failure may include:  Echocardiography.  Electrocardiography.  Chest X-ray.  Blood tests.  Exercise stress test.  Cardiac angiography.  Radionuclide scans. TREATMENT  Treatment  is aimed at managing the symptoms of heart failure. Medicines, behavioral changes, or surgical intervention may be necessary to treat heart failure.  Medicines to help treat heart failure may include:  Angiotensin-converting enzyme (ACE) inhibitors. This type of medicine blocks the effects of a blood protein called angiotensin-converting enzyme. ACE inhibitors relax (dilate) the blood vessels and help lower blood pressure.  Angiotensin receptor blockers (ARBs). This type of medicine blocks the actions of a blood protein called angiotensin. Angiotensin receptor blockers dilate the blood vessels and help lower blood pressure.  Water pills (diuretics). Diuretics cause the kidneys to remove salt and water from the blood. The extra fluid is removed through urination. This loss of extra fluid lowers the volume of blood the heart pumps.  Beta blockers. These prevent the heart from beating too fast and improve heart muscle strength.  Digitalis. This increases the force of the heartbeat.  Healthy behavior changes include:  Obtaining and maintaining a healthy weight.  Stopping smoking or chewing tobacco.  Eating heart-healthy foods.  Limiting or avoiding alcohol.  Stopping illicit drug use.  Physical activity as directed by your health care provider.  Surgical treatment for heart failure may include:  A procedure to open blocked arteries, repair damaged heart valves, or remove damaged heart muscle tissue.  A pacemaker to improve heart muscle function and control certain abnormal heart rhythms.  An internal cardioverter defibrillator to treat certain serious abnormal heart rhythms.  A left ventricular assist device (LVAD) to assist the pumping ability of the heart. HOME CARE INSTRUCTIONS  1. Take medicines only as directed by your health care provider. Medicines are important in reducing the workload of your heart, slowing the progression of heart failure, and improving your  symptoms. 1. Do not stop taking your medicine unless directed by your health care provider. 2. Do not skip any dose of medicine. 3. Refill your prescriptions before you run out of medicine. Your medicines are needed every day. 2. Engage in moderate physical activity if directed by your health care provider. Moderate physical activity can benefit some people. The elderly and people with severe heart failure should consult with a health care provider for physical activity recommendations. 3. Eat heart-healthy foods. Food choices should be free of trans fat and low in saturated fat, cholesterol, and salt (sodium). Healthy choices include fresh or frozen fruits and vegetables, fish, lean meats, legumes, fat-free or low-fat dairy products, and whole grain or high fiber foods. Talk to a dietitian to learn more about heart-healthy foods. 4. Limit sodium if directed by your health care provider. Sodium restriction may reduce symptoms of heart failure in some people. Talk to a dietitian to learn more about heart-healthy seasonings. 5. Use healthy cooking methods. Healthy cooking methods include roasting, grilling, broiling, baking, poaching, steaming, or stir-frying. Talk to a dietitian to learn more about healthy cooking methods. 6. Limit fluids if directed by your health care provider. Fluid restriction may reduce symptoms of heart failure in some people. 7. Weigh yourself every day. Daily weights are important in the early recognition of excess fluid. You should weigh yourself every morning after you urinate and before you eat breakfast. Wear the same amount of clothing each time you weigh yourself. Record your  daily weight. Provide your health care provider with your weight record. 8. Monitor and record your blood pressure if directed by your health care provider. 9. Check your pulse if directed by your health care provider. 10. Lose weight if directed by your health care provider. Weight loss may reduce  symptoms of heart failure in some people. 11. Stop smoking or chewing tobacco. Nicotine makes your heart work harder by causing your blood vessels to constrict. Do not use nicotine gum or patches before talking to your health care provider. 12. Keep all follow-up visits as directed by your health care provider. This is important. 13. Limit alcohol intake to no more than 1 drink per day for nonpregnant women and 2 drinks per day for men. One drink equals 12 ounces of beer, 5 ounces of wine, or 1 ounces of hard liquor. Drinking more than that is harmful to your heart. Tell your health care provider if you drink alcohol several times a week. Talk with your health care provider about whether alcohol is safe for you. If your heart has already been damaged by alcohol or you have severe heart failure, drinking alcohol should be stopped completely. 14. Stop illicit drug use. 15. Stay up-to-date with immunizations. It is especially important to prevent respiratory infections through current pneumococcal and influenza immunizations. 16. Manage other health conditions such as hypertension, diabetes, thyroid disease, or abnormal heart rhythms as directed by your health care provider. 17. Learn to manage stress. 18. Plan rest periods when fatigued. 19. Learn strategies to manage high temperatures. If the weather is extremely hot: 1. Avoid vigorous physical activity. 2. Use air conditioning or fans or seek a cooler location. 3. Avoid caffeine and alcohol. 4. Wear loose-fitting, lightweight, and light-colored clothing. 20. Learn strategies to manage cold temperatures. If the weather is extremely cold: 1. Avoid vigorous physical activity. 2. Layer clothes. 3. Wear mittens or gloves, a hat, and a scarf when going outside. 4. Avoid alcohol. 21. Obtain ongoing education and support as needed. 9. Participate in or seek rehabilitation as needed to maintain or improve independence and quality of life. SEEK MEDICAL  CARE IF:   You have a rapid weight gain.  You have increasing shortness of breath that is unusual for you.  You are unable to participate in your usual physical activities.  You tire easily.  You cough more than normal, especially with physical activity.  You have any or more swelling in areas such as your hands, feet, ankles, or abdomen.  You are unable to sleep because it is hard to breathe.  You feel like your heart is beating fast (palpitations).  You become dizzy or light-headed upon standing up. SEEK IMMEDIATE MEDICAL CARE IF:   You have difficulty breathing.  There is a change in mental status such as decreased alertness or difficulty with concentration.  You have a pain or discomfort in your chest.  You have an episode of fainting (syncope). MAKE SURE YOU:   Understand these instructions.  Will watch your condition.  Will get help right away if you are not doing well or get worse.   This information is not intended to replace advice given to you by your health care provider. Make sure you discuss any questions you have with your health care provider.   Document Released: 09/28/2005 Document Revised: 02/12/2015 Document Reviewed: 10/28/2012 Elsevier Interactive Patient Education 2016 [employer]  Chronic Obstructive Pulmonary Disease Chronic obstructive pulmonary disease (COPD) is a common lung condition in which airflow from  the lungs is limited. COPD is a general term that can be used to describe many different lung problems that limit airflow, including both chronic bronchitis and emphysema. If you have COPD, your lung function will probably never return to normal, but there are measures you can take to improve lung function and make yourself feel better. CAUSES   Smoking (common).  Exposure to secondhand smoke.  Genetic problems.  Chronic inflammatory lung diseases or recurrent infections. SYMPTOMS  Shortness of breath, especially with physical  activity.  Deep, persistent (chronic) cough with a large amount of thick mucus.  Wheezing.  Rapid breaths (tachypnea).  Gray or bluish discoloration (cyanosis) of the skin, especially in your fingers, toes, or lips.  Fatigue.  Weight loss.  Frequent infections or episodes when breathing symptoms become much worse (exacerbations).  Chest tightness. DIAGNOSIS Your health care provider will take a medical history and perform a physical examination to diagnose COPD. Additional tests for COPD may include:  Lung (pulmonary) function tests.  Chest X-ray.  CT scan.  Blood tests. TREATMENT  Treatment for COPD may include:  Inhaler and nebulizer medicines. These help manage the symptoms of COPD and make your breathing more comfortable.  Supplemental oxygen. Supplemental oxygen is only helpful if you have a low oxygen level in your blood.  Exercise and physical activity. These are beneficial for nearly all people with COPD.  Lung surgery or transplant.  Nutrition therapy to gain weight, if you are underweight.  Pulmonary rehabilitation. This may involve working with a team of health care providers and specialists, such as respiratory, occupational, and physical therapists. HOME CARE INSTRUCTIONS  Take all medicines (inhaled or pills) as directed by your health care provider.  Avoid over-the-counter medicines or cough syrups that dry up your airway (such as antihistamines) and slow down the elimination of secretions unless instructed otherwise by your health care provider.  If you are a smoker, the most important thing that you can do is stop smoking. Continuing to smoke will cause further lung damage and breathing trouble. Ask your health care provider for help with quitting smoking. He or she can direct you to community resources or hospitals that provide support.  Avoid exposure to irritants such as smoke, chemicals, and fumes that aggravate your breathing.  Use oxygen  therapy and pulmonary rehabilitation if directed by your health care provider. If you require home oxygen therapy, ask your health care provider whether you should purchase a pulse oximeter to measure your oxygen level at home.  Avoid contact with individuals who have a contagious illness.  Avoid extreme temperature and humidity changes.  Eat healthy foods. Eating smaller, more frequent meals and resting before meals may help you maintain your strength.  Stay active, but balance activity with periods of rest. Exercise and physical activity will help you maintain your ability to do things you want to do.  Preventing infection and hospitalization is very important when you have COPD. Make sure to receive all the vaccines your health care provider recommends, especially the pneumococcal and influenza vaccines. Ask your health care provider whether you need a pneumonia vaccine.  Learn and use relaxation techniques to manage stress.  Learn and use controlled breathing techniques as directed by your health care provider. Controlled breathing techniques include:  Pursed lip breathing. Start by breathing in (inhaling) through your nose for 1 second. Then, purse your lips as if you were going to whistle and breathe out (exhale) through the pursed lips for 2 seconds.  Diaphragmatic breathing.  Start by putting one hand on your abdomen just above your waist. Inhale slowly through your nose. The hand on your abdomen should move out. Then purse your lips and exhale slowly. You should be able to feel the hand on your abdomen moving in as you exhale.  Learn and use controlled coughing to clear mucus from your lungs. Controlled coughing is a series of short, progressive coughs. The steps of controlled coughing are: 23. Lean your head slightly forward. 24. Breathe in deeply using diaphragmatic breathing. 25. Try to hold your breath for 3 seconds. 26. Keep your mouth slightly open while coughing  twice. 27. Spit any mucus out into a tissue. 28. Rest and repeat the steps once or twice as needed. SEEK MEDICAL CARE IF:  You are coughing up more mucus than usual.  There is a change in the color or thickness of your mucus.  Your breathing is more labored than usual.  Your breathing is faster than usual. SEEK IMMEDIATE MEDICAL CARE IF:  You have shortness of breath while you are resting.  You have shortness of breath that prevents you from:  Being able to talk.  Performing your usual physical activities.  You have chest pain lasting longer than 5 minutes.  Your skin color is more cyanotic than usual.  You measure low oxygen saturations for longer than 5 minutes with a pulse oximeter. MAKE SURE YOU:  Understand these instructions.  Will watch your condition.  Will get help right away if you are not doing well or get worse.   This information is not intended to replace advice given to you by your health care provider. Make sure you discuss any questions you have with your health care provider.   Document Released: 07/08/2005 Document Revised: 10/19/2014 Document Reviewed: 05/25/2013 Elsevier Interactive Patient Education 2016 [employer]  Prednisone tablets What is this medicine? PREDNISONE (PRED ni sone) is a corticosteroid. It is commonly used to treat inflammation of the skin, joints, lungs, and other organs. Common conditions treated include asthma, allergies, and arthritis. It is also used for other conditions, such as blood disorders and diseases of the adrenal glands. This medicine may be used for other purposes; ask your health care provider or pharmacist if you have questions. What should I tell my health care provider before I take this medicine? They need to know if you have any of these conditions: -Cushing's syndrome -diabetes -glaucoma -heart disease -high blood pressure -infection (especially a virus infection such as chickenpox, cold sores, or  herpes) -kidney disease -liver disease -mental illness -myasthenia gravis -osteoporosis -seizures -stomach or intestine problems -thyroid disease -an unusual or allergic reaction to lactose, prednisone, other medicines, foods, dyes, or preservatives -pregnant or trying to get pregnant -breast-feeding How should I use this medicine? Take this medicine by mouth with a glass of water. Follow the directions on the prescription label. Take this medicine with food. If you are taking this medicine once a day, take it in the morning. Do not take more medicine than you are told to take. Do not suddenly stop taking your medicine because you may develop a severe reaction. Your doctor will tell you how much medicine to take. If your doctor wants you to stop the medicine, the dose may be slowly lowered over time to avoid any side effects. Talk to your pediatrician regarding the use of this medicine in children. Special care may be needed. Overdosage: If you think you have taken too much of this medicine contact a poison  control center or emergency room at once. NOTE: This medicine is only for you. Do not share this medicine with others. What if I miss a dose? If you miss a dose, take it as soon as you can. If it is almost time for your next dose, talk to your doctor or health care professional. You may need to miss a dose or take an extra dose. Do not take double or extra doses without advice. What may interact with this medicine? Do not take this medicine with any of the following medications: -metyrapone -mifepristone This medicine may also interact with the following medications: -aminoglutethimide -amphotericin B -aspirin and aspirin-like medicines -barbiturates -certain medicines for diabetes, like glipizide or glyburide -cholestyramine -cholinesterase inhibitors -cyclosporine -digoxin -diuretics -ephedrine -male hormones, like estrogens and birth control  pills -isoniazid -ketoconazole -NSAIDS, medicines for pain and inflammation, like ibuprofen or naproxen -phenytoin -rifampin -toxoids -vaccines -warfarin This list may not describe all possible interactions. Give your health care provider a list of all the medicines, herbs, non-prescription drugs, or dietary supplements you use. Also tell them if you smoke, drink alcohol, or use illegal drugs. Some items may interact with your medicine. What should I watch for while using this medicine? Visit your doctor or health care professional for regular checks on your progress. If you are taking this medicine over a prolonged period, carry an identification card with your name and address, the type and dose of your medicine, and your doctor's name and address. This medicine may increase your risk of getting an infection. Tell your doctor or health care professional if you are around anyone with measles or chickenpox, or if you develop sores or blisters that do not heal properly. If you are going to have surgery, tell your doctor or health care professional that you have taken this medicine within the last twelve months. Ask your doctor or health care professional about your diet. You may need to lower the amount of salt you eat. This medicine may affect blood sugar levels. If you have diabetes, check with your doctor or health care professional before you change your diet or the dose of your diabetic medicine. What side effects may I notice from receiving this medicine? Side effects that you should report to your doctor or health care professional as soon as possible: -allergic reactions like skin rash, itching or hives, swelling of the face, lips, or tongue -changes in emotions or moods -changes in vision -depressed mood -eye pain -fever or chills, cough, sore throat, pain or difficulty passing urine -increased thirst -swelling of ankles, feet Side effects that usually do not require medical  attention (report to your doctor or health care professional if they continue or are bothersome): -confusion, excitement, restlessness -headache -nausea, vomiting -skin problems, acne, thin and shiny skin -trouble sleeping -weight gain This list may not describe all possible side effects. Call your doctor for medical advice about side effects. You may report side effects to FDA at 1-800-FDA-1088. Where should I keep my medicine? Keep out of the reach of children. Store at room temperature between 15 and 30 degrees C (59 and 86 degrees F). Protect from light. Keep container tightly closed. Throw away any unused medicine after the expiration date. NOTE: This sheet is a summary. It may not cover all possible information. If you have questions about this medicine, talk to your doctor, pharmacist, or health care provider.    2016, [employer]. (2011-05-14 10:57:14)

## 2015-10-29 NOTE — ED Notes (Signed)
Cough and sob times 2 days

## 2015-10-29 NOTE — ED Notes (Signed)
Nizaiah Schoepp (son) contact:  [phone]

## 2015-11-27 ENCOUNTER — Ambulatory Visit (INDEPENDENT_AMBULATORY_CARE_PROVIDER_SITE_OTHER): Payer: Medicare Other | Admitting: *Deleted

## 2015-11-27 DIAGNOSIS — Z5181 Encounter for therapeutic drug level monitoring: Secondary | ICD-10-CM | POA: Diagnosis not present

## 2015-11-27 DIAGNOSIS — I480 Paroxysmal atrial fibrillation: Secondary | ICD-10-CM

## 2015-11-27 LAB — POCT INR: INR: 2.3

## 2015-12-25 ENCOUNTER — Ambulatory Visit (INDEPENDENT_AMBULATORY_CARE_PROVIDER_SITE_OTHER): Payer: Medicare Other | Admitting: *Deleted

## 2015-12-25 DIAGNOSIS — I480 Paroxysmal atrial fibrillation: Secondary | ICD-10-CM

## 2015-12-25 DIAGNOSIS — Z5181 Encounter for therapeutic drug level monitoring: Secondary | ICD-10-CM | POA: Diagnosis not present

## 2015-12-25 LAB — POCT INR: INR: 2.4

## 2016-01-21 ENCOUNTER — Other Ambulatory Visit: Payer: Self-pay | Admitting: Cardiology

## 2016-02-05 ENCOUNTER — Ambulatory Visit (INDEPENDENT_AMBULATORY_CARE_PROVIDER_SITE_OTHER): Payer: Medicare Other | Admitting: *Deleted

## 2016-02-05 DIAGNOSIS — I480 Paroxysmal atrial fibrillation: Secondary | ICD-10-CM

## 2016-02-05 DIAGNOSIS — Z5181 Encounter for therapeutic drug level monitoring: Secondary | ICD-10-CM

## 2016-02-05 LAB — POCT INR: INR: 2.2

## 2016-02-20 ENCOUNTER — Ambulatory Visit (INDEPENDENT_AMBULATORY_CARE_PROVIDER_SITE_OTHER): Payer: Medicare Other | Admitting: Cardiology

## 2016-02-20 ENCOUNTER — Encounter: Payer: Self-pay | Admitting: Cardiology

## 2016-02-20 VITALS — BP 112/74 | HR 92 | Ht 70.0 in | Wt 176.0 lb

## 2016-02-20 DIAGNOSIS — I482 Chronic atrial fibrillation, unspecified: Secondary | ICD-10-CM

## 2016-02-20 DIAGNOSIS — I429 Cardiomyopathy, unspecified: Secondary | ICD-10-CM

## 2016-02-20 DIAGNOSIS — I5042 Chronic combined systolic (congestive) and diastolic (congestive) heart failure: Secondary | ICD-10-CM

## 2016-02-20 DIAGNOSIS — I38 Endocarditis, valve unspecified: Secondary | ICD-10-CM

## 2016-02-20 DIAGNOSIS — I2510 Atherosclerotic heart disease of native coronary artery without angina pectoris: Secondary | ICD-10-CM

## 2016-02-20 DIAGNOSIS — I428 Other cardiomyopathies: Secondary | ICD-10-CM

## 2016-02-20 NOTE — Patient Instructions (Signed)
Your physician recommends that you schedule a follow-up appointment in: 3 months with Dr Domenic Polite   INCREASE your Lasix to 40 mg daily for the next 3 days, then go back to 20 mg daily dose       Thank you for choosing [facility] !

## 2016-02-20 NOTE — Progress Notes (Signed)
Cardiology Office Note  Date: 02/20/2016   ID: Cory Ryan, Cory Ryan 25-Jun-1941, MRN [id number]  PCP: Collene Mares, PA-C  Primary Cardiologist: Rozann Lesches, MD   Chief Complaint  Patient presents with  . Coronary Artery Disease  . Cardiomyopathy  . Atrial Fibrillation    History of Present Illness: Cory Ryan is a medically complex 75 y.o. male last seen in January 2017. He is here today with his son for a follow-up visit. Reports no chest pain or palpitations. He has had recurrent leg edema, was temporarily on increased dose of Lasix for a few days about one month ago that was beneficial. His weight is stable.  He continues on Coumadin with follow-up in the anticoagulation clinic. No reported spontaneous bleeding problems.  Follow-up echocardiogram from January showed LVEF in the 40-45% range, mild to moderate aortic stenosis, moderate aortic regurgitation, and moderate mitral regurgitation.  Past Medical History  Diagnosis Date  . Atrial fibrillation ([place])   . Chronic systolic heart failure (HCC)     LVEF 40-45%  . Emphysema   . Coronary atherosclerosis of native coronary artery     Multivessel status post CABG  . Gastric erosions 12/2005    Per EGD - Dr. [name]  . Colon polyps 12/2005    Per colonoscopy  . Ischemic cardiomyopathy   . Aortic stenosis     Moderate  . Mitral regurgitation     Moderate  . Pulmonary nodules   . Hypothyroidism   . Psoriasis   . Myocardial infarction [facility])     Remote  . CKD (chronic kidney disease) stage 3, GFR 30-59 ml/min   . Anemia   . Colon cancer ([place])   . Anxiety   . GERD (gastroesophageal reflux disease)   . COPD (chronic obstructive pulmonary disease) [facility])     Past Surgical History  Procedure Laterality Date  . Coronary artery bypass graft  March 2004    LIMA to LAD, SVG to diagonal, SVG to OM 2, SVG to RCA  . Flexible sigmoidoscopy  10/25/2012    Procedure: FLEXIBLE SIGMOIDOSCOPY;  Surgeon: [name], MD;   Location: [facility] END[facility] SU[facility];  Service: Gastroenterology;  Laterality: N/A;  . Partial colectomy  10/26/2012    Procedure: PARTIAL COLECTOMY;  Surgeon: [name], MD;  Location: [facility] ORS;  Service: General;  Laterality: N/A;  Closure of Colovesical Fistula  . Colectomy N/A 12/13/2013    Procedure: TOTAL COLECTOMY;  Surgeon: [name], MD;  Location: [facility] ORS;  Service: General;  Laterality: N/A;    Current Outpatient Prescriptions  Medication Sig Dispense Refill  . albuterol (PROVENTIL) (2.5 MG/3ML) 0.083% nebulizer solution Inhale 3 mLs into the lungs every 4 (four) hours as needed for wheezing or shortness of breath.     [name] atorvastatin (LIPITOR) 40 MG tablet Take 40 mg by mouth every evening.     . carvedilol (COREG) 6.25 MG tablet Take 1 tablet (6.25 mg total) by mouth 2 (two) times daily with a meal. 60 tablet 11  . diltiazem (CARDIZEM CD) 120 MG 24 hr capsule TAKE (1) CAPSULE BY MOUTH TWICE DAILY. 60 capsule 6  . furosemide (LASIX) 20 MG tablet Take 1 tablet (20 mg total) by mouth daily. 30 tablet 11  . HYDROcodone-acetaminophen (NORCO) 5-325 MG per tablet Take 1-2 tablets by mouth every 4 (four) hours as needed. (Patient taking differently: Take 1 tablet by mouth every 6 (six) hours as needed for moderate pain. ) 40 tablet 0  .  Ipratropium-Albuterol (COMBIVENT RESPIMAT) 20-100 MCG/ACT AERS respimat Inhale 1 puff into the lungs as needed. (Patient taking differently: Inhale 1 puff into the lungs as needed for wheezing or shortness of breath. ) 1 Inhaler 1  . loperamide (IMODIUM) 2 MG capsule Take 1 capsule (2 mg total) by mouth as needed for diarrhea or loose stools. (Patient taking differently: Take 2-4 mg by mouth as needed for diarrhea or loose stools. ) 20 capsule 2  . potassium chloride SA (K-DUR,KLOR-CON) 20 MEQ tablet Take 20 mEq by mouth 2 (two) times daily.     [name] warfarin (COUMADIN) 5 MG tablet Take 1 tablet daily except 1/2 tablet on Mondays and Thursdays or As Directed (Patient taking  differently: Take 2.5-5 mg by mouth See admin instructions. Take 1 tablet daily except 1/2 tablet on Mondays and Thursdays or As Directed) 30 tablet 6   No current facility-administered medications for this visit.   Allergies:  Review of patient's allergies indicates no known allergies.   Social History: The patient  reports that he quit smoking about 12 years ago. His smoking use included Cigarettes. He has a 30 pack-year smoking history. He has never used smokeless tobacco. He reports that he does not drink alcohol or use illicit drugs.   ROS:  Please see the history of present illness. Otherwise, complete review of systems is positive for decreased hearing, limited ambulation.  All other systems are reviewed and negative.   Physical Exam: VS:  BP 112/74 mmHg  Pulse 92  Ht 5\' 10"  (1.778 m)  Wt 176 lb (79.833 kg)  BMI 25.25 kg/m2  SpO2 94%, BMI Body mass index is 25.25 kg/(m^2).  Wt Readings from Last 3 Encounters:  02/20/16 176 lb (79.833 kg)  10/29/15 178 lb (80.74 kg)  10/16/15 179 lb (81.194 kg)    General: Elderly male, appears comfortable at rest. HEENT: Conjunctiva and lids normal, oropharynx clear. Neck: Supple, no elevated JVP or carotid bruits, no thyromegaly. Lungs: Decreased breath sounds with prolonged expiratory phase and rhonchi, nonlabored breathing at rest. Cardiac: Irregularly irregular, no S3, 2/6 systolic murmur, no pericardial rub. Abdomen: Soft, nontender, bowel sounds present, no guarding or rebound. Extremities: Bilateral venous stasis, 1-2+ edema, distal pulses 2+.  ECG: I personally reviewed the prior tracing from 10/29/2015 which showed atrial fibrillation with IVCD and repolarization abnormalities.  Recent Labwork: 08/08/2015: TSH 1.317 10/29/2015: B Natriuretic Peptide 720.0*; BUN 16; Creatinine, Ser 1.20; Hemoglobin 14.9; Platelets 66*; Potassium 4.3; Sodium 139     Component Value Date/Time   CHOL 145 08/09/2015 0438   TRIG 75 08/09/2015 0438    HDL 27* 08/09/2015 0438   CHOLHDL 5.4 08/09/2015 0438   VLDL 15 08/09/2015 0438   LDLCALC 103* 08/09/2015 0438   Other Studies Reviewed Today:  Echocardiogram 10/23/2015: Study Conclusions  - Left ventricle: The cavity size was normal. Wall thickness was  increased in a pattern of mild LVH. Systolic function was mildly  to moderately reduced. The estimated ejection fraction was in the  range of 40% to 45%. The study was not technically sufficient to  allow evaluation of LV diastolic dysfunction due to atrial  fibrillation. - Regional wall motion abnormality: Akinesis of the basal  inferoseptal, basal-mid inferior, and mid inferolateral  myocardium; severe hypokinesis of the mid inferoseptal  myocardium; moderate hypokinesis of the mid anteroseptal and  basal anterolateral myocardium; mild hypokinesis of the mid  anterior and mid anterolateral myocardium. - Aortic valve: Morphologically, there was at least mild aortic  stenosis. Moderately calcified  annulus. Mildly thickened,  moderately calcified leaflets. There was trivial regurgitation. - Mitral valve: Calcified annulus. Normal thickness leaflets .  There was moderate regurgitation. - Left atrium: The atrium was moderately to severely dilated. - Right ventricle: Systolic function was moderately to severely  reduced. - Right atrium: The atrium was mildly to moderately dilated. - Atrial septum: No defect or patent foramen ovale was identified. - Tricuspid valve: There was moderate regurgitation. - Pulmonic valve: There was mild regurgitation. - Pulmonary arteries: Systolic pressure was severely increased. PA  peak pressure: 63 mm Hg (S). - Inferior vena cava: Estimated CVP 15 mmHg.  Assessment and Plan:  1. Cardiomyopathy with chronic combined heart failure. Weight is stable but he has had some recurrent leg edema. I recommended that he increase his Lasix to 40 mg daily for the next 3 days, then back to 20 mg  daily. If he has to use increased dose Lasix with regularity, we may just increase his standing dose going forward. He will call us back and let us know.  2. Chronic atrial fibrillation. Continue strategy of heart rate control and anticoagulation.  3. CAD status post CABG in 2004 without active angina symptoms.  4. Valvular heart disease including mild to moderate aortic stenosis, moderate aortic regurgitation, and moderate mitral regurgitation.  Current medicines were reviewed with the patient today.  Disposition: FU with me in 3 months.   Signed, [name], MD, [facility] 02/20/2016 11:49 AM    [facility] [place]. [address] [place], [place] [address] Phone: [phone]; Fax: [phone]

## 2016-03-18 ENCOUNTER — Ambulatory Visit (INDEPENDENT_AMBULATORY_CARE_PROVIDER_SITE_OTHER): Payer: Medicare Other | Admitting: *Deleted

## 2016-03-18 DIAGNOSIS — I480 Paroxysmal atrial fibrillation: Secondary | ICD-10-CM | POA: Diagnosis not present

## 2016-03-18 DIAGNOSIS — Z5181 Encounter for therapeutic drug level monitoring: Secondary | ICD-10-CM

## 2016-03-18 LAB — POCT INR: INR: 2.8

## 2016-04-29 ENCOUNTER — Ambulatory Visit (INDEPENDENT_AMBULATORY_CARE_PROVIDER_SITE_OTHER): Payer: Medicare Other | Admitting: *Deleted

## 2016-04-29 DIAGNOSIS — I480 Paroxysmal atrial fibrillation: Secondary | ICD-10-CM

## 2016-04-29 DIAGNOSIS — Z5181 Encounter for therapeutic drug level monitoring: Secondary | ICD-10-CM | POA: Diagnosis not present

## 2016-04-29 LAB — POCT INR: INR: 2.6

## 2016-05-25 NOTE — Progress Notes (Signed)
Cardiology Office Note  Date: 05/26/2016   ID: Cory Ryan, Cory Ryan Sep 27, 1941, MRN [id number]  PCP: Collene Mares, PA-C  Primary Cardiologist: Rozann Lesches, MD   Chief Complaint  Patient presents with  . Atrial Fibrillation  . Cardiomyopathy    History of Present Illness: Cory Ryan is a 75 y.o. male last seen in May. He is here today with his son for a follow-up visit. Reports adequate control of leg edema using Lasix with as needed intensification from 20 mg to 40 mg daily. His weight is down actually about 10 pounds overall. He does not report any chest pain or palpitations. Currently with NYHA class II dyspnea.  He continues on Coumadin with follow-up in the anticoagulation clinic. No reported bleeding problems.  Most recent echocardiogram is outlined below.  Past Medical History:  Diagnosis Date  . Anemia   . Anxiety   . Aortic stenosis    Moderate  . Atrial fibrillation ([place])   . Chronic systolic heart failure (HCC)    LVEF 40-45%  . CKD (chronic kidney disease) stage 3, GFR 30-59 ml/min   . Colon cancer ([place])   . Colon polyps 12/2005   Per colonoscopy  . COPD (chronic obstructive pulmonary disease) ([place])   . Coronary atherosclerosis of native coronary artery    Multivessel status post CABG  . Emphysema   . Gastric erosions 12/2005   Per EGD - Dr. [name]  . GERD (gastroesophageal reflux disease)   . Hypothyroidism   . Ischemic cardiomyopathy   . Mitral regurgitation    Moderate  . Myocardial infarction [facility])    Remote  . Psoriasis   . Pulmonary nodules     Current Outpatient Prescriptions  Medication Sig Dispense Refill  . albuterol (PROVENTIL) (2.5 MG/3ML) 0.083% nebulizer solution Inhale 3 mLs into the lungs every 4 (four) hours as needed for wheezing or shortness of breath.     [name] atorvastatin (LIPITOR) 40 MG tablet Take 40 mg by mouth every evening.     . carvedilol (COREG) 6.25 MG tablet Take 1 tablet (6.25 mg total) by mouth 2 (two) times daily  with a meal. 60 tablet 11  . diltiazem (CARDIZEM CD) 120 MG 24 hr capsule TAKE (1) CAPSULE BY MOUTH TWICE DAILY. 60 capsule 6  . furosemide (LASIX) 20 MG tablet Take 1 tablet (20 mg total) by mouth daily. 30 tablet 11  . HYDROcodone-acetaminophen (NORCO) 5-325 MG per tablet Take 1-2 tablets by mouth every 4 (four) hours as needed. (Patient taking differently: Take 1 tablet by mouth every 6 (six) hours as needed for moderate pain. ) 40 tablet 0  . Ipratropium-Albuterol (COMBIVENT RESPIMAT) 20-100 MCG/ACT AERS respimat Inhale 1 puff into the lungs as needed. (Patient taking differently: Inhale 1 puff into the lungs as needed for wheezing or shortness of breath. ) 1 Inhaler 1  . loperamide (IMODIUM) 2 MG capsule Take 1 capsule (2 mg total) by mouth as needed for diarrhea or loose stools. (Patient taking differently: Take 2-4 mg by mouth as needed for diarrhea or loose stools. ) 20 capsule 2  . potassium chloride SA (K-DUR,KLOR-CON) 20 MEQ tablet Take 20 mEq by mouth 2 (two) times daily.     [name] warfarin (COUMADIN) 5 MG tablet Take 1 tablet daily except 1/2 tablet on Mondays and Thursdays or As Directed (Patient taking differently: Take 2.5-5 mg by mouth See admin instructions. Take 1 tablet daily except 1/2 tablet on Mondays and Thursdays or As Directed) 30 tablet  6   No current facility-administered medications for this visit.    Allergies:  Review of patient's allergies indicates no known allergies.   Social History: The patient  reports that he quit smoking about 13 years ago. His smoking use included Cigarettes. He has a 30.00 pack-year smoking history. He has never used smokeless tobacco. He reports that he does not drink alcohol or use drugs.   ROS:  Please see the history of present illness. Otherwise, complete review of systems is positive for decreased hearing, arthritic stiffness.  All other systems are reviewed and negative.   Physical Exam: VS:  BP 112/62   Pulse 94   Ht 5\' 8"  (1.727 m)    Wt 166 lb (75.3 kg)   SpO2 96%   BMI 25.24 kg/m , BMI Body mass index is 25.24 kg/m.  Wt Readings from Last 3 Encounters:  05/26/16 166 lb (75.3 kg)  02/20/16 176 lb (79.8 kg)  10/29/15 178 lb (80.7 kg)    General: Elderly male, appears comfortable at rest. HEENT: Conjunctiva and lids normal, oropharynx clear. Neck: Supple, no elevated JVP or carotid bruits, no thyromegaly. Lungs: Decreased breath sounds with prolonged expiratory phase and rhonchi, nonlabored breathing at rest. Cardiac: Irregularly irregular, no S3, 2/6 systolic murmur, no pericardial rub. Abdomen: Soft, nontender, bowel sounds present, no guarding or rebound. Extremities: Bilateral venous stasis, 1+ edema, distal pulses 2+.  ECG: I personally reviewed the tracing from 10/29/2015 which showed atrial fibrillation with IVCD and repolarization abnormalities.  Recent Labwork: 08/08/2015: TSH 1.317 10/29/2015: B Natriuretic Peptide 720.0; BUN 16; Creatinine, Ser 1.20; Hemoglobin 14.9; Platelets 66; Potassium 4.3; Sodium 139     Component Value Date/Time   CHOL 145 08/09/2015 0438   TRIG 75 08/09/2015 0438   HDL 27 (L) 08/09/2015 0438   CHOLHDL 5.4 08/09/2015 0438   VLDL 15 08/09/2015 0438   LDLCALC 103 (H) 08/09/2015 0438    Other Studies Reviewed Today:  Echocardiogram 10/23/2015: Study Conclusions  - Left ventricle: The cavity size was normal. Wall thickness was  increased in a pattern of mild LVH. Systolic function was mildly  to moderately reduced. The estimated ejection fraction was in the  range of 40% to 45%. The study was not technically sufficient to  allow evaluation of LV diastolic dysfunction due to atrial  fibrillation. - Regional wall motion abnormality: Akinesis of the basal  inferoseptal, basal-mid inferior, and mid inferolateral  myocardium; severe hypokinesis of the mid inferoseptal  myocardium; moderate hypokinesis of the mid anteroseptal and  basal anterolateral myocardium; mild  hypokinesis of the mid  anterior and mid anterolateral myocardium. - Aortic valve: Morphologically, there was at least mild aortic  stenosis. Moderately calcified annulus. Mildly thickened,  moderately calcified leaflets. There was trivial regurgitation. - Mitral valve: Calcified annulus. Normal thickness leaflets .  There was moderate regurgitation. - Left atrium: The atrium was moderately to severely dilated. - Right ventricle: Systolic function was moderately to severely  reduced. - Right atrium: The atrium was mildly to moderately dilated. - Atrial septum: No defect or patent foramen ovale was identified. - Tricuspid valve: There was moderate regurgitation. - Pulmonic valve: There was mild regurgitation. - Pulmonary arteries: Systolic pressure was severely increased. PA  peak pressure: 63 mm Hg (S). - Inferior vena cava: Estimated CVP 15 mmHg.  Assessment and Plan:  1. 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 Volume status looks reasonable well controlled on current diuretic regimen. No changes were made today.  2. Chronic atrial fibrillation, continue  Coumadin and heart rate control strategy with combination of Coreg and Cardizem CD. We have discussed possibly simplifying to Coreg alone, although he has needed both agents for adequate control over time. Will continue to readdress this.  3. Mild to moderate aortic stenosis with moderate aortic regurgitation, asymptomatic at this time.  Current medicines were reviewed with the patient today.   Disposition: Follow-up with me in 6 months.  Signed, [name], MD, [facility] 05/26/2016 10:07 AM    [facility] [place]. [address] [place], [place] [address] Phone: [phone]; Fax: [phone]

## 2016-05-26 ENCOUNTER — Encounter: Payer: Self-pay | Admitting: Cardiology

## 2016-05-26 ENCOUNTER — Ambulatory Visit (INDEPENDENT_AMBULATORY_CARE_PROVIDER_SITE_OTHER): Payer: Medicare Other | Admitting: Cardiology

## 2016-05-26 VITALS — BP 112/62 | HR 94 | Ht 68.0 in | Wt 166.0 lb

## 2016-05-26 DIAGNOSIS — I5042 Chronic combined systolic (congestive) and diastolic (congestive) heart failure: Secondary | ICD-10-CM

## 2016-05-26 DIAGNOSIS — I429 Cardiomyopathy, unspecified: Secondary | ICD-10-CM

## 2016-05-26 DIAGNOSIS — I38 Endocarditis, valve unspecified: Secondary | ICD-10-CM

## 2016-05-26 DIAGNOSIS — I482 Chronic atrial fibrillation, unspecified: Secondary | ICD-10-CM

## 2016-05-26 NOTE — Patient Instructions (Signed)
Your physician wants you to follow-up in: 6 months You will receive a reminder letter in the mail two months in advance. If you don't receive a letter, please call our office to schedule the follow-up appointment.     Your physician recommends that you continue on your current medications as directed. Please refer to the Current Medication list given to you today.      Thank you for choosing [facility] !        

## 2016-06-10 ENCOUNTER — Ambulatory Visit (INDEPENDENT_AMBULATORY_CARE_PROVIDER_SITE_OTHER): Payer: Medicare Other | Admitting: *Deleted

## 2016-06-10 DIAGNOSIS — Z5181 Encounter for therapeutic drug level monitoring: Secondary | ICD-10-CM | POA: Diagnosis not present

## 2016-06-10 DIAGNOSIS — I480 Paroxysmal atrial fibrillation: Secondary | ICD-10-CM

## 2016-06-10 LAB — POCT INR: INR: 1.9

## 2016-06-23 ENCOUNTER — Other Ambulatory Visit: Payer: Self-pay | Admitting: Cardiology

## 2016-07-15 ENCOUNTER — Ambulatory Visit (INDEPENDENT_AMBULATORY_CARE_PROVIDER_SITE_OTHER): Payer: Medicare Other | Admitting: *Deleted

## 2016-07-15 DIAGNOSIS — I480 Paroxysmal atrial fibrillation: Secondary | ICD-10-CM

## 2016-07-15 DIAGNOSIS — Z5181 Encounter for therapeutic drug level monitoring: Secondary | ICD-10-CM

## 2016-07-15 LAB — POCT INR: INR: 2

## 2016-08-03 ENCOUNTER — Encounter (HOSPITAL_COMMUNITY): Payer: Medicare Other

## 2016-08-03 ENCOUNTER — Encounter (HOSPITAL_COMMUNITY): Payer: Medicare Other | Attending: Oncology | Admitting: Oncology

## 2016-08-03 ENCOUNTER — Encounter (HOSPITAL_COMMUNITY): Payer: Self-pay | Admitting: Oncology

## 2016-08-03 VITALS — BP 130/59 | HR 66 | Temp 97.5°F | Resp 16 | Ht 65.25 in | Wt 172.5 lb

## 2016-08-03 DIAGNOSIS — D72829 Elevated white blood cell count, unspecified: Secondary | ICD-10-CM

## 2016-08-03 DIAGNOSIS — Z8249 Family history of ischemic heart disease and other diseases of the circulatory system: Secondary | ICD-10-CM | POA: Insufficient documentation

## 2016-08-03 DIAGNOSIS — Z79899 Other long term (current) drug therapy: Secondary | ICD-10-CM | POA: Insufficient documentation

## 2016-08-03 DIAGNOSIS — Z7901 Long term (current) use of anticoagulants: Secondary | ICD-10-CM

## 2016-08-03 DIAGNOSIS — D696 Thrombocytopenia, unspecified: Secondary | ICD-10-CM

## 2016-08-03 DIAGNOSIS — Z87891 Personal history of nicotine dependence: Secondary | ICD-10-CM | POA: Diagnosis not present

## 2016-08-03 DIAGNOSIS — Z85038 Personal history of other malignant neoplasm of large intestine: Secondary | ICD-10-CM | POA: Insufficient documentation

## 2016-08-03 DIAGNOSIS — I4891 Unspecified atrial fibrillation: Secondary | ICD-10-CM

## 2016-08-03 DIAGNOSIS — Z809 Family history of malignant neoplasm, unspecified: Secondary | ICD-10-CM | POA: Diagnosis not present

## 2016-08-03 LAB — CBC WITH DIFFERENTIAL/PLATELET
BASOS PCT: 1 %
Basophils Absolute: 0 10*3/uL (ref 0.0–0.1)
Eosinophils Absolute: 0.2 10*3/uL (ref 0.0–0.7)
Eosinophils Relative: 3 %
HEMATOCRIT: 41.8 % (ref 39.0–52.0)
Hemoglobin: 13.6 g/dL (ref 13.0–17.0)
LYMPHS ABS: 1.5 10*3/uL (ref 0.7–4.0)
Lymphocytes Relative: 26 %
MCH: 30.8 pg (ref 26.0–34.0)
MCHC: 32.5 g/dL (ref 30.0–36.0)
MCV: 94.8 fL (ref 78.0–100.0)
MONO ABS: 0.6 10*3/uL (ref 0.1–1.0)
MONOS PCT: 10 %
NEUTROS ABS: 3.4 10*3/uL (ref 1.7–7.7)
Neutrophils Relative %: 60 %
Platelets: 70 10*3/uL — ABNORMAL LOW (ref 150–400)
RBC: 4.41 MIL/uL (ref 4.22–5.81)
RDW: 16.4 % — AB (ref 11.5–15.5)
WBC: 5.7 10*3/uL (ref 4.0–10.5)

## 2016-08-03 NOTE — Progress Notes (Signed)
[place] @ [facility] [phone]  Fax:([phone]) [place] OB: 1941/09/09  MR#: [id number]  [id number]  Patient Care Team: Cory Munch, PA-C as PCP - General (Physician Assistant) [name], MD as Consulting Physician (Cardiology)  CHIEF COMPLAINT/ INTERVAL HISTORY: 75 year old patient came to hematology oncology clinic for evaluation regarding thrombocytopenia. Last documented platelet count was in June of 2017 was 76,000.  Patient also has slightly indurated white count. Reviewing the records patient has thrombocytopenia documented for last several years and has not significantly changed. Patient does not have any history of bleeding. Patient has a history of severe monetary disease.  Also a history of cardiomyopathy congestive heart failure on chronic Coumadin therapy for atrial fibrillation Last INR was 2.0. Accompanied with her son patient is here for evaluation LAST  hospitalization was in January of 2017 2.  History of adenocarcinoma of colon in 2015 Status post colon resection pT3 pNO  Patient did not receive any further treatment VISIT DIAGNOSIS: Thrombocytopenia         REVIEW OF SYSTEMS:   Gen. status: Patient is alert oriented he is hard of hearing. Lungs: Shortness of breath on exertion.  History of COPD in the past.  As quit smoking several years ago HEENT: Hard of hearing no other significant problems Cardiac: History of cardiomyopathy, atrial fibrillation, congestive heart failure on diuretic therapy and on Coumadin GI: No nausea no vomiting no diarrhea no rectal bleeding.  Patient had a previous history of carcinoma of colon Lower extremities significant swelling of both lower extremity. Neurological status: Denies any headache.  No localized symptoms GU: No hematuria or dysuria Skin: No rash , OCCASIONAL ecchymosis.  As per HPI. Otherwise, a complete review of systems is negatve.  PAST MEDICAL  HISTORY: Past Medical History:  Diagnosis Date  . Anemia   . Anxiety   . Aortic stenosis    Moderate  . Atrial fibrillation ([place])   . Chronic systolic heart failure (HCC)    LVEF 40-45%  . CKD (chronic kidney disease) stage 3, GFR 30-59 ml/min   . Colon cancer ([place])   . Colon polyps 12/2005   Per colonoscopy  . COPD (chronic obstructive pulmonary disease) ([place])   . Coronary atherosclerosis of native coronary artery    Multivessel status post CABG  . Emphysema   . Gastric erosions 12/2005   Per EGD - Dr. [name]  . GERD (gastroesophageal reflux disease)   . Hypothyroidism   . Ischemic cardiomyopathy   . Mitral regurgitation    Moderate  . Myocardial infarction    Remote  . Psoriasis   . Pulmonary nodules     PAST SURGICAL HISTORY: Past Surgical History:  Procedure Laterality Date  . COLECTOMY N/A 12/13/2013   Procedure: TOTAL COLECTOMY;  Surgeon: [name], MD;  Location: [facility] ORS;  Service: General;  Laterality: N/A;  . CORONARY ARTERY BYPASS GRAFT  March 2004   LIMA to LAD, SVG to diagonal, SVG to OM 2, SVG to RCA  . FLEXIBLE SIGMOIDOSCOPY  10/25/2012   Procedure: FLEXIBLE SIGMOIDOSCOPY;  Surgeon: [name], MD;  Location: [facility] ENDO SUITE;  Service: Gastroenterology;  Laterality: N/A;  . PARTIAL COLECTOMY  10/26/2012   Procedure: PARTIAL COLECTOMY;  Surgeon: [name], MD;  Location: [facility] ORS;  Service: General;  Laterality: N/A;  Closure of Colovesical Fistula    FAMILY HISTORY Family History  Problem Relation Age of Onset  . Cancer Brother   . Hypertension  Other   . Heart Problems Mother     unknown        ADVANCED DIRECTIVES: Patient does not have any living will or healthcare power of attorney.  Information was given .  Available resources had been discussed.  We will follow-up on subsequent appointments regarding this issue   HEALTH MAINTENANCE: Social History  Substance Use Topics  . Smoking status: Former Smoker    Packs/day: 1.00    Years:  30.00    Types: Cigarettes    Quit date: 03/17/2003  . Smokeless tobacco: Never Used  . Alcohol use No     Comment: no alcohol use since '04     Colonoscopy:  PAP:  Bone density:  Lipid panel:  No Known Allergies  Current Outpatient Prescriptions  Medication Sig Dispense Refill  . albuterol (PROVENTIL) (2.5 MG/3ML) 0.083% nebulizer solution Inhale 3 mLs into the lungs every 4 (four) hours as needed for wheezing or shortness of breath.     [name] atorvastatin (LIPITOR) 40 MG tablet Take 40 mg by mouth every evening.     . carvedilol (COREG) 6.25 MG tablet Take 1 tablet (6.25 mg total) by mouth 2 (two) times daily with a meal. 60 tablet 11  . diltiazem (CARDIZEM CD) 120 MG 24 hr capsule TAKE (1) CAPSULE BY MOUTH TWICE DAILY. 60 capsule 6  . furosemide (LASIX) 20 MG tablet Take 1 tablet (20 mg total) by mouth daily. 30 tablet 11  . HYDROcodone-acetaminophen (NORCO) 5-325 MG per tablet Take 1-2 tablets by mouth every 4 (four) hours as needed. (Patient taking differently: Take 1 tablet by mouth every 6 (six) hours as needed for moderate pain. ) 40 tablet 0  . Ipratropium-Albuterol (COMBIVENT RESPIMAT) 20-100 MCG/ACT AERS respimat Inhale 1 puff into the lungs as needed. (Patient taking differently: Inhale 1 puff into the lungs as needed for wheezing or shortness of breath. ) 1 Inhaler 1  . loperamide (IMODIUM) 2 MG capsule Take 1 capsule (2 mg total) by mouth as needed for diarrhea or loose stools. (Patient taking differently: Take 2-4 mg by mouth as needed for diarrhea or loose stools. ) 20 capsule 2  . potassium chloride SA (K-DUR,KLOR-CON) 20 MEQ tablet Take 20 mEq by mouth 2 (two) times daily.     [name] warfarin (COUMADIN) 5 MG tablet Take as directed by coumadin clinic 30 tablet 3   No current facility-administered medications for this visit.   Blood pressure (!) 130/59, pulse 66, temperature 97.5 F (36.4 C), temperature source Oral, resp. rate 16, height 5' 5.25" (1.657 m), weight 172 lb 8 oz  (78.2 kg), SpO2 94 %.   OBJECTIVE: PHYSICAL EXAM: Patient is alert oriented not any acute distress Performance status is 2 Since last evaluation there is no significant change in the general status HEENT: No evidence of stomatitis. Sclera and conjunctivae :: No jaundice.   pale looking. Lungs: Air  entry equal on both sides.  No rhonchi.  No rales.  Cardiac: Soft systolic murmur and irregular heart sounds  Lymphatic system: Cervical, axillary, inguinal, lymph nodes not palpable GI: Abdomen is soft.liver and spleen not palpable.  No ascites.  Bowel sounds are normal.  No other palpable masses.  No tenderness . Lower extremity: Significant edema 3+ in both lower extremity Neurological system: Higher functions, cranial nerves intact no evidence of peripheral neuropathy. Skin: No rash.  No ecchymosis.. No petechial hemorrhages    ECOG FS:2 - Symptomatic, <50% confined to bed  LAB RESULTS:  No visits with  results within 5 Day(s) from this visit.  Latest known visit with results is:  Anti-coag visit on 07/15/2016  Component Date Value Ref Range Status  . INR 07/15/2016 2.0   Final   CBC Latest Ref Rng & Units 10/29/2015 08/10/2015 08/09/2015  WBC 4.0 - 10.5 K/uL 11.7(H) 6.3 5.6  Hemoglobin 13.0 - 17.0 g/dL 14.9 13.2 13.1  Hematocrit 39.0 - 52.0 % 46.1 40.9 39.8  Platelets 150 - 400 K/uL 66(L) 66(L) 69(L)     STUDIES: No results found.  ASSESSMENT:  CHRONIC  thrombocytopenia in this patient where documented cirrhosis of liver in 2007 with a CT scan.  Patient has documented low platelet count in the range of 60-75 and he essentially has not changed. Platelet count on last blood check was in June of 2017 and was documented to the 76,000 Mild leukocytosis. Possible differential diagnosis His low platelet count secondary to splenomegaly secondary to cirrhosis of liver will proceed with ultrasound of abdomen to evaluate spleen size Low grade ITP cannot be ruled out will check platelet  antibodies   depending on that result further evaluation if needed can decline with bone marrow aspiration and biopsy Patient carries slightly higher risk bleeding ,as he is on Coumadin however INR is being managed very carefully.  And so far has not demonstrated any significant evidence of bleeding  Detailed discussion with the patient explaining the nature of disease.  Son was present during our discussion and demonstraTED  understanding of the disease process  Today's lab data has been pending  Patient expressed understanding and was in agreement with this plan. He also understands that He can call clinic at any time with any questions, concerns, or complaints.    No matching staging information was found for the patient.  Forest Gleason, MD   08/03/2016 2:10 PM

## 2016-08-03 NOTE — Patient Instructions (Addendum)
[place] at [facility] Discharge Instructions  RECOMMENDATIONS MADE BY THE CONSULTANT AND ANY TEST RESULTS WILL BE SENT TO YOUR REFERRING PHYSICIAN.  You saw Dr. Oliva Bustard in place of Dr. Whitney Muse today. Platelets are low most likely because of cirrhosis of the liver.  [place] will be scheduled. Labs today.   Thank you for choosing [place] at [facility] to provide your oncology and hematology care.  To afford each patient quality time with our provider, please arrive at least 15 minutes before your scheduled appointment time.   Beginning January 23rd 2017 lab work for the [facility] will be done in the  Main lab at [facility] on 1st floor. If you have a lab appointment with the [facility] please come in thru the  Main Entrance and check in at the main information desk  You need to re-schedule your appointment should you arrive 10 or more minutes late.  We strive to give you quality time with our providers, and arriving late affects you and other patients whose appointments are after yours.  Also, if you no show three or more times for appointments you may be dismissed from the clinic at the providers discretion.     Again, thank you for choosing [facility].  Our hope is that these requests will decrease the amount of time that you wait before being seen by our physicians.       _____________________________________________________________  Should you have questions after your visit to [facility], please contact our office at [phone] between the hours of 8:30 a.m. and 4:30 p.m.  Voicemails left after 4:30 p.m. will not be returned until the following business day.  For prescription refill requests, have your pharmacy contact our office.         Resources For Cancer Patients and their Caregivers ? American Cancer Society: Can assist with transportation, wigs, general needs, runs Look Good Feel Better.         830 250 9506 ? Cancer Care: Provides financial assistance, online support groups, medication/co-pay assistance.  1-800-813-HOPE 973-765-0419) ? [place] Assists [place] cancer patients and their families through emotional , educational and financial support.  579-811-1599 ? [place] DSS Where to apply for food stamps, Medicaid and utility assistance. 418-521-9134 ? RCATS: Transportation to medical appointments. 724 492 4608 ? Social Security Administration: May apply for disability if have a Stage IV cancer. (978)513-1136 514-503-0216 ? [employer], Disability and Transit Services: Assists with nutrition, care and transit needs. [place] Support Programs: @10RELATIVEDAYS @ > Cancer Support Group  2nd Tuesday of the month 1pm-2pm, Journey Room  > Creative Journey  3rd Tuesday of the month 1130am-1pm, Journey Room  > Look Good Feel Better  1st Wednesday of the month 10am-12 noon, Journey Room (Call [place] to register [phone])

## 2016-08-06 ENCOUNTER — Telehealth: Payer: Self-pay | Admitting: Cardiology

## 2016-08-06 ENCOUNTER — Ambulatory Visit (HOSPITAL_COMMUNITY)
Admission: RE | Admit: 2016-08-06 | Discharge: 2016-08-06 | Disposition: A | Discharge status: Home / Self Care | Payer: Medicare Other | Source: Ambulatory Visit | Attending: Oncology | Admitting: Oncology

## 2016-08-06 ENCOUNTER — Other Ambulatory Visit: Payer: Self-pay

## 2016-08-06 DIAGNOSIS — J90 Pleural effusion, not elsewhere classified: Secondary | ICD-10-CM | POA: Diagnosis not present

## 2016-08-06 DIAGNOSIS — D696 Thrombocytopenia, unspecified: Secondary | ICD-10-CM | POA: Insufficient documentation

## 2016-08-06 DIAGNOSIS — I714 Abdominal aortic aneurysm, without rupture: Secondary | ICD-10-CM | POA: Insufficient documentation

## 2016-08-06 MED ORDER — CARVEDILOL 6.25 MG PO TABS: 6.2500 mg | ORAL_TABLET | Freq: Two times a day (BID) | ORAL | 11 refills | Status: DC

## 2016-08-06 NOTE — Telephone Encounter (Signed)
°*  STAT* If patient is at the pharmacy, call can be transferred to refill team.   1. Which medications need to be refilled? (please list name of each medication and dose if known) Carvedilol  2. Which pharmacy/location (including street and city if local pharmacy) is medication to be sent to?[facility] 3. Do they need a 30 day or 90 day supply? 30 day

## 2016-08-20 ENCOUNTER — Other Ambulatory Visit: Payer: Self-pay | Admitting: Cardiology

## 2016-08-26 ENCOUNTER — Ambulatory Visit (INDEPENDENT_AMBULATORY_CARE_PROVIDER_SITE_OTHER): Payer: Medicare Other | Admitting: *Deleted

## 2016-08-26 DIAGNOSIS — I480 Paroxysmal atrial fibrillation: Secondary | ICD-10-CM

## 2016-08-26 DIAGNOSIS — Z5181 Encounter for therapeutic drug level monitoring: Secondary | ICD-10-CM | POA: Diagnosis not present

## 2016-08-26 LAB — POCT INR: INR: 3.7

## 2016-09-02 ENCOUNTER — Telehealth: Payer: Self-pay | Admitting: Cardiology

## 2016-09-02 MED ORDER — FUROSEMIDE 20 MG PO TABS: 20.0000 mg | ORAL_TABLET | Freq: Every day | ORAL | 3 refills | Status: DC

## 2016-09-02 MED ORDER — FUROSEMIDE 20 MG PO TABS
20.0000 mg | ORAL_TABLET | Freq: Every day | ORAL | 3 refills | Status: DC
Start: 2016-09-02 — End: 2016-09-02

## 2016-09-02 NOTE — Telephone Encounter (Signed)
Lasix 90 days- 11/22- sent to [facility]

## 2016-09-02 NOTE — Telephone Encounter (Signed)
1. Which medications need to be refilled? (please list name of each medication and dose if known)  furosemide (LASIX) 20 MG tablet [id number]   2. Which pharmacy/location (including street and city if local pharmacy) is medication to be sent to? [place]   3. Do they need a 30 day or 90 day supply?  90 day

## 2016-09-16 ENCOUNTER — Ambulatory Visit (INDEPENDENT_AMBULATORY_CARE_PROVIDER_SITE_OTHER): Payer: Medicare Other | Admitting: *Deleted

## 2016-09-16 DIAGNOSIS — I480 Paroxysmal atrial fibrillation: Secondary | ICD-10-CM

## 2016-09-16 DIAGNOSIS — Z5181 Encounter for therapeutic drug level monitoring: Secondary | ICD-10-CM

## 2016-09-16 LAB — POCT INR
INR: 2.9
INR: 3.9

## 2016-09-22 ENCOUNTER — Other Ambulatory Visit: Payer: Self-pay | Admitting: Cardiology

## 2016-09-22 NOTE — Telephone Encounter (Signed)
Per Larene Pickett pharmacist "Tripp" rx is filed and waiting for pt to pick up,says he will call pt

## 2016-09-22 NOTE — Telephone Encounter (Signed)
°*  STAT* If patient is at the pharmacy, call can be transferred to refill team.   1. Which medications need to be refilled? (please list name of each medication and dose if known) diltiazem (CARDIZEM CD) 120 MG 24 hr capsule [id number]   2. Which pharmacy/location (including street and city if local pharmacy) is medication to be sent to? [place]  3. Do they need a 30 day or 90 day supply?  30 day

## 2016-09-25 ENCOUNTER — Other Ambulatory Visit (HOSPITAL_COMMUNITY): Payer: Self-pay

## 2016-09-25 DIAGNOSIS — D696 Thrombocytopenia, unspecified: Secondary | ICD-10-CM

## 2016-09-28 ENCOUNTER — Encounter (HOSPITAL_COMMUNITY): Payer: Medicare Other | Attending: Oncology | Admitting: Hematology & Oncology

## 2016-09-28 ENCOUNTER — Encounter (HOSPITAL_COMMUNITY): Payer: Medicare Other

## 2016-09-28 VITALS — BP 130/77 | HR 80 | Temp 97.6°F | Resp 16 | Wt 173.2 lb

## 2016-09-28 DIAGNOSIS — Z85038 Personal history of other malignant neoplasm of large intestine: Secondary | ICD-10-CM | POA: Diagnosis not present

## 2016-09-28 DIAGNOSIS — D693 Immune thrombocytopenic purpura: Secondary | ICD-10-CM

## 2016-09-28 DIAGNOSIS — Z809 Family history of malignant neoplasm, unspecified: Secondary | ICD-10-CM | POA: Insufficient documentation

## 2016-09-28 DIAGNOSIS — I4891 Unspecified atrial fibrillation: Secondary | ICD-10-CM | POA: Diagnosis not present

## 2016-09-28 DIAGNOSIS — Z87891 Personal history of nicotine dependence: Secondary | ICD-10-CM | POA: Diagnosis not present

## 2016-09-28 DIAGNOSIS — I509 Heart failure, unspecified: Secondary | ICD-10-CM

## 2016-09-28 DIAGNOSIS — C189 Malignant neoplasm of colon, unspecified: Secondary | ICD-10-CM

## 2016-09-28 DIAGNOSIS — Z7901 Long term (current) use of anticoagulants: Secondary | ICD-10-CM

## 2016-09-28 DIAGNOSIS — D72829 Elevated white blood cell count, unspecified: Secondary | ICD-10-CM | POA: Diagnosis not present

## 2016-09-28 DIAGNOSIS — Z8249 Family history of ischemic heart disease and other diseases of the circulatory system: Secondary | ICD-10-CM | POA: Diagnosis not present

## 2016-09-28 DIAGNOSIS — D696 Thrombocytopenia, unspecified: Secondary | ICD-10-CM

## 2016-09-28 DIAGNOSIS — Z79899 Other long term (current) drug therapy: Secondary | ICD-10-CM | POA: Insufficient documentation

## 2016-09-28 LAB — CBC WITH DIFFERENTIAL/PLATELET
BASOS ABS: 0 10*3/uL (ref 0.0–0.1)
Basophils Relative: 1 %
EOS ABS: 0.2 10*3/uL (ref 0.0–0.7)
Eosinophils Relative: 3 %
HCT: 42.4 % (ref 39.0–52.0)
HEMOGLOBIN: 13.4 g/dL (ref 13.0–17.0)
LYMPHS ABS: 1 10*3/uL (ref 0.7–4.0)
Lymphocytes Relative: 22 %
MCH: 30.2 pg (ref 26.0–34.0)
MCHC: 31.6 g/dL (ref 30.0–36.0)
MCV: 95.7 fL (ref 78.0–100.0)
Monocytes Absolute: 0.5 10*3/uL (ref 0.1–1.0)
Monocytes Relative: 11 %
NEUTROS PCT: 63 %
Neutro Abs: 3 10*3/uL (ref 1.7–7.7)
Platelets: 64 10*3/uL — ABNORMAL LOW (ref 150–400)
RBC: 4.43 MIL/uL (ref 4.22–5.81)
RDW: 15.4 % (ref 11.5–15.5)
WBC: 4.7 10*3/uL (ref 4.0–10.5)

## 2016-09-28 NOTE — Patient Instructions (Addendum)
[place] at [facility] Discharge Instructions  RECOMMENDATIONS MADE BY THE CONSULTANT AND ANY TEST RESULTS WILL BE SENT TO YOUR REFERRING PHYSICIAN.  You saw Dr.Penland today. Lab work only in 6 weeks. Follow up with MD in 3 months with lab work. See Amy at checkout for appointments.  Thank you for choosing [place] at [facility] to provide your oncology and hematology care.  To afford each patient quality time with our provider, please arrive at least 15 minutes before your scheduled appointment time.   Beginning January 23rd 2017 lab work for the [facility] will be done in the  Main lab at [facility] on 1st floor. If you have a lab appointment with the [facility] please come in thru the  Main Entrance and check in at the main information desk  You need to re-schedule your appointment should you arrive 10 or more minutes late.  We strive to give you quality time with our providers, and arriving late affects you and other patients whose appointments are after yours.  Also, if you no show three or more times for appointments you may be dismissed from the clinic at the providers discretion.     Again, thank you for choosing [facility].  Our hope is that these requests will decrease the amount of time that you wait before being seen by our physicians.       _____________________________________________________________  Should you have questions after your visit to [facility], please contact our office at [phone] between the hours of 8:30 a.m. and 4:30 p.m.  Voicemails left after 4:30 p.m. will not be returned until the following business day.  For prescription refill requests, have your pharmacy contact our office.         Resources For Cancer Patients and their Caregivers ? American Cancer Society: Can assist with transportation, wigs, general needs, runs Look Good Feel Better.         442-153-2773 ? Cancer Care: Provides financial assistance, online support groups, medication/co-pay assistance.  1-800-813-HOPE (727) 180-8162) ? [place] Assists [place] cancer patients and their families through emotional , educational and financial support.  (309) 061-6251 ? [place] DSS Where to apply for food stamps, Medicaid and utility assistance. 647-010-7667 ? RCATS: Transportation to medical appointments. 7197842917 ? Social Security Administration: May apply for disability if have a Stage IV cancer. 3434579785 289 091 0146 ? [employer], Disability and Transit Services: Assists with nutrition, care and transit needs. [place] Support Programs: @10RELATIVEDAYS @ > Cancer Support Group  2nd Tuesday of the month 1pm-2pm, Journey Room  > Creative Journey  3rd Tuesday of the month 1130am-1pm, Journey Room  > Look Good Feel Better  1st Wednesday of the month 10am-12 noon, Journey Room (Call [place] to register [phone])

## 2016-09-28 NOTE — Progress Notes (Signed)
PROGRESS NOTE  Cory Ryan OB: 04-Feb-1941  MR#: [id number]  [id number]  Patient Care Team: [name], MD as PCP - General (Family Medicine) [name], MD as Consulting Physician (Cardiology)  CHIEF COMPLAINT/ INTERVAL HISTORY: 75 year old patient came to hematology oncology clinic for evaluation regarding thrombocytopenia. Last documented platelet count was in June of 2017 was 76,000.  Patient also has slightly elevated white count. Reviewing the records patient has thrombocytopenia documented for last several years and has not significantly changed. Patient does not have any history of bleeding. Patient has a history of cardiomyopathy, congestive heart failure on chronic Coumadin therapy for atrial fibrillation Last INR was 2.0.  He is accompanied with his son who he lives with.  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  [name] ambulates with the use of a cane. I have personally reviewed the labs and images with the patient.   He sees [name] for cardiology. He had his INR checked last week. [name] normally checks his INR every 2 weeks.   He says that he feels good today. He has been eating well.  He does bruise easily.   He has heart problems, which cause leg swelling. He is on medication for this.   Denies blood in his stool, hematuria, or any other complaints.   REVIEW OF SYSTEMS:   Review of Systems  Constitutional: Negative.   HENT: Negative.   Eyes: Negative.   Respiratory: Negative.   Cardiovascular: Positive for leg swelling.  Gastrointestinal: Negative.  Negative for blood in stool.  Genitourinary: Negative.  Negative for hematuria.  Musculoskeletal: Negative.   Skin: Negative.   Neurological: Negative.   Endo/Heme/Allergies:  Bruises/bleeds easily.  Psychiatric/Behavioral: Negative.   All other systems reviewed and are negative.   As per HPI. Otherwise, a complete review of systems is negatve.  PAST MEDICAL HISTORY: Past Medical History:  Diagnosis Date  . Anemia   . Anxiety   . Aortic stenosis    Moderate  . Atrial fibrillation ([place])   . Chronic systolic heart failure (HCC)    LVEF 40-45%  . CKD (chronic kidney disease) stage 3, GFR 30-59 ml/min   . Colon cancer ([place])   . Colon polyps 12/2005   Per colonoscopy  . COPD (chronic obstructive pulmonary disease) ([place])   . Coronary atherosclerosis of native coronary artery    Multivessel status post CABG  . Emphysema   . Gastric erosions 12/2005   Per EGD - Dr. [name]  . GERD (gastroesophageal reflux disease)   . Hypothyroidism   . Ischemic cardiomyopathy   . Mitral regurgitation    Moderate  . Myocardial infarction    Remote  . Psoriasis   . Pulmonary nodules     PAST SURGICAL HISTORY: Past Surgical History:  Procedure Laterality Date  . COLECTOMY N/A 12/13/2013   Procedure: TOTAL COLECTOMY;  Surgeon: [name], MD;  Location: [facility];  Service: General;  Laterality: N/A;  . CORONARY ARTERY BYPASS GRAFT  March 2004   LIMA to LAD, SVG to diagonal, SVG to OM 2, SVG to RCA  . FLEXIBLE SIGMOIDOSCOPY  10/25/2012   Procedure: FLEXIBLE SIGMOIDOSCOPY;  Surgeon: [name], MD;  Location: [facility];  Service: Gastroenterology;  Laterality: N/A;  . PARTIAL COLECTOMY  10/26/2012   Procedure:  PARTIAL COLECTOMY;  Surgeon: [name], MD;  Location: [facility];  Service: General;  Laterality: N/A;  Closure of Colovesical Fistula    FAMILY HISTORY Family History  Problem Relation Age of Onset  . Cancer Brother   . Heart Problems Mother     unknown  . Hypertension Other      ADVANCED DIRECTIVES: Patient does not have any living will or healthcare power of attorney.  Information was given .  Available resources had been discussed.  We will  follow-up on subsequent appointments regarding this issue   HEALTH MAINTENANCE: Social History  Substance Use Topics  . Smoking status: Former Smoker    Packs/day: 1.00    Years: 30.00    Types: Cigarettes    Quit date: 03/17/2003  . Smokeless tobacco: Never Used  . Alcohol use No     Comment: no alcohol use since '04     Colonoscopy: 10/25/2012  PAP: N/A  Bone density: Never  Lipid panel: 08/09/2015  No Known Allergies  Current Outpatient Prescriptions  Medication Sig Dispense Refill  . albuterol (PROVENTIL) (2.5 MG/3ML) 0.083% nebulizer solution Inhale 3 mLs into the lungs every 4 (four) hours as needed for wheezing or shortness of breath.     [name] atorvastatin (LIPITOR) 40 MG tablet Take 40 mg by mouth every evening.     . carvedilol (COREG) 6.25 MG tablet Take 1 tablet (6.25 mg total) by mouth 2 (two) times daily with a meal. 60 tablet 11  . diltiazem (CARDIZEM CD) 120 MG 24 hr capsule TAKE (1) CAPSULE BY MOUTH TWICE DAILY. 60 capsule 6  . furosemide (LASIX) 20 MG tablet Take 1 tablet (20 mg total) by mouth daily. 90 tablet 3  . HYDROcodone-acetaminophen (NORCO) 5-325 MG per tablet Take 1-2 tablets by mouth every 4 (four) hours as needed. (Patient taking differently: Take 1 tablet by mouth every 6 (six) hours as needed for moderate pain. ) 40 tablet 0  . Ipratropium-Albuterol (COMBIVENT RESPIMAT) 20-100 MCG/ACT AERS respimat Inhale 1 puff into the lungs as needed. (Patient taking differently: Inhale 1 puff into the lungs as needed for wheezing or shortness of breath. ) 1 Inhaler 1  . loperamide (IMODIUM) 2 MG capsule Take 1 capsule (2 mg total) by mouth as needed for diarrhea or loose stools. (Patient taking differently: Take 2-4 mg by mouth as needed for diarrhea or loose stools. ) 20 capsule 2  . potassium chloride SA (K-DUR,KLOR-CON) 20 MEQ tablet Take 20 mEq by mouth 2 (two) times daily.     [name] warfarin (COUMADIN) 5 MG tablet Take as directed by coumadin clinic 30 tablet 3   No  current facility-administered medications for this visit.   There were no vitals taken for this visit.  Vitals with BMI 09/28/2016  Height   Weight 173 lbs 3 oz  BMI   Systolic [id number]  Diastolic 77  Pulse 80  Respirations 16    OBJECTIVE: Physical Exam  Constitutional: He is oriented to person, place, and time and well-developed, well-nourished, and in no distress.  Ambulates with use of cane.  Pt was able to get on exam table with assistance.   HENT:  Head: Normocephalic and atraumatic.  Mouth/Throat: Oropharynx is clear and moist.  Eyes: Conjunctivae and EOM are normal. Pupils are equal, round, and reactive to light.  Neck: Normal range of motion. Neck supple.  Cardiovascular: Normal rate, regular rhythm and normal heart sounds.   Pulmonary/Chest: Effort normal and breath sounds normal.  Abdominal: Soft.  Bowel sounds are normal.  Musculoskeletal: Normal range of motion. He exhibits edema (legs).  Neurological: He is alert and oriented to person, place, and time. Gait normal.  Skin: Skin is warm and dry.  Nursing note and vitals reviewed.     ECOG FS:2 - Symptomatic, <50% confined to bed  LAB RESULTS:  No visits with results within 5 Day(s) from this visit.  Latest known visit with results is:  Anti-coag visit on 09/16/2016  Component Date Value Ref Range Status  . INR 09/16/2016 3.9   Final  . INR 09/16/2016 2.9   Final   CBC Latest Ref Rng & Units 08/03/2016 10/29/2015 08/10/2015  WBC 4.0 - 10.5 K/uL 5.7 11.7(H) 6.3  Hemoglobin 13.0 - 17.0 g/dL 13.6 14.9 13.2  Hematocrit 39.0 - 52.0 % 41.8 46.1 40.9  Platelets 150 - 400 K/uL 70(L) 66(L) 66(L)     STUDIES: No results found. Study Result   CLINICAL DATA:  Cirrhosis. Thrombocytopenia. History of colon cancer. Chronic kidney disease stage 3.  EXAM: ABDOMEN ULTRASOUND COMPLETE  COMPARISON:  CT abdomen and pelvis 11/24/2013. Abdominal ultrasound 12/21/2005.  FINDINGS: Gallbladder: No gallstones visualized.  Mild gallbladder wall thickening, measuring 4.5 mm. No sonographic Murphy sign noted by sonographer.  Common bile duct: Diameter: 5.5 mm  Liver: No focal lesion identified. Within normal limits in parenchymal echogenicity.  IVC: No abnormality visualized.  Pancreas: Visualized portion unremarkable.  Spleen: Size and appearance within normal limits.  Right Kidney: Length: 11.1 cm. Echogenicity within normal limits. No mass or hydronephrosis visualized.  Left Kidney: Length: 10.2 cm. Echogenicity within normal limits. No mass or hydronephrosis visualized.  Abdominal aorta: Mid abdominal aorta measures 4.6 x 4.5 cm, increased from the prior CT.  Other findings: Bilateral pleural effusions are partially visualized, right larger than left.  IMPRESSION: 1. No liver abnormality identified. 2. Mild gallbladder wall thickening without gallstones or other evidence of acute cholecystitis. This may be secondary to hypoproteinemia or liver disease. 3. Bilateral pleural effusions. 4. 4.6 cm infrarenal abdominal aortic aneurysm, enlarged from 2015.   Electronically Signed   By: Logan Bores M.D.   On: 08/06/2016 09:58    ASSESSMENT:  CHRONIC  thrombocytopenia  Mild leukocytosis. Normal size spleen ITP Coumadin therapy History of colon cancer  Patient is educated about ITP.  He is providedinformation regarding this disease.  Documented nodular contour of the liver noted on CT imaging several years back, current ultrasound shows normal liver. 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   Despite thrombocytopenia, ongoing Vitamin K antagonist therapy is recommended. If platelet count falls below 60K will start on therapy for ITP. May also consider BMBX to rule out possibility of MDS.  We will continue to monitor his platelets.   He will return for blood work in 6 weeks. If these results are stable, then he will return for a follow up in 3 months.   Patient expressed understanding and was  in agreement with this plan. He also understands that He can call clinic at any time with any questions, concerns, or complaints. He was instructed on signs and symptoms of concern such as nose bleeding, gum bleeding, unusual bruising.   This document serves as a record of services personally performed by Ancil Linsey, MD. It was created on her behalf by Martinique Casey, a trained medical scribe. The creation of this record is based on the scribe's personal observations and the provider's statements to them. This document has been checked and approved by the attending provider.  I have  reviewed the above documentation for accuracy and completeness and I agree with the above.  Molli Hazard, MD   09/28/2016 9:25 AM

## 2016-09-29 ENCOUNTER — Encounter (HOSPITAL_COMMUNITY): Payer: Self-pay | Admitting: Hematology & Oncology

## 2016-10-06 ENCOUNTER — Encounter (HOSPITAL_COMMUNITY): Payer: Self-pay | Admitting: Hematology & Oncology

## 2016-10-21 ENCOUNTER — Ambulatory Visit (INDEPENDENT_AMBULATORY_CARE_PROVIDER_SITE_OTHER): Payer: Medicare Other | Admitting: *Deleted

## 2016-10-21 DIAGNOSIS — I480 Paroxysmal atrial fibrillation: Secondary | ICD-10-CM

## 2016-10-21 DIAGNOSIS — Z5181 Encounter for therapeutic drug level monitoring: Secondary | ICD-10-CM

## 2016-10-21 LAB — POCT INR: INR: 4.9

## 2016-11-04 ENCOUNTER — Ambulatory Visit (INDEPENDENT_AMBULATORY_CARE_PROVIDER_SITE_OTHER): Payer: Medicare Other | Admitting: *Deleted

## 2016-11-04 DIAGNOSIS — I480 Paroxysmal atrial fibrillation: Secondary | ICD-10-CM | POA: Diagnosis not present

## 2016-11-04 DIAGNOSIS — Z5181 Encounter for therapeutic drug level monitoring: Secondary | ICD-10-CM | POA: Diagnosis not present

## 2016-11-04 LAB — POCT INR: INR: 3.3

## 2016-11-09 ENCOUNTER — Other Ambulatory Visit (HOSPITAL_COMMUNITY): Payer: Medicare Other

## 2016-11-10 ENCOUNTER — Other Ambulatory Visit (HOSPITAL_COMMUNITY): Payer: Medicare Other

## 2016-11-23 ENCOUNTER — Ambulatory Visit (INDEPENDENT_AMBULATORY_CARE_PROVIDER_SITE_OTHER): Payer: Medicare Other | Admitting: Cardiology

## 2016-11-23 ENCOUNTER — Encounter: Payer: Self-pay | Admitting: Cardiology

## 2016-11-23 ENCOUNTER — Ambulatory Visit (INDEPENDENT_AMBULATORY_CARE_PROVIDER_SITE_OTHER): Payer: Medicare Other | Admitting: *Deleted

## 2016-11-23 VITALS — BP 112/62 | HR 85 | Ht 70.0 in | Wt 172.0 lb

## 2016-11-23 DIAGNOSIS — Z5181 Encounter for therapeutic drug level monitoring: Secondary | ICD-10-CM | POA: Diagnosis not present

## 2016-11-23 DIAGNOSIS — I482 Chronic atrial fibrillation, unspecified: Secondary | ICD-10-CM

## 2016-11-23 DIAGNOSIS — I350 Nonrheumatic aortic (valve) stenosis: Secondary | ICD-10-CM | POA: Diagnosis not present

## 2016-11-23 DIAGNOSIS — I480 Paroxysmal atrial fibrillation: Secondary | ICD-10-CM

## 2016-11-23 DIAGNOSIS — I2510 Atherosclerotic heart disease of native coronary artery without angina pectoris: Secondary | ICD-10-CM

## 2016-11-23 DIAGNOSIS — I5042 Chronic combined systolic (congestive) and diastolic (congestive) heart failure: Secondary | ICD-10-CM

## 2016-11-23 LAB — POCT INR: INR: 3.8

## 2016-11-23 NOTE — Patient Instructions (Addendum)
Your physician wants you to follow-up in: 6 months Dr McDowell You will receive a reminder letter in the mail two months in advance. If you don't receive a letter, please call our office to schedule the follow-up appointment.  Your physician recommends that you continue on your current medications as directed. Please refer to the Current Medication list given to you today.    If you need a refill on your cardiac medications before your next appointment, please call your pharmacy.       Thank you for choosing [facility] !         

## 2016-11-23 NOTE — Progress Notes (Signed)
Cardiology Office Note  Date: 11/23/2016   ID: Monti, Tashjian 1941/05/15, MRN [id number]  PCP: Purvis Kilts, MD  Primary Cardiologist: Rozann Lesches, MD   Chief Complaint  Patient presents with  . Atrial Fibrillation    History of Present Illness: Cory Ryan is a 76 y.o. male last seen in August 2017. He is here today with his son for a follow-up visit. Overall no major changes. He has had some trouble with his COPD over the weather months. No febrile illnesses. He does not report any palpitations. He reports compliance with his medications which are outlined below.  He remains on Coumadin with follow-up in the anticoagulation clinic. INR was supratherapeutic today and dose adjusted. He has had no major bleeding problems.  Echocardiogram from last year is reviewed below.  Past Medical History:  Diagnosis Date  . Anemia   . Anxiety   . Aortic stenosis    Moderate  . Atrial fibrillation ([place])   . Chronic systolic heart failure (HCC)    LVEF 40-45%  . CKD (chronic kidney disease) stage 3, GFR 30-59 ml/min   . Colon cancer ([place])   . Colon polyps 12/2005   Per colonoscopy  . COPD (chronic obstructive pulmonary disease) ([place])   . Coronary atherosclerosis of native coronary artery    Multivessel status post CABG  . Emphysema   . Gastric erosions 12/2005   Per EGD - Dr. [name]  . GERD (gastroesophageal reflux disease)   . Hypothyroidism   . Ischemic cardiomyopathy   . Mitral regurgitation    Moderate  . Myocardial infarction    Remote  . Psoriasis   . Pulmonary nodules     Current Outpatient Prescriptions  Medication Sig Dispense Refill  . albuterol (PROVENTIL) (2.5 MG/3ML) 0.083% nebulizer solution Inhale 3 mLs into the lungs every 4 (four) hours as needed for wheezing or shortness of breath.     [name] atorvastatin (LIPITOR) 40 MG tablet Take 40 mg by mouth every evening.     . carvedilol (COREG) 6.25 MG tablet Take 1 tablet (6.25 mg total) by mouth 2 (two)  times daily with a meal. 60 tablet 11  . diltiazem (CARDIZEM CD) 120 MG 24 hr capsule TAKE (1) CAPSULE BY MOUTH TWICE DAILY. 60 capsule 6  . furosemide (LASIX) 20 MG tablet Take 1 tablet (20 mg total) by mouth daily. 90 tablet 3  . HYDROcodone-acetaminophen (NORCO) 5-325 MG per tablet Take 1-2 tablets by mouth every 4 (four) hours as needed. (Patient taking differently: Take 1 tablet by mouth every 6 (six) hours as needed for moderate pain. ) 40 tablet 0  . Ipratropium-Albuterol (COMBIVENT RESPIMAT) 20-100 MCG/ACT AERS respimat Inhale 1 puff into the lungs as needed. (Patient taking differently: Inhale 1 puff into the lungs as needed for wheezing or shortness of breath. ) 1 Inhaler 1  . loperamide (IMODIUM) 2 MG capsule Take 1 capsule (2 mg total) by mouth as needed for diarrhea or loose stools. (Patient taking differently: Take 2-4 mg by mouth as needed for diarrhea or loose stools. ) 20 capsule 2  . potassium chloride SA (K-DUR,KLOR-CON) 20 MEQ tablet Take 20 mEq by mouth 2 (two) times daily.     [name] warfarin (COUMADIN) 5 MG tablet Take as directed by coumadin clinic 30 tablet 3   No current facility-administered medications for this visit.    Allergies:  Patient has no known allergies.   Social History: The patient  reports that he quit smoking  about 13 years ago. His smoking use included Cigarettes. He has a 30.00 pack-year smoking history. He has never used smokeless tobacco. He reports that he does not drink alcohol or use drugs.   ROS:  Please see the history of present illness. Otherwise, complete review of systems is positive for none.  All other systems are reviewed and negative.   Physical Exam: VS:  BP 112/62   Pulse 85   Ht 5\' 10"  (1.778 m)   Wt 172 lb (78 kg)   SpO2 94%   BMI 24.68 kg/m , BMI Body mass index is 24.68 kg/m.  Wt Readings from Last 3 Encounters:  11/23/16 172 lb (78 kg)  09/28/16 173 lb 3.2 oz (78.6 kg)  08/03/16 172 lb 8 oz (78.2 kg)    General: Elderly  male, appears comfortable at rest. HEENT: Conjunctiva and lids normal, oropharynx clear. Neck: Supple, no elevated JVP or carotid bruits, no thyromegaly. Lungs: Decreased breath sounds with prolonged expiratory phase and rhonchi, nonlabored breathing at rest. Cardiac: Irregularly irregular, no S3, 2/6 systolic murmur, no pericardial rub. Abdomen: Soft, nontender, bowel sounds present, no guarding or rebound. Extremities: Bilateral venous stasis, 1+ edema, distal pulses 2+.  ECG: I personally reviewed the tracing from 10/29/2015 which showed atrial fibrillation with repolarization abnormalities.  Recent Labwork: 09/28/2016: Hemoglobin 13.4; Platelets 64     Component Value Date/Time   CHOL 145 08/09/2015 0438   TRIG 75 08/09/2015 0438   HDL 27 (L) 08/09/2015 0438   CHOLHDL 5.4 08/09/2015 0438   VLDL 15 08/09/2015 0438   LDLCALC 103 (H) 08/09/2015 0438    Other Studies Reviewed Today:  Echocardiogram 10/23/2015: Study Conclusions  - Left ventricle: The cavity size was normal. Wall thickness was  increased in a pattern of mild LVH. Systolic function was mildly  to moderately reduced. The estimated ejection fraction was in the  range of 40% to 45%. The study was not technically sufficient to  allow evaluation of LV diastolic dysfunction due to atrial  fibrillation. - Regional wall motion abnormality: Akinesis of the basal  inferoseptal, basal-mid inferior, and mid inferolateral  myocardium; severe hypokinesis of the mid inferoseptal  myocardium; moderate hypokinesis of the mid anteroseptal and  basal anterolateral myocardium; mild hypokinesis of the mid  anterior and mid anterolateral myocardium. - Aortic valve: Morphologically, there was at least mild aortic  stenosis. Moderately calcified annulus. Mildly thickened,  moderately calcified leaflets. There was trivial regurgitation. - Mitral valve: Calcified annulus. Normal thickness leaflets .  There was moderate  regurgitation. - Left atrium: The atrium was moderately to severely dilated. - Right ventricle: Systolic function was moderately to severely  reduced. - Right atrium: The atrium was mildly to moderately dilated. - Atrial septum: No defect or patent foramen ovale was identified. - Tricuspid valve: There was moderate regurgitation. - Pulmonic valve: There was mild regurgitation. - Pulmonary arteries: Systolic pressure was severely increased. PA  peak pressure: 63 mm Hg (S). - Inferior vena cava: Estimated CVP 15 mmHg.  Assessment and Plan:  1. 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 He reports no worsening leg edema and has had stable weights on current dose of Lasix.  2. Mild to moderate aortic stenosis, stable by echocardiogram from last year.  3. Chronic atrial fibrillation, continue strategy of heart rate control and anticoagulation. Coumadin followed through the anticoagulation clinic.  4. Multivessel CAD status post CABG, no active angina symptoms. He continues on statin therapy.  Current medicines were reviewed with the patient today.  Disposition: Follow-up in 6 months.  Signed, [name], MD, [facility] 11/23/2016 2:04 PM    [place] [facility] [address] [place], [place] [address] Phone: [phone]; Fax: [phone]

## 2016-12-14 ENCOUNTER — Ambulatory Visit (INDEPENDENT_AMBULATORY_CARE_PROVIDER_SITE_OTHER): Payer: Medicare Other | Admitting: *Deleted

## 2016-12-14 DIAGNOSIS — Z5181 Encounter for therapeutic drug level monitoring: Secondary | ICD-10-CM

## 2016-12-14 DIAGNOSIS — I480 Paroxysmal atrial fibrillation: Secondary | ICD-10-CM | POA: Diagnosis not present

## 2016-12-14 LAB — POCT INR: INR: 2.4

## 2016-12-22 ENCOUNTER — Other Ambulatory Visit: Payer: Self-pay | Admitting: Cardiology

## 2016-12-28 ENCOUNTER — Ambulatory Visit (HOSPITAL_COMMUNITY): Payer: Medicare Other | Admitting: Hematology & Oncology

## 2016-12-28 ENCOUNTER — Other Ambulatory Visit (HOSPITAL_COMMUNITY): Payer: Medicare Other

## 2016-12-29 ENCOUNTER — Ambulatory Visit (HOSPITAL_COMMUNITY): Payer: Medicare Other | Admitting: Hematology

## 2016-12-29 ENCOUNTER — Other Ambulatory Visit (HOSPITAL_COMMUNITY): Payer: Medicare Other

## 2017-01-11 ENCOUNTER — Ambulatory Visit (INDEPENDENT_AMBULATORY_CARE_PROVIDER_SITE_OTHER): Payer: Medicare Other | Admitting: *Deleted

## 2017-01-11 DIAGNOSIS — I480 Paroxysmal atrial fibrillation: Secondary | ICD-10-CM | POA: Diagnosis not present

## 2017-01-11 DIAGNOSIS — Z5181 Encounter for therapeutic drug level monitoring: Secondary | ICD-10-CM

## 2017-01-11 LAB — POCT INR: INR: 2.2

## 2017-02-03 ENCOUNTER — Encounter (HOSPITAL_COMMUNITY): Payer: Self-pay | Admitting: Cardiology

## 2017-02-03 ENCOUNTER — Emergency Department (HOSPITAL_COMMUNITY)
Admission: EM | Admit: 2017-02-03 | Discharge: 2017-02-03 | Disposition: A | Discharge status: Home / Self Care | Payer: Medicare Other | Attending: Emergency Medicine | Admitting: Emergency Medicine

## 2017-02-03 ENCOUNTER — Emergency Department (HOSPITAL_COMMUNITY): Payer: Medicare Other

## 2017-02-03 DIAGNOSIS — I130 Hypertensive heart and chronic kidney disease with heart failure and stage 1 through stage 4 chronic kidney disease, or unspecified chronic kidney disease: Secondary | ICD-10-CM | POA: Insufficient documentation

## 2017-02-03 DIAGNOSIS — R5383 Other fatigue: Secondary | ICD-10-CM | POA: Diagnosis present

## 2017-02-03 DIAGNOSIS — I252 Old myocardial infarction: Secondary | ICD-10-CM | POA: Insufficient documentation

## 2017-02-03 DIAGNOSIS — Z79899 Other long term (current) drug therapy: Secondary | ICD-10-CM | POA: Insufficient documentation

## 2017-02-03 DIAGNOSIS — I5042 Chronic combined systolic (congestive) and diastolic (congestive) heart failure: Secondary | ICD-10-CM | POA: Insufficient documentation

## 2017-02-03 DIAGNOSIS — I2510 Atherosclerotic heart disease of native coronary artery without angina pectoris: Secondary | ICD-10-CM | POA: Insufficient documentation

## 2017-02-03 DIAGNOSIS — R51 Headache: Secondary | ICD-10-CM | POA: Insufficient documentation

## 2017-02-03 DIAGNOSIS — N183 Chronic kidney disease, stage 3 (moderate): Secondary | ICD-10-CM | POA: Diagnosis not present

## 2017-02-03 DIAGNOSIS — Z7901 Long term (current) use of anticoagulants: Secondary | ICD-10-CM | POA: Insufficient documentation

## 2017-02-03 DIAGNOSIS — R519 Headache, unspecified: Secondary | ICD-10-CM

## 2017-02-03 DIAGNOSIS — E039 Hypothyroidism, unspecified: Secondary | ICD-10-CM | POA: Insufficient documentation

## 2017-02-03 DIAGNOSIS — J449 Chronic obstructive pulmonary disease, unspecified: Secondary | ICD-10-CM | POA: Insufficient documentation

## 2017-02-03 DIAGNOSIS — Z87891 Personal history of nicotine dependence: Secondary | ICD-10-CM | POA: Diagnosis not present

## 2017-02-03 DIAGNOSIS — Z951 Presence of aortocoronary bypass graft: Secondary | ICD-10-CM | POA: Insufficient documentation

## 2017-02-03 HISTORY — DX: Unspecified osteoarthritis, unspecified site: M19.90

## 2017-02-03 LAB — URINALYSIS, ROUTINE W REFLEX MICROSCOPIC
BACTERIA UA: NONE SEEN
Bilirubin Urine: NEGATIVE
GLUCOSE, UA: NEGATIVE mg/dL
HGB URINE DIPSTICK: NEGATIVE
KETONES UR: NEGATIVE mg/dL
Leukocytes, UA: NEGATIVE
NITRITE: NEGATIVE
PROTEIN: 30 mg/dL — AB
Specific Gravity, Urine: 1.019 (ref 1.005–1.030)
Squamous Epithelial / LPF: NONE SEEN
pH: 5 (ref 5.0–8.0)

## 2017-02-03 LAB — CBC WITH DIFFERENTIAL/PLATELET
BASOS ABS: 0 10*3/uL (ref 0.0–0.1)
BASOS PCT: 0 %
Eosinophils Absolute: 0 10*3/uL (ref 0.0–0.7)
Eosinophils Relative: 1 %
HEMATOCRIT: 41.1 % (ref 39.0–52.0)
HEMOGLOBIN: 13.5 g/dL (ref 13.0–17.0)
Lymphocytes Relative: 20 %
Lymphs Abs: 0.6 10*3/uL — ABNORMAL LOW (ref 0.7–4.0)
MCH: 30.7 pg (ref 26.0–34.0)
MCHC: 32.8 g/dL (ref 30.0–36.0)
MCV: 93.4 fL (ref 78.0–100.0)
Monocytes Absolute: 0.4 10*3/uL (ref 0.1–1.0)
Monocytes Relative: 15 %
NEUTROS ABS: 1.8 10*3/uL (ref 1.7–7.7)
NEUTROS PCT: 63 %
Platelets: 35 10*3/uL — ABNORMAL LOW (ref 150–400)
RBC: 4.4 MIL/uL (ref 4.22–5.81)
RDW: 17.3 % — ABNORMAL HIGH (ref 11.5–15.5)
WBC: 2.8 10*3/uL — ABNORMAL LOW (ref 4.0–10.5)

## 2017-02-03 LAB — COMPREHENSIVE METABOLIC PANEL
ALBUMIN: 3.8 g/dL (ref 3.5–5.0)
ALK PHOS: 90 U/L (ref 38–126)
ALT: 14 U/L — ABNORMAL LOW (ref 17–63)
AST: 21 U/L (ref 15–41)
Anion gap: 6 (ref 5–15)
BILIRUBIN TOTAL: 1.3 mg/dL — AB (ref 0.3–1.2)
BUN: 18 mg/dL (ref 6–20)
CALCIUM: 8.4 mg/dL — AB (ref 8.9–10.3)
CO2: 27 mmol/L (ref 22–32)
Chloride: 103 mmol/L (ref 101–111)
Creatinine, Ser: 1.24 mg/dL (ref 0.61–1.24)
GFR calc Af Amer: >60 mL/min (ref >60)
GFR calc non Af Amer: 54 mL/min — ABNORMAL LOW (ref >60)
GLUCOSE: 90 mg/dL (ref 65–99)
POTASSIUM: 4.6 mmol/L (ref 3.5–5.1)
SODIUM: 136 mmol/L (ref 135–145)
TOTAL PROTEIN: 6.4 g/dL — AB (ref 6.5–8.1)

## 2017-02-03 LAB — PROTIME-INR
INR: 1.84
Prothrombin Time: 21.5 seconds — ABNORMAL HIGH (ref 11.4–15.2)

## 2017-02-03 LAB — BRAIN NATRIURETIC PEPTIDE: B Natriuretic Peptide: 863 pg/mL — ABNORMAL HIGH (ref 0.0–100.0)

## 2017-02-03 LAB — TROPONIN I

## 2017-02-03 MED ORDER — FUROSEMIDE 40 MG PO TABS
20.0000 mg | ORAL_TABLET | Freq: Once | ORAL | Status: AC
  Administered 2017-02-03: 20 mg via ORAL
  Filled 2017-02-03: qty 1

## 2017-02-03 MED ORDER — HYDROCODONE-ACETAMINOPHEN 5-325 MG PO TABS
1.0000 | ORAL_TABLET | Freq: Once | ORAL | Status: AC
  Administered 2017-02-03: 1 via ORAL
  Filled 2017-02-03: qty 1

## 2017-02-03 NOTE — ED Triage Notes (Signed)
Headache  since this morning.   States he is having a hard time ambulating today.

## 2017-02-03 NOTE — ED Notes (Signed)
Patient transported to X-ray 

## 2017-02-03 NOTE — Discharge Instructions (Signed)
Follow-up with her doctor next week. Increase her Lasix to 40 mg a day or 2 pills a day since they are 20 mg tablets. Do that for 3 days. Then go back to 1 pill a day. Return to emergency department if necessary

## 2017-02-03 NOTE — ED Provider Notes (Signed)
[facility] DEPT Provider Note   CSN: [id number] Arrival date & time: 02/03/17  1704     History   Chief Complaint Chief Complaint  Patient presents with  . Weakness    HPI Cory Ryan is a 76 y.o. male.  Patient complains of a headache and his son says that he seems be little weak today.   The history is provided by the patient. No language interpreter was used.  Headache   This is a new problem. The current episode started 12 to 24 hours ago. The problem occurs every few minutes. The problem has been gradually improving. The headache is associated with nothing. The quality of the pain is described as dull. The pain is at a severity of 3/10. The pain is mild. The pain does not radiate.    Past Medical History:  Diagnosis Date  . Anemia   . Anxiety   . Aortic stenosis    Moderate  . Arthritis   . Atrial fibrillation ([place])   . Chronic systolic heart failure (HCC)    LVEF 40-45%  . CKD (chronic kidney disease) stage 3, GFR 30-59 ml/min   . Colon cancer ([place])   . Colon polyps 12/2005   Per colonoscopy  . COPD (chronic obstructive pulmonary disease) ([place])   . Coronary atherosclerosis of native coronary artery    Multivessel status post CABG  . Emphysema   . Gastric erosions 12/2005   Per EGD - Dr. [name]  . GERD (gastroesophageal reflux disease)   . Hypothyroidism   . Ischemic cardiomyopathy   . Mitral regurgitation    Moderate  . Myocardial infarction [facility])    Remote  . Psoriasis   . Pulmonary nodules     Patient Active Problem List   Diagnosis Date Noted  . COPD mixed type ([place]) 08/11/2015  . Atrial fibrillation with rapid ventricular response ([place])   . Thrombocytopenia ([place]) 08/10/2015  . Chronic atrial fibrillation ([place]) 08/08/2015  . Bronchospasm 10/05/2014  . Acute on chronic combined systolic and diastolic congestive heart failure ([place]) 10/03/2014  . Encounter for therapeutic drug monitoring 12/27/2013  . History of colon cancer   . Colon cancer  ([place]) 11/21/2013  . Coronary atherosclerosis of native coronary artery 06/15/2013  . Aortic stenosis 06/15/2013  . Mitral regurgitation 06/15/2013  . CKD (chronic kidney disease) stage 3, GFR 30-59 ml/min 06/15/2013  . Long term current use of anticoagulant therapy 12/20/2012  . Systolic CHF, chronic ([place]) 02/06/2012    Past Surgical History:  Procedure Laterality Date  . COLECTOMY N/A 12/13/2013   Procedure: TOTAL COLECTOMY;  Surgeon: [name], MD;  Location: [facility] ORS;  Service: General;  Laterality: N/A;  . CORONARY ARTERY BYPASS GRAFT  March 2004   LIMA to LAD, SVG to diagonal, SVG to OM 2, SVG to RCA  . FLEXIBLE SIGMOIDOSCOPY  10/25/2012   Procedure: FLEXIBLE SIGMOIDOSCOPY;  Surgeon: [name], MD;  Location: [facility] ENDO SUITE;  Service: Gastroenterology;  Laterality: N/A;  . PARTIAL COLECTOMY  10/26/2012   Procedure: PARTIAL COLECTOMY;  Surgeon: [name], MD;  Location: [facility] ORS;  Service: General;  Laterality: N/A;  Closure of Colovesical Fistula       Home Medications    Prior to Admission medications   Medication Sig Start Date End Date Taking? Authorizing Provider  albuterol (PROVENTIL) (2.5 MG/3ML) 0.083% nebulizer solution Inhale 3 mLs into the lungs every 4 (four) hours as needed for wheezing or shortness of breath.  08/06/15  Yes Historical  Provider, MD  carvedilol (COREG) 6.25 MG tablet Take 1 tablet (6.25 mg total) by mouth 2 (two) times daily with a meal. 08/06/16  Yes [name], MD  diltiazem (CARDIZEM CD) 120 MG 24 hr capsule TAKE (1) CAPSULE BY MOUTH TWICE DAILY. 08/20/16  Yes [name], MD  furosemide (LASIX) 20 MG tablet Take 1 tablet (20 mg total) by mouth daily. 09/02/16  Yes [name], MD  Guaifenesin [facility] MAXIMUM STRENGTH) 1200 MG TB12 Take 1 tablet by mouth daily as needed (for congestion).   Yes Historical Provider, MD  HYDROcodone-acetaminophen (NORCO) 5-325 MG per tablet Take 1-2 tablets by mouth every 4 (four) hours as  needed. Patient taking differently: Take 1 tablet by mouth every 6 (six) hours as needed for moderate pain.  12/18/13  Yes Aviva Signs, MD  Ipratropium-Albuterol (COMBIVENT RESPIMAT) 20-100 MCG/ACT AERS respimat Inhale 1 puff into the lungs as needed. Patient taking differently: Inhale 1 puff into the lungs every 6 (six) hours as needed for wheezing or shortness of breath.  06/15/13  Yes [name], MD  loperamide (IMODIUM) 2 MG capsule Take 1 capsule (2 mg total) by mouth as needed for diarrhea or loose stools. Patient taking differently: Take 2-4 mg by mouth as needed for diarrhea or loose stools.  10/29/12  Yes Chelsea Primus, MD  potassium chloride SA (K-DUR,KLOR-CON) 20 MEQ tablet Take 20 mEq by mouth 2 (two) times daily.  02/08/12  Yes Rexene Alberts, MD  warfarin (COUMADIN) 5 MG tablet Take 1/2 tablet daily except 1 tablet on Tuesdays and Fridays Patient taking differently: Take 2.5-5 mg by mouth See admin instructions. Take 1/2 tablet daily except 1 tablet on Tuesdays and Fridays 12/22/16  Yes [name], MD    Family History Family History  Problem Relation Age of Onset  . Cancer Brother   . Heart Problems Mother     unknown  . Hypertension Other     Social History Social History  Substance Use Topics  . Smoking status: Former Smoker    Packs/day: 1.00    Years: 30.00    Types: Cigarettes    Quit date: 03/17/2003  . Smokeless tobacco: Never Used  . Alcohol use No     Comment: no alcohol use since '04     Allergies   Patient has no known allergies.   Review of Systems Review of Systems  Constitutional: Positive for fatigue. Negative for appetite change.  HENT: Negative for congestion, ear discharge and sinus pressure.   Eyes: Negative for discharge.  Respiratory: Negative for cough.   Cardiovascular: Negative for chest pain.  Gastrointestinal: Negative for abdominal pain and diarrhea.  Genitourinary: Negative for frequency and hematuria.  Musculoskeletal:  Negative for back pain.  Skin: Negative for rash.  Neurological: Positive for headaches. Negative for seizures.  Psychiatric/Behavioral: Negative for hallucinations.     Physical Exam Updated Vital Signs BP 136/86   Pulse 88   Temp 98.7 F (37.1 C) (Oral)   Resp 17   Ht 5\' 10"  (1.778 m)   Wt 165 lb (74.8 kg)   SpO2 (!) 88%   BMI 23.68 kg/m   Physical Exam  Constitutional: He is oriented to person, place, and time. He appears well-developed.  HENT:  Head: Normocephalic.  Eyes: Conjunctivae and EOM are normal. No scleral icterus.  Neck: Neck supple. No thyromegaly present.  Cardiovascular: Normal rate and regular rhythm.  Exam reveals no gallop and no friction rub.   No murmur heard.  Pulmonary/Chest: No stridor. He has no wheezes. He has no rales. He exhibits no tenderness.  Abdominal: He exhibits no distension. There is no tenderness. There is no rebound.  Musculoskeletal: Normal range of motion. He exhibits no edema.  Lymphadenopathy:    He has no cervical adenopathy.  Neurological: He is oriented to person, place, and time. He exhibits normal muscle tone. Coordination normal.  Skin: No rash noted. No erythema.  Psychiatric: He has a normal mood and affect. His behavior is normal.     ED Treatments / Results  Labs (all labs ordered are listed, but only abnormal results are displayed) Labs Reviewed  CBC WITH DIFFERENTIAL/PLATELET - Abnormal; Notable for the following:       Result Value   WBC 2.8 (*)    RDW 17.3 (*)    Platelets 35 (*)    Lymphs Abs 0.6 (*)    All other components within normal limits  COMPREHENSIVE METABOLIC PANEL - Abnormal; Notable for the following:    Calcium 8.4 (*)    Total Protein 6.4 (*)    ALT 14 (*)    Total Bilirubin 1.3 (*)    GFR calc non Af Amer 54 (*)    All other components within normal limits  PROTIME-INR - Abnormal; Notable for the following:    Prothrombin Time 21.5 (*)    All other components within normal limits    URINALYSIS, ROUTINE W REFLEX MICROSCOPIC - Abnormal; Notable for the following:    Protein, ur 30 (*)    All other components within normal limits  BRAIN NATRIURETIC PEPTIDE - Abnormal; Notable for the following:    B Natriuretic Peptide 863.0 (*)    All other components within normal limits  TROPONIN I    EKG  EKG Interpretation  Date/Time:  Wednesday February 03 2017 17:34:12 EDT Ventricular Rate:  87 PR Interval:    QRS Duration: 115 QT Interval:  359 QTC Calculation: 432 R Axis:   -28 Text Interpretation:  Atrial fibrillation Incomplete left bundle branch block LVH with secondary repolarization abnormality Confirmed by Cara Thaxton  MD, Terrell Ostrand [phone]) on 02/03/2017 10:08:19 PM       Radiology Dg Chest 2 View  Result Date: 02/03/2017 CLINICAL DATA:  Headache beginning last night. Chronic shortness of breath and mild cough EXAM: CHEST  2 VIEW COMPARISON:  Two-view chest x-ray 10/29/2015 FINDINGS: The heart size is normal. Mild edema is present. Bilateral pleural effusions are evident, right greater than left. There is associated basilar airspace disease. The lungs are hyperexpanded. Patient is status post median sternotomy for CABG. Atherosclerotic changes are noted at the aortic arch the visualized soft tissues and bony thorax are otherwise unremarkable. IMPRESSION: 1. Mild edema and bilateral effusions, right greater than left. Findings are compatible with congestive heart failure. 2. Bibasilar airspace disease likely reflects atelectasis. Lobar pneumonia is not excluded. Electronically Signed   By: San Morelle M.D.   On: 02/03/2017 18:53   Ct Head Wo Contrast  Result Date: 02/03/2017 CLINICAL DATA:  Headaches for several hours EXAM: CT HEAD WITHOUT CONTRAST TECHNIQUE: Contiguous axial images were obtained from the base of the skull through the vertex without intravenous contrast. COMPARISON:  02/06/2012 FINDINGS: Brain: Mild atrophic changes are noted. Scattered areas of  decreased attenuation are noted consistent with chronic white matter ischemic change. The overall appearance is stable from the prior exam. No findings to suggest acute hemorrhage, acute infarction or space-occupying mass lesion are noted. Vascular: No hyperdense vessel or unexpected calcification. Skull:  Somewhat limited due to patient motion artifact. No acute bony abnormality is noted. Sinuses/Orbits: Opacification of the right maxillary antrum is seen which may be chronic in nature. Other: None IMPRESSION: Chronic changes without acute abnormality. Electronically Signed   By: Inez Catalina M.D.   On: 02/03/2017 19:11    Procedures Procedures (including critical care time)  Medications Ordered in ED Medications  furosemide (LASIX) tablet 20 mg (not administered)  HYDROcodone-acetaminophen (NORCO/VICODIN) 5-325 MG per tablet 1 tablet (1 tablet Oral Given 02/03/17 1803)     Initial Impression / Assessment and Plan / ED Course  I have reviewed the triage vital signs and the nursing notes.  Pertinent labs & imaging results that were available during my care of the patient were reviewed by me and considered in my medical decision making (see chart for details).     3. Head normal. Patient's headache improved in emergency department. Labs and chest x-ray do show some congestive heart failure. Patient has a history of chronic congestive heart failure. Patient ambulated through the hospital and stated that he felt fine and wanted to be discharged. I instructed the patient increase his Lasix to 40 mg a day. He takes 20 mg a day but did not take his dose today. He will follow-up with his PCP  Final Clinical Impressions(s) / ED Diagnoses   Final diagnoses:  Headache around the eyes  Chronic combined systolic and diastolic CHF (congestive heart failure) ([place])    New Prescriptions New Prescriptions   No medications on file     Milton Ferguson, MD 02/03/17 2230

## 2017-02-08 ENCOUNTER — Telehealth: Payer: Self-pay | Admitting: *Deleted

## 2017-02-08 NOTE — Telephone Encounter (Signed)
Would like to know if he can have his coumadin checked tomorrow since he's coming in to see [facility]

## 2017-02-08 NOTE — Telephone Encounter (Signed)
That is fine.  Put him on the nurses schedule.

## 2017-02-09 ENCOUNTER — Encounter: Payer: Self-pay | Admitting: Adult Health

## 2017-02-09 ENCOUNTER — Other Ambulatory Visit (HOSPITAL_COMMUNITY)
Admission: RE | Admit: 2017-02-09 | Discharge: 2017-02-09 | Disposition: A | Discharge status: Home / Self Care | Payer: Medicare Other | Source: Ambulatory Visit | Attending: Adult Health | Admitting: Adult Health

## 2017-02-09 ENCOUNTER — Ambulatory Visit (INDEPENDENT_AMBULATORY_CARE_PROVIDER_SITE_OTHER): Payer: Medicare Other | Admitting: Adult Health

## 2017-02-09 ENCOUNTER — Ambulatory Visit (INDEPENDENT_AMBULATORY_CARE_PROVIDER_SITE_OTHER): Payer: Medicare Other | Admitting: *Deleted

## 2017-02-09 VITALS — BP 126/66 | HR 58 | Ht 70.0 in | Wt 152.8 lb

## 2017-02-09 DIAGNOSIS — D696 Thrombocytopenia, unspecified: Secondary | ICD-10-CM | POA: Diagnosis not present

## 2017-02-09 DIAGNOSIS — I5022 Chronic systolic (congestive) heart failure: Secondary | ICD-10-CM | POA: Insufficient documentation

## 2017-02-09 DIAGNOSIS — I482 Chronic atrial fibrillation, unspecified: Secondary | ICD-10-CM

## 2017-02-09 DIAGNOSIS — Z5181 Encounter for therapeutic drug level monitoring: Secondary | ICD-10-CM

## 2017-02-09 DIAGNOSIS — I480 Paroxysmal atrial fibrillation: Secondary | ICD-10-CM | POA: Diagnosis not present

## 2017-02-09 LAB — CBC WITH DIFFERENTIAL/PLATELET
BASOS ABS: 0 10*3/uL (ref 0.0–0.1)
Basophils Relative: 1 %
EOS ABS: 0 10*3/uL (ref 0.0–0.7)
Eosinophils Relative: 1 %
HEMATOCRIT: 44.5 % (ref 39.0–52.0)
Hemoglobin: 14.5 g/dL (ref 13.0–17.0)
Lymphocytes Relative: 27 %
Lymphs Abs: 0.8 10*3/uL (ref 0.7–4.0)
MCH: 30.3 pg (ref 26.0–34.0)
MCHC: 32.6 g/dL (ref 30.0–36.0)
MCV: 93.1 fL (ref 78.0–100.0)
MONO ABS: 0.3 10*3/uL (ref 0.1–1.0)
Monocytes Relative: 10 %
NEUTROS ABS: 1.8 10*3/uL (ref 1.7–7.7)
NEUTROS PCT: 62 %
Platelets: 30 10*3/uL — ABNORMAL LOW (ref 150–400)
RBC: 4.78 MIL/uL (ref 4.22–5.81)
RDW: 16.8 % — AB (ref 11.5–15.5)
WBC: 2.9 10*3/uL — ABNORMAL LOW (ref 4.0–10.5)

## 2017-02-09 LAB — BASIC METABOLIC PANEL
ANION GAP: 6 (ref 5–15)
BUN: 17 mg/dL (ref 6–20)
CALCIUM: 8.9 mg/dL (ref 8.9–10.3)
CO2: 26 mmol/L (ref 22–32)
CREATININE: 1.19 mg/dL (ref 0.61–1.24)
Chloride: 107 mmol/L (ref 101–111)
GFR, EST NON AFRICAN AMERICAN: 57 mL/min — AB (ref >60)
Glucose, Bld: 103 mg/dL — ABNORMAL HIGH (ref 65–99)
Potassium: 5.5 mmol/L — ABNORMAL HIGH (ref 3.5–5.1)
SODIUM: 139 mmol/L (ref 135–145)

## 2017-02-09 LAB — PROTIME-INR
INR: 2.32
Prothrombin Time: 25.9 seconds — ABNORMAL HIGH (ref 11.4–15.2)

## 2017-02-09 LAB — BRAIN NATRIURETIC PEPTIDE: B Natriuretic Peptide: 880 pg/mL — ABNORMAL HIGH (ref 0.0–100.0)

## 2017-02-09 NOTE — Progress Notes (Signed)
Cardiology Office Note   Date:  02/09/2017   ID:  Manual, [place] [birth year], MRN [id number]  PCP:  Purvis Kilts, MD  Cardiologist: Domenic Polite, MD    No chief complaint on file.   History of Present Illness: Cory Ryan is a 76 y.o. male who presents for ongoing assessment and management of atrial fibrillation, on Coumadin therapy, history of chronic systolic heart failure, aortic stenosis, CAD, (multivessel status post CABG), ischemic cardiomyopathy. He was last seen by [name] on 11/23/2016 and was clinically stable without any evidence of fluid overload or weight gain. No medication changes were made. He is here for 6 month follow-up.      Unfortunately, the patient was seen in the emergency room on 02/03/2017 for complaints of a headache. Labs and chest x-ray did show congestive heart failure. The patient was instructed to increase his Lasix to 40 mg a day, increased from 20 mg a day.    He has seen his PCP on the day after his ER appointment. Lab were drawn again and he was told his potassium was too low. Lasix was stopped, diltiazem was decreased to 120 mg daily. He states that he has been having diarrhea stools, dark brown, not black.  He was found to have thrombocytopenia on ER labs with Platelets of 35. He was referred back to hematology.     He has no complaints today with the exception of mild dizziness. He denies chest pain or dyspnea. He is in a wheelchair. Son who is with him states that he has not eaten very much over the last few days.   Past Medical History:  Diagnosis Date  . Anemia   . Anxiety   . Aortic stenosis    Moderate  . Arthritis   . Atrial fibrillation ([place])   . Chronic systolic heart failure (HCC)    LVEF 40-45%  . CKD (chronic kidney disease) stage 3, GFR 30-59 ml/min   . Colon cancer ([place])   . Colon polyps 12/2005   Per colonoscopy  . COPD (chronic obstructive pulmonary disease) ([place])   . Coronary atherosclerosis of native coronary artery     Multivessel status post CABG  . Emphysema   . Gastric erosions 12/2005   Per EGD - Dr. [name]  . GERD (gastroesophageal reflux disease)   . Hypothyroidism   . Ischemic cardiomyopathy   . Mitral regurgitation    Moderate  . Myocardial infarction [facility])    Remote  . Psoriasis   . Pulmonary nodules     Past Surgical History:  Procedure Laterality Date  . COLECTOMY N/A 12/13/2013   Procedure: TOTAL COLECTOMY;  Surgeon: [name], MD;  Location: [facility] ORS;  Service: General;  Laterality: N/A;  . CORONARY ARTERY BYPASS GRAFT  March 2004   LIMA to LAD, SVG to diagonal, SVG to OM 2, SVG to RCA  . FLEXIBLE SIGMOIDOSCOPY  10/25/2012   Procedure: FLEXIBLE SIGMOIDOSCOPY;  Surgeon: [name], MD;  Location: [facility] ENDO SUITE;  Service: Gastroenterology;  Laterality: N/A;  . PARTIAL COLECTOMY  10/26/2012   Procedure: PARTIAL COLECTOMY;  Surgeon: [name], MD;  Location: [facility] ORS;  Service: General;  Laterality: N/A;  Closure of Colovesical Fistula     Current Outpatient Prescriptions  Medication Sig Dispense Refill  . albuterol (PROVENTIL) (2.5 MG/3ML) 0.083% nebulizer solution Inhale 3 mLs into the lungs every 4 (four) hours as needed for wheezing or shortness of breath.     . carvedilol (COREG)  6.25 MG tablet Take 1 tablet (6.25 mg total) by mouth 2 (two) times daily with a meal. 60 tablet 11  . diltiazem (CARDIZEM CD) 120 MG 24 hr capsule Take 120 mg by mouth daily.    . Guaifenesin (MUCINEX MAXIMUM STRENGTH) 1200 MG TB12 Take 1 tablet by mouth daily as needed (for congestion).    [name] HYDROcodone-acetaminophen (NORCO) 5-325 MG per tablet Take 1-2 tablets by mouth every 4 (four) hours as needed. (Patient taking differently: Take 1 tablet by mouth every 6 (six) hours as needed for moderate pain. ) 40 tablet 0  . Ipratropium-Albuterol (COMBIVENT RESPIMAT) 20-100 MCG/ACT AERS respimat Inhale 1 puff into the lungs as needed. (Patient taking differently: Inhale 1 puff into the lungs every 6 (six)  hours as needed for wheezing or shortness of breath. ) 1 Inhaler 1  . loperamide (IMODIUM) 2 MG capsule Take 1 capsule (2 mg total) by mouth as needed for diarrhea or loose stools. (Patient taking differently: Take 2-4 mg by mouth as needed for diarrhea or loose stools. ) 20 capsule 2  . potassium chloride SA (K-DUR,KLOR-CON) 20 MEQ tablet Take 20 mEq by mouth 2 (two) times daily.     [name] warfarin (COUMADIN) 5 MG tablet Take 1/2 tablet daily except 1 tablet on Tuesdays and Fridays (Patient taking differently: Take 2.5-5 mg by mouth See admin instructions. Take 1/2 tablet daily except 1 tablet on Tuesdays and Fridays) 30 tablet 3   No current facility-administered medications for this visit.     Allergies:   Patient has no known allergies.    Social History:  The patient  reports that he quit smoking about 13 years ago. His smoking use included Cigarettes. He has a 30.00 pack-year smoking history. He has never used smokeless tobacco. He reports that he does not drink alcohol or use drugs.   Family History:  The patient's family history includes Cancer in his brother; Heart Problems in his mother; Hypertension in his other.    ROS: All other systems are reviewed and negative. Unless otherwise mentioned in H&P    PHYSICAL EXAM: VS:  BP 126/66   Pulse (!) 58   Ht 5\' 10"  (1.778 m)   Wt 152 lb 12.8 oz (69.3 kg)   SpO2 92%   BMI 21.92 kg/m  , BMI Body mass index is 21.92 kg/m. GEN: Well nourished, well developed, in no acute distress  HEENT: normal  Ruddy complexion.  Neck: no JVD, carotid bruits, or masses Cardiac: IRRR; no murmurs, rubs, or gallops, non-pitting edema  Respiratory: Some mild crackles in the bases,  No wheezes.  GI: soft, nontender, nondistended, + BS MS: no deformity or atrophy  Skin: warm and dry, no rash Neuro:  Strength and sensation are intact Psych: euthymic mood, full affect   EKG: Atrial fibrillation, infero/septa;/lateral T-wave inversion, new from EKG on  February 03, 2017 without septal T-wave abnormalities, HR 77 bpm.  Recent Labs: 02/03/2017: ALT 14; B Natriuretic Peptide 863.0; BUN 18; Creatinine, Ser 1.24; Hemoglobin 13.5; Platelets 35; Potassium 4.6; Sodium 136    Lipid Panel    Component Value Date/Time   CHOL 145 08/09/2015 0438   TRIG 75 08/09/2015 0438   HDL 27 (L) 08/09/2015 0438   CHOLHDL 5.4 08/09/2015 0438   VLDL 15 08/09/2015 0438   LDLCALC 103 (H) 08/09/2015 0438      Wt Readings from Last 3 Encounters:  02/09/17 152 lb 12.8 oz (69.3 kg)  02/03/17 165 lb (74.8 kg)  11/23/16 172  lb (78 kg)    Other studies Reviewed: Echocardiogram 11/02/15: Study Conclusions  - Left ventricle: The cavity size was normal. Wall thickness was  increased in a pattern of mild LVH. Systolic function was mildly  to moderately reduced. The estimated ejection fraction was in the  range of 40% to 45%. The study was not technically sufficient to  allow evaluation of LV diastolic dysfunction due to atrial  fibrillation. - Regional wall motion abnormality: Akinesis of the basal  inferoseptal, basal-mid inferior, and mid inferolateral  myocardium; severe hypokinesis of the mid inferoseptal  myocardium; moderate hypokinesis of the mid anteroseptal and  basal anterolateral myocardium; mild hypokinesis of the mid  anterior and mid anterolateral myocardium. - Aortic valve: Morphologically, there was at least mild aortic  stenosis. Moderately calcified annulus. Mildly thickened,  moderately calcified leaflets. There was trivial regurgitation. - Mitral valve: Calcified annulus. Normal thickness leaflets .  There was moderate regurgitation. - Left atrium: The atrium was moderately to severely dilated. - Right ventricle: Systolic function was moderately to severely  reduced. - Right atrium: The atrium was mildly to moderately dilated. - Atrial septum: No defect or patent foramen ovale was identified. - Tricuspid valve: There was  moderate regurgitation. - Pulmonic valve: There was mild regurgitation. - Pulmonary arteries: Systolic pressure was severely increased. PA  peak pressure: 63 mm Hg (S). - Inferior vena cava: Estimated CVP 15 mmHg.  ASSESSMENT AND PLAN:  1. Chronic systolic CHF:  Evidence of this when in ER. He was placed on higher dose of lasix, but stopped when seen by PCP. He denies chest pain or dyspnea but he is very sedimentary.  Will repeat labs to assess kidney function and BNP. He may be able to restart lasix 20 mg again once I see where we stand. He will continue coreg, may go up on dose.   2. Atrial fib: INR will be drawn with labs. He is on coumadin and is seen in our coumadin clinic. Diltiazem was decreased to 120 mg daily. He denies melena. Has had some diarrhea which can be associated with high doses of diltiazem. With reduced EF , would not favor continuing diltiazem at higher doses.   3. Thrombocytopenia; Platelets are decreasing when comparing levels seen in December to levels in April. He was referred to hematology. They are awaiting appointment.      Current medicines are reviewed at length with the patient today.    Labs/ tests ordered today include:   Orders Placed This Encounter  Procedures  . CBC with Differential  . Basic Metabolic Panel (BMET)  . B Nat Peptide  . INR/PT  . EKG 12-Lead     Disposition:   FU with one week.   Signed, Jory Sims, NP  02/09/2017 3:36 PM    [facility] [address], [place], [place] [address] Phone: [phone]; Fax: [phone]

## 2017-02-09 NOTE — Patient Instructions (Signed)
Your physician recommends that you schedule a follow-up appointment in: 1 Week   Your physician recommends that you continue on your current medications as directed. Please refer to the Current Medication list given to you today.  Your physician recommends that you return for lab work in: Today  If you need a refill on your cardiac medications before your next appointment, please call your pharmacy.  Thank you for choosing [facility]!

## 2017-02-10 ENCOUNTER — Telehealth: Payer: Self-pay | Admitting: Adult Health

## 2017-02-10 ENCOUNTER — Telehealth: Payer: Self-pay | Admitting: *Deleted

## 2017-02-10 MED ORDER — FUROSEMIDE 20 MG PO TABS: 20.0000 mg | ORAL_TABLET | Freq: Every day | ORAL | 3 refills | Status: DC

## 2017-02-10 NOTE — Telephone Encounter (Signed)
Results of lab work / tg  °

## 2017-02-10 NOTE — Telephone Encounter (Signed)
Lab results given to son.

## 2017-02-10 NOTE — Telephone Encounter (Signed)
Returned pt call to let him know the doctor has not resulted his labs yet. There was no answer, not any voicemail set up.

## 2017-02-10 NOTE — Telephone Encounter (Signed)
-----   Message from Lendon Colonel, NP sent at 02/09/2017  4:35 PM EDT ----- Steward Ros about INR. She will call him for next appointment. No changes in coumadin dosing. His potassium is elevated but I do not see that he is on potassium replacement. He can start taking lasix 20 mg daily again. Still needs to see Hematology.

## 2017-02-11 ENCOUNTER — Encounter (HOSPITAL_COMMUNITY): Payer: Medicare Other | Attending: Oncology | Admitting: Oncology

## 2017-02-11 ENCOUNTER — Encounter (HOSPITAL_COMMUNITY): Payer: Self-pay

## 2017-02-11 VITALS — BP 129/63 | HR 63 | Temp 97.3°F | Resp 18 | Wt 151.0 lb

## 2017-02-11 DIAGNOSIS — Z87891 Personal history of nicotine dependence: Secondary | ICD-10-CM | POA: Insufficient documentation

## 2017-02-11 DIAGNOSIS — D72819 Decreased white blood cell count, unspecified: Secondary | ICD-10-CM | POA: Insufficient documentation

## 2017-02-11 DIAGNOSIS — I509 Heart failure, unspecified: Secondary | ICD-10-CM | POA: Insufficient documentation

## 2017-02-11 DIAGNOSIS — Z7901 Long term (current) use of anticoagulants: Secondary | ICD-10-CM | POA: Insufficient documentation

## 2017-02-11 DIAGNOSIS — D696 Thrombocytopenia, unspecified: Secondary | ICD-10-CM | POA: Diagnosis not present

## 2017-02-11 DIAGNOSIS — I4891 Unspecified atrial fibrillation: Secondary | ICD-10-CM | POA: Insufficient documentation

## 2017-02-11 DIAGNOSIS — I429 Cardiomyopathy, unspecified: Secondary | ICD-10-CM | POA: Insufficient documentation

## 2017-02-11 NOTE — Patient Instructions (Addendum)
[place] at [facility] Discharge Instructions  RECOMMENDATIONS MADE BY THE CONSULTANT AND ANY TEST RESULTS WILL BE SENT TO YOUR REFERRING PHYSICIAN.  You were seen today by Dr. Twana First Stat IR consult for bone marrow biopsy Labs on next visit RTC in 3 weeks    Thank you for choosing [place] at [facility] to provide your oncology and hematology care.  To afford each patient quality time with our provider, please arrive at least 15 minutes before your scheduled appointment time.    If you have a lab appointment with the [facility] please come in thru the  Main Entrance and check in at the main information desk  You need to re-schedule your appointment should you arrive 10 or more minutes late.  We strive to give you quality time with our providers, and arriving late affects you and other patients whose appointments are after yours.  Also, if you no show three or more times for appointments you may be dismissed from the clinic at the providers discretion.     Again, thank you for choosing [facility].  Our hope is that these requests will decrease the amount of time that you wait before being seen by our physicians.       _____________________________________________________________  Should you have questions after your visit to [facility], please contact our office at [phone] between the hours of 8:30 a.m. and 4:30 p.m.  Voicemails left after 4:30 p.m. will not be returned until the following business day.  For prescription refill requests, have your pharmacy contact our office.       Resources For Cancer Patients and their Caregivers ? American Cancer Society: Can assist with transportation, wigs, general needs, runs Look Good Feel Better.        952-218-8602 ? Cancer Care: Provides financial assistance, online support groups, medication/co-pay assistance.  1-800-813-HOPE (607)619-4145) ? [employer] Assists [place] cancer patients and their families through emotional , educational and financial support.  805-729-9363 ? [place] DSS Where to apply for food stamps, Medicaid and utility assistance. 878-638-7940 ? [employer]: Transportation to medical appointments. (774)195-0813 ? Social Security Administration: May apply for disability if have a Stage IV cancer. 684-456-8155 571-084-2371 ? [employer], Disability and Transit Services: Assists with nutrition, care and transit needs. [place] Support Programs: '@10RELATIVEDAYS'$ @ > Cancer Support Group  2nd Tuesday of the month 1pm-2pm, Journey Room  > Creative Journey  3rd Tuesday of the month 1130am-1pm, Journey Room  > Look Good Feel Better  1st Wednesday of the month 10am-12 noon, Journey Room (Call [place] to register [phone])

## 2017-02-11 NOTE — Progress Notes (Signed)
PROGRESS NOTE  Cory Ryan OB: Feb 24, 1941  MR#: [id number]  MEQ#:[id number]  Patient Care Team: [name], MD as PCP - General (Family Medicine) [name], MD as Consulting Physician (Cardiology)  Progress Note  CHIEF COMPLAINT/ INTERVAL HISTORY: 76 year old patient came to hematology oncology clinic for evaluation regarding thrombocytopenia. Last documented platelet count was in June of 2017 was 76,000.  Patient also has slightly elevated white count. Reviewing the records patient has thrombocytopenia documented for last several years and has not significantly changed. Patient does not have any history of bleeding. Patient has a history of cardiomyopathy, congestive heart failure on chronic Coumadin therapy for atrial fibrillation Last INR was 2.0.  He is accompanied with his son. Patient presents in a wheelchair. I have personally reviewed the labs and images with the patient. His lab results from 02/09/2017 demonstrate platelets have dropped to 30K. Previous plt count from 4 months ago was 64k. His WBC has also dropped to 2.9K K/uL and it was 4.7k 4 months ago. His son notes that he having headaches, dizziness, and nausea that started a week ago, which have since resolved. Patient reports that his headaches have improved but still happen intermittently. He also notes he currently has right ear pain. Patient denies having right ear drainage.  A referral is being made to ENT by his PCP. His son also states that he is very weak suddenly. "He was walking with his cane and doing everything about a week ago."  Patient states he bleeds and bruises easily. Currently on Coumadin.   He denies any bleeding including hematochezia, melena, hematuria, and, hemoptysis    REVIEW OF SYSTEMS:   Review of Systems  HENT: Positive for ear pain (right ear). Negative for ear discharge.   Eyes: Negative.   Respiratory: Negative.  Negative for hemoptysis.   Gastrointestinal: Positive for  abdominal pain (mild tenderness). Negative for blood in stool.  Genitourinary: Negative.  Negative for hematuria.  Musculoskeletal: Negative.   Skin: Negative.   Neurological: Positive for weakness and headaches.  Endo/Heme/Allergies: Bruises/bleeds easily.  Psychiatric/Behavioral: Negative.   All other systems reviewed and are negative.   As per HPI. Otherwise, a complete review of systems is negatve.  PAST MEDICAL HISTORY: Past Medical History:  Diagnosis Date  . Anemia   . Anxiety   . Aortic stenosis    Moderate  . Arthritis   . Atrial fibrillation ([place])   . Chronic systolic heart failure (HCC)    LVEF 40-45%  . CKD (chronic kidney disease) stage 3, GFR 30-59 ml/min   . Colon cancer ([place])   . Colon polyps 12/2005   Per colonoscopy  . COPD (chronic obstructive pulmonary disease) ([place])   . Coronary atherosclerosis of native coronary artery    Multivessel status post CABG  . Emphysema   . Gastric erosions 12/2005   Per EGD - Dr. [name]  . GERD (gastroesophageal reflux disease)   . Hypothyroidism   . Ischemic cardiomyopathy   . Mitral regurgitation    Moderate  . Myocardial infarction [facility])    Remote  . Psoriasis   . Pulmonary nodules     PAST SURGICAL HISTORY: Past Surgical History:  Procedure Laterality Date  . COLECTOMY N/A 12/13/2013   Procedure: TOTAL COLECTOMY;  Surgeon: [name], MD;  Location: [facility] ORS;  Service: General;  Laterality: N/A;  . CORONARY ARTERY BYPASS GRAFT  March 2004   LIMA to LAD, SVG to diagonal, SVG to OM 2, SVG to RCA  .  FLEXIBLE SIGMOIDOSCOPY  10/25/2012   Procedure: FLEXIBLE SIGMOIDOSCOPY;  Surgeon: [name], MD;  Location: [facility] ENDO SUITE;  Service: Gastroenterology;  Laterality: N/A;  . PARTIAL COLECTOMY  10/26/2012   Procedure: PARTIAL COLECTOMY;  Surgeon: [name], MD;  Location: [facility] ORS;  Service: General;  Laterality: N/A;  Closure of Colovesical Fistula    FAMILY HISTORY Family History  Problem Relation Age of Onset    . Cancer Brother   . Heart Problems Mother     unknown  . Hypertension Other      ADVANCED DIRECTIVES: Patient does not have any living will or healthcare power of attorney.  Information was given .  Available resources had been discussed.  We will follow-up on subsequent appointments regarding this issue   HEALTH MAINTENANCE: Social History  Substance Use Topics  . Smoking status: Former Smoker    Packs/day: 1.00    Years: 30.00    Types: Cigarettes    Quit date: 03/17/2003  . Smokeless tobacco: Never Used  . Alcohol use No     Comment: no alcohol use since '04     Colonoscopy: 10/25/2012  PAP: N/A  Bone density: Never  Lipid panel: 08/09/2015  No Known Allergies  Current Outpatient Prescriptions  Medication Sig Dispense Refill  . albuterol (PROVENTIL) (2.5 MG/3ML) 0.083% nebulizer solution Inhale 3 mLs into the lungs every 4 (four) hours as needed for wheezing or shortness of breath.     . carvedilol (COREG) 6.25 MG tablet Take 1 tablet (6.25 mg total) by mouth 2 (two) times daily with a meal. 60 tablet 11  . diltiazem (CARDIZEM CD) 120 MG 24 hr capsule Take 120 mg by mouth daily.    . furosemide (LASIX) 20 MG tablet Take 1 tablet (20 mg total) by mouth daily. 90 tablet 3  . Guaifenesin (MUCINEX MAXIMUM STRENGTH) 1200 MG TB12 Take 1 tablet by mouth daily as needed (for congestion).    [name] HYDROcodone-acetaminophen (NORCO) 5-325 MG per tablet Take 1-2 tablets by mouth every 4 (four) hours as needed. (Patient taking differently: Take 1 tablet by mouth every 6 (six) hours as needed for moderate pain. ) 40 tablet 0  . Ipratropium-Albuterol (COMBIVENT RESPIMAT) 20-100 MCG/ACT AERS respimat Inhale 1 puff into the lungs as needed. (Patient taking differently: Inhale 1 puff into the lungs every 6 (six) hours as needed for wheezing or shortness of breath. ) 1 Inhaler 1  . loperamide (IMODIUM) 2 MG capsule Take 1 capsule (2 mg total) by mouth as needed for diarrhea or loose stools.  (Patient taking differently: Take 2-4 mg by mouth as needed for diarrhea or loose stools. ) 20 capsule 2  . potassium chloride SA (K-DUR,KLOR-CON) 20 MEQ tablet Take 20 mEq by mouth 2 (two) times daily.     [name] warfarin (COUMADIN) 5 MG tablet Take 1/2 tablet daily except 1 tablet on Tuesdays and Fridays (Patient taking differently: Take 2.5-5 mg by mouth See admin instructions. Take 1/2 tablet daily except 1 tablet on Tuesdays and Fridays) 30 tablet 3   No current facility-administered medications for this visit.     Vitals:   02/11/17 1059  BP: 129/63  Pulse: 63  Resp: 18  Temp: 97.3 F (36.3 C)   Filed Weights   02/11/17 1059  Weight: 151 lb (68.5 kg)      OBJECTIVE: Physical Exam  Constitutional: He is oriented to person, place, and time and well-developed, well-nourished, and in no distress. No distress.  HENT:  Head: Normocephalic  and atraumatic.  Mouth/Throat: No oropharyngeal exudate.  Eyes: Conjunctivae and EOM are normal. Pupils are equal, round, and reactive to light. No scleral icterus.  Neck: Normal range of motion. Neck supple. No JVD present.  Cardiovascular: Normal rate, regular rhythm and normal heart sounds.  Exam reveals no gallop and no friction rub.   No murmur heard. Pulmonary/Chest: Effort normal and breath sounds normal. No respiratory distress. He has no wheezes. He has no rales.  Abdominal: Soft. Bowel sounds are normal. He exhibits no distension. There is no tenderness. There is no guarding.  Musculoskeletal: Normal range of motion. He exhibits no edema or tenderness.  Lymphadenopathy:    He has no cervical adenopathy.  Neurological: He is alert and oriented to person, place, and time. No cranial nerve deficit.  Skin: Skin is warm and dry. No rash noted. No erythema. No pallor.  Psychiatric: Affect and judgment normal.  Nursing note and vitals reviewed.     ECOG FS:2 - Symptomatic, <50% confined to bed  LAB RESULTS:  Hospital Outpatient Visit on  02/09/2017  Component Date Value Ref Range Status  . WBC 02/09/2017 2.9* 4.0 - 10.5 K/uL Final  . RBC 02/09/2017 4.78  4.22 - 5.81 MIL/uL Final  . Hemoglobin 02/09/2017 14.5  13.0 - 17.0 g/dL Final  . HCT 02/09/2017 44.5  39.0 - 52.0 % Final  . MCV 02/09/2017 93.1  78.0 - 100.0 fL Final  . MCH 02/09/2017 30.3  26.0 - 34.0 pg Final  . MCHC 02/09/2017 32.6  30.0 - 36.0 g/dL Final  . RDW 02/09/2017 16.8* 11.5 - 15.5 % Final  . Platelets 02/09/2017 30* 150 - 400 K/uL Final   Comment: PLATELET COUNT CONFIRMED BY SMEAR SPECIMEN CHECKED FOR CLOTS   . Neutrophils Relative % 02/09/2017 62  % Final  . Neutro Abs 02/09/2017 1.8  1.7 - 7.7 K/uL Final  . Lymphocytes Relative 02/09/2017 27  % Final  . Lymphs Abs 02/09/2017 0.8  0.7 - 4.0 K/uL Final  . Monocytes Relative 02/09/2017 10  % Final  . Monocytes Absolute 02/09/2017 0.3  0.1 - 1.0 K/uL Final  . Eosinophils Relative 02/09/2017 1  % Final  . Eosinophils Absolute 02/09/2017 0.0  0.0 - 0.7 K/uL Final  . Basophils Relative 02/09/2017 1  % Final  . Basophils Absolute 02/09/2017 0.0  0.0 - 0.1 K/uL Final  . Sodium 02/09/2017 139  135 - 145 mmol/L Final  . Potassium 02/09/2017 5.5* 3.5 - 5.1 mmol/L Final  . Chloride 02/09/2017 107  101 - 111 mmol/L Final  . CO2 02/09/2017 26  22 - 32 mmol/L Final  . Glucose, Bld 02/09/2017 103* 65 - 99 mg/dL Final  . BUN 02/09/2017 17  6 - 20 mg/dL Final  . Creatinine, Ser 02/09/2017 1.19  0.61 - 1.24 mg/dL Final  . Calcium 02/09/2017 8.9  8.9 - 10.3 mg/dL Final  . GFR calc non Af Amer 02/09/2017 57* >60 mL/min Final  . GFR calc Af Amer 02/09/2017 >60  >60 mL/min Final   Comment: (NOTE) The eGFR has been calculated using the CKD EPI equation. This calculation has not been validated in all clinical situations. eGFR's persistently <60 mL/min signify possible Chronic Kidney Disease.   . Anion gap 02/09/2017 6  5 - 15 Final  . B Natriuretic Peptide 02/09/2017 880.0* 0.0 - 100.0 pg/mL Final  . Prothrombin  Time 02/09/2017 25.9* 11.4 - 15.2 seconds Final  . INR 02/09/2017 2.32   Final   CBC Latest Ref Rng &  Units 02/09/2017 02/03/2017 09/28/2016  WBC 4.0 - 10.5 K/uL 2.9(L) 2.8(L) 4.7  Hemoglobin 13.0 - 17.0 g/dL 14.5 13.5 13.4  Hematocrit 39.0 - 52.0 % 44.5 41.1 42.4  Platelets 150 - 400 K/uL 30(L) 35(L) 64(L)     RADIOLOGY: I have reviewed the images below and agree with the reported results  CHEST  2 VIEW 02/03/2017  IMPRESSION: 1. Mild edema and bilateral effusions, right greater than left. Findings are compatible with congestive heart failure. 2. Bibasilar airspace disease likely reflects atelectasis. Lobar pneumonia is not excluded.  CT HEAD WITHOUT CONTRAST 02/03/2017  IMPRESSION: Chronic changes without acute abnormality.   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 Results noted above.  I am concerned that patient has had a decline in his platelets when it was otherwise stable before.  He also has new onset leukopenia. Due to his bicytopenia, I have recommended for patient to get a bone marrow biopsy to rule out any new underlying bone marrow pathology such as MDS. He is willing to proceed. I will get it scheduled stat. RTC in 3 weeks to review bone marrow results and to discuss next plan of care. Labs on next visit.  Orders Placed This Encounter  Procedures  . 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    Answer:   [facility]    Order Specific Question:   Radiology Contrast Protocol - do NOT remove file path    Answer:   [email or web address]  . Comprehensive metabolic panel    Standing Status:   Future    Standing Expiration Date:   02/11/2018  . CBC with Differential    Standing Status:   Future    Standing Expiration Date:   02/11/2018  . Lactate dehydrogenase    Standing Status:   Future    Standing Expiration Date:   02/11/2018  . Vitamin B12    Standing Status:   Future    Standing Expiration Date:   02/11/2018  . Folate    Standing Status:   Future    Standing Expiration Date:   02/11/2018  . Ferritin    Standing Status:   Future    Standing Expiration Date:   02/11/2018  . Iron and TIBC    Standing Status:   Future    Standing Expiration Date:   02/11/2018      Patient expressed understanding and was in agreement with this plan. He also understands that He can call clinic at any time with any questions, concerns, or complaints. He was instructed on signs and symptoms of concern such as nose bleeding, gum bleeding, unusual bruising.   This document serves as a record of services personally performed by  Twana First, MD. It was created on her behalf by Shirlean Mylar, a trained medical scribe. The creation of this record is based on the scribe's personal observations and the provider's statements to them. This document has been checked and approved by the attending provider.  I have reviewed the above documentation for accuracy and completeness and I agree with the above.  Cory Ryan   02/11/2017 11:21 AM

## 2017-02-16 ENCOUNTER — Encounter: Payer: Self-pay | Admitting: Adult Health

## 2017-02-16 ENCOUNTER — Ambulatory Visit (INDEPENDENT_AMBULATORY_CARE_PROVIDER_SITE_OTHER): Payer: Medicare Other | Admitting: Adult Health

## 2017-02-16 VITALS — BP 116/60 | HR 77 | Ht 70.0 in | Wt 152.0 lb

## 2017-02-16 DIAGNOSIS — I5022 Chronic systolic (congestive) heart failure: Secondary | ICD-10-CM

## 2017-02-16 DIAGNOSIS — D696 Thrombocytopenia, unspecified: Secondary | ICD-10-CM | POA: Diagnosis not present

## 2017-02-16 DIAGNOSIS — I482 Chronic atrial fibrillation, unspecified: Secondary | ICD-10-CM

## 2017-02-16 MED ORDER — POTASSIUM CHLORIDE CRYS ER 20 MEQ PO TBCR: 10.0000 meq | EXTENDED_RELEASE_TABLET | Freq: Every day | ORAL | 3 refills | Status: AC

## 2017-02-16 NOTE — Progress Notes (Signed)
Cardiology Office Note   Date:  02/16/2017   ID:  Cory, Ryan 09-10-1941, MRN [id number]  PCP:  Assunta Found, MD  Cardiologist:  [place]/ Joni Reining, NP   Chief Complaint  Patient presents with  . Atrial Fibrillation  . Congestive Heart Failure    History of Present Illness: Cory Ryan is a 76 y.o. male who presents for ongoing assessment and management of atrial fibrillation, on Coumadin therapy, chronic systolic heart failure, aortic valve stenosis, multivessel CAD status post CABG, ischemic cardiomyopathy. He was last seen in the office on 02/09/2017 after ER visit where he had complaints of headache and was found to have hypokalemia. His Lasix was stopped, diltiazem was decreased to 120 mg daily, and he was also found to have thrombocytopenia and referred to hematology with a platelet level of 35.  On last office visit on 02/09/2017 the patient was without complaints with exception of some mild dizziness. Headache and chest pain had subsided. Follow-up BMET was ordered to assess kidney function, to ascertain whether Lasix daily. Since he did. INR was drawn with labs with phone call to Vashti Hey, RN, Coumadin clinic nurse for dosing.  He is here today, feeling better, no complaints of dizziness, fatigue, or chest pain. His son who is with him states he is due to have a bone marrow biopsy at [facility] next week due to thrombocytopenia and leukopenia They are aware that he is on Coumadin at [facility], I have told them that they do not need to hold Coumadin for bone marrow aspiration.  Past Medical History:  Diagnosis Date  . Anemia   . Anxiety   . Aortic stenosis    Moderate  . Arthritis   . Atrial fibrillation (HCC)   . Chronic systolic heart failure (HCC)    LVEF 40-45%  . CKD (chronic kidney disease) stage 3, GFR 30-59 ml/min   . Colon cancer (HCC)   . Colon polyps 12/2005   Per colonoscopy  . COPD (chronic obstructive pulmonary disease) (HCC)   . Coronary  atherosclerosis of native coronary artery    Multivessel status post CABG  . Emphysema   . Gastric erosions 12/2005   Per EGD - Dr. Jena Gauss  . GERD (gastroesophageal reflux disease)   . Hypothyroidism   . Ischemic cardiomyopathy   . Mitral regurgitation    Moderate  . Myocardial infarction [facility])    Remote  . Psoriasis   . Pulmonary nodules     Past Surgical History:  Procedure Laterality Date  . COLECTOMY N/A 12/13/2013   Procedure: TOTAL COLECTOMY;  Surgeon: Dalia Heading, MD;  Location: [facility] ORS;  Service: General;  Laterality: N/A;  . CORONARY ARTERY BYPASS GRAFT  March 2004   LIMA to LAD, SVG to diagonal, SVG to OM 2, SVG to RCA  . FLEXIBLE SIGMOIDOSCOPY  10/25/2012   Procedure: FLEXIBLE SIGMOIDOSCOPY;  Surgeon: Dalia Heading, MD;  Location: [facility] ENDO SUITE;  Service: Gastroenterology;  Laterality: N/A;  . PARTIAL COLECTOMY  10/26/2012   Procedure: PARTIAL COLECTOMY;  Surgeon: Dalia Heading, MD;  Location: [facility] ORS;  Service: General;  Laterality: N/A;  Closure of Colovesical Fistula     Current Outpatient Prescriptions  Medication Sig Dispense Refill  . albuterol (PROVENTIL) (2.5 MG/3ML) 0.083% nebulizer solution Inhale 3 mLs into the lungs every 4 (four) hours as needed for wheezing or shortness of breath.     . carvedilol (COREG) 6.25 MG tablet Take 1 tablet (6.25 mg total) by  mouth 2 (two) times daily with a meal. 60 tablet 11  . diltiazem (CARDIZEM CD) 120 MG 24 hr capsule Take 120 mg by mouth daily.    . furosemide (LASIX) 20 MG tablet Take 1 tablet (20 mg total) by mouth daily. 90 tablet 3  . Guaifenesin (MUCINEX MAXIMUM STRENGTH) 1200 MG TB12 Take 1 tablet by mouth daily as needed (for congestion).    [name] HYDROcodone-acetaminophen (NORCO) 5-325 MG per tablet Take 1-2 tablets by mouth every 4 (four) hours as needed. (Patient taking differently: Take 1 tablet by mouth every 6 (six) hours as needed for moderate pain. ) 40 tablet 0  . Ipratropium-Albuterol (COMBIVENT RESPIMAT) 20-100  MCG/ACT AERS respimat Inhale 1 puff into the lungs as needed. (Patient taking differently: Inhale 1 puff into the lungs every 6 (six) hours as needed for wheezing or shortness of breath. ) 1 Inhaler 1  . loperamide (IMODIUM) 2 MG capsule Take 1 capsule (2 mg total) by mouth as needed for diarrhea or loose stools. (Patient taking differently: Take 2-4 mg by mouth as needed for diarrhea or loose stools. ) 20 capsule 2  . warfarin (COUMADIN) 5 MG tablet Take 1/2 tablet daily except 1 tablet on Tuesdays and Fridays (Patient taking differently: Take 2.5-5 mg by mouth See admin instructions. Take 1/2 tablet daily except 1 tablet on Tuesdays and Fridays) 30 tablet 3  . potassium chloride SA (K-DUR,KLOR-CON) 20 MEQ tablet Take 0.5 tablets (10 mEq total) by mouth daily. 90 tablet 3   No current facility-administered medications for this visit.     Allergies:   Patient has no known allergies.    Social History:  The patient  reports that he quit smoking about 13 years ago. His smoking use included Cigarettes. He has a 30.00 pack-year smoking history. He has never used smokeless tobacco. He reports that he does not drink alcohol or use drugs.   Family History:  The patient's family history includes Cancer in his brother; Heart Problems in his mother; Hypertension in his other.    ROS: All other systems are reviewed and negative. Unless otherwise mentioned in H&P    PHYSICAL EXAM: VS:  BP 116/60   Pulse 77   Ht '5\' 10"'$  (1.778 m)   Wt 152 lb (68.9 kg)   SpO2 92%   BMI 21.81 kg/m  , BMI Body mass index is 21.81 kg/m. GEN: Well nourished, well developed, in no acute distress  HEENT: normal  Neck: no JVD, carotid bruits, or masses Cardiac: IRRR; no murmurs, rubs, or gallops,no edema  Respiratory:  clear to auscultation bilaterally, normal work of breathing GI: soft, nontender, nondistended, + BS MS: no deformity or atrophy uses cane for ambulation Skin: warm and dry, no rash Neuro:  Strength and  sensation are intact Psych: euthymic mood, full affect   Recent Labs: 02/03/2017: ALT 14 02/09/2017: B Natriuretic Peptide 880.0; BUN 17; Creatinine, Ser 1.19; Hemoglobin 14.5; Platelets 30; Potassium 5.5; Sodium 139    Lipid Panel    Component Value Date/Time   CHOL 145 08/09/2015 0438   TRIG 75 08/09/2015 0438   HDL 27 (L) 08/09/2015 0438   CHOLHDL 5.4 08/09/2015 0438   VLDL 15 08/09/2015 0438   LDLCALC 103 (H) 08/09/2015 0438      Wt Readings from Last 3 Encounters:  02/16/17 152 lb (68.9 kg)  02/11/17 151 lb (68.5 kg)  02/09/17 152 lb 12.8 oz (69.3 kg)      Other studies Reviewed:  Echocardiogram 10/23/2015:  -  Left ventricle: The cavity size was normal. Wall thickness was  increased in a pattern of mild LVH. Systolic function was mildly  to moderately reduced. The estimated ejection fraction was in the  range of 40% to 45%. The study was not technically sufficient to  allow evaluation of LV diastolic dysfunction due to atrial  fibrillation. - Regional wall motion abnormality: Akinesis of the basal  inferoseptal, basal-mid inferior, and mid inferolateral  myocardium; severe hypokinesis of the mid inferoseptal  myocardium; moderate hypokinesis of the mid anteroseptal and  basal anterolateral myocardium; mild hypokinesis of the mid  anterior and mid anterolateral myocardium. - Aortic valve: Morphologically, there was at least mild aortic  stenosis. Moderately calcified annulus. Mildly thickened,  moderately calcified leaflets. There was trivial regurgitation. - Mitral valve: Calcified annulus. Normal thickness leaflets .  There was moderate regurgitation. - Left atrium: The atrium was moderately to severely dilated. - Right ventricle: Systolic function was moderately to severely  reduced. - Right atrium: The atrium was mildly to moderately dilated. - Atrial septum: No defect or patent foramen ovale was identified. - Tricuspid valve: There was  moderate regurgitation. - Pulmonic valve: There was mild regurgitation. - Pulmonary arteries: Systolic pressure was severely increased. PA  peak pressure: 63 mm Hg (S). - Inferior vena cava: Estimated CVP 15 mmHg.  ASSESSMENT AND PLAN:  1. Atrial fibrillation: He remains rate controlled on carvedilol 6.25 mg twice a day and diltiazem 120 mg daily. There is no evidence of fluid overload or CHF despite systolic dysfunction. He remains on Lasix 20 mg daily. He will continue on Coumadin therapy and is being followed in our Coumadin clinic in [place]. He will restart potassium 10 mEq daily as his potassium had been stopped due to hyperkalemia (mild 5.5 on last labs).   2. Chronic systolic heart failure: As stated above he does have decreased LV function at 40-45%. There is no evidence of fluid overload or decompensation at this time. Careful monitoring for this on diltiazem.  3. Thrombocytopenia with leukopenia: Is scheduled for a bone marrow biopsy. The patient is artery had an appointment with oncology with reviewed his medications, and they were told not to stop Coumadin. We'll defer to oncology concerning this prior to bone marrow biopsy. I have explained to him that he may have more bruising than normal, secondary to his anticoagulation therapy.  Current medicines are reviewed at length with the patient today.    Labs/ tests ordered today include: Follow-up with Coumadin clinic for labs  No orders of the defined types were placed in this encounter.    Disposition:   FU with  3 months.   Signed, '@Mid'$  February and now@  02/16/2017 3:48 PM    [facility] [address] [place], [place] [address] Phone: [phone]; Fax: [phone]

## 2017-02-16 NOTE — Patient Instructions (Addendum)
Medication Instructions:  START POTASSIUM 20 MEQ ( TAKE 1/2 TABLET DAILY )   Labwork: NONE  Testing/Procedures: NONE  Follow-Up: Your physician recommends that you schedule a follow-up appointment in: 3 MONTHS   Any Other Special Instructions Will Be Listed Below (If Applicable).  KEEP APPOINTMENT WITH Sherill Mangen REID, RN TO CHECK INR   If you need a refill on your cardiac medications before your next appointment, please call your pharmacy.

## 2017-02-28 ENCOUNTER — Other Ambulatory Visit: Payer: Self-pay | Admitting: Radiology

## 2017-03-02 ENCOUNTER — Ambulatory Visit (HOSPITAL_COMMUNITY)
Admission: RE | Admit: 2017-03-02 | Discharge: 2017-03-02 | Disposition: A | Discharge status: Home / Self Care | Payer: Medicare Other | Source: Ambulatory Visit | Attending: Oncology | Admitting: Oncology

## 2017-03-02 ENCOUNTER — Other Ambulatory Visit (HOSPITAL_COMMUNITY): Payer: Self-pay | Admitting: Oncology

## 2017-03-02 ENCOUNTER — Encounter (HOSPITAL_COMMUNITY): Payer: Self-pay

## 2017-03-02 DIAGNOSIS — R918 Other nonspecific abnormal finding of lung field: Secondary | ICD-10-CM | POA: Diagnosis not present

## 2017-03-02 DIAGNOSIS — I5022 Chronic systolic (congestive) heart failure: Secondary | ICD-10-CM | POA: Insufficient documentation

## 2017-03-02 DIAGNOSIS — I255 Ischemic cardiomyopathy: Secondary | ICD-10-CM | POA: Diagnosis not present

## 2017-03-02 DIAGNOSIS — I252 Old myocardial infarction: Secondary | ICD-10-CM | POA: Insufficient documentation

## 2017-03-02 DIAGNOSIS — Z87891 Personal history of nicotine dependence: Secondary | ICD-10-CM | POA: Diagnosis not present

## 2017-03-02 DIAGNOSIS — Z8249 Family history of ischemic heart disease and other diseases of the circulatory system: Secondary | ICD-10-CM | POA: Insufficient documentation

## 2017-03-02 DIAGNOSIS — Z9889 Other specified postprocedural states: Secondary | ICD-10-CM | POA: Insufficient documentation

## 2017-03-02 DIAGNOSIS — I350 Nonrheumatic aortic (valve) stenosis: Secondary | ICD-10-CM | POA: Diagnosis not present

## 2017-03-02 DIAGNOSIS — F419 Anxiety disorder, unspecified: Secondary | ICD-10-CM | POA: Diagnosis not present

## 2017-03-02 DIAGNOSIS — D696 Thrombocytopenia, unspecified: Secondary | ICD-10-CM | POA: Diagnosis not present

## 2017-03-02 DIAGNOSIS — I2510 Atherosclerotic heart disease of native coronary artery without angina pectoris: Secondary | ICD-10-CM | POA: Diagnosis not present

## 2017-03-02 DIAGNOSIS — E039 Hypothyroidism, unspecified: Secondary | ICD-10-CM | POA: Diagnosis not present

## 2017-03-02 DIAGNOSIS — J449 Chronic obstructive pulmonary disease, unspecified: Secondary | ICD-10-CM | POA: Diagnosis not present

## 2017-03-02 DIAGNOSIS — N183 Chronic kidney disease, stage 3 (moderate): Secondary | ICD-10-CM | POA: Insufficient documentation

## 2017-03-02 DIAGNOSIS — D72819 Decreased white blood cell count, unspecified: Secondary | ICD-10-CM

## 2017-03-02 DIAGNOSIS — Z951 Presence of aortocoronary bypass graft: Secondary | ICD-10-CM | POA: Diagnosis not present

## 2017-03-02 DIAGNOSIS — Z79899 Other long term (current) drug therapy: Secondary | ICD-10-CM | POA: Diagnosis not present

## 2017-03-02 DIAGNOSIS — Z7901 Long term (current) use of anticoagulants: Secondary | ICD-10-CM | POA: Diagnosis not present

## 2017-03-02 DIAGNOSIS — K219 Gastro-esophageal reflux disease without esophagitis: Secondary | ICD-10-CM | POA: Diagnosis not present

## 2017-03-02 DIAGNOSIS — Z86010 Personal history of colonic polyps: Secondary | ICD-10-CM | POA: Insufficient documentation

## 2017-03-02 DIAGNOSIS — Z809 Family history of malignant neoplasm, unspecified: Secondary | ICD-10-CM | POA: Diagnosis not present

## 2017-03-02 DIAGNOSIS — I340 Nonrheumatic mitral (valve) insufficiency: Secondary | ICD-10-CM | POA: Insufficient documentation

## 2017-03-02 DIAGNOSIS — I4891 Unspecified atrial fibrillation: Secondary | ICD-10-CM | POA: Diagnosis not present

## 2017-03-02 DIAGNOSIS — Z85038 Personal history of other malignant neoplasm of large intestine: Secondary | ICD-10-CM | POA: Diagnosis not present

## 2017-03-02 LAB — BASIC METABOLIC PANEL
ANION GAP: 8 (ref 5–15)
BUN: 13 mg/dL (ref 6–20)
CALCIUM: 8.9 mg/dL (ref 8.9–10.3)
CO2: 26 mmol/L (ref 22–32)
Chloride: 106 mmol/L (ref 101–111)
Creatinine, Ser: 1.22 mg/dL (ref 0.61–1.24)
GFR calc Af Amer: >60 mL/min (ref >60)
GFR calc non Af Amer: 55 mL/min — ABNORMAL LOW (ref >60)
Glucose, Bld: 106 mg/dL — ABNORMAL HIGH (ref 65–99)
POTASSIUM: 4.2 mmol/L (ref 3.5–5.1)
Sodium: 140 mmol/L (ref 135–145)

## 2017-03-02 LAB — CBC WITH DIFFERENTIAL/PLATELET
BASOS PCT: 1 %
Basophils Absolute: 0 10*3/uL (ref 0.0–0.1)
Eosinophils Absolute: 0.1 10*3/uL (ref 0.0–0.7)
Eosinophils Relative: 3 %
HEMATOCRIT: 43 % (ref 39.0–52.0)
Hemoglobin: 14 g/dL (ref 13.0–17.0)
Lymphocytes Relative: 20 %
Lymphs Abs: 0.9 10*3/uL (ref 0.7–4.0)
MCH: 30.4 pg (ref 26.0–34.0)
MCHC: 32.6 g/dL (ref 30.0–36.0)
MCV: 93.3 fL (ref 78.0–100.0)
MONOS PCT: 10 %
Monocytes Absolute: 0.4 10*3/uL (ref 0.1–1.0)
NEUTROS ABS: 2.8 10*3/uL (ref 1.7–7.7)
NEUTROS PCT: 66 %
Platelets: 68 10*3/uL — ABNORMAL LOW (ref 150–400)
RBC: 4.61 MIL/uL (ref 4.22–5.81)
RDW: 16.2 % — ABNORMAL HIGH (ref 11.5–15.5)
WBC: 4.2 10*3/uL (ref 4.0–10.5)

## 2017-03-02 LAB — PROTIME-INR
INR: 1.57
Prothrombin Time: 19 seconds — ABNORMAL HIGH (ref 11.4–15.2)

## 2017-03-02 MED ORDER — DILTIAZEM HCL 25 MG/5ML IV SOLN
20.0000 mg | Freq: Once | INTRAVENOUS | Status: DC
  Filled 2017-03-02 (×2): qty 5

## 2017-03-02 MED ORDER — FENTANYL CITRATE (PF) 100 MCG/2ML IJ SOLN
INTRAMUSCULAR | Status: AC | PRN
  Administered 2017-03-02 (×2): 50 ug via INTRAVENOUS

## 2017-03-02 MED ORDER — MIDAZOLAM HCL 2 MG/2ML IJ SOLN
INTRAMUSCULAR | Status: AC
  Filled 2017-03-02: qty 6

## 2017-03-02 MED ORDER — HYDROCODONE-ACETAMINOPHEN 5-325 MG PO TABS: 1.0000 | ORAL_TABLET | ORAL | Status: DC | PRN

## 2017-03-02 MED ORDER — MIDAZOLAM HCL 2 MG/2ML IJ SOLN
INTRAMUSCULAR | Status: AC | PRN
  Administered 2017-03-02 (×2): 1 mg via INTRAVENOUS

## 2017-03-02 MED ORDER — SODIUM CHLORIDE 0.9 % IV SOLN
INTRAVENOUS | Status: DC
  Administered 2017-03-02: 10:00:00 via INTRAVENOUS

## 2017-03-02 MED ORDER — FENTANYL CITRATE (PF) 100 MCG/2ML IJ SOLN
INTRAMUSCULAR | Status: AC
  Filled 2017-03-02: qty 4

## 2017-03-02 NOTE — Procedures (Signed)
Interventional Radiology Procedure Note  Procedure: CT guided aspirate and core biopsy of right iliac bone Complications: None Recommendations: - Bedrest supine x 1 hrs - Hydrocodone PRN  Pain - Follow biopsy results  Signed,  Heath K. McCullough, MD   

## 2017-03-02 NOTE — Discharge Instructions (Signed)
Moderate Conscious Sedation, Adult, Care After °These instructions provide you with information about caring for yourself after your procedure. Your health care provider may also give you more specific instructions. Your treatment has been planned according to current medical practices, but problems sometimes occur. Call your health care provider if you have any problems or questions after your procedure. °What can I expect after the procedure? °After your procedure, it is common: °· To feel sleepy for several hours. °· To feel clumsy and have poor balance for several hours. °· To have poor judgment for several hours. °· To vomit if you eat too soon. °Follow these instructions at home: °For at least 24 hours after the procedure:  ° °· Do not: °¨ Participate in activities where you could fall or become injured. °¨ Drive. °¨ Use heavy machinery. °¨ Drink alcohol. °¨ Take sleeping pills or medicines that cause drowsiness. °¨ Make important decisions or sign legal documents. °¨ Take care of children on your own. °· Rest. °Eating and drinking  °· Follow the diet recommended by your health care provider. °· If you vomit: °¨ Drink water, juice, or soup when you can drink without vomiting. °¨ Make sure you have little or no nausea before eating solid foods. °General instructions  °· Have a responsible adult stay with you until you are awake and alert. °· Take over-the-counter and prescription medicines only as told by your health care provider. °· If you smoke, do not smoke without supervision. °· Keep all follow-up visits as told by your health care provider. This is important. °Contact a health care provider if: °· You keep feeling nauseous or you keep vomiting. °· You feel light-headed. °· You develop a rash. °· You have a fever. °Get help right away if: °· You have trouble breathing. °This information is not intended to replace advice given to you by your health care provider. Make sure you discuss any questions you  have with your health care provider. °Document Released: 07/19/2013 Document Revised: 03/02/2016 Document Reviewed: 01/18/2016 °Elsevier Interactive Patient Education © 2017 Elsevier Inc[employer] ° ° °Bone Marrow Aspiration and Bone Marrow Biopsy, Adult, Care After °This sheet gives you information about how to care for yourself after your procedure. Your health care provider may also give you more specific instructions. If you have problems or questions, contact your health care provider. °What can I expect after the procedure? °After the procedure, it is common to have: °· Mild pain and tenderness. °· Swelling. °· Bruising. °Follow these instructions at home: °· Take over-the-counter or prescription medicines only as told by your health care provider. °· Do not take baths, swim, or use a hot tub until your health care provider approves. Ask if you can take a shower or have a sponge bath. °· Follow instructions from your health care provider about how to take care of the puncture site. Make sure you: °¨ Wash your hands with soap and water before you change your bandage (dressing). If soap and water are not available, use hand sanitizer. °¨ Change your dressing as told by your health care provider. °· Check your puncture site every day for signs of infection. Check for: °¨ More redness, swelling, or pain. °¨ More fluid or blood. °¨ Warmth. °¨ Pus or a bad smell. °· Return to your normal activities as told by your health care provider. Ask your health care provider what activities are safe for you. °· Do not drive for 24 hours if you were given a medicine to help   you relax (sedative). °· Keep all follow-up visits as told by your health care provider. This is important. °Contact a health care provider if: °· You have more redness, swelling, or pain around the puncture site. °· You have more fluid or blood coming from the puncture site. °· Your puncture site feels warm to the touch. °· You have pus or a bad smell coming from  the puncture site. °· You have a fever. °· Your pain is not controlled with medicine. °This information is not intended to replace advice given to you by your health care provider. Make sure you discuss any questions you have with your health care provider. °Document Released: 04/17/2005 Document Revised: 04/17/2016 Document Reviewed: 03/11/2016 °Elsevier Interactive Patient Education © 2017 Elsevier Inc. ° °

## 2017-03-02 NOTE — H&P (Signed)
Chief Complaint: Patient was seen in consultation today for bone marrow biopsy at the request of Zhou,Louise  Referring Physician(s): Zhou,Louise  Supervising Physician: Jacqulynn Cadet  Patient Status: [facility] - Out-pt  History of Present Illness: Cory Ryan is a 76 y.o. male being worked up for leukopenia and thrombocytopenia. He is referred for bone marrow biopsy. PMHx, meds, labs, allergies reviewed. Has been NPO this am Did not stop his Coumadin as intsructed Son is at bedside. No c/o this am  Past Medical History:  Diagnosis Date  . Anemia   . Anxiety   . Aortic stenosis    Moderate  . Arthritis   . Atrial fibrillation ([place])   . Chronic systolic heart failure (HCC)    LVEF 40-45%  . CKD (chronic kidney disease) stage 3, GFR 30-59 ml/min   . Colon cancer ([place])   . Colon polyps 12/2005   Per colonoscopy  . COPD (chronic obstructive pulmonary disease) ([place])   . Coronary atherosclerosis of native coronary artery    Multivessel status post CABG  . Emphysema   . Gastric erosions 12/2005   Per EGD - Dr. [name]  . GERD (gastroesophageal reflux disease)   . Hypothyroidism   . Ischemic cardiomyopathy   . Mitral regurgitation    Moderate  . Myocardial infarction [facility])    Remote  . Psoriasis   . Pulmonary nodules     Past Surgical History:  Procedure Laterality Date  . COLECTOMY N/A 12/13/2013   Procedure: TOTAL COLECTOMY;  Surgeon: [name], MD;  Location: [facility] ORS;  Service: General;  Laterality: N/A;  . CORONARY ARTERY BYPASS GRAFT  March 2004   LIMA to LAD, SVG to diagonal, SVG to OM 2, SVG to RCA  . FLEXIBLE SIGMOIDOSCOPY  10/25/2012   Procedure: FLEXIBLE SIGMOIDOSCOPY;  Surgeon: [name], MD;  Location: [facility] ENDO SUITE;  Service: Gastroenterology;  Laterality: N/A;  . PARTIAL COLECTOMY  10/26/2012   Procedure: PARTIAL COLECTOMY;  Surgeon: [name], MD;  Location: [facility] ORS;  Service: General;  Laterality: N/A;  Closure of Colovesical Fistula     Allergies: Patient has no known allergies.  Medications: Prior to Admission medications   Medication Sig Start Date End Date Taking? Authorizing Provider  albuterol (PROVENTIL) (2.5 MG/3ML) 0.083% nebulizer solution Inhale 3 mLs into the lungs every 4 (four) hours as needed for wheezing or shortness of breath.  08/06/15  Yes [provider]  carvedilol (COREG) 6.25 MG tablet Take 1 tablet (6.25 mg total) by mouth 2 (two) times daily with a meal. 08/06/16  Yes [name], MD  diltiazem (CARDIZEM CD) 120 MG 24 hr capsule Take 120 mg by mouth daily.   Yes [provider]  furosemide (LASIX) 20 MG tablet Take 1 tablet (20 mg total) by mouth daily. 02/10/17 05/11/17 Yes Lendon Colonel, NP  Guaifenesin [facility] MAXIMUM STRENGTH) 1200 MG TB12 Take 1 tablet by mouth daily as needed (for congestion).   Yes [provider]  HYDROcodone-acetaminophen (NORCO) 5-325 MG per tablet Take 1-2 tablets by mouth every 4 (four) hours as needed. Patient taking differently: Take 1 tablet by mouth every 6 (six) hours as needed for moderate pain.  12/18/13  Yes Aviva Signs, MD  Ipratropium-Albuterol (COMBIVENT RESPIMAT) 20-100 MCG/ACT AERS respimat Inhale 1 puff into the lungs as needed. Patient taking differently: Inhale 1 puff into the lungs every 6 (six) hours as needed for wheezing or shortness of breath.  06/15/13  Yes Rozann Lesches  G, MD  loperamide (IMODIUM) 2 MG capsule Take 1 capsule (2 mg total) by mouth as needed for diarrhea or loose stools. Patient taking differently: Take 2-4 mg by mouth as needed for diarrhea or loose stools.  10/29/12  Yes Chelsea Primus, MD  potassium chloride SA (K-DUR,KLOR-CON) 20 MEQ tablet Take 0.5 tablets (10 mEq total) by mouth daily. 02/16/17  Yes Lendon Colonel, NP  warfarin (COUMADIN) 5 MG tablet Take 1/2 tablet daily except 1 tablet on Tuesdays and Fridays Patient taking differently: Take 2.5-5 mg by mouth See admin instructions.  Take 1/2 tablet daily except 1 tablet on Tuesdays and Fridays 12/22/16  Yes [name], MD     Family History  Problem Relation Age of Onset  . Cancer Brother   . Heart Problems Mother        unknown  . Hypertension Other     Social History   Social History  . Marital status: Widowed    Spouse name: N/A  . Number of children: N/A  . Years of education: N/A   Social History Main Topics  . Smoking status: Former Smoker    Packs/day: 1.00    Years: 30.00    Types: Cigarettes    Quit date: 03/17/2003  . Smokeless tobacco: Never Used  . Alcohol use No     Comment: no alcohol use since '04  . Drug use: No  . Sexual activity: Not Currently   Other Topics Concern  . None   Social History Narrative  . None     Review of Systems: A 12 point ROS discussed and pertinent positives are indicated in the HPI above.  All other systems are negative.  Review of Systems  Vital Signs: BP 137/86   Pulse 79   Temp 97.3 F (36.3 C) (Oral)   Resp 18   SpO2 97%   Physical Exam  Constitutional: He is oriented to person, place, and time. He appears well-developed. No distress.  HENT:  Head: Normocephalic.  Mouth/Throat: Oropharynx is clear and moist.  Neck: Normal range of motion. No JVD present. No tracheal deviation present.  Cardiovascular: Normal rate and normal heart sounds.   Irregular rhythm  Pulmonary/Chest: Effort normal and breath sounds normal. No respiratory distress.  Neurological: He is alert and oriented to person, place, and time.  Skin: Skin is warm and dry.  Psychiatric: He has a normal mood and affect. Judgment normal.    Mallampati Score:  MD Evaluation Airway: WNL Heart: WNL Abdomen: WNL Chest/ Lungs: WNL ASA  Classification: 3 Mallampati/Airway Score: Two  Imaging: Dg Chest 2 View  Result Date: 02/03/2017 CLINICAL DATA:  Headache beginning last night. Chronic shortness of breath and mild cough EXAM: CHEST  2 VIEW COMPARISON:  Two-view chest  x-ray 10/29/2015 FINDINGS: The heart size is normal. Mild edema is present. Bilateral pleural effusions are evident, right greater than left. There is associated basilar airspace disease. The lungs are hyperexpanded. Patient is status post median sternotomy for CABG. Atherosclerotic changes are noted at the aortic arch the visualized soft tissues and bony thorax are otherwise unremarkable. IMPRESSION: 1. Mild edema and bilateral effusions, right greater than left. Findings are compatible with congestive heart failure. 2. Bibasilar airspace disease likely reflects atelectasis. Lobar pneumonia is not excluded. Electronically Signed   By: San Morelle M.D.   On: 02/03/2017 18:53   Ct Head Wo Contrast  Result Date: 02/03/2017 CLINICAL DATA:  Headaches for several hours EXAM: CT HEAD WITHOUT CONTRAST TECHNIQUE: Contiguous  axial images were obtained from the base of the skull through the vertex without intravenous contrast. COMPARISON:  02/06/2012 FINDINGS: Brain: Mild atrophic changes are noted. Scattered areas of decreased attenuation are noted consistent with chronic white matter ischemic change. The overall appearance is stable from the prior exam. No findings to suggest acute hemorrhage, acute infarction or space-occupying mass lesion are noted. Vascular: No hyperdense vessel or unexpected calcification. Skull: Somewhat limited due to patient motion artifact. No acute bony abnormality is noted. Sinuses/Orbits: Opacification of the right maxillary antrum is seen which may be chronic in nature. Other: None IMPRESSION: Chronic changes without acute abnormality. Electronically Signed   By: Inez Catalina M.D.   On: 02/03/2017 19:11    Labs:  CBC:  Recent Labs  08/03/16 1456 09/28/16 1316 02/03/17 1814 02/09/17 1551  WBC 5.7 4.7 2.8* 2.9*  HGB 13.6 13.4 13.5 14.5  HCT 41.8 42.4 41.1 44.5  PLT 70* 64* 35* 30*    COAGS:  Recent Labs  01/11/17 1404 02/03/17 1814 02/09/17 1551 03/02/17 0913   INR 2.2 1.84 2.32 1.57    BMP:  Recent Labs  02/03/17 1814 02/09/17 1551 03/02/17 0913  NA 136 139 140  K 4.6 5.5* 4.2  CL 103 107 106  CO2 '27 26 26  '$ GLUCOSE 90 103* 106*  BUN '18 17 13  '$ CALCIUM 8.4* 8.9 8.9  CREATININE 1.24 1.19 1.22  GFRNONAA 54* 57* 55*  GFRAA >60 >60 >60    LIVER FUNCTION TESTS:  Recent Labs  02/03/17 1814  BILITOT 1.3*  AST 21  ALT 14*  ALKPHOS 90  PROT 6.4*  ALBUMIN 3.8    TUMOR MARKERS: No results for input(s): AFPTM, CEA, CA199, CHROMGRNA in the last 8760 hours.  Assessment and Plan: Leukopenia and thrombocytopenia Plan for bone marrow biopsy Labs reviewed. INR 1.57 despite taking his Coumadin. This is acceptable to proceed with bx Risks and Benefits discussed with the patient including, but not limited to bleeding, infection, damage to adjacent structures or low yield requiring additional tests. All of the patient's questions were answered, patient is agreeable to proceed. Consent signed and in chart.    Thank you for this interesting consult.  I greatly enjoyed meeting Cory Ryan and look forward to participating in their care.  A copy of this report was sent to the requesting provider on this date.  Electronically Signed: Ascencion Dike, PA-C 03/02/2017, 9:55 AM   I spent a total of 20 minutes in face to face in clinical consultation, greater than 50% of which was counseling/coordinating care for bone marrow biopsy

## 2017-03-04 ENCOUNTER — Encounter (HOSPITAL_COMMUNITY): Payer: Medicare Other

## 2017-03-04 DIAGNOSIS — Z7901 Long term (current) use of anticoagulants: Secondary | ICD-10-CM | POA: Diagnosis not present

## 2017-03-04 DIAGNOSIS — D72819 Decreased white blood cell count, unspecified: Secondary | ICD-10-CM | POA: Diagnosis not present

## 2017-03-04 DIAGNOSIS — Z87891 Personal history of nicotine dependence: Secondary | ICD-10-CM | POA: Diagnosis not present

## 2017-03-04 DIAGNOSIS — D696 Thrombocytopenia, unspecified: Secondary | ICD-10-CM

## 2017-03-04 DIAGNOSIS — I4891 Unspecified atrial fibrillation: Secondary | ICD-10-CM | POA: Diagnosis not present

## 2017-03-04 DIAGNOSIS — I429 Cardiomyopathy, unspecified: Secondary | ICD-10-CM | POA: Diagnosis not present

## 2017-03-04 DIAGNOSIS — I509 Heart failure, unspecified: Secondary | ICD-10-CM | POA: Diagnosis not present

## 2017-03-04 LAB — CBC WITH DIFFERENTIAL/PLATELET
BASOS ABS: 0 10*3/uL (ref 0.0–0.1)
BASOS PCT: 1 %
Eosinophils Absolute: 0.2 10*3/uL (ref 0.0–0.7)
Eosinophils Relative: 4 %
HEMATOCRIT: 41.1 % (ref 39.0–52.0)
HEMOGLOBIN: 13.4 g/dL (ref 13.0–17.0)
Lymphocytes Relative: 22 %
Lymphs Abs: 0.9 10*3/uL (ref 0.7–4.0)
MCH: 30.5 pg (ref 26.0–34.0)
MCHC: 32.6 g/dL (ref 30.0–36.0)
MCV: 93.4 fL (ref 78.0–100.0)
Monocytes Absolute: 0.3 10*3/uL (ref 0.1–1.0)
Monocytes Relative: 8 %
NEUTROS ABS: 2.6 10*3/uL (ref 1.7–7.7)
NEUTROS PCT: 65 %
Platelets: 72 10*3/uL — ABNORMAL LOW (ref 150–400)
RBC: 4.4 MIL/uL (ref 4.22–5.81)
RDW: 16.3 % — ABNORMAL HIGH (ref 11.5–15.5)
WBC: 4 10*3/uL (ref 4.0–10.5)

## 2017-03-04 LAB — VITAMIN B12: VITAMIN B 12: 360 pg/mL (ref 180–914)

## 2017-03-04 LAB — COMPREHENSIVE METABOLIC PANEL
ALT: 13 U/L — ABNORMAL LOW (ref 17–63)
ANION GAP: 6 (ref 5–15)
AST: 18 U/L (ref 15–41)
Albumin: 3.8 g/dL (ref 3.5–5.0)
Alkaline Phosphatase: 99 U/L (ref 38–126)
BUN: 16 mg/dL (ref 6–20)
CALCIUM: 8.7 mg/dL — AB (ref 8.9–10.3)
CO2: 28 mmol/L (ref 22–32)
Chloride: 103 mmol/L (ref 101–111)
Creatinine, Ser: 1.15 mg/dL (ref 0.61–1.24)
GFR calc non Af Amer: 60 mL/min — ABNORMAL LOW (ref >60)
Glucose, Bld: 95 mg/dL (ref 65–99)
Potassium: 4.2 mmol/L (ref 3.5–5.1)
SODIUM: 137 mmol/L (ref 135–145)
Total Bilirubin: 1.3 mg/dL — ABNORMAL HIGH (ref 0.3–1.2)
Total Protein: 7.3 g/dL (ref 6.5–8.1)

## 2017-03-04 LAB — IRON AND TIBC
IRON: 54 ug/dL (ref 45–182)
Saturation Ratios: 17 % — ABNORMAL LOW (ref 17.9–39.5)
TIBC: 312 ug/dL (ref 250–450)
UIBC: 258 ug/dL

## 2017-03-04 LAB — LACTATE DEHYDROGENASE: LDH: 165 U/L (ref 98–192)

## 2017-03-04 LAB — FOLATE: FOLATE: 17.2 ng/mL (ref >5.9)

## 2017-03-04 LAB — FERRITIN: FERRITIN: 121 ng/mL (ref 24–336)

## 2017-03-10 ENCOUNTER — Ambulatory Visit (INDEPENDENT_AMBULATORY_CARE_PROVIDER_SITE_OTHER): Payer: Medicare Other | Admitting: *Deleted

## 2017-03-10 DIAGNOSIS — I480 Paroxysmal atrial fibrillation: Secondary | ICD-10-CM

## 2017-03-10 DIAGNOSIS — Z5181 Encounter for therapeutic drug level monitoring: Secondary | ICD-10-CM | POA: Diagnosis not present

## 2017-03-10 LAB — POCT INR: INR: 1.5

## 2017-03-15 ENCOUNTER — Ambulatory Visit (INDEPENDENT_AMBULATORY_CARE_PROVIDER_SITE_OTHER): Payer: Medicare Other | Admitting: Otolaryngology

## 2017-03-16 ENCOUNTER — Encounter (HOSPITAL_COMMUNITY): Payer: Self-pay

## 2017-03-16 ENCOUNTER — Encounter (HOSPITAL_COMMUNITY): Payer: Medicare Other

## 2017-03-16 ENCOUNTER — Encounter (HOSPITAL_COMMUNITY): Payer: Medicare Other | Attending: Oncology | Admitting: Oncology

## 2017-03-16 VITALS — BP 126/80 | HR 94 | Temp 97.5°F | Resp 18 | Wt 163.8 lb

## 2017-03-16 DIAGNOSIS — D708 Other neutropenia: Secondary | ICD-10-CM

## 2017-03-16 DIAGNOSIS — I509 Heart failure, unspecified: Secondary | ICD-10-CM | POA: Diagnosis not present

## 2017-03-16 DIAGNOSIS — D72819 Decreased white blood cell count, unspecified: Secondary | ICD-10-CM | POA: Insufficient documentation

## 2017-03-16 DIAGNOSIS — Z7901 Long term (current) use of anticoagulants: Secondary | ICD-10-CM | POA: Insufficient documentation

## 2017-03-16 DIAGNOSIS — D696 Thrombocytopenia, unspecified: Secondary | ICD-10-CM

## 2017-03-16 DIAGNOSIS — I429 Cardiomyopathy, unspecified: Secondary | ICD-10-CM | POA: Insufficient documentation

## 2017-03-16 DIAGNOSIS — D72822 Plasmacytosis: Secondary | ICD-10-CM

## 2017-03-16 DIAGNOSIS — Z87891 Personal history of nicotine dependence: Secondary | ICD-10-CM | POA: Diagnosis not present

## 2017-03-16 DIAGNOSIS — I4891 Unspecified atrial fibrillation: Secondary | ICD-10-CM | POA: Insufficient documentation

## 2017-03-16 LAB — CBC WITH DIFFERENTIAL/PLATELET
Basophils Absolute: 0 10*3/uL (ref 0.0–0.1)
Basophils Relative: 1 %
EOS ABS: 0.2 10*3/uL (ref 0.0–0.7)
EOS PCT: 3 %
HCT: 40.8 % (ref 39.0–52.0)
Hemoglobin: 13.1 g/dL (ref 13.0–17.0)
LYMPHS ABS: 1 10*3/uL (ref 0.7–4.0)
Lymphocytes Relative: 20 %
MCH: 30.5 pg (ref 26.0–34.0)
MCHC: 32.1 g/dL (ref 30.0–36.0)
MCV: 94.9 fL (ref 78.0–100.0)
Monocytes Absolute: 0.5 10*3/uL (ref 0.1–1.0)
Monocytes Relative: 10 %
Neutro Abs: 3.3 10*3/uL (ref 1.7–7.7)
Neutrophils Relative %: 66 %
PLATELETS: 65 10*3/uL — AB (ref 150–400)
RBC: 4.3 MIL/uL (ref 4.22–5.81)
RDW: 17 % — ABNORMAL HIGH (ref 11.5–15.5)
WBC: 5 10*3/uL (ref 4.0–10.5)

## 2017-03-16 MED ORDER — VITAMIN B-12 1000 MCG PO TABS: 1000.0000 ug | ORAL_TABLET | Freq: Every day | ORAL | 2 refills | Status: AC

## 2017-03-16 NOTE — Patient Instructions (Signed)
[facility] at [facility] Discharge Instructions  RECOMMENDATIONS MADE BY THE CONSULTANT AND ANY TEST RESULTS WILL BE SENT TO YOUR REFERRING PHYSICIAN.  You saw Dr. Zhou today.  Thank you for choosing  [facility] Center at [facility] to provide your oncology and hematology care.  To afford each patient quality time with our provider, please arrive at least 15 minutes before your scheduled appointment time.    If you have a lab appointment with the Cancer Center please come in thru the  Main Entrance and check in at the main information desk  You need to re-schedule your appointment should you arrive 10 or more minutes late.  We strive to give you quality time with our providers, and arriving late affects you and other patients whose appointments are after yours.  Also, if you no show three or more times for appointments you may be dismissed from the clinic at the providers discretion.     Again, thank you for choosing [facility].  Our hope is that these requests will decrease the amount of time that you wait before being seen by our physicians.       _____________________________________________________________  Should you have questions after your visit to [facility], please contact our office at [phone] between the hours of 8:30 a.m. and 4:30 p.m.  Voicemails left after 4:30 p.m. will not be returned until the following business day.  For prescription refill requests, have your pharmacy contact our office.       Resources For Cancer Patients and their Caregivers ? American Cancer Society: Can assist with transportation, wigs, general needs, runs Look Good Feel Better.        1-888-227-6333 ? Cancer Care: Provides financial assistance, online support groups, medication/co-pay assistance.  1-800-813-HOPE (4673) ? [facility] Assists [place] cancer patients and their families through  emotional , educational and financial support.  336-427-4357 ? [place] DSS Where to apply for food stamps, Medicaid and utility assistance. 336-342-1394 ? [facility]: Transportation to medical appointments. 336-347-2287 ? Social Security Administration: May apply for disability if have a Stage IV cancer. 336-342-7796 1-800-772-1213 ? [place] Aging, Disability and Transit Services: Assists with nutrition, care and transit needs. 336-349-2343  Cancer Center Support Programs: @10RELATIVEDAYS@ > Cancer Support Group  2nd Tuesday of the month 1pm-2pm, Journey Room  > Creative Journey  3rd Tuesday of the month 1130am-1pm, Journey Room  > Look Good Feel Better  1st Wednesday of the month 10am-12 noon, Journey Room (Call American Cancer Society to register 1-800-395-5775)    

## 2017-03-16 NOTE — Progress Notes (Signed)
PROGRESS NOTE  Cory Ryan OB: May 11, 1941  MR#: [id number]  QMG#:[id number]  Patient Care Team: [name], MD as PCP - General (Family Medicine) [name], MD as Consulting Physician (Cardiology)  Progress Note  CHIEF COMPLAINT/ HPI: 76 year old patient came to hematology oncology clinic for evaluation regarding thrombocytopenia. Last documented platelet count was in June of 2017 was 76,000.  Patient also has slightly elevated white count. Reviewing the records patient has thrombocytopenia documented for last several years and has not significantly changed. Patient does not have any history of bleeding. Patient has a history of cardiomyopathy, congestive heart failure on chronic Coumadin therapy for atrial fibrillation Last INR was 2.0.  INTERVAL HISTORY: Cory Ryan is accompanied with his son. Patient underwent bone marrow biopsy on 03/02/17 patient's bone marrow was slightly hypercellular for age with trilineage hematopoiesis including abundant megakaryocytes display normal morphology. In the presence of thrombocytopenia, the bone marrow findings likely represent secondary changes due to peripheral platelet destruction sequestration or consumption. In this background, there is slight plasmacytosis present (plasma cells 6%). Immunohistochemical stains showed the plasma cells display kappa light chain excess. Patient repeat blood work performed on 03/04/17 which demonstrated WBC 4K, hemoglobin 13.4 g/dL, hematocrit 41.1%, MCV 93.4, platelet count 72K. Patient is doing a lot better since his last visit. Cory Ryan is now ambulating with a cane. Cory Ryan's gained about 8 pounds. Cory Ryan denies any chest pain, shortness breath, abdominal pain, focal weakness. His appetite is good.   REVIEW OF SYSTEMS:   Review of Systems  HENT: Negative for ear discharge and ear pain.   Eyes: Negative.   Respiratory: Negative.  Negative for hemoptysis.   Gastrointestinal: Negative for abdominal pain and blood in  stool.  Genitourinary: Negative.  Negative for hematuria.  Musculoskeletal: Negative.   Skin: Negative.   Neurological: Negative for weakness and headaches.  Endo/Heme/Allergies: Bruises/bleeds easily.  Psychiatric/Behavioral: Negative.   All other systems reviewed and are negative.   As per HPI. Otherwise, a complete review of systems is negatve.  PAST MEDICAL HISTORY: Past Medical History:  Diagnosis Date  . Anemia   . Anxiety   . Aortic stenosis    Moderate  . Arthritis   . Atrial fibrillation ([place])   . Chronic systolic heart failure (HCC)    LVEF 40-45%  . CKD (chronic kidney disease) stage 3, GFR 30-59 ml/min   . Colon cancer ([place])   . Colon polyps 12/2005   Per colonoscopy  . COPD (chronic obstructive pulmonary disease) ([place])   . Coronary atherosclerosis of native coronary artery    Multivessel status post CABG  . Emphysema   . Gastric erosions 12/2005   Per EGD - Dr. [name]  . GERD (gastroesophageal reflux disease)   . Hypothyroidism   . Ischemic cardiomyopathy   . Mitral regurgitation    Moderate  . Myocardial infarction [facility])    Remote  . Psoriasis   . Pulmonary nodules     PAST SURGICAL HISTORY: Past Surgical History:  Procedure Laterality Date  . COLECTOMY N/A 12/13/2013   Procedure: TOTAL COLECTOMY;  Surgeon: [name], MD;  Location: [facility] ORS;  Service: General;  Laterality: N/A;  . CORONARY ARTERY BYPASS GRAFT  March 2004   LIMA to LAD, SVG to diagonal, SVG to OM 2, SVG to RCA  . FLEXIBLE SIGMOIDOSCOPY  10/25/2012   Procedure: FLEXIBLE SIGMOIDOSCOPY;  Surgeon: [name], MD;  Location: [facility] ENDO SUITE;  Service: Gastroenterology;  Laterality: N/A;  . PARTIAL COLECTOMY  10/26/2012  Procedure: PARTIAL COLECTOMY;  Surgeon: [name], MD;  Location: [facility] ORS;  Service: General;  Laterality: N/A;  Closure of Colovesical Fistula    FAMILY HISTORY Family History  Problem Relation Age of Onset  . Cancer Brother   . Heart Problems Mother         unknown  . Hypertension Other      ADVANCED DIRECTIVES: Patient does not have any living will or healthcare power of attorney.  Information was given .  Available resources had been discussed.  We will follow-up on subsequent appointments regarding this issue   HEALTH MAINTENANCE: Social History  Substance Use Topics  . Smoking status: Former Smoker    Packs/day: 1.00    Years: 30.00    Types: Cigarettes    Quit date: 03/17/2003  . Smokeless tobacco: Never Used  . Alcohol use No     Comment: no alcohol use since '04     Colonoscopy: 10/25/2012  PAP: N/A  Bone density: Never  Lipid panel: 08/09/2015  No Known Allergies  Current Outpatient Prescriptions  Medication Sig Dispense Refill  . albuterol (PROVENTIL) (2.5 MG/3ML) 0.083% nebulizer solution Inhale 3 mLs into the lungs every 4 (four) hours as needed for wheezing or shortness of breath.     . carvedilol (COREG) 6.25 MG tablet Take 1 tablet (6.25 mg total) by mouth 2 (two) times daily with a meal. 60 tablet 11  . diltiazem (CARDIZEM CD) 120 MG 24 hr capsule Take 120 mg by mouth daily.    . furosemide (LASIX) 20 MG tablet Take 1 tablet (20 mg total) by mouth daily. 90 tablet 3  . Guaifenesin (MUCINEX MAXIMUM STRENGTH) 1200 MG TB12 Take 1 tablet by mouth daily as needed (for congestion).    [name] HYDROcodone-acetaminophen (NORCO) 5-325 MG per tablet Take 1-2 tablets by mouth every 4 (four) hours as needed. (Patient taking differently: Take 1 tablet by mouth every 6 (six) hours as needed for moderate pain. ) 40 tablet 0  . Ipratropium-Albuterol (COMBIVENT RESPIMAT) 20-100 MCG/ACT AERS respimat Inhale 1 puff into the lungs as needed. (Patient taking differently: Inhale 1 puff into the lungs every 6 (six) hours as needed for wheezing or shortness of breath. ) 1 Inhaler 1  . loperamide (IMODIUM) 2 MG capsule Take 1 capsule (2 mg total) by mouth as needed for diarrhea or loose stools. (Patient taking differently: Take 2-4 mg by mouth as  needed for diarrhea or loose stools. ) 20 capsule 2  . potassium chloride SA (K-DUR,KLOR-CON) 20 MEQ tablet Take 0.5 tablets (10 mEq total) by mouth daily. 90 tablet 3  . warfarin (COUMADIN) 5 MG tablet Take 1/2 tablet daily except 1 tablet on Tuesdays and Fridays (Patient taking differently: Take 2.5-5 mg by mouth See admin instructions. Take 1/2 tablet daily except 1 tablet on Tuesdays and Fridays) 30 tablet 3   No current facility-administered medications for this visit.     Vitals:   03/16/17 1312  BP: 126/80  Pulse: 94  Resp: 18  Temp: 97.5 F (36.4 C)   Filed Weights   03/16/17 1312  Weight: 163 lb 12.8 oz (74.3 kg)      OBJECTIVE: Physical Exam  Constitutional: Cory Ryan is oriented to person, place, and time and well-developed, well-nourished, and in no distress. No distress.  HENT:  Head: Normocephalic and atraumatic.  Mouth/Throat: No oropharyngeal exudate.  Eyes: Conjunctivae and EOM are normal. Pupils are equal, round, and reactive to light. No scleral icterus.  Neck: Normal range of  motion. Neck supple. No JVD present.  Cardiovascular: Normal rate, regular rhythm and normal heart sounds.  Exam reveals no gallop and no friction rub.   No murmur heard. Pulmonary/Chest: Effort normal and breath sounds normal. No respiratory distress. Cory Ryan has no wheezes. Cory Ryan has no rales.  Abdominal: Soft. Bowel sounds are normal. Cory Ryan exhibits no distension. There is no tenderness. There is no guarding.  Musculoskeletal: Normal range of motion. Cory Ryan exhibits no edema or tenderness.  Lymphadenopathy:    Cory Ryan has no cervical adenopathy.  Neurological: Cory Ryan is alert and oriented to person, place, and time. No cranial nerve deficit.  Skin: Skin is warm and dry. No rash noted. No erythema. No pallor.  Psychiatric: Affect and judgment normal.  Nursing note and vitals reviewed.     ECOG FS:2 - Symptomatic, <50% confined to bed  LAB RESULTS:  No visits with results within 5 Day(s) from this visit.    Latest known visit with results is:  Appointment on 03/04/2017  Component Date Value Ref Range Status  . Sodium 03/04/2017 137  135 - 145 mmol/L Final  . Potassium 03/04/2017 4.2  3.5 - 5.1 mmol/L Final  . Chloride 03/04/2017 103  101 - 111 mmol/L Final  . CO2 03/04/2017 28  22 - 32 mmol/L Final  . Glucose, Bld 03/04/2017 95  65 - 99 mg/dL Final  . BUN 03/04/2017 16  6 - 20 mg/dL Final  . Creatinine, Ser 03/04/2017 1.15  0.61 - 1.24 mg/dL Final  . Calcium 03/04/2017 8.7* 8.9 - 10.3 mg/dL Final  . Total Protein 03/04/2017 7.3  6.5 - 8.1 g/dL Final  . Albumin 03/04/2017 3.8  3.5 - 5.0 g/dL Final  . AST 03/04/2017 18  15 - 41 U/L Final  . ALT 03/04/2017 13* 17 - 63 U/L Final  . Alkaline Phosphatase 03/04/2017 99  38 - 126 U/L Final  . Total Bilirubin 03/04/2017 1.3* 0.3 - 1.2 mg/dL Final  . GFR calc non Af Amer 03/04/2017 60* >60 mL/min Final  . GFR calc Af Amer 03/04/2017 >60  >60 mL/min Final   Comment: (NOTE) The eGFR has been calculated using the CKD EPI equation. This calculation has not been validated in all clinical situations. eGFR's persistently <60 mL/min signify possible Chronic Kidney Disease.   . Anion gap 03/04/2017 6  5 - 15 Final  . WBC 03/04/2017 4.0  4.0 - 10.5 K/uL Final  . RBC 03/04/2017 4.40  4.22 - 5.81 MIL/uL Final  . Hemoglobin 03/04/2017 13.4  13.0 - 17.0 g/dL Final  . HCT 03/04/2017 41.1  39.0 - 52.0 % Final  . MCV 03/04/2017 93.4  78.0 - 100.0 fL Final  . MCH 03/04/2017 30.5  26.0 - 34.0 pg Final  . MCHC 03/04/2017 32.6  30.0 - 36.0 g/dL Final  . RDW 03/04/2017 16.3* 11.5 - 15.5 % Final  . Platelets 03/04/2017 72* 150 - 400 K/uL Final   Comment: CONSISTENT WITH PREVIOUS RESULT SPECIMEN CHECKED FOR CLOTS   . Neutrophils Relative % 03/04/2017 65  % Final  . Neutro Abs 03/04/2017 2.6  1.7 - 7.7 K/uL Final  . Lymphocytes Relative 03/04/2017 22  % Final  . Lymphs Abs 03/04/2017 0.9  0.7 - 4.0 K/uL Final  . Monocytes Relative 03/04/2017 8  % Final  .  Monocytes Absolute 03/04/2017 0.3  0.1 - 1.0 K/uL Final  . Eosinophils Relative 03/04/2017 4  % Final  . Eosinophils Absolute 03/04/2017 0.2  0.0 - 0.7 K/uL Final  . Basophils  Relative 03/04/2017 1  % Final  . Basophils Absolute 03/04/2017 0.0  0.0 - 0.1 K/uL Final  . LDH 03/04/2017 165  98 - 192 U/L Final  . Vitamin B-12 03/04/2017 360  180 - 914 pg/mL Final   Comment: (NOTE) This assay is not validated for testing neonatal or myeloproliferative syndrome specimens for Vitamin B12 levels. Performed at [facility], [place] [address], [place], [place] [address]   . Folate 03/04/2017 17.2  >5.9 ng/mL Final   Performed at [facility], [place] [address] [place], [place] [address]  . Ferritin 03/04/2017 121  24 - 336 ng/mL Final   Performed at [facility], [place] [address] [place], [place] [address]  . Iron 03/04/2017 54  45 - 182 ug/dL Final  . TIBC 03/04/2017 312  250 - 450 ug/dL Final  . Saturation Ratios 03/04/2017 17* 17.9 - 39.5 % Final  . UIBC 03/04/2017 258  ug/dL Final   Performed at [facility] [address] [place], [place] [address]   CBC Latest Ref Rng & Units 03/04/2017 03/02/2017 02/09/2017  WBC 4.0 - 10.5 K/uL 4.0 4.2 2.9(L)  Hemoglobin 13.0 - 17.0 g/dL 13.4 14.0 14.5  Hematocrit 39.0 - 52.0 % 41.1 43.0 44.5  Platelets 150 - 400 K/uL 72(L) 68(L) 30(L)     RADIOLOGY: I have reviewed the images below and agree with the reported results  CHEST  2 VIEW 02/03/2017  IMPRESSION: 1. Mild edema and bilateral effusions, right greater than left. Findings are compatible with congestive heart failure. 2. Bibasilar airspace disease likely reflects atelectasis. Lobar pneumonia is not excluded.  CT HEAD WITHOUT CONTRAST 02/03/2017  IMPRESSION: Chronic changes without acute abnormality.  PATHOLOGY: FINAL DIAGNOSIS Diagnosis Bone Marrow, Aspirate,Biopsy, and Clot, right iliac - SLIGHTLY HYPERCELLULAR BONE MARROW FOR AGE WITH TRILINEAGE HEMATOPOIESIS. -  SLIGHT PLASMACYTOSIS (PLASMA CELLS 6%). - SEE COMMENT. PERIPHERAL BLOOD: - THROMBOCYTOPENIA.  Diagnosis Note The bone marrow is slightly hypercellular for age with trilineage hematopoiesis including abundant megakaryocytes displaying normal morphology. In the presence of thrombocytopenia, the bone marrow findings likely represent secondary changes due to peripheral platelet destruction, sequestration or consumption. In this background, there is slight plasmacytosis present (plasma cells 6%). Immunohistochemical stains show that the plasma cells display kappa light chain excess. The findings are limited but most suggestive of early involvement by plasma cell dyscrasia/neoplasm. 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 - His bone marrow bx demonstrated that his bone marrow was functioning normally in producing abundant megakaryocytes.  - Continue to monitor platelets. Repeat CBC today. - B12 on the low ends of normal. I will send in an oral B12 supplement for the patient to take.  New onset leukopenia - Resolved.  - Repeat CBC today.  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   RTC in 3 months with labs.  Orders Placed This Encounter  Procedures  . CBC with Differential    Standing Status:   Future    Number of Occurrences:   1    Standing Expiration Date:   03/16/2018  . Multiple Myeloma Panel (SPEP&IFE w/QIG)    Standing  Status:   Future    Number of Occurrences:   1    Standing Expiration Date:   03/16/2018  . Kappa/lambda light chains    Standing Status:   Future    Number of Occurrences:   1     Standing Expiration Date:   03/16/2018  . Beta 2 microglobuline, serum    Standing Status:   Future    Number of Occurrences:   1    Standing Expiration Date:   03/16/2018  . CBC with Differential    Standing Status:   Future    Standing Expiration Date:   03/16/2018  . Comprehensive metabolic panel    Standing Status:   Future    Standing Expiration Date:   03/16/2018  . Multiple Myeloma Panel (SPEP&IFE w/QIG)    Standing Status:   Future    Standing Expiration Date:   03/16/2018  . Kappa/lambda light chains    Standing Status:   Future    Standing Expiration Date:   03/16/2018  . Beta 2 microglobuline, serum    Standing Status:   Future    Standing Expiration Date:   03/16/2018  . Vitamin B12    Standing Status:   Future    Standing Expiration Date:   03/16/2018  . Lactate dehydrogenase    Standing Status:   Future    Standing Expiration Date:   03/16/2018        Patient expressed understanding and was in agreement with this plan. Cory Ryan also understands that Cory Ryan can call clinic at any time with any questions, concerns, or complaints. Cory Ryan was instructed on signs and symptoms of concern such as nose bleeding, gum bleeding, unusual bruising.   This document serves as a record of services personally performed by Twana First, MD. It was created on her behalf by Shirlean Mylar, a trained medical scribe. The creation of this record is based on the scribe's personal observations and the provider's statements to them. This document has been checked and approved by the attending provider.  I have reviewed the above documentation for accuracy and completeness and I agree with the above.  Twana First, MD   03/16/2017 1:13 PM

## 2017-03-17 LAB — KAPPA/LAMBDA LIGHT CHAINS
KAPPA FREE LGHT CHN: 29.6 mg/L — AB (ref 3.3–19.4)
Kappa, lambda light chain ratio: 0.75 (ref 0.26–1.65)
LAMDA FREE LIGHT CHAINS: 39.3 mg/L — AB (ref 5.7–26.3)

## 2017-03-17 LAB — BETA 2 MICROGLOBULIN, SERUM: BETA 2 MICROGLOBULIN: 3.5 mg/L — AB (ref 0.6–2.4)

## 2017-03-18 LAB — MULTIPLE MYELOMA PANEL, SERUM
ALPHA 1: 0.3 g/dL (ref 0.0–0.4)
Albumin SerPl Elph-Mcnc: 3.6 g/dL (ref 2.9–4.4)
Albumin/Glob SerPl: 1.2 (ref 0.7–1.7)
Alpha2 Glob SerPl Elph-Mcnc: 0.6 g/dL (ref 0.4–1.0)
B-Globulin SerPl Elph-Mcnc: 1 g/dL (ref 0.7–1.3)
Gamma Glob SerPl Elph-Mcnc: 1.2 g/dL (ref 0.4–1.8)
Globulin, Total: 3.1 g/dL (ref 2.2–3.9)
IGA: 273 mg/dL (ref 61–437)
IGM, SERUM: 99 mg/dL (ref 15–143)
IgG (Immunoglobin G), Serum: 1058 mg/dL (ref 700–1600)
Total Protein ELP: 6.7 g/dL (ref 6.0–8.5)

## 2017-03-22 ENCOUNTER — Encounter (HOSPITAL_COMMUNITY): Payer: Self-pay

## 2017-03-24 ENCOUNTER — Ambulatory Visit (INDEPENDENT_AMBULATORY_CARE_PROVIDER_SITE_OTHER): Payer: Medicare Other | Admitting: *Deleted

## 2017-03-24 DIAGNOSIS — I480 Paroxysmal atrial fibrillation: Secondary | ICD-10-CM | POA: Diagnosis not present

## 2017-03-24 DIAGNOSIS — Z5181 Encounter for therapeutic drug level monitoring: Secondary | ICD-10-CM | POA: Diagnosis not present

## 2017-03-24 LAB — POCT INR: INR: 1.7

## 2017-03-25 LAB — CHROMOSOME ANALYSIS, BONE MARROW

## 2017-04-07 ENCOUNTER — Ambulatory Visit (INDEPENDENT_AMBULATORY_CARE_PROVIDER_SITE_OTHER): Payer: Medicare Other | Admitting: *Deleted

## 2017-04-07 DIAGNOSIS — Z5181 Encounter for therapeutic drug level monitoring: Secondary | ICD-10-CM

## 2017-04-07 DIAGNOSIS — I480 Paroxysmal atrial fibrillation: Secondary | ICD-10-CM

## 2017-04-07 LAB — POCT INR: INR: 2.2

## 2017-05-05 ENCOUNTER — Ambulatory Visit (INDEPENDENT_AMBULATORY_CARE_PROVIDER_SITE_OTHER): Payer: Medicare Other | Admitting: *Deleted

## 2017-05-05 DIAGNOSIS — Z5181 Encounter for therapeutic drug level monitoring: Secondary | ICD-10-CM | POA: Diagnosis not present

## 2017-05-05 DIAGNOSIS — I480 Paroxysmal atrial fibrillation: Secondary | ICD-10-CM | POA: Diagnosis not present

## 2017-05-05 LAB — POCT INR: INR: 2.1

## 2017-05-20 ENCOUNTER — Ambulatory Visit (INDEPENDENT_AMBULATORY_CARE_PROVIDER_SITE_OTHER): Payer: Medicare Other | Admitting: Adult Health

## 2017-05-20 ENCOUNTER — Encounter: Payer: Self-pay | Admitting: Adult Health

## 2017-05-20 ENCOUNTER — Other Ambulatory Visit (HOSPITAL_COMMUNITY)
Admission: RE | Admit: 2017-05-20 | Discharge: 2017-05-20 | Disposition: A | Discharge status: Home / Self Care | Payer: Medicare Other | Source: Ambulatory Visit | Attending: Adult Health | Admitting: Adult Health

## 2017-05-20 VITALS — BP 106/62 | HR 83 | Ht 69.0 in | Wt 162.0 lb

## 2017-05-20 DIAGNOSIS — I482 Chronic atrial fibrillation, unspecified: Secondary | ICD-10-CM

## 2017-05-20 DIAGNOSIS — I5022 Chronic systolic (congestive) heart failure: Secondary | ICD-10-CM | POA: Diagnosis not present

## 2017-05-20 DIAGNOSIS — I38 Endocarditis, valve unspecified: Secondary | ICD-10-CM

## 2017-05-20 LAB — CBC
HCT: 38.7 % — ABNORMAL LOW (ref 39.0–52.0)
Hemoglobin: 12.8 g/dL — ABNORMAL LOW (ref 13.0–17.0)
MCH: 32.3 pg (ref 26.0–34.0)
MCHC: 33.1 g/dL (ref 30.0–36.0)
MCV: 97.7 fL (ref 78.0–100.0)
PLATELETS: 58 10*3/uL — AB (ref 150–400)
RBC: 3.96 MIL/uL — AB (ref 4.22–5.81)
RDW: 15.1 % (ref 11.5–15.5)
WBC: 4.5 10*3/uL (ref 4.0–10.5)

## 2017-05-20 LAB — BASIC METABOLIC PANEL
ANION GAP: 7 (ref 5–15)
BUN: 17 mg/dL (ref 6–20)
CALCIUM: 8.8 mg/dL — AB (ref 8.9–10.3)
CO2: 26 mmol/L (ref 22–32)
CREATININE: 1.13 mg/dL (ref 0.61–1.24)
Chloride: 105 mmol/L (ref 101–111)
GLUCOSE: 92 mg/dL (ref 65–99)
Potassium: 4.1 mmol/L (ref 3.5–5.1)
Sodium: 138 mmol/L (ref 135–145)

## 2017-05-20 NOTE — Patient Instructions (Signed)
Medication Instructions:   Your physician recommends that you continue on your current medications as directed. Please refer to the Current Medication list given to you today.  Labwork:  Your physician recommends that you return for lab work today to check her BMET & CBC.  Testing/Procedures:  NONE  Follow-Up:  Your physician recommends that you schedule a follow-up appointment in: 3 months.   Any Other Special Instructions Will Be Listed Below (If Applicable). Your physician has requested that you regularly monitor and record your blood pressure readings at home. Please use the same machine at the same time of day to check your readings and record them to bring to your follow-up visit. Use the parameters given to you today to determine if you need to contact us about your readings. Please purchase a home blood pressure monitor.   If you need a refill on your cardiac medications before your next appointment, please call your pharmacy.

## 2017-05-20 NOTE — Progress Notes (Addendum)
Cardiology Office Note   Date:  05/20/2017   ID:  Cory, Ryan 07-May-1941, MRN [id number]  PCP:  [name], MD  Cardiologist:  Domenic Polite   Chief Complaint  Patient presents with  . Coronary Artery Disease  . Atrial Fibrillation      History of Present Illness: Cory Ryan is a 76 y.o. male who presents for ongoing assessment and management of coronary artery disease, status post CABG, ischemic cardiomyopathy, chronic systolic heart failure, aortic valve stenosis, chronic atrial fibrillation on Coumadin therapy, and history of aortic valve stenosis.  The patient was last seen in the office on 02/16/2017. On that last office visit the patient was feeling well and was without any cardiac complaints. He was having a planned bone marrow biopsy at [facility], in the setting of thrombocytopenia and leukopenia, and was to hold Coumadin prior to the procedure. At the time of the last office visit the patient's medications for not changed. He was restarted on potassium 10 mEq daily as that had been held in the setting of hyperkalemia.  He is here today without any cardiac complaints. He occasionally takes an extra dose of Lasix if he begins to swell in the lower extremities. He's only had to do that about once a week. He continues to be medically compliant. He had does have some dizziness on occasion. He has been followed by oncology and is now on B12 injections. He injured his right arm on getting it caught in a door as it was closing, and has a large amount of ecchymosis in the right forearm and biceps area.  Past Medical History:  Diagnosis Date  . Anemia   . Anxiety   . Aortic stenosis    Moderate  . Arthritis   . Atrial fibrillation ([place])   . Chronic systolic heart failure (HCC)    LVEF 40-45%  . CKD (chronic kidney disease) stage 3, GFR 30-59 ml/min   . Colon cancer ([place])   . Colon polyps 12/2005   Per colonoscopy  . COPD (chronic obstructive pulmonary disease) ([place])    . Coronary atherosclerosis of native coronary artery    Multivessel status post CABG  . Emphysema   . Gastric erosions 12/2005   Per EGD - Dr. [name]  . GERD (gastroesophageal reflux disease)   . Hypothyroidism   . Ischemic cardiomyopathy   . Mitral regurgitation    Moderate  . Myocardial infarction [facility])    Remote  . Psoriasis   . Pulmonary nodules     Past Surgical History:  Procedure Laterality Date  . COLECTOMY N/A 12/13/2013   Procedure: TOTAL COLECTOMY;  Surgeon: [name], MD;  Location: [facility] ORS;  Service: General;  Laterality: N/A;  . CORONARY ARTERY BYPASS GRAFT  March 2004   LIMA to LAD, SVG to diagonal, SVG to OM 2, SVG to RCA  . FLEXIBLE SIGMOIDOSCOPY  10/25/2012   Procedure: FLEXIBLE SIGMOIDOSCOPY;  Surgeon: [name], MD;  Location: [facility] END[facility] SU[facility];  Service: Gastroenterology;  Laterality: N/A;  . PARTIAL COLECTOMY  10/26/2012   Procedure: PARTIAL COLECTOMY;  Surgeon: [name], MD;  Location: [facility] ORS;  Service: General;  Laterality: N/A;  Closure of Colovesical Fistula     Current Outpatient Prescriptions  Medication Sig Dispense Refill  . albuterol (PROVENTIL) (2.5 MG/3ML) 0.083% nebulizer solution Inhale 3 mLs into the lungs every 4 (four) hours as needed for wheezing or shortness of breath.     . carvedilol (COREG) 6.25 MG  tablet Take 1 tablet (6.25 mg total) by mouth 2 (two) times daily with a meal. 60 tablet 11  . diltiazem (CARDIZEM CD) 120 MG 24 hr capsule Take 120 mg by mouth daily.    . furosemide (LASIX) 20 MG tablet Take 1 tablet (20 mg total) by mouth daily. 90 tablet 3  . Guaifenesin (MUCINEX MAXIMUM STRENGTH) 1200 MG TB12 Take 1 tablet by mouth daily as needed (for congestion).    [name] HYDROcodone-acetaminophen (NORCO) 5-325 MG per tablet Take 1-2 tablets by mouth every 4 (four) hours as needed. (Patient taking differently: Take 1 tablet by mouth every 6 (six) hours as needed for moderate pain. ) 40 tablet 0  . Ipratropium-Albuterol (COMBIVENT  RESPIMAT) 20-100 MCG/ACT AERS respimat Inhale 1 puff into the lungs as needed. (Patient taking differently: Inhale 1 puff into the lungs every 6 (six) hours as needed for wheezing or shortness of breath. ) 1 Inhaler 1  . loperamide (IMODIUM) 2 MG capsule Take 1 capsule (2 mg total) by mouth as needed for diarrhea or loose stools. (Patient taking differently: Take 2-4 mg by mouth as needed for diarrhea or loose stools. ) 20 capsule 2  . potassium chloride SA (K-DUR,KLOR-CON) 20 MEQ tablet Take 0.5 tablets (10 mEq total) by mouth daily. 90 tablet 3  . vitamin B-12 (CYANOCOBALAMIN) 1000 MCG tablet Take 1 tablet (1,000 mcg total) by mouth daily. 60 tablet 2  . warfarin (COUMADIN) 5 MG tablet Take 1/2 tablet daily except 1 tablet on Tuesdays and Fridays (Patient taking differently: Take 2.5-5 mg by mouth See admin instructions. Take 1/2 tablet daily except 1 tablet on Tuesdays and Fridays) 30 tablet 3   No current facility-administered medications for this visit.     Allergies:   Patient has no known allergies.    Social History:  The patient  reports that he quit smoking about 14 years ago. His smoking use included Cigarettes. He has a 30.00 pack-year smoking history. He has never used smokeless tobacco. He reports that he does not drink alcohol or use drugs.   Family History:  The patient's family history includes Cancer in his brother; Heart Problems in his mother; Hypertension in his other.    ROS: All other systems are reviewed and negative. Unless otherwise mentioned in H&P    PHYSICAL EXAM: VS:  BP 106/62 (BP Location: Left Arm)   Pulse 83   Ht '5\' 9"'$  (1.753 m)   Wt 162 lb (73.5 kg)   SpO2 96%   BMI 23.92 kg/m  , BMI Body mass index is 23.92 kg/m. GEN: Well nourished, well developed, in no acute distress  HEENT: normal  Neck: no JVD, carotid bruits, or masses Cardiac: RRR, 1/6 systolic murmur, tachycardic; no murmurs, rubs, or gallops, dependent edema  Respiratory:  clear to  auscultation bilaterally, normal work of breathing GI: soft, nontender, nondistended, + BS MS: no deformity or atrophy Large area of ecchymosis on the right forearm. Venous statis skin changes, thickening,  Skin: warm and dry, no rash Neuro:  Strength and sensation are intact Psych: euthymic mood, full affect  Recent Labs: 02/09/2017: B Natriuretic Peptide 880.0 03/04/2017: ALT 13; BUN 16; Creatinine, Ser 1.15; Potassium 4.2; Sodium 137 03/16/2017: Hemoglobin 13.1; Platelets 65    Lipid Panel    Component Value Date/Time   CHOL 145 08/09/2015 0438   TRIG 75 08/09/2015 0438   HDL 27 (L) 08/09/2015 0438   CHOLHDL 5.4 08/09/2015 0438   VLDL 15 08/09/2015 0438   LDLCALC 103 (  H) 08/09/2015 0438      Wt Readings from Last 3 Encounters:  05/20/17 162 lb (73.5 kg)  03/16/17 163 lb 12.8 oz (74.3 kg)  02/16/17 152 lb (68.9 kg)      Other studies Reviewed:  Echocardiogram 10/23/2015:  - Left ventricle: The cavity size was normal. Wall thickness was  increased in a pattern of mild LVH. Systolic function was mildly  to moderately reduced. The estimated ejection fraction was in the  range of 40% to 45%. The study was not technically sufficient to  allow evaluation of LV diastolic dysfunction due to atrial  fibrillation. - Regional wall motion abnormality: Akinesis of the basal  inferoseptal, basal-mid inferior, and mid inferolateral  myocardium; severe hypokinesis of the mid inferoseptal  myocardium; moderate hypokinesis of the mid anteroseptal and  basal anterolateral myocardium; mild hypokinesis of the mid  anterior and mid anterolateral myocardium. - Aortic valve: Morphologically, there was at least mild aortic  stenosis. Moderately calcified annulus. Mildly thickened,  moderately calcified leaflets. There was trivial regurgitation. - Mitral valve: Calcified annulus. Normal thickness leaflets .  There was moderate regurgitation. - Left atrium: The atrium was  moderately to severely dilated. - Right ventricle: Systolic function was moderately to severely  reduced. - Right atrium: The atrium was mildly to moderately dilated. - Atrial septum: No defect or patent foramen ovale was identified. - Tricuspid valve: There was moderate regurgitation. - Pulmonic valve: There was mild regurgitation. - Pulmonary arteries: Systolic pressure was severely increased. PA  peak pressure: 63 mm Hg (S). - Inferior vena cava: Estimated CVP 15 mmHg.  ASSESSMENT AND PLAN:  1. Chronic systolic heart failure: Weight has been stable but he does have a large amount of dependent edema with venous stasis skin changes and thickening. He occasionally takes an extra dose of Lasix 20 mg if necessary when he notices that his legs are swelling more than usual. He denies any shortness of breath muscle cramping or chest pain.  I'm good to check a BMET as he is slightly hypotensive today on his current medication regimen. One to evaluate for dehydration.  I have asked him to buy a blood pressure monitor and to record his blood pressure. Would like to avoid hypotension on multiple medications. He is to call us for significantly low blood pressure recordings.  2. Chronic atrial fibrillation: Heart rate is slightly elevated today. He remains on beta blocker, and diltiazem along with Coumadin therapy which is followed in our Coumadin clinic. He has a follow-up appointment on August 22 for ongoing management. CBC will be ordered on coumadin with excessive bruising.   3. B12 deficiency: Being followed by oncology and is getting injection supplements. He is status post bone marrow aspiration.  4. Aortic valve stenosis: Described as mild by most recent echocardiogram above. Will need to have repeat echocardiogram after next visit for ongoing assessment. He is currently asymptomatic   Current medicines are reviewed at length with the patient today.    Labs/ tests ordered today include:    Phill Myron. [place] Pugh, ANP, AACC   05/20/2017 1:17 PM    [facility] [address] [place], [place] [address] Phone: [phone]; Fax: [phone]

## 2017-06-02 ENCOUNTER — Ambulatory Visit (INDEPENDENT_AMBULATORY_CARE_PROVIDER_SITE_OTHER): Payer: Medicare Other | Admitting: *Deleted

## 2017-06-02 DIAGNOSIS — Z5181 Encounter for therapeutic drug level monitoring: Secondary | ICD-10-CM

## 2017-06-02 DIAGNOSIS — I480 Paroxysmal atrial fibrillation: Secondary | ICD-10-CM | POA: Diagnosis not present

## 2017-06-02 LAB — POCT INR: INR: 1.7

## 2017-06-15 ENCOUNTER — Other Ambulatory Visit: Payer: Self-pay | Admitting: Cardiology

## 2017-06-16 ENCOUNTER — Other Ambulatory Visit (HOSPITAL_COMMUNITY): Payer: Medicare Other

## 2017-06-16 ENCOUNTER — Encounter (HOSPITAL_COMMUNITY): Payer: Medicare Other

## 2017-06-16 ENCOUNTER — Encounter (HOSPITAL_COMMUNITY): Payer: Self-pay | Admitting: Oncology

## 2017-06-16 ENCOUNTER — Encounter (HOSPITAL_COMMUNITY): Payer: Medicare Other | Attending: Oncology | Admitting: Oncology

## 2017-06-16 VITALS — BP 129/56 | HR 74 | Resp 16 | Ht 69.0 in | Wt 160.6 lb

## 2017-06-16 DIAGNOSIS — D708 Other neutropenia: Secondary | ICD-10-CM

## 2017-06-16 DIAGNOSIS — J449 Chronic obstructive pulmonary disease, unspecified: Secondary | ICD-10-CM | POA: Insufficient documentation

## 2017-06-16 DIAGNOSIS — I4891 Unspecified atrial fibrillation: Secondary | ICD-10-CM | POA: Insufficient documentation

## 2017-06-16 DIAGNOSIS — Z951 Presence of aortocoronary bypass graft: Secondary | ICD-10-CM | POA: Insufficient documentation

## 2017-06-16 DIAGNOSIS — I340 Nonrheumatic mitral (valve) insufficiency: Secondary | ICD-10-CM | POA: Diagnosis not present

## 2017-06-16 DIAGNOSIS — K219 Gastro-esophageal reflux disease without esophagitis: Secondary | ICD-10-CM | POA: Diagnosis not present

## 2017-06-16 DIAGNOSIS — D696 Thrombocytopenia, unspecified: Secondary | ICD-10-CM | POA: Diagnosis not present

## 2017-06-16 DIAGNOSIS — I255 Ischemic cardiomyopathy: Secondary | ICD-10-CM | POA: Diagnosis not present

## 2017-06-16 DIAGNOSIS — I350 Nonrheumatic aortic (valve) stenosis: Secondary | ICD-10-CM | POA: Insufficient documentation

## 2017-06-16 DIAGNOSIS — Z87891 Personal history of nicotine dependence: Secondary | ICD-10-CM | POA: Insufficient documentation

## 2017-06-16 DIAGNOSIS — Z85038 Personal history of other malignant neoplasm of large intestine: Secondary | ICD-10-CM | POA: Insufficient documentation

## 2017-06-16 DIAGNOSIS — F419 Anxiety disorder, unspecified: Secondary | ICD-10-CM | POA: Diagnosis not present

## 2017-06-16 DIAGNOSIS — Z7901 Long term (current) use of anticoagulants: Secondary | ICD-10-CM | POA: Diagnosis not present

## 2017-06-16 DIAGNOSIS — D72822 Plasmacytosis: Secondary | ICD-10-CM | POA: Diagnosis not present

## 2017-06-16 DIAGNOSIS — I5022 Chronic systolic (congestive) heart failure: Secondary | ICD-10-CM | POA: Insufficient documentation

## 2017-06-16 DIAGNOSIS — Z86010 Personal history of colonic polyps: Secondary | ICD-10-CM | POA: Insufficient documentation

## 2017-06-16 DIAGNOSIS — N183 Chronic kidney disease, stage 3 (moderate): Secondary | ICD-10-CM | POA: Insufficient documentation

## 2017-06-16 DIAGNOSIS — L409 Psoriasis, unspecified: Secondary | ICD-10-CM | POA: Diagnosis not present

## 2017-06-16 DIAGNOSIS — I252 Old myocardial infarction: Secondary | ICD-10-CM | POA: Insufficient documentation

## 2017-06-16 DIAGNOSIS — Z9049 Acquired absence of other specified parts of digestive tract: Secondary | ICD-10-CM | POA: Diagnosis not present

## 2017-06-16 DIAGNOSIS — E039 Hypothyroidism, unspecified: Secondary | ICD-10-CM | POA: Diagnosis not present

## 2017-06-16 DIAGNOSIS — I2510 Atherosclerotic heart disease of native coronary artery without angina pectoris: Secondary | ICD-10-CM | POA: Diagnosis not present

## 2017-06-16 LAB — CBC WITH DIFFERENTIAL/PLATELET
BASOS ABS: 0 10*3/uL (ref 0.0–0.1)
Basophils Relative: 1 %
Eosinophils Absolute: 0.2 10*3/uL (ref 0.0–0.7)
Eosinophils Relative: 5 %
HEMATOCRIT: 38.5 % — AB (ref 39.0–52.0)
HEMOGLOBIN: 12.7 g/dL — AB (ref 13.0–17.0)
LYMPHS ABS: 1.1 10*3/uL (ref 0.7–4.0)
LYMPHS PCT: 23 %
MCH: 31.8 pg (ref 26.0–34.0)
MCHC: 33 g/dL (ref 30.0–36.0)
MCV: 96.5 fL (ref 78.0–100.0)
Monocytes Absolute: 0.5 10*3/uL (ref 0.1–1.0)
Monocytes Relative: 10 %
NEUTROS PCT: 62 %
Neutro Abs: 3 10*3/uL (ref 1.7–7.7)
Platelets: 54 10*3/uL — ABNORMAL LOW (ref 150–400)
RBC: 3.99 MIL/uL — AB (ref 4.22–5.81)
RDW: 14.5 % (ref 11.5–15.5)
WBC: 4.8 10*3/uL (ref 4.0–10.5)

## 2017-06-16 LAB — COMPREHENSIVE METABOLIC PANEL
ALT: 10 U/L — AB (ref 17–63)
AST: 17 U/L (ref 15–41)
Albumin: 3.9 g/dL (ref 3.5–5.0)
Alkaline Phosphatase: 80 U/L (ref 38–126)
Anion gap: 7 (ref 5–15)
BILIRUBIN TOTAL: 1.4 mg/dL — AB (ref 0.3–1.2)
BUN: 18 mg/dL (ref 6–20)
CO2: 26 mmol/L (ref 22–32)
Calcium: 8.8 mg/dL — ABNORMAL LOW (ref 8.9–10.3)
Chloride: 105 mmol/L (ref 101–111)
Creatinine, Ser: 1.29 mg/dL — ABNORMAL HIGH (ref 0.61–1.24)
GFR calc Af Amer: 60 mL/min — ABNORMAL LOW (ref >60)
GFR, EST NON AFRICAN AMERICAN: 51 mL/min — AB (ref >60)
Glucose, Bld: 98 mg/dL (ref 65–99)
Potassium: 3.8 mmol/L (ref 3.5–5.1)
Sodium: 138 mmol/L (ref 135–145)
TOTAL PROTEIN: 6.8 g/dL (ref 6.5–8.1)

## 2017-06-16 LAB — VITAMIN B12: VITAMIN B 12: 781 pg/mL (ref 180–914)

## 2017-06-16 LAB — LACTATE DEHYDROGENASE: LDH: 171 U/L (ref 98–192)

## 2017-06-16 NOTE — Progress Notes (Signed)
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 Patient states that he has been doing well since last visit. Been active with his grandchildren and he plans to go fishing tomorrow. He states that he's been having some swelling in his legs especially in the right leg more than the left leg for which he takes Lasix at home. He takes 1-2 tablets as necessary based on his degree of swelling. He continues to have intermittent diarrhea which he's had chronically for years. He has no new complaints today. He denies any bleeding including melena, hematochezia, hematuria. He bruises easily due to the warfarin that he is taking.  REVIEW OF SYSTEMS:   Review of Systems  HENT: Negative for ear discharge and ear pain.   Eyes: Negative.   Respiratory: Negative.  Negative for hemoptysis.   Cardiovascular: Positive for leg swelling.  Gastrointestinal: Positive for diarrhea. Negative for abdominal pain and blood in stool.  Genitourinary: Negative.  Negative for hematuria.  Musculoskeletal: Negative.   Skin: Negative.   Neurological: Negative for weakness and headaches.  Endo/Heme/Allergies: Does not bruise/bleed easily.  Psychiatric/Behavioral: Negative.   All other systems reviewed and are negative.   As per HPI. Otherwise, a complete review of systems is negatve.  PAST MEDICAL HISTORY: Past Medical History:  Diagnosis Date  . Anemia   . Anxiety   . Aortic stenosis    Moderate  . Arthritis   . Atrial fibrillation ([place])   . Chronic systolic heart failure (HCC)    LVEF 40-45%  . CKD (chronic kidney disease) stage 3, GFR 30-59 ml/min   . Colon cancer ([place])   . Colon polyps 12/2005   Per colonoscopy  . COPD (chronic obstructive pulmonary disease) ([place])   . Coronary atherosclerosis of native coronary artery    Multivessel status post CABG  . Emphysema   . Gastric erosions 12/2005   Per EGD - Dr. [name]  . GERD (gastroesophageal reflux disease)   . Hypothyroidism   . Ischemic cardiomyopathy   . Mitral regurgitation    Moderate  . Myocardial infarction [facility])    Remote  . Psoriasis   . Pulmonary nodules     PAST SURGICAL HISTORY: Past Surgical History:  Procedure Laterality Date  . COLECTOMY N/A 12/13/2013   Procedure: TOTAL COLECTOMY;  Surgeon: [name], MD;  Location: [facility] ORS;  Service: General;  Laterality: N/A;  . CORONARY ARTERY BYPASS GRAFT  March 2004   LIMA to LAD, SVG to diagonal, SVG to OM 2, SVG to RCA  . FLEXIBLE SIGMOIDOSCOPY  10/25/2012   Procedure: FLEXIBLE SIGMOIDOSCOPY;  Surgeon: [name], MD;  Location: [facility];  Service: Gastroenterology;  Laterality: N/A;  . PARTIAL COLECTOMY  10/26/2012   Procedure: PARTIAL COLECTOMY;  Surgeon: [name], MD;  Location: [facility] ORS;  Service: General;  Laterality: N/A;  Closure of Colovesical Fistula    FAMILY HISTORY Family History  Problem Relation Age of Onset  . Cancer Brother   . Heart Problems Mother        unknown  . Hypertension Other      ADVANCED DIRECTIVES: Patient does not have any living will or healthcare power of attorney.  Information was given .  Available resources had been discussed.  We will follow-up on subsequent appointments regarding this issue   HEALTH MAINTENANCE: Social History  Substance Use Topics  . Smoking status: Former Smoker    Packs/day: 1.00    Years: 30.00    Types: Cigarettes    Quit date: 03/17/2003  . Smokeless tobacco: Never Used  . Alcohol use No     Comment: no alcohol use since '04     Colonoscopy: 10/25/2012  PAP: N/A  Bone density: Never  Lipid panel: 08/09/2015  No Known Allergies  Current Outpatient Prescriptions  Medication  Sig Dispense Refill  . albuterol (PROVENTIL) (2.5 MG/3ML) 0.083% nebulizer solution Inhale 3 mLs into the lungs every 4 (four) hours as needed for wheezing or shortness of breath.     . carvedilol (COREG) 6.25 MG tablet Take 1 tablet (6.25 mg total) by mouth 2 (two) times daily with a meal. 60 tablet 11  . diltiazem (CARDIZEM CD) 120 MG 24 hr capsule TAKE (1) CAPSULE BY MOUTH TWICE DAILY. 60 capsule 0  . furosemide (LASIX) 20 MG tablet Take 1 tablet (20 mg total) by mouth daily. 90 tablet 3  . Guaifenesin (MUCINEX MAXIMUM STRENGTH) 1200 MG TB12 Take 1 tablet by mouth daily as needed (for congestion).    [name] HYDROcodone-acetaminophen (NORCO) 5-325 MG per tablet Take 1-2 tablets by mouth every 4 (four) hours as needed. (Patient taking differently: Take 1 tablet by mouth every 6 (six) hours as needed for moderate pain. ) 40 tablet 0  . Ipratropium-Albuterol (COMBIVENT RESPIMAT) 20-100 MCG/ACT AERS respimat Inhale 1 puff into the lungs as needed. (Patient taking differently: Inhale 1 puff into the lungs every 6 (six) hours as needed for wheezing or shortness of breath. ) 1 Inhaler 1  . loperamide (IMODIUM) 2 MG capsule Take 1 capsule (2 mg total) by mouth as needed for diarrhea or loose stools. (Patient taking differently: Take 2-4 mg by mouth as needed for diarrhea or loose stools. ) 20 capsule 2  . potassium chloride SA (K-DUR,KLOR-CON) 20 MEQ tablet Take 0.5 tablets (10 mEq total) by mouth daily. 90 tablet 3  . vitamin B-12 (CYANOCOBALAMIN) 1000 MCG tablet Take 1 tablet (1,000 mcg total) by mouth daily. 60 tablet 2  . warfarin (COUMADIN) 5 MG tablet Take 1/2 tablet daily except 1 tablet on Tuesdays and Fridays (Patient taking differently: Take 2.5-5 mg by mouth See admin instructions. Take 1/2 tablet daily except 1 tablet on Tuesdays and Fridays) 30 tablet 3   No current facility-administered medications for this visit.     Vitals:   06/16/17 1347  BP: (!) 129/56  Pulse: 74  Resp: 16  SpO2: 96%    Filed Weights   06/16/17 1347  Weight: 160 lb 9.6 oz (72.8 kg)      OBJECTIVE: Physical Exam  Constitutional: He is oriented to person, place, and time and well-developed, well-nourished, and in no distress. No distress.  HENT:  Head: Normocephalic and atraumatic.  Mouth/Throat: No oropharyngeal exudate.  Eyes: Pupils are equal, round, and reactive to light. Conjunctivae and EOM are normal. No scleral icterus.  Neck: Normal range of motion. Neck supple. No JVD present.  Cardiovascular: Normal rate, regular rhythm and normal heart sounds.  Exam reveals no gallop and no friction rub.   No murmur heard. Pulmonary/Chest: Effort normal and breath sounds normal. No respiratory distress. He has no wheezes. He has no rales.  Abdominal: Soft. Bowel sounds are normal. He exhibits no distension.  There is no tenderness. There is no guarding.  Musculoskeletal: Normal range of motion. He exhibits no edema (R>L) or tenderness.  Lymphadenopathy:    He has no cervical adenopathy.  Neurological: He is alert and oriented to person, place, and time. No cranial nerve deficit.  Skin: Skin is warm and dry. No rash noted. No erythema. No pallor.  Some bruising in his left bicep region. Chronic darkening of skin on forearms and shins.  Psychiatric: Affect and judgment normal.  Nursing note and vitals reviewed.     ECOG FS:2 - Symptomatic, <50% confined to bed  LAB RESULTS:  Appointment on 06/16/2017  Component Date Value Ref Range Status  . WBC 06/16/2017 4.8  4.0 - 10.5 K/uL Final  . RBC 06/16/2017 3.99* 4.22 - 5.81 MIL/uL Final  . Hemoglobin 06/16/2017 12.7* 13.0 - 17.0 g/dL Final  . HCT 06/16/2017 38.5* 39.0 - 52.0 % Final  . MCV 06/16/2017 96.5  78.0 - 100.0 fL Final  . MCH 06/16/2017 31.8  26.0 - 34.0 pg Final  . MCHC 06/16/2017 33.0  30.0 - 36.0 g/dL Final  . RDW 06/16/2017 14.5  11.5 - 15.5 % Final  . Platelets 06/16/2017 54* 150 - 400 K/uL Final   Comment: SPECIMEN CHECKED FOR  CLOTS CONSISTENT WITH PREVIOUS RESULT   . Neutrophils Relative % 06/16/2017 62  % Final  . Neutro Abs 06/16/2017 3.0  1.7 - 7.7 K/uL Final  . Lymphocytes Relative 06/16/2017 23  % Final  . Lymphs Abs 06/16/2017 1.1  0.7 - 4.0 K/uL Final  . Monocytes Relative 06/16/2017 10  % Final  . Monocytes Absolute 06/16/2017 0.5  0.1 - 1.0 K/uL Final  . Eosinophils Relative 06/16/2017 5  % Final  . Eosinophils Absolute 06/16/2017 0.2  0.0 - 0.7 K/uL Final  . Basophils Relative 06/16/2017 1  % Final  . Basophils Absolute 06/16/2017 0.0  0.0 - 0.1 K/uL Final  . Sodium 06/16/2017 138  135 - 145 mmol/L Final  . Potassium 06/16/2017 3.8  3.5 - 5.1 mmol/L Final  . Chloride 06/16/2017 105  101 - 111 mmol/L Final  . CO2 06/16/2017 26  22 - 32 mmol/L Final  . Glucose, Bld 06/16/2017 98  65 - 99 mg/dL Final  . BUN 06/16/2017 18  6 - 20 mg/dL Final  . Creatinine, Ser 06/16/2017 1.29* 0.61 - 1.24 mg/dL Final  . Calcium 06/16/2017 8.8* 8.9 - 10.3 mg/dL Final  . Total Protein 06/16/2017 6.8  6.5 - 8.1 g/dL Final  . Albumin 06/16/2017 3.9  3.5 - 5.0 g/dL Final  . AST 06/16/2017 17  15 - 41 U/L Final  . ALT 06/16/2017 10* 17 - 63 U/L Final  . Alkaline Phosphatase 06/16/2017 80  38 - 126 U/L Final  . Total Bilirubin 06/16/2017 1.4* 0.3 - 1.2 mg/dL Final  . GFR calc non Af Amer 06/16/2017 51* >60 mL/min Final  . GFR calc Af Amer 06/16/2017 60* >60 mL/min Final   Comment: (NOTE) The eGFR has been calculated using the CKD EPI equation. This calculation has not been validated in all clinical situations. eGFR's persistently <60 mL/min signify possible Chronic Kidney Disease.   . Anion gap 06/16/2017 7  5 - 15 Final  . LDH 06/16/2017 171  98 - 192 U/L Final   CBC Latest Ref Rng & Units 06/16/2017 05/20/2017 03/16/2017  WBC 4.0 - 10.5 K/uL 4.8 4.5 5.0  Hemoglobin 13.0 - 17.0 g/dL 12.7(L) 12.8(L) 13.1  Hematocrit 39.0 - 52.0 % 38.5(L) 38.7(L)  40.8  Platelets 150 - 400 K/uL 54(L) 58(L) 65(L)     RADIOLOGY: I have  reviewed the images below and agree with the reported results  CHEST  2 VIEW 02/03/2017  IMPRESSION: 1. Mild edema and bilateral effusions, right greater than left. Findings are compatible with congestive heart failure. 2. Bibasilar airspace disease likely reflects atelectasis. Lobar pneumonia is not excluded.  CT HEAD WITHOUT CONTRAST 02/03/2017  IMPRESSION: Chronic changes without acute abnormality.  PATHOLOGY: FINAL DIAGNOSIS Diagnosis Bone Marrow, Aspirate,Biopsy, and Clot, right iliac - SLIGHTLY HYPERCELLULAR BONE MARROW FOR AGE WITH TRILINEAGE HEMATOPOIESIS. - SLIGHT PLASMACYTOSIS (PLASMA CELLS 6%). - SEE COMMENT. PERIPHERAL BLOOD: - THROMBOCYTOPENIA.  Diagnosis Note The bone marrow is slightly hypercellular for age with trilineage hematopoiesis including abundant megakaryocytes displaying normal morphology. In the presence of thrombocytopenia, the bone marrow findings likely represent secondary changes due to peripheral platelet destruction, sequestration or consumption. In this background, there is slight plasmacytosis present (plasma cells 6%). Immunohistochemical stains show that the plasma cells display kappa light chain excess. The findings are limited but most suggestive of early involvement by plasma cell dyscrasia/neoplasm. 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 Reviewed his CBC with him. - His bone marrow bx demonstrated that his bone marrow was functioning normally in producing abundant megakaryocytes.  - Continue to monitor platelets. Repeat CBC on next visit. - B12 on the low ends of normal. Continue oral B12 supplement at this time. B12 level pending for today.   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  Last SPEP/IFE 3 months ago was negative for monoclonal paraprotein. Kappa/lambda light chain ratio normal at 0.75. Betamicroglobulin was elevated however this may be due to his underlying renal disease. -Repeat his labs in 4 months.  RTC in 4 months with labs.  Orders Placed This Encounter  Procedures  . CBC with Differential    Standing Status:   Future    Standing Expiration Date:   06/16/2018  . Comprehensive metabolic panel    Standing Status:   Future    Standing Expiration Date:   06/16/2018  . Multiple Myeloma Panel (SPEP&IFE w/QIG)    Standing Status:   Future    Standing Expiration Date:   06/16/2018  . Kappa/lambda light chains    Standing Status:   Future    Standing Expiration Date:   06/16/2018  . Vitamin B12    Standing Status:   Future    Standing Expiration Date:   06/16/2018    Twana First, MD   06/16/2017 1:48 PM

## 2017-06-17 LAB — KAPPA/LAMBDA LIGHT CHAINS
Kappa free light chain: 31.9 mg/L — ABNORMAL HIGH (ref 3.3–19.4)
Kappa, lambda light chain ratio: 0.91 (ref 0.26–1.65)
Lambda free light chains: 35.1 mg/L — ABNORMAL HIGH (ref 5.7–26.3)

## 2017-06-17 LAB — BETA 2 MICROGLOBULIN, SERUM: Beta-2 Microglobulin: 3.5 mg/L — ABNORMAL HIGH (ref 0.6–2.4)

## 2017-06-17 NOTE — Progress Notes (Signed)
Please call patient and let him know his b12 level is going up. Its 781 now. He is to continue taking his b12 at current dose. TY

## 2017-06-18 LAB — MULTIPLE MYELOMA PANEL, SERUM
ALBUMIN SERPL ELPH-MCNC: 3.5 g/dL (ref 2.9–4.4)
ALPHA 1: 0.3 g/dL (ref 0.0–0.4)
ALPHA2 GLOB SERPL ELPH-MCNC: 0.6 g/dL (ref 0.4–1.0)
Albumin/Glob SerPl: 1.2 (ref 0.7–1.7)
B-Globulin SerPl Elph-Mcnc: 1 g/dL (ref 0.7–1.3)
GAMMA GLOB SERPL ELPH-MCNC: 1.2 g/dL (ref 0.4–1.8)
Globulin, Total: 3.1 g/dL (ref 2.2–3.9)
IGG (IMMUNOGLOBIN G), SERUM: 1180 mg/dL (ref 700–1600)
IgA: 254 mg/dL (ref 61–437)
IgM (Immunoglobulin M), Srm: 75 mg/dL (ref 15–143)
Total Protein ELP: 6.6 g/dL (ref 6.0–8.5)

## 2017-06-23 ENCOUNTER — Ambulatory Visit (INDEPENDENT_AMBULATORY_CARE_PROVIDER_SITE_OTHER): Payer: Medicare Other | Admitting: *Deleted

## 2017-06-23 DIAGNOSIS — I4891 Unspecified atrial fibrillation: Secondary | ICD-10-CM

## 2017-06-23 DIAGNOSIS — I480 Paroxysmal atrial fibrillation: Secondary | ICD-10-CM

## 2017-06-23 DIAGNOSIS — Z5181 Encounter for therapeutic drug level monitoring: Secondary | ICD-10-CM | POA: Diagnosis not present

## 2017-06-23 LAB — POCT INR: INR: 2.6

## 2017-06-23 NOTE — Progress Notes (Signed)
That's fine TY

## 2017-06-29 ENCOUNTER — Other Ambulatory Visit: Payer: Self-pay | Admitting: Cardiology

## 2017-07-12 ENCOUNTER — Other Ambulatory Visit: Payer: Self-pay | Admitting: Cardiology

## 2017-07-21 ENCOUNTER — Ambulatory Visit (INDEPENDENT_AMBULATORY_CARE_PROVIDER_SITE_OTHER): Payer: Medicare Other | Admitting: *Deleted

## 2017-07-21 DIAGNOSIS — I480 Paroxysmal atrial fibrillation: Secondary | ICD-10-CM | POA: Diagnosis not present

## 2017-07-21 DIAGNOSIS — Z5181 Encounter for therapeutic drug level monitoring: Secondary | ICD-10-CM | POA: Diagnosis not present

## 2017-07-21 LAB — POCT INR: INR: 2.4

## 2017-08-10 ENCOUNTER — Other Ambulatory Visit: Payer: Self-pay | Admitting: Cardiology

## 2017-08-18 ENCOUNTER — Ambulatory Visit (INDEPENDENT_AMBULATORY_CARE_PROVIDER_SITE_OTHER): Payer: Medicare Other | Admitting: *Deleted

## 2017-08-18 DIAGNOSIS — I480 Paroxysmal atrial fibrillation: Secondary | ICD-10-CM

## 2017-08-18 DIAGNOSIS — Z5181 Encounter for therapeutic drug level monitoring: Secondary | ICD-10-CM

## 2017-08-18 LAB — POCT INR: INR: 2.1

## 2017-08-24 NOTE — Progress Notes (Signed)
Cardiology Office Note  Date: 08/25/2017   ID: Dinari, Stgermaine 11-Jul-1941, MRN [id number]  PCP: [name], MD  Primary Cardiologist: Rozann Lesches, MD   Chief Complaint  Patient presents with  . Atrial Fibrillation  . Cardiomyopathy    History of Present Illness: Cory Ryan is a 76 y.o. male last seen in August by Ms. West Pugh.  He presents today with his son for a follow-up visit.  Reports no chest pain or palpitations on current medical regimen.  With typical activities he has NYHA class II dyspnea.  He continues on Coumadin with follow-up in the anticoagulation clinic.  He reports no bleeding problems.  Recent INR was 2.1.  Echocardiogram from January 2017 revealed LVEF 40-45% with at least mild aortic stenosis, moderate mitral regurgitation, significant right ventricular dysfunction and pulmonary hypertension with PASP 63 mmHg.  Current cardiac regimen includes Coumadin, Lasix, potassium supplements, Coreg, and Cardizem CD.  Past Medical History:  Diagnosis Date  . Anemia   . Anxiety   . Aortic stenosis    Moderate  . Arthritis   . Atrial fibrillation ([place])   . Chronic systolic heart failure (HCC)    LVEF 40-45%  . CKD (chronic kidney disease) stage 3, GFR 30-59 ml/min (HCC)   . Colon cancer ([place])   . Colon polyps 12/2005   Per colonoscopy  . COPD (chronic obstructive pulmonary disease) ([place])   . Coronary atherosclerosis of native coronary artery    Multivessel status post CABG  . Emphysema   . Gastric erosions 12/2005   Per EGD - Dr. [name]  . GERD (gastroesophageal reflux disease)   . Hypothyroidism   . Ischemic cardiomyopathy   . Mitral regurgitation    Moderate  . Myocardial infarction [facility])    Remote  . Psoriasis   . Pulmonary nodules     Past Surgical History:  Procedure Laterality Date  . CORONARY ARTERY BYPASS GRAFT  March 2004   LIMA to LAD, SVG to diagonal, SVG to OM 2, SVG to RCA    Current Outpatient Medications    Medication Sig Dispense Refill  . albuterol (PROVENTIL) (2.5 MG/3ML) 0.083% nebulizer solution Inhale 3 mLs into the lungs every 4 (four) hours as needed for wheezing or shortness of breath.     . carvedilol (COREG) 6.25 MG tablet TAKE (1) TABLET BY MOUTH TWICE DAILY WTIH A MEAL. 60 tablet 6  . diltiazem (CARDIZEM CD) 120 MG 24 hr capsule TAKE (1) CAPSULE BY MOUTH TWICE DAILY. 60 capsule 0  . furosemide (LASIX) 20 MG tablet Take 20 mg by mouth daily.    . Guaifenesin (MUCINEX MAXIMUM STRENGTH) 1200 MG TB12 Take 1 tablet by mouth daily as needed (for congestion).    [name] HYDROcodone-acetaminophen (NORCO) 5-325 MG per tablet Take 1-2 tablets by mouth every 4 (four) hours as needed. (Patient taking differently: Take 1 tablet by mouth every 6 (six) hours as needed for moderate pain. ) 40 tablet 0  . Ipratropium-Albuterol (COMBIVENT RESPIMAT) 20-100 MCG/ACT AERS respimat Inhale 1 puff into the lungs as needed. (Patient taking differently: Inhale 1 puff into the lungs every 6 (six) hours as needed for wheezing or shortness of breath. ) 1 Inhaler 1  . loperamide (IMODIUM) 2 MG capsule Take 1 capsule (2 mg total) by mouth as needed for diarrhea or loose stools. (Patient taking differently: Take 2-4 mg by mouth as needed for diarrhea or loose stools. ) 20 capsule 2  . potassium chloride SA (K-DUR,KLOR-CON) 20  MEQ tablet Take 0.5 tablets (10 mEq total) by mouth daily. 90 tablet 3  . vitamin B-12 (CYANOCOBALAMIN) 1000 MCG tablet Take 1 tablet (1,000 mcg total) by mouth daily. 60 tablet 2  . warfarin (COUMADIN) 5 MG tablet Take 1/2 tablet daily except 1 tablet on Mondays, Wednesdays and Fridays 30 tablet 4   No current facility-administered medications for this visit.    Allergies:  Patient has no known allergies.   Social History: The patient  reports that he quit smoking about 14 years ago. His smoking use included cigarettes. He has a 30.00 pack-year smoking history. he has never used smokeless tobacco. He  reports that he does not drink alcohol or use drugs.   ROS:  Please see the history of present illness. Otherwise, complete review of systems is positive for hearing loss, arthritic pains.  All other systems are reviewed and negative.   Physical Exam: VS:  BP 112/64 (BP Location: Left Arm)   Pulse (!) 58   Ht 5\' 10"  (1.778 m)   Wt 165 lb (74.8 kg)   SpO2 94%   BMI 23.68 kg/m , BMI Body mass index is 23.68 kg/m.  Wt Readings from Last 3 Encounters:  08/25/17 165 lb (74.8 kg)  06/16/17 160 lb 9.6 oz (72.8 kg)  05/20/17 162 lb (73.5 kg)    General: Elderly male, appears comfortable at rest. HEENT: Conjunctiva and lids normal, oropharynx clear. Neck: Supple, no elevated JVP or carotid bruits, no thyromegaly. Lungs: Decreased breath sounds without wheezing, nonlabored breathing at rest. Cardiac: Irregularly irregular, no S3, 2/6 systolic murmur, no pericardial rub. Abdomen: Soft, nontender, bowel sounds present, no guarding or rebound. Extremities: Venous stasis and mild leg edema, distal pulses 2+. Skin: Warm and dry. Musculoskeletal: No kyphosis. Neuropsychiatric: Alert and oriented x3, affect grossly appropriate.  ECG: I personally reviewed the tracing from 02/09/2017 which showed rate controlled atrial fibrillation with diffuse repolarization abnormalities.  Recent Labwork: 02/09/2017: B Natriuretic Peptide 880.0 06/16/2017: ALT 10; AST 17; BUN 18; Creatinine, Ser 1.29; Hemoglobin 12.7; Platelets 54; Potassium 3.8; Sodium 138   Other Studies Reviewed Today:  Echocardiogram 10/23/2015: Study Conclusions  - Left ventricle: The cavity size was normal. Wall thickness was   increased in a pattern of mild LVH. Systolic function was mildly   to moderately reduced. The estimated ejection fraction was in the   range of 40% to 45%. The study was not technically sufficient to   allow evaluation of LV diastolic dysfunction due to atrial   fibrillation. - Regional wall motion abnormality:  Akinesis of the basal   inferoseptal, basal-mid inferior, and mid inferolateral   myocardium; severe hypokinesis of the mid inferoseptal   myocardium; moderate hypokinesis of the mid anteroseptal and   basal anterolateral myocardium; mild hypokinesis of the mid   anterior and mid anterolateral myocardium. - Aortic valve: Morphologically, there was at least mild aortic   stenosis. Moderately calcified annulus. Mildly thickened,   moderately calcified leaflets. There was trivial regurgitation. - Mitral valve: Calcified annulus. Normal thickness leaflets .   There was moderate regurgitation. - Left atrium: The atrium was moderately to severely dilated. - Right ventricle: Systolic function was moderately to severely   reduced. - Right atrium: The atrium was mildly to moderately dilated. - Atrial septum: No defect or patent foramen ovale was identified. - Tricuspid valve: There was moderate regurgitation. - Pulmonic valve: There was mild regurgitation. - Pulmonary arteries: Systolic pressure was severely increased. PA   peak pressure: 63 mm Hg (  S). - Inferior vena cava: Estimated CVP 15 mmHg.  Assessment and Plan:  1.  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  Weight is down a few pounds, he continues on relatively low-dose Lasix, intensifying dose as needed for weight change and leg swelling.  2.  Mild to moderate aortic stenosis as well as moderate mitral regurgitation.  Echocardiogram from last year as noted above.  No change on examination.  3.  Chronic atrial fibrillation, heart rate control is adequate on current regimen.  He continues on Coumadin with follow-up in the anticoagulation clinic.  4.  Multivessel CAD status post CABG.  Continue medical therapy and observation in the absence of progressive angina symptoms.  Current medicines were reviewed with the patient today.   Disposition: Follow-up in 6 months.  Signed, [name], MD, [facility] 08/25/2017 11:18 AM     [facility] [address], [place], [place] [address] Phone: [phone]; Fax: [phone]

## 2017-08-25 ENCOUNTER — Ambulatory Visit: Payer: Medicare Other | Admitting: Cardiology

## 2017-08-25 ENCOUNTER — Encounter: Payer: Self-pay | Admitting: Cardiology

## 2017-08-25 VITALS — BP 112/64 | HR 58 | Ht 70.0 in | Wt 165.0 lb

## 2017-08-25 DIAGNOSIS — I4819 Other persistent atrial fibrillation: Secondary | ICD-10-CM

## 2017-08-25 DIAGNOSIS — I481 Persistent atrial fibrillation: Secondary | ICD-10-CM | POA: Diagnosis not present

## 2017-08-25 DIAGNOSIS — I2510 Atherosclerotic heart disease of native coronary artery without angina pectoris: Secondary | ICD-10-CM

## 2017-08-25 DIAGNOSIS — I5042 Chronic combined systolic (congestive) and diastolic (congestive) heart failure: Secondary | ICD-10-CM | POA: Diagnosis not present

## 2017-08-25 DIAGNOSIS — I38 Endocarditis, valve unspecified: Secondary | ICD-10-CM

## 2017-08-25 NOTE — Patient Instructions (Signed)

## 2017-09-01 ENCOUNTER — Other Ambulatory Visit: Payer: Self-pay | Admitting: Cardiology

## 2017-09-14 ENCOUNTER — Other Ambulatory Visit: Payer: Self-pay | Admitting: Adult Health

## 2017-09-29 ENCOUNTER — Ambulatory Visit (INDEPENDENT_AMBULATORY_CARE_PROVIDER_SITE_OTHER): Payer: Medicare Other | Admitting: *Deleted

## 2017-09-29 DIAGNOSIS — I482 Chronic atrial fibrillation, unspecified: Secondary | ICD-10-CM

## 2017-09-29 DIAGNOSIS — Z5181 Encounter for therapeutic drug level monitoring: Secondary | ICD-10-CM

## 2017-09-29 LAB — POCT INR: INR: 1.8

## 2017-10-15 ENCOUNTER — Other Ambulatory Visit (HOSPITAL_COMMUNITY): Payer: Medicare Other

## 2017-10-19 ENCOUNTER — Inpatient Hospital Stay (HOSPITAL_COMMUNITY): Payer: Medicare Other | Attending: Oncology

## 2017-10-19 DIAGNOSIS — J449 Chronic obstructive pulmonary disease, unspecified: Secondary | ICD-10-CM | POA: Diagnosis not present

## 2017-10-19 DIAGNOSIS — Z7901 Long term (current) use of anticoagulants: Secondary | ICD-10-CM | POA: Diagnosis not present

## 2017-10-19 DIAGNOSIS — D72822 Plasmacytosis: Secondary | ICD-10-CM | POA: Diagnosis not present

## 2017-10-19 DIAGNOSIS — R233 Spontaneous ecchymoses: Secondary | ICD-10-CM | POA: Diagnosis not present

## 2017-10-19 DIAGNOSIS — J439 Emphysema, unspecified: Secondary | ICD-10-CM | POA: Insufficient documentation

## 2017-10-19 DIAGNOSIS — I4891 Unspecified atrial fibrillation: Secondary | ICD-10-CM | POA: Diagnosis not present

## 2017-10-19 DIAGNOSIS — D696 Thrombocytopenia, unspecified: Secondary | ICD-10-CM | POA: Diagnosis not present

## 2017-10-19 DIAGNOSIS — D472 Monoclonal gammopathy: Secondary | ICD-10-CM | POA: Insufficient documentation

## 2017-10-19 DIAGNOSIS — Z87891 Personal history of nicotine dependence: Secondary | ICD-10-CM | POA: Insufficient documentation

## 2017-10-19 LAB — CBC WITH DIFFERENTIAL/PLATELET
BASOS ABS: 0.1 10*3/uL (ref 0.0–0.1)
Basophils Relative: 1 %
EOS ABS: 0.2 10*3/uL (ref 0.0–0.7)
Eosinophils Relative: 3 %
HCT: 41.2 % (ref 39.0–52.0)
Hemoglobin: 12.9 g/dL — ABNORMAL LOW (ref 13.0–17.0)
LYMPHS ABS: 1.1 10*3/uL (ref 0.7–4.0)
Lymphocytes Relative: 19 %
MCH: 30.9 pg (ref 26.0–34.0)
MCHC: 31.3 g/dL (ref 30.0–36.0)
MCV: 98.6 fL (ref 78.0–100.0)
MONO ABS: 0.6 10*3/uL (ref 0.1–1.0)
MONOS PCT: 11 %
NEUTROS ABS: 3.6 10*3/uL (ref 1.7–7.7)
Neutrophils Relative %: 66 %
PLATELETS: 74 10*3/uL — AB (ref 150–400)
RBC: 4.18 MIL/uL — ABNORMAL LOW (ref 4.22–5.81)
RDW: 14.6 % (ref 11.5–15.5)
WBC: 5.6 10*3/uL (ref 4.0–10.5)

## 2017-10-19 LAB — COMPREHENSIVE METABOLIC PANEL
ALT: 12 U/L — ABNORMAL LOW (ref 17–63)
AST: 17 U/L (ref 15–41)
Albumin: 3.7 g/dL (ref 3.5–5.0)
Alkaline Phosphatase: 82 U/L (ref 38–126)
Anion gap: 8 (ref 5–15)
BUN: 16 mg/dL (ref 6–20)
CHLORIDE: 102 mmol/L (ref 101–111)
CO2: 26 mmol/L (ref 22–32)
Calcium: 8.8 mg/dL — ABNORMAL LOW (ref 8.9–10.3)
Creatinine, Ser: 1.26 mg/dL — ABNORMAL HIGH (ref 0.61–1.24)
GFR calc Af Amer: >60 mL/min (ref >60)
GFR, EST NON AFRICAN AMERICAN: 53 mL/min — AB (ref >60)
Glucose, Bld: 107 mg/dL — ABNORMAL HIGH (ref 65–99)
POTASSIUM: 4.3 mmol/L (ref 3.5–5.1)
Sodium: 136 mmol/L (ref 135–145)
Total Bilirubin: 1.1 mg/dL (ref 0.3–1.2)
Total Protein: 7.1 g/dL (ref 6.5–8.1)

## 2017-10-19 LAB — VITAMIN B12: VITAMIN B 12: 913 pg/mL (ref 180–914)

## 2017-10-20 LAB — KAPPA/LAMBDA LIGHT CHAINS
Kappa free light chain: 40.9 mg/L — ABNORMAL HIGH (ref 3.3–19.4)
Kappa, lambda light chain ratio: 1.24 (ref 0.26–1.65)
Lambda free light chains: 33.1 mg/L — ABNORMAL HIGH (ref 5.7–26.3)

## 2017-10-21 LAB — MULTIPLE MYELOMA PANEL, SERUM
ALBUMIN SERPL ELPH-MCNC: 3.5 g/dL (ref 2.9–4.4)
ALPHA 1: 0.3 g/dL (ref 0.0–0.4)
ALPHA2 GLOB SERPL ELPH-MCNC: 0.7 g/dL (ref 0.4–1.0)
Albumin/Glob SerPl: 1.1 (ref 0.7–1.7)
B-GLOBULIN SERPL ELPH-MCNC: 1 g/dL (ref 0.7–1.3)
Gamma Glob SerPl Elph-Mcnc: 1.2 g/dL (ref 0.4–1.8)
Globulin, Total: 3.2 g/dL (ref 2.2–3.9)
IGG (IMMUNOGLOBIN G), SERUM: 1143 mg/dL (ref 700–1600)
IgA: 309 mg/dL (ref 61–437)
IgM (Immunoglobulin M), Srm: 154 mg/dL — ABNORMAL HIGH (ref 15–143)
TOTAL PROTEIN ELP: 6.7 g/dL (ref 6.0–8.5)

## 2017-10-22 ENCOUNTER — Inpatient Hospital Stay (HOSPITAL_COMMUNITY): Payer: Medicare Other | Admitting: Oncology

## 2017-10-22 ENCOUNTER — Other Ambulatory Visit: Payer: Self-pay

## 2017-10-22 ENCOUNTER — Encounter (HOSPITAL_COMMUNITY): Payer: Self-pay | Admitting: Oncology

## 2017-10-22 VITALS — BP 118/71 | HR 82 | Temp 97.4°F | Resp 18 | Ht 70.0 in | Wt 166.4 lb

## 2017-10-22 DIAGNOSIS — J449 Chronic obstructive pulmonary disease, unspecified: Secondary | ICD-10-CM

## 2017-10-22 DIAGNOSIS — D696 Thrombocytopenia, unspecified: Secondary | ICD-10-CM

## 2017-10-22 DIAGNOSIS — R233 Spontaneous ecchymoses: Secondary | ICD-10-CM

## 2017-10-22 DIAGNOSIS — I4891 Unspecified atrial fibrillation: Secondary | ICD-10-CM | POA: Diagnosis not present

## 2017-10-22 DIAGNOSIS — Z87891 Personal history of nicotine dependence: Secondary | ICD-10-CM

## 2017-10-22 DIAGNOSIS — D72822 Plasmacytosis: Secondary | ICD-10-CM

## 2017-10-22 DIAGNOSIS — J439 Emphysema, unspecified: Secondary | ICD-10-CM

## 2017-10-22 DIAGNOSIS — D472 Monoclonal gammopathy: Secondary | ICD-10-CM | POA: Diagnosis not present

## 2017-10-22 DIAGNOSIS — Z7901 Long term (current) use of anticoagulants: Secondary | ICD-10-CM | POA: Diagnosis not present

## 2017-10-22 NOTE — Progress Notes (Signed)
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  Patient states that he is doing well since his last visit.  He continues to be active with his grandchildren and he plans to go fishing as soon as he gets warmer.  He complains of no pain today.  He endorses a chronic cough but states this is from his emphysema COPD.  He complains of mild leg swelling that is relieved with rest and elevation.  He states he bruises easily but this is not new.  Appetite 75%, energy level 75%.  Overall is doing well. He denies any bleeding including melena, hematochezia, hematuria. He bruises easily due to the warfarin that he is taking.  REVIEW OF SYSTEMS:   Review of Systems  HENT: Negative for ear discharge and ear pain.   Eyes: Negative.   Respiratory: Positive for cough. Negative for hemoptysis.        Related to COPD/emphysema.  Some sputum production.  Managed by PCP.  Cardiovascular: Positive for leg swelling.  Gastrointestinal: Negative for abdominal pain, blood in stool and diarrhea.  Genitourinary: Negative.  Negative for hematuria.  Musculoskeletal: Negative.   Skin: Negative.   Neurological: Negative for weakness and headaches.  Endo/Heme/Allergies: Bruises/bleeds easily.  Psychiatric/Behavioral: Negative.   All other systems reviewed and are negative.   As per HPI. Otherwise, a complete review of systems is negatve.  PAST MEDICAL HISTORY: Past Medical History:  Diagnosis Date  . Anemia   . Anxiety   . Aortic stenosis    Moderate  . Arthritis   . Atrial fibrillation ([place])   . Chronic systolic heart failure (HCC)    LVEF 40-45%  . CKD (chronic kidney disease) stage 3, GFR  30-59 ml/min (HCC)   . Colon cancer ([place])   . Colon polyps 12/2005   Per colonoscopy  . COPD (chronic obstructive pulmonary disease) ([place])   . Coronary atherosclerosis of native coronary artery    Multivessel status post CABG  . Emphysema   . Gastric erosions 12/2005   Per EGD - Dr. [name]  . GERD (gastroesophageal reflux disease)   . Hypothyroidism   . Ischemic cardiomyopathy   . Mitral regurgitation    Moderate  . Myocardial infarction [facility])    Remote  . Psoriasis   . Pulmonary nodules     PAST SURGICAL HISTORY: Past Surgical History:  Procedure Laterality Date  . COLECTOMY N/A 12/13/2013   Procedure: TOTAL COLECTOMY;  Surgeon: [name], MD;  Location: [facility] ORS;  Service: General;  Laterality: N/A;  . CORONARY ARTERY BYPASS GRAFT  March 2004   LIMA to LAD, SVG to diagonal, SVG to OM 2, SVG to RCA  . FLEXIBLE SIGMOIDOSCOPY  10/25/2012   Procedure: FLEXIBLE SIGMOIDOSCOPY;  Surgeon: [name], MD;  Location: [facility] ENDO SUITE;  Service: Gastroenterology;  Laterality: N/A;  . PARTIAL COLECTOMY  10/26/2012   Procedure: PARTIAL COLECTOMY;  Surgeon: [name], MD;  Location: [facility] ORS;  Service: General;  Laterality: N/A;  Closure of Colovesical Fistula    FAMILY HISTORY Family History  Problem Relation Age of Onset  . Cancer Brother   . Heart Problems Mother  unknown  . Hypertension Other      ADVANCED DIRECTIVES: Patient does not have any living will or healthcare power of attorney.  Information was given .  Available resources had been discussed.  We will follow-up on subsequent appointments regarding this issue   HEALTH MAINTENANCE: Social History   Tobacco Use  . Smoking status: Former Smoker    Packs/day: 1.00    Years: 30.00    Pack years: 30.00    Types: Cigarettes    Last attempt to quit: 03/17/2003    Years since quitting: 14.6  . Smokeless tobacco: Never Used  Substance Use Topics  . Alcohol use: No    Alcohol/week: 0.0 oz    Comment: no alcohol  use since '04  . Drug use: No     Colonoscopy: 10/25/2012  PAP: N/A  Bone density: Never  Lipid panel: 08/09/2015  No Known Allergies  Current Outpatient Medications  Medication Sig Dispense Refill  . albuterol (PROVENTIL) (2.5 MG/3ML) 0.083% nebulizer solution Inhale 3 mLs into the lungs every 4 (four) hours as needed for wheezing or shortness of breath.     . carvedilol (COREG) 6.25 MG tablet TAKE (1) TABLET BY MOUTH TWICE DAILY WTIH A MEAL. 60 tablet 6  . diltiazem (CARDIZEM CD) 120 MG 24 hr capsule TAKE (1) CAPSULE BY MOUTH TWICE DAILY. 180 capsule 3  . furosemide (LASIX) 20 MG tablet TAKE (1) TABLET BY MOUTH ONCE DAILY. 90 tablet 3  . Guaifenesin (MUCINEX MAXIMUM STRENGTH) 1200 MG TB12 Take 1 tablet by mouth daily as needed (for congestion).    [name] HYDROcodone-acetaminophen (NORCO) 5-325 MG per tablet Take 1-2 tablets by mouth every 4 (four) hours as needed. (Patient taking differently: Take 1 tablet by mouth every 6 (six) hours as needed for moderate pain. ) 40 tablet 0  . Ipratropium-Albuterol (COMBIVENT RESPIMAT) 20-100 MCG/ACT AERS respimat Inhale 1 puff into the lungs as needed. (Patient taking differently: Inhale 1 puff into the lungs every 6 (six) hours as needed for wheezing or shortness of breath. ) 1 Inhaler 1  . loperamide (IMODIUM) 2 MG capsule Take 1 capsule (2 mg total) by mouth as needed for diarrhea or loose stools. (Patient taking differently: Take 2-4 mg by mouth as needed for diarrhea or loose stools. ) 20 capsule 2  . potassium chloride SA (K-DUR,KLOR-CON) 20 MEQ tablet Take 0.5 tablets (10 mEq total) by mouth daily. 90 tablet 3  . vitamin B-12 (CYANOCOBALAMIN) 1000 MCG tablet Take 1 tablet (1,000 mcg total) by mouth daily. 60 tablet 2  . warfarin (COUMADIN) 5 MG tablet Take 1/2 tablet daily except 1 tablet on Mondays, Wednesdays and Fridays 30 tablet 4   No current facility-administered medications for this visit.     Vitals:   10/22/17 1137  BP: 118/71  Pulse:  82  Resp: 18  Temp: (!) 97.4 F (36.3 C)  SpO2: 94%   Filed Weights   10/22/17 1137  Weight: 166 lb 6.4 oz (75.5 kg)      OBJECTIVE: Physical Exam  Constitutional: He is oriented to person, place, and time and well-developed, well-nourished, and in no distress. No distress.  HENT:  Head: Normocephalic and atraumatic.  Mouth/Throat: No oropharyngeal exudate.  Eyes: Conjunctivae and EOM are normal. Pupils are equal, round, and reactive to light. No scleral icterus.  Neck: Normal range of motion. Neck supple. No JVD present.  Cardiovascular: Normal rate, regular rhythm and normal heart sounds. Exam reveals no gallop and no friction rub.  No murmur  heard. Pulmonary/Chest: Effort normal and breath sounds normal. No respiratory distress. He has no wheezes. He has no rales.  Abdominal: Soft. Bowel sounds are normal. He exhibits no distension. There is no tenderness. There is no guarding.  Musculoskeletal: Normal range of motion. He exhibits no edema (R>L) or tenderness.  +1 pitting edema bilateral lower extremities  Lymphadenopathy:    He has no cervical adenopathy.  Neurological: He is alert and oriented to person, place, and time. No cranial nerve deficit.  Skin: Skin is warm and dry. No rash noted. No erythema. No pallor.  Some bruising in his bilateral hands and wrists region. Chronic darkening of skin on forearms and shins.  Psychiatric: Affect and judgment normal.  Nursing note and vitals reviewed.     ECOG FS:2 - Symptomatic, <50% confined to bed  LAB RESULTS:  Appointment on 10/19/2017  Component Date Value Ref Range Status  . WBC 10/19/2017 5.6  4.0 - 10.5 K/uL Final  . RBC 10/19/2017 4.18* 4.22 - 5.81 MIL/uL Final  . Hemoglobin 10/19/2017 12.9* 13.0 - 17.0 g/dL Final  . HCT 10/19/2017 41.2  39.0 - 52.0 % Final  . MCV 10/19/2017 98.6  78.0 - 100.0 fL Final  . MCH 10/19/2017 30.9  26.0 - 34.0 pg Final  . MCHC 10/19/2017 31.3  30.0 - 36.0 g/dL Final  . RDW 10/19/2017  14.6  11.5 - 15.5 % Final  . Platelets 10/19/2017 74* 150 - 400 K/uL Final   Comment: CONSISTENT WITH PREVIOUS RESULT SPECIMEN CHECKED FOR CLOTS   . Neutrophils Relative % 10/19/2017 66  % Final  . Lymphocytes Relative 10/19/2017 19  % Final  . Monocytes Relative 10/19/2017 11  % Final  . Eosinophils Relative 10/19/2017 3  % Final  . Basophils Relative 10/19/2017 1  % Final  . Neutro Abs 10/19/2017 3.6  1.7 - 7.7 K/uL Final  . Lymphs Abs 10/19/2017 1.1  0.7 - 4.0 K/uL Final  . Monocytes Absolute 10/19/2017 0.6  0.1 - 1.0 K/uL Final  . Eosinophils Absolute 10/19/2017 0.2  0.0 - 0.7 K/uL Final  . Basophils Absolute 10/19/2017 0.1  0.0 - 0.1 K/uL Final  . Sodium 10/19/2017 136  135 - 145 mmol/L Final  . Potassium 10/19/2017 4.3  3.5 - 5.1 mmol/L Final  . Chloride 10/19/2017 102  101 - 111 mmol/L Final  . CO2 10/19/2017 26  22 - 32 mmol/L Final  . Glucose, Bld 10/19/2017 107* 65 - 99 mg/dL Final  . BUN 10/19/2017 16  6 - 20 mg/dL Final  . Creatinine, Ser 10/19/2017 1.26* 0.61 - 1.24 mg/dL Final  . Calcium 10/19/2017 8.8* 8.9 - 10.3 mg/dL Final  . Total Protein 10/19/2017 7.1  6.5 - 8.1 g/dL Final  . Albumin 10/19/2017 3.7  3.5 - 5.0 g/dL Final  . AST 10/19/2017 17  15 - 41 U/L Final  . ALT 10/19/2017 12* 17 - 63 U/L Final  . Alkaline Phosphatase 10/19/2017 82  38 - 126 U/L Final  . Total Bilirubin 10/19/2017 1.1  0.3 - 1.2 mg/dL Final  . GFR calc non Af Amer 10/19/2017 53* >60 mL/min Final  . GFR calc Af Amer 10/19/2017 >60  >60 mL/min Final   Comment: (NOTE) The eGFR has been calculated using the CKD EPI equation. This calculation has not been validated in all clinical situations. eGFR's persistently <60 mL/min signify possible Chronic Kidney Disease.   . Anion gap 10/19/2017 8  5 - 15 Final  . IgG (Immunoglobin G), Serum 10/19/2017  1,143  700 - 1,600 mg/dL Final  . IgA 10/19/2017 309  61 - 437 mg/dL Final  . IgM (Immunoglobulin M), Srm 10/19/2017 154* 15 - 143 mg/dL Final  .  Total Protein ELP 10/19/2017 6.7  6.0 - 8.5 g/dL Corrected  . Albumin SerPl Elph-Mcnc 10/19/2017 3.5  2.9 - 4.4 g/dL Corrected  . Alpha 1 10/19/2017 0.3  0.0 - 0.4 g/dL Corrected  . Alpha2 Glob SerPl Elph-Mcnc 10/19/2017 0.7  0.4 - 1.0 g/dL Corrected  . B-Globulin SerPl Elph-Mcnc 10/19/2017 1.0  0.7 - 1.3 g/dL Corrected  . Gamma Glob SerPl Elph-Mcnc 10/19/2017 1.2  0.4 - 1.8 g/dL Corrected  . M Protein SerPl Elph-Mcnc 10/19/2017 Not Observed  Not Observed g/dL Corrected  . Globulin, Total 10/19/2017 3.2  2.2 - 3.9 g/dL Corrected  . Albumin/Glob SerPl 10/19/2017 1.1  0.7 - 1.7 Corrected  . IFE 1 10/19/2017 Comment   Corrected   An apparent normal immunofixation pattern.  . Please Note 10/19/2017 Comment   Corrected   Comment: (NOTE) Protein electrophoresis scan will follow via computer, mail, or courier delivery. Performed At: [facility] [place], [place] [id number] Rush Farmer MD [id number]   . Kappa free light chain 10/19/2017 40.9* 3.3 - 19.4 mg/L Final  . Lamda free light chains 10/19/2017 33.1* 5.7 - 26.3 mg/L Final  . Kappa, lamda light chain ratio 10/19/2017 1.24  0.26 - 1.65 Final   Comment: (NOTE) Performed At: [facility] [place], [place] [id number] Rush Farmer MD [id number]   . Vitamin B-12 10/19/2017 913  180 - 914 pg/mL Final   Comment: (NOTE) This assay is not validated for testing neonatal or myeloproliferative syndrome specimens for Vitamin B12 levels. Performed at [facility], [place] [address] [place], [place] [address]    CBC Latest Ref Rng & Units 10/19/2017 06/16/2017 05/20/2017  WBC 4.0 - 10.5 K/uL 5.6 4.8 4.5  Hemoglobin 13.0 - 17.0 g/dL 12.9(L) 12.7(L) 12.8(L)  Hematocrit 39.0 - 52.0 % 41.2 38.5(L) 38.7(L)  Platelets 150 - 400 K/uL 74(L) 54(L) 58(L)     RADIOLOGY: I have reviewed the images below and agree with the reported results  CHEST  2 VIEW 02/03/2017  IMPRESSION: 1. Mild edema and  bilateral effusions, right greater than left. Findings are compatible with congestive heart failure. 2. Bibasilar airspace disease likely reflects atelectasis. Lobar pneumonia is not excluded.  CT HEAD WITHOUT CONTRAST 02/03/2017  IMPRESSION: Chronic changes without acute abnormality.  PATHOLOGY: FINAL DIAGNOSIS Diagnosis Bone Marrow, Aspirate,Biopsy, and Clot, right iliac - SLIGHTLY HYPERCELLULAR BONE MARROW FOR AGE WITH TRILINEAGE HEMATOPOIESIS. - SLIGHT PLASMACYTOSIS (PLASMA CELLS 6%). - SEE COMMENT. PERIPHERAL BLOOD: - THROMBOCYTOPENIA.  Diagnosis Note The bone marrow is slightly hypercellular for age with trilineage hematopoiesis including abundant megakaryocytes displaying normal morphology. In the presence of thrombocytopenia, the bone marrow findings likely represent secondary changes due to peripheral platelet destruction, sequestration or consumption. In this background, there is slight plasmacytosis present (plasma cells 6%). Immunohistochemical stains show that the plasma cells display kappa light chain excess. The findings are limited but most suggestive of early involvement by plasma cell dyscrasia/neoplasm. 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  Previously in the 50 range.  Went over labs in detail with him and his son today.  Provided them a copy. -Bone marrow biopsy demonstrated that his bone marrow is functioning normally and producing megakaryocytes. -Continue to  monitor his platelets.  Repeat CBC on his next visit -B12 significant increase.  Today 912.  Continue supplements for now.  Will recheck at next vis  Atrial Fibrillation -Continue coumadin as prescribed. No signs of bleeding. Last INR 1.8. Subtherapeutic. Patients states he takes coumadin as prescribed.   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  M protein remains undetectable/not observed in most recent labs. Kappa lambda light chain range ratio remains normal at 1.24.  Repeat these labs in 4 months.  RTC in 4 months with labs.  Orders Placed This Encounter  Procedures  . Kappa/lambda light chains    Standing Status:   Future    Standing Expiration Date:   10/24/2018  . Beta 2 microglobuline, serum    Standing Status:   Future    Standing Expiration Date:   10/24/2018  . IgG, IgA, IgM    Standing Status:   Future    Standing Expiration Date:   10/24/2018  . Protein electrophoresis, serum    Standing Status:   Future    Standing Expiration Date:   10/24/2018  . Immunofixation electrophoresis    Standing Status:   Future    Standing Expiration Date:   10/24/2018  . Multiple Myeloma Panel (SPEP&IFE w/QIG)    Standing Status:   Future    Standing Expiration Date:   10/24/2018  . CBC with Differential/Platelet    Standing Status:   Future    Standing Expiration Date:   10/24/2018  . Comprehensive metabolic panel    Standing Status:   Future    Standing Expiration Date:   10/24/2018    Jacquelin Hawking, NP   10/24/2017 8:49 AM

## 2017-10-27 ENCOUNTER — Ambulatory Visit (INDEPENDENT_AMBULATORY_CARE_PROVIDER_SITE_OTHER): Payer: Medicare Other | Admitting: *Deleted

## 2017-10-27 DIAGNOSIS — I4891 Unspecified atrial fibrillation: Secondary | ICD-10-CM

## 2017-10-27 DIAGNOSIS — I480 Paroxysmal atrial fibrillation: Secondary | ICD-10-CM | POA: Diagnosis not present

## 2017-10-27 DIAGNOSIS — Z5181 Encounter for therapeutic drug level monitoring: Secondary | ICD-10-CM | POA: Diagnosis not present

## 2017-10-27 LAB — POCT INR: INR: 2.7

## 2017-10-27 NOTE — Patient Instructions (Signed)
Continue coumadin 1/2 tablet daily except 1 tablet on Mondays, Wednesdays and Fridays Recheck in 4 weeks 

## 2017-11-06 ENCOUNTER — Encounter (HOSPITAL_COMMUNITY): Payer: Self-pay | Admitting: Emergency Medicine

## 2017-11-06 ENCOUNTER — Inpatient Hospital Stay (HOSPITAL_COMMUNITY)
Admission: EM | Admit: 2017-11-06 | Discharge: 2017-11-07 | DRG: 292 | Disposition: A | Discharge status: Home / Self Care | Payer: Medicare Other | Attending: Internal Medicine | Admitting: Internal Medicine

## 2017-11-06 ENCOUNTER — Emergency Department (HOSPITAL_COMMUNITY): Payer: Medicare Other

## 2017-11-06 ENCOUNTER — Other Ambulatory Visit: Payer: Self-pay

## 2017-11-06 DIAGNOSIS — I5043 Acute on chronic combined systolic (congestive) and diastolic (congestive) heart failure: Principal | ICD-10-CM | POA: Diagnosis present

## 2017-11-06 DIAGNOSIS — Z86010 Personal history of colonic polyps: Secondary | ICD-10-CM | POA: Diagnosis not present

## 2017-11-06 DIAGNOSIS — Z79899 Other long term (current) drug therapy: Secondary | ICD-10-CM

## 2017-11-06 DIAGNOSIS — L03116 Cellulitis of left lower limb: Secondary | ICD-10-CM | POA: Diagnosis not present

## 2017-11-06 DIAGNOSIS — Z7901 Long term (current) use of anticoagulants: Secondary | ICD-10-CM

## 2017-11-06 DIAGNOSIS — Z87891 Personal history of nicotine dependence: Secondary | ICD-10-CM | POA: Diagnosis not present

## 2017-11-06 DIAGNOSIS — D696 Thrombocytopenia, unspecified: Secondary | ICD-10-CM | POA: Diagnosis not present

## 2017-11-06 DIAGNOSIS — Z951 Presence of aortocoronary bypass graft: Secondary | ICD-10-CM

## 2017-11-06 DIAGNOSIS — K219 Gastro-esophageal reflux disease without esophagitis: Secondary | ICD-10-CM | POA: Diagnosis not present

## 2017-11-06 DIAGNOSIS — E876 Hypokalemia: Secondary | ICD-10-CM

## 2017-11-06 DIAGNOSIS — I509 Heart failure, unspecified: Secondary | ICD-10-CM

## 2017-11-06 DIAGNOSIS — M1990 Unspecified osteoarthritis, unspecified site: Secondary | ICD-10-CM | POA: Diagnosis present

## 2017-11-06 DIAGNOSIS — Z85038 Personal history of other malignant neoplasm of large intestine: Secondary | ICD-10-CM

## 2017-11-06 DIAGNOSIS — R0602 Shortness of breath: Secondary | ICD-10-CM | POA: Diagnosis present

## 2017-11-06 DIAGNOSIS — F419 Anxiety disorder, unspecified: Secondary | ICD-10-CM | POA: Diagnosis present

## 2017-11-06 DIAGNOSIS — I5041 Acute combined systolic (congestive) and diastolic (congestive) heart failure: Secondary | ICD-10-CM | POA: Diagnosis present

## 2017-11-06 DIAGNOSIS — J90 Pleural effusion, not elsewhere classified: Secondary | ICD-10-CM

## 2017-11-06 DIAGNOSIS — E039 Hypothyroidism, unspecified: Secondary | ICD-10-CM | POA: Diagnosis not present

## 2017-11-06 DIAGNOSIS — I482 Chronic atrial fibrillation, unspecified: Secondary | ICD-10-CM | POA: Diagnosis present

## 2017-11-06 DIAGNOSIS — I2510 Atherosclerotic heart disease of native coronary artery without angina pectoris: Secondary | ICD-10-CM | POA: Diagnosis present

## 2017-11-06 DIAGNOSIS — Z66 Do not resuscitate: Secondary | ICD-10-CM | POA: Diagnosis present

## 2017-11-06 DIAGNOSIS — I252 Old myocardial infarction: Secondary | ICD-10-CM | POA: Diagnosis not present

## 2017-11-06 DIAGNOSIS — N183 Chronic kidney disease, stage 3 unspecified: Secondary | ICD-10-CM | POA: Diagnosis present

## 2017-11-06 DIAGNOSIS — J449 Chronic obstructive pulmonary disease, unspecified: Secondary | ICD-10-CM | POA: Diagnosis present

## 2017-11-06 LAB — PROTIME-INR
INR: 3.02
PROTHROMBIN TIME: 31.1 s — AB (ref 11.4–15.2)

## 2017-11-06 LAB — URINALYSIS, ROUTINE W REFLEX MICROSCOPIC
BACTERIA UA: NONE SEEN
Bilirubin Urine: NEGATIVE
GLUCOSE, UA: NEGATIVE mg/dL
Ketones, ur: NEGATIVE mg/dL
Leukocytes, UA: NEGATIVE
Nitrite: NEGATIVE
PROTEIN: NEGATIVE mg/dL
Specific Gravity, Urine: 1.01 (ref 1.005–1.030)
Squamous Epithelial / LPF: NONE SEEN
pH: 7 (ref 5.0–8.0)

## 2017-11-06 LAB — CBC WITH DIFFERENTIAL/PLATELET
Basophils Absolute: 0 10*3/uL (ref 0.0–0.1)
Basophils Relative: 0 %
EOS PCT: 2 %
Eosinophils Absolute: 0.1 10*3/uL (ref 0.0–0.7)
HCT: 42.1 % (ref 39.0–52.0)
Hemoglobin: 13.7 g/dL (ref 13.0–17.0)
LYMPHS PCT: 15 %
Lymphs Abs: 0.9 10*3/uL (ref 0.7–4.0)
MCH: 31.3 pg (ref 26.0–34.0)
MCHC: 32.5 g/dL (ref 30.0–36.0)
MCV: 96.1 fL (ref 78.0–100.0)
MONO ABS: 0.7 10*3/uL (ref 0.1–1.0)
Monocytes Relative: 11 %
Neutro Abs: 4.7 10*3/uL (ref 1.7–7.7)
Neutrophils Relative %: 72 %
PLATELETS: 71 10*3/uL — AB (ref 150–400)
RBC: 4.38 MIL/uL (ref 4.22–5.81)
RDW: 14.6 % (ref 11.5–15.5)
WBC: 6.5 10*3/uL (ref 4.0–10.5)

## 2017-11-06 LAB — BASIC METABOLIC PANEL
Anion gap: 13 (ref 5–15)
BUN: 16 mg/dL (ref 6–20)
CO2: 24 mmol/L (ref 22–32)
CREATININE: 1.39 mg/dL — AB (ref 0.61–1.24)
Calcium: 8.8 mg/dL — ABNORMAL LOW (ref 8.9–10.3)
Chloride: 99 mmol/L — ABNORMAL LOW (ref 101–111)
GFR calc Af Amer: 54 mL/min — ABNORMAL LOW (ref >60)
GFR, EST NON AFRICAN AMERICAN: 47 mL/min — AB (ref >60)
Glucose, Bld: 105 mg/dL — ABNORMAL HIGH (ref 65–99)
Potassium: 3.4 mmol/L — ABNORMAL LOW (ref 3.5–5.1)
SODIUM: 136 mmol/L (ref 135–145)

## 2017-11-06 LAB — LACTIC ACID, PLASMA
LACTIC ACID, VENOUS: 1.4 mmol/L (ref 0.5–1.9)
Lactic Acid, Venous: 1.3 mmol/L (ref 0.5–1.9)

## 2017-11-06 LAB — BRAIN NATRIURETIC PEPTIDE: B Natriuretic Peptide: 769 pg/mL — ABNORMAL HIGH (ref 0.0–100.0)

## 2017-11-06 LAB — TROPONIN I: Troponin I: 0.08 ng/mL (ref <0.03)

## 2017-11-06 MED ORDER — SODIUM CHLORIDE 0.9% FLUSH
3.0000 mL | Freq: Two times a day (BID) | INTRAVENOUS | Status: DC
  Administered 2017-11-06 – 2017-11-07 (×2): 3 mL via INTRAVENOUS

## 2017-11-06 MED ORDER — HYDROCODONE-ACETAMINOPHEN 5-325 MG PO TABS: 1.0000 | ORAL_TABLET | Freq: Four times a day (QID) | ORAL | Status: DC | PRN

## 2017-11-06 MED ORDER — ALBUTEROL SULFATE (2.5 MG/3ML) 0.083% IN NEBU: 5.0000 mg | INHALATION_SOLUTION | Freq: Once | RESPIRATORY_TRACT | Status: DC

## 2017-11-06 MED ORDER — SODIUM CHLORIDE 0.9% FLUSH: 3.0000 mL | INTRAVENOUS | Status: DC | PRN

## 2017-11-06 MED ORDER — POTASSIUM CHLORIDE CRYS ER 20 MEQ PO TBCR
20.0000 meq | EXTENDED_RELEASE_TABLET | Freq: Two times a day (BID) | ORAL | Status: DC
  Administered 2017-11-06 – 2017-11-07 (×2): 20 meq via ORAL
  Filled 2017-11-06 (×2): qty 1

## 2017-11-06 MED ORDER — IPRATROPIUM-ALBUTEROL 0.5-2.5 (3) MG/3ML IN SOLN
3.0000 mL | Freq: Four times a day (QID) | RESPIRATORY_TRACT | Status: DC
  Administered 2017-11-07: 3 mL via RESPIRATORY_TRACT
  Filled 2017-11-06 (×2): qty 3

## 2017-11-06 MED ORDER — GUAIFENESIN ER 600 MG PO TB12
1200.0000 mg | ORAL_TABLET | Freq: Two times a day (BID) | ORAL | Status: DC
  Administered 2017-11-06 – 2017-11-07 (×2): 1200 mg via ORAL
  Filled 2017-11-06 (×2): qty 2

## 2017-11-06 MED ORDER — DILTIAZEM HCL ER COATED BEADS 120 MG PO CP24
120.0000 mg | ORAL_CAPSULE | Freq: Two times a day (BID) | ORAL | Status: DC
  Administered 2017-11-06 – 2017-11-07 (×2): 120 mg via ORAL
  Filled 2017-11-06 (×2): qty 1

## 2017-11-06 MED ORDER — CARVEDILOL 3.125 MG PO TABS
6.2500 mg | ORAL_TABLET | Freq: Two times a day (BID) | ORAL | Status: DC
  Administered 2017-11-06 – 2017-11-07 (×2): 6.25 mg via ORAL
  Filled 2017-11-06 (×2): qty 2

## 2017-11-06 MED ORDER — FUROSEMIDE 10 MG/ML IJ SOLN
40.0000 mg | Freq: Two times a day (BID) | INTRAMUSCULAR | Status: DC
  Administered 2017-11-06 – 2017-11-07 (×2): 40 mg via INTRAVENOUS
  Filled 2017-11-06 (×2): qty 4

## 2017-11-06 MED ORDER — IPRATROPIUM-ALBUTEROL 0.5-2.5 (3) MG/3ML IN SOLN
3.0000 mL | Freq: Four times a day (QID) | RESPIRATORY_TRACT | Status: DC
  Administered 2017-11-06: 3 mL via RESPIRATORY_TRACT
  Filled 2017-11-06: qty 3

## 2017-11-06 MED ORDER — ALBUTEROL SULFATE (2.5 MG/3ML) 0.083% IN NEBU
2.5000 mg | INHALATION_SOLUTION | Freq: Once | RESPIRATORY_TRACT | Status: AC
  Administered 2017-11-06: 2.5 mg via RESPIRATORY_TRACT
  Filled 2017-11-06: qty 3

## 2017-11-06 MED ORDER — FUROSEMIDE 10 MG/ML IJ SOLN
40.0000 mg | Freq: Once | INTRAMUSCULAR | Status: AC
  Administered 2017-11-06: 40 mg via INTRAVENOUS
  Filled 2017-11-06: qty 4

## 2017-11-06 MED ORDER — POTASSIUM CHLORIDE CRYS ER 20 MEQ PO TBCR
40.0000 meq | EXTENDED_RELEASE_TABLET | Freq: Once | ORAL | Status: AC
  Administered 2017-11-06: 40 meq via ORAL
  Filled 2017-11-06: qty 2

## 2017-11-06 MED ORDER — WARFARIN SODIUM 1 MG PO TABS
1.0000 mg | ORAL_TABLET | Freq: Once | ORAL | Status: AC
  Administered 2017-11-06: 1 mg via ORAL
  Filled 2017-11-06: qty 1

## 2017-11-06 MED ORDER — IPRATROPIUM-ALBUTEROL 0.5-2.5 (3) MG/3ML IN SOLN
3.0000 mL | Freq: Once | RESPIRATORY_TRACT | Status: AC
  Administered 2017-11-06: 3 mL via RESPIRATORY_TRACT
  Filled 2017-11-06: qty 3

## 2017-11-06 MED ORDER — CEFAZOLIN SODIUM-DEXTROSE 1-4 GM/50ML-% IV SOLN
1.0000 g | Freq: Once | INTRAVENOUS | Status: AC
  Administered 2017-11-06: 1 g via INTRAVENOUS
  Filled 2017-11-06: qty 50

## 2017-11-06 MED ORDER — ONDANSETRON HCL 4 MG/2ML IJ SOLN: 4.0000 mg | Freq: Four times a day (QID) | INTRAMUSCULAR | Status: DC | PRN

## 2017-11-06 MED ORDER — WARFARIN - PHARMACIST DOSING INPATIENT
Status: DC
  Administered 2017-11-06: 18:00:00

## 2017-11-06 MED ORDER — CEFAZOLIN SODIUM-DEXTROSE 1-4 GM/50ML-% IV SOLN
1.0000 g | Freq: Three times a day (TID) | INTRAVENOUS | Status: DC
  Administered 2017-11-06 – 2017-11-07 (×3): 1 g via INTRAVENOUS
  Filled 2017-11-06 (×9): qty 50

## 2017-11-06 MED ORDER — SODIUM CHLORIDE 0.9 % IV SOLN: 250.0000 mL | INTRAVENOUS | Status: DC | PRN

## 2017-11-06 MED ORDER — ACETAMINOPHEN 325 MG PO TABS: 650.0000 mg | ORAL_TABLET | ORAL | Status: DC | PRN

## 2017-11-06 NOTE — H&P (Signed)
History and Physical    Cory Ryan GGY:[id number] DOB: [birth year] DOA: 11/06/2017  PCP: [name], MD  Patient coming from: home  I have personally briefly reviewed patient's old medical records in [place]  Chief Complaint: shortness of breath  HPI: Cory Ryan is a 77 y.o. male with medical history significant of combined CHF, chronic atrial fibrillation, chronic kidney disease stage III, COPD, presents to the hospital with complaints of shortness of breath.  Onset of symptoms approximately 2-3 days ago.  He has had progressive shortness of breath.  No significant cough.  No fevers.  No chest pain.  No wheezing.  He has chronic lower extremity edema.  He is been compliant with his medications.  Says that his weight has been steady.  He has not had any nausea, vomiting, diarrhea..  ED Course: Chest x-ray in the emergency room indicated evidence of small pleural effusion and pulmonary edema.  Platelets were noted to be low, although at baseline in the 70s.  Creatinine was near baseline at 1.3.  He received nebulizer treatments as well as a dose of intravenous Lasix and is already feeling better.  Review of Systems: As per HPI otherwise 10 point review of systems negative.    Past Medical History:  Diagnosis Date  . Anemia   . Anxiety   . Aortic stenosis    Moderate  . Arthritis   . Atrial fibrillation ([place])   . Chronic systolic heart failure (HCC)    LVEF 40-45%  . CKD (chronic kidney disease) stage 3, GFR 30-59 ml/min (HCC)   . Colon cancer ([place])   . Colon polyps 12/2005   Per colonoscopy  . COPD (chronic obstructive pulmonary disease) ([place])   . Coronary atherosclerosis of native coronary artery    Multivessel status post CABG  . Emphysema   . Gastric erosions 12/2005   Per EGD - Dr. [name]  . GERD (gastroesophageal reflux disease)   . Hypothyroidism   . Ischemic cardiomyopathy   . Mitral regurgitation    Moderate  . Myocardial infarction [facility])    Remote  .  Psoriasis   . Pulmonary nodules     Past Surgical History:  Procedure Laterality Date  . COLECTOMY N/A 12/13/2013   Procedure: TOTAL COLECTOMY;  Surgeon: [name], MD;  Location: [facility] ORS;  Service: General;  Laterality: N/A;  . CORONARY ARTERY BYPASS GRAFT  March 2004   LIMA to LAD, SVG to diagonal, SVG to OM 2, SVG to RCA  . FLEXIBLE SIGMOIDOSCOPY  10/25/2012   Procedure: FLEXIBLE SIGMOIDOSCOPY;  Surgeon: [name], MD;  Location: [facility] ENDO SUITE;  Service: Gastroenterology;  Laterality: N/A;  . PARTIAL COLECTOMY  10/26/2012   Procedure: PARTIAL COLECTOMY;  Surgeon: [name], MD;  Location: [facility] ORS;  Service: General;  Laterality: N/A;  Closure of Colovesical Fistula     reports that he quit smoking about 14 years ago. His smoking use included cigarettes. He has a 30.00 pack-year smoking history. he has never used smokeless tobacco. He reports that he does not drink alcohol or use drugs.  No Known Allergies  Family History  Problem Relation Age of Onset  . Cancer Brother   . Heart Problems Mother        unknown  . Hypertension Other      Prior to Admission medications   Medication Sig Start Date End Date Taking? Authorizing Provider  albuterol (PROVENTIL) (2.5 MG/3ML) 0.083% nebulizer solution Inhale 3 mLs into the  lungs every 4 (four) hours as needed for wheezing or shortness of breath.  08/06/15  Yes [provider]  carvedilol (COREG) 6.25 MG tablet TAKE (1) TABLET BY MOUTH TWICE DAILY WTIH A MEAL. 07/12/17  Yes [name], MD  diltiazem (CARDIZEM CD) 120 MG 24 hr capsule TAKE (1) CAPSULE BY MOUTH TWICE DAILY. 09/14/17  Yes [name], MD  furosemide (LASIX) 20 MG tablet TAKE (1) TABLET BY MOUTH ONCE DAILY. 09/01/17  Yes [name], MD  Guaifenesin [facility] MAXIMUM STRENGTH) 1200 MG TB12 Take 1 tablet by mouth 2 (two) times daily.    Yes [provider]  HYDROcodone-acetaminophen (NORCO) 5-325 MG per tablet Take 1-2 tablets by  mouth every 4 (four) hours as needed. Patient taking differently: Take 1 tablet by mouth every 6 (six) hours as needed for moderate pain.  12/18/13  Yes Aviva Signs, MD  Ipratropium-Albuterol (COMBIVENT RESPIMAT) 20-100 MCG/ACT AERS respimat Inhale 1 puff into the lungs as needed. Patient taking differently: Inhale 1 puff into the lungs every 6 (six) hours as needed for wheezing or shortness of breath.  06/15/13  Yes [name], MD  loperamide (IMODIUM) 2 MG capsule Take 1 capsule (2 mg total) by mouth as needed for diarrhea or loose stools. Patient taking differently: Take 4 mg by mouth daily.  10/29/12  Yes Chelsea Primus, MD  potassium chloride SA (K-DUR,KLOR-CON) 20 MEQ tablet Take 0.5 tablets (10 mEq total) by mouth daily. Patient taking differently: Take 20 mEq by mouth 2 (two) times daily.  02/16/17  Yes Lendon Colonel, NP  vitamin B-12 (CYANOCOBALAMIN) 1000 MCG tablet Take 1 tablet (1,000 mcg total) by mouth daily. 03/16/17  Yes Twana First, MD  warfarin (COUMADIN) 5 MG tablet Take 1/2 tablet daily except 1 tablet on Mondays, Wednesdays and Fridays 06/29/17  Yes [name], MD    Physical Exam: Vitals:   11/06/17 1115 11/06/17 1230 11/06/17 1245 11/06/17 1320  BP:  (!) 149/84  (!) 146/87  Pulse: 84  91 85  Resp:    16  Temp:    (!) 97.5 F (36.4 C)  TempSrc:    Oral  SpO2: 99%  97% 99%  Weight:    70.1 kg (154 lb 8.7 oz)  Height:    5\' 10"  (1.778 m)    Constitutional: NAD, calm, comfortable Vitals:   11/06/17 1115 11/06/17 1230 11/06/17 1245 11/06/17 1320  BP:  (!) 149/84  (!) 146/87  Pulse: 84  91 85  Resp:    16  Temp:    (!) 97.5 F (36.4 C)  TempSrc:    Oral  SpO2: 99%  97% 99%  Weight:    70.1 kg (154 lb 8.7 oz)  Height:    5\' 10"  (1.778 m)   Eyes: PERRL, lids and conjunctivae normal ENMT: Mucous membranes are moist. Posterior pharynx clear of any exudate or lesions.Normal dentition.  Neck: normal, supple, no masses, no thyromegaly Respiratory:  Crackles at bases. Normal respiratory effort. No accessory muscle use.  Cardiovascular: Irregular, no murmurs / rubs / gallops.  1+ extremity edema. 2+ pedal pulses. No carotid bruits.  Abdomen: no tenderness, no masses palpated. No hepatosplenomegaly. Bowel sounds positive.  Musculoskeletal: no clubbing / cyanosis. No joint deformity upper and lower extremities. Good ROM, no contractures. Normal muscle tone.  Skin: Venous stasis changes in lower extremities bilateral.  Erythema and warmth noted in left inner thigh. Neurologic: CN 2-12 grossly intact. Sensation intact, DTR normal. Strength 5/5 in  all 4.  Psychiatric: Normal judgment and insight. Alert and oriented x 3. Normal mood.   Labs on Admission: I have personally reviewed following labs and imaging studies  CBC: Recent Labs  Lab 11/06/17 1045  WBC 6.5  NEUTROABS 4.7  HGB 13.7  HCT 42.1  MCV 96.1  PLT 71*   Basic Metabolic Panel: Recent Labs  Lab 11/06/17 1045  NA 136  K 3.4*  CL 99*  CO2 24  GLUCOSE 105*  BUN 16  CREATININE 1.39*  CALCIUM 8.8*   GFR: Estimated Creatinine Clearance: 43.4 mL/min (A) (by C-G formula based on SCr of 1.39 mg/dL (H)). Liver Function Tests: No results for input(s): AST, ALT, ALKPHOS, BILITOT, PROT, ALBUMIN in the last 168 hours. No results for input(s): LIPASE, AMYLASE in the last 168 hours. No results for input(s): AMMONIA in the last 168 hours. Coagulation Profile: Recent Labs  Lab 11/06/17 1226  INR 3.02   Cardiac Enzymes: Recent Labs  Lab 11/06/17 1045  TROPONINI 0.08*   BNP (last 3 results) No results for input(s): PROBNP in the last 8760 hours. HbA1C: No results for input(s): HGBA1C in the last 72 hours. CBG: No results for input(s): GLUCAP in the last 168 hours. Lipid Profile: No results for input(s): CHOL, HDL, LDLCALC, TRIG, CHOLHDL, LDLDIRECT in the last 72 hours. Thyroid Function Tests: No results for input(s): TSH, T4TOTAL, FREET4, T3FREE, THYROIDAB in the last  72 hours. Anemia Panel: No results for input(s): VITAMINB12, FOLATE, FERRITIN, TIBC, IRON, RETICCTPCT in the last 72 hours. Urine analysis:    Component Value Date/Time   COLORURINE YELLOW 11/06/2017 1054   APPEARANCEUR CLEAR 11/06/2017 1054   LABSPEC 1.010 11/06/2017 1054   PHURINE 7.0 11/06/2017 1054   GLUCOSEU NEGATIVE 11/06/2017 1054   HGBUR MODERATE (A) 11/06/2017 1054   BILIRUBINUR NEGATIVE 11/06/2017 1054   [place] 11/06/2017 1054   PROTEINUR NEGATIVE 11/06/2017 1054   UROBILINOGEN 0.2 02/06/2012 1450   NITRITE NEGATIVE 11/06/2017 1054   LEUKOCYTESUR NEGATIVE 11/06/2017 1054    Radiological Exams on Admission: Dg Chest 2 View  Result Date: 11/06/2017 CLINICAL DATA:  Shortness of breath. History of emphysema. Swollen leg. EXAM: CHEST  2 VIEW COMPARISON:  February 03, 2017 FINDINGS: Cardiomegaly. Small right pleural effusion. Mild edema. No pulmonary nodules or masses. No other acute abnormalities. IMPRESSION: Cardiomegaly, small right effusion, and mild pulmonary edema. Electronically Signed   By: Dorise Bullion III M.D   On: 11/06/2017 10:51    EKG: Independently reviewed. Atrial fibrillation with left bundle branch block  Assessment/Plan Active Problems:   Long term current use of anticoagulant therapy   CKD (chronic kidney disease) stage 3, GFR 30-59 ml/min (HCC)   Acute on chronic combined systolic and diastolic congestive heart failure (HCC)   Chronic atrial fibrillation (HCC)   Thrombocytopenia (HCC)   COPD mixed type (HCC)   CHF exacerbation (HCC)   Cellulitis of left thigh     1. Acute on chronic combined CHF.  Start the patient on intravenous Lasix.  Continue on beta-blockers.  Not a candidate for ACE inhibitors due to renal dysfunction.  Monitor intake and output.  Repeat echocardiogram 2. Chronic atrial fibrillation.  Continue rate control with beta-blockers and diltiazem.  He is anticoagulated with Coumadin. 3. COPD.  No wheezing at this time we  will continue bronchodilators as needed. 4. Chronic kidney disease stage III.  Creatinine is at baseline at 1.3.  Continue to monitor in the setting of diuresis. 5. Cellulitis of left thigh.  Patient  reports onset approximately 2-3 days ago.  He is been started on intravenous Ancef.  Continue to monitor 6. Thrombocytopenia.  Followed by oncology.  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  Platelets are currently at baseline.  Continue to monitor.  DVT prophylaxis: coumadin Code Status: DNR Family Communication: no family present Disposition Plan: discharge home once improved Consults called:  Admission status: inpatient, tele  Kathie Dike MD [facility] Pager [phone]  If 7PM-7AM, please contact night-coverage [email or web address] Password [facility]  11/06/2017, 4:31 PM

## 2017-11-06 NOTE — Progress Notes (Signed)
ANTICOAGULATION CONSULT NOTE - Initial Consult  Pharmacy Consult for Coumadin (home med) Indication: atrial fibrillation  No Known Allergies  Patient Measurements: Height: 5\' 10"  (177.8 cm) Weight: 154 lb 8.7 oz (70.1 kg) IBW/kg (Calculated) : 73  Vital Signs: Temp: 97.5 F (36.4 C) (01/26 1320) Temp Source: Oral (01/26 1320) BP: 146/87 (01/26 1320) Pulse Rate: 85 (01/26 1320)  Labs: Recent Labs    11/06/17 1045 11/06/17 1226  HGB 13.7  --   HCT 42.1  --   PLT 71*  --   LABPROT  --  31.1*  INR  --  3.02  CREATININE 1.39*  --   TROPONINI 0.08*  --     Estimated Creatinine Clearance: 43.4 mL/min (A) (by C-G formula based on SCr of 1.39 mg/dL (H)).   Medical History: Past Medical History:  Diagnosis Date  . Anemia   . Anxiety   . Aortic stenosis    Moderate  . Arthritis   . Atrial fibrillation ([place])   . Chronic systolic heart failure (HCC)    LVEF 40-45%  . CKD (chronic kidney disease) stage 3, GFR 30-59 ml/min (HCC)   . Colon cancer ([place])   . Colon polyps 12/2005   Per colonoscopy  . COPD (chronic obstructive pulmonary disease) ([place])   . Coronary atherosclerosis of native coronary artery    Multivessel status post CABG  . Emphysema   . Gastric erosions 12/2005   Per EGD - Dr. [name]  . GERD (gastroesophageal reflux disease)   . Hypothyroidism   . Ischemic cardiomyopathy   . Mitral regurgitation    Moderate  . Myocardial infarction [facility]    Remote  . Psoriasis   . Pulmonary nodules     Medications:  Medications Prior to Admission  Medication Sig Dispense Refill Last Dose  . albuterol (PROVENTIL) (2.5 MG/3ML) 0.083% nebulizer solution Inhale 3 mLs into the lungs every 4 (four) hours as needed for wheezing or shortness of breath.    Past Week at Unknown time  . carvedilol (COREG) 6.25 MG tablet TAKE (1) TABLET BY MOUTH TWICE DAILY WTIH A MEAL. 60 tablet 6 11/06/2017 at 0730  . diltiazem (CARDIZEM CD) 120 MG 24 hr capsule TAKE (1) CAPSULE BY MOUTH TWICE  DAILY. 180 capsule 3 11/06/2017 at Unknown time  . furosemide (LASIX) 20 MG tablet TAKE (1) TABLET BY MOUTH ONCE DAILY. 90 tablet 3 11/06/2017 at Unknown time  . Guaifenesin (MUCINEX MAXIMUM STRENGTH) 1200 MG TB12 Take 1 tablet by mouth 2 (two) times daily.    11/06/2017 at Unknown time  . HYDROcodone-acetaminophen (NORCO) 5-325 MG per tablet Take 1-2 tablets by mouth every 4 (four) hours as needed. (Patient taking differently: Take 1 tablet by mouth every 6 (six) hours as needed for moderate pain. ) 40 tablet 0 unknown  . Ipratropium-Albuterol (COMBIVENT RESPIMAT) 20-100 MCG/ACT AERS respimat Inhale 1 puff into the lungs as needed. (Patient taking differently: Inhale 1 puff into the lungs every 6 (six) hours as needed for wheezing or shortness of breath. ) 1 Inhaler 1 unknown  . loperamide (IMODIUM) 2 MG capsule Take 1 capsule (2 mg total) by mouth as needed for diarrhea or loose stools. (Patient taking differently: Take 4 mg by mouth daily. ) 20 capsule 2 11/06/2017 at Unknown time  . potassium chloride SA (K-DUR,KLOR-CON) 20 MEQ tablet Take 0.5 tablets (10 mEq total) by mouth daily. (Patient taking differently: Take 20 mEq by mouth 2 (two) times daily. ) 90 tablet 3 11/05/2017 at Unknown time  .  vitamin B-12 (CYANOCOBALAMIN) 1000 MCG tablet Take 1 tablet (1,000 mcg total) by mouth daily. 60 tablet 2 11/06/2017 at Unknown time  . warfarin (COUMADIN) 5 MG tablet Take 1/2 tablet daily except 1 tablet on Mondays, Wednesdays and Fridays 30 tablet 4 11/05/2017 at 1800   Assessment: Pt on chronic Coumadin for h/o afib.  INR slightly above goal on admission.  Last dose reported as taken on 1/25  Goal of Therapy:  INR 2-3 Monitor platelets by anticoagulation protocol: Yes   Plan: Coumadin 1mg  today x 1 INR daily  Hart Robinsons A 11/06/2017,4:54 PM

## 2017-11-06 NOTE — ED Triage Notes (Signed)
Patient complains of shortness of breath x 2 days. Complains of swelling and redness to left upper leg x 2 days. States pain and warmth to upper leg.

## 2017-11-06 NOTE — ED Notes (Signed)
Date and time results received: 11/06/17 11:38 AM  (use smartphrase ".now" to insert current time)  Test: trop Critical Value: 0.08  Name of Provider Notified: [email or web address]  Orders Received? Or Actions Taken?: none

## 2017-11-06 NOTE — ED Provider Notes (Signed)
[facility] EMERGENCY DEPARTMENT Provider Note   CSN: [id number] Arrival date & time: 11/06/17  0930     History   Chief Complaint Chief Complaint  Patient presents with  . Shortness of Breath    HPI Cory Ryan is a 77 y.o. male.  HPI  Pt was seen at 0945.  Per pt, c/o gradual onset and worsening of SOB for the past 2 days.  Has been using home MDI with transient relief. Pt endorses hx of CHF and COPD. Pt also c/o gradual onset and worsening of persistent left thigh "redness" for the past 3 to 4 days. Denies injury, no other areas of rash, no focal motor weakness, no tingling/numbness in extremities.  Denies CP/palpitations, no back pain, no abd pain, no N/V/D, no fevers.    Past Medical History:  Diagnosis Date  . Anemia   . Anxiety   . Aortic stenosis    Moderate  . Arthritis   . Atrial fibrillation ([place])   . Chronic systolic heart failure (HCC)    LVEF 40-45%  . CKD (chronic kidney disease) stage 3, GFR 30-59 ml/min (HCC)   . Colon cancer ([place])   . Colon polyps 12/2005   Per colonoscopy  . COPD (chronic obstructive pulmonary disease) ([place])   . Coronary atherosclerosis of native coronary artery    Multivessel status post CABG  . Emphysema   . Gastric erosions 12/2005   Per EGD - Dr. [name]  . GERD (gastroesophageal reflux disease)   . Hypothyroidism   . Ischemic cardiomyopathy   . Mitral regurgitation    Moderate  . Myocardial infarction [facility])    Remote  . Psoriasis   . Pulmonary nodules     Patient Active Problem List   Diagnosis Date Noted  . Leukopenia 02/11/2017  . COPD mixed type ([place]) 08/11/2015  . Atrial fibrillation with rapid ventricular response ([place])   . Thrombocytopenia ([place]) 08/10/2015  . Chronic atrial fibrillation ([place]) 08/08/2015  . Bronchospasm 10/05/2014  . Acute on chronic combined systolic and diastolic congestive heart failure ([place]) 10/03/2014  . Encounter for therapeutic drug monitoring 12/27/2013  . History of colon cancer   .  Colon cancer ([place]) 11/21/2013  . Coronary atherosclerosis of native coronary artery 06/15/2013  . Aortic stenosis 06/15/2013  . Mitral regurgitation 06/15/2013  . CKD (chronic kidney disease) stage 3, GFR 30-59 ml/min (HCC) 06/15/2013  . Long term current use of anticoagulant therapy 12/20/2012  . Systolic CHF, chronic ([place]) 02/06/2012    Past Surgical History:  Procedure Laterality Date  . COLECTOMY N/A 12/13/2013   Procedure: TOTAL COLECTOMY;  Surgeon: [name], MD;  Location: [facility] ORS;  Service: General;  Laterality: N/A;  . CORONARY ARTERY BYPASS GRAFT  March 2004   LIMA to LAD, SVG to diagonal, SVG to OM 2, SVG to RCA  . FLEXIBLE SIGMOIDOSCOPY  10/25/2012   Procedure: FLEXIBLE SIGMOIDOSCOPY;  Surgeon: [name], MD;  Location: [facility] ENDO SUITE;  Service: Gastroenterology;  Laterality: N/A;  . PARTIAL COLECTOMY  10/26/2012   Procedure: PARTIAL COLECTOMY;  Surgeon: [name], MD;  Location: [facility] ORS;  Service: General;  Laterality: N/A;  Closure of Colovesical Fistula      Home Medications    Prior to Admission medications   Medication Sig Start Date End Date Taking? Authorizing Provider  albuterol (PROVENTIL) (2.5 MG/3ML) 0.083% nebulizer solution Inhale 3 mLs into the lungs every 4 (four) hours as needed for wheezing or shortness of breath.  08/06/15  [provider]  carvedilol (COREG) 6.25 MG tablet TAKE (1) TABLET BY MOUTH TWICE DAILY WTIH A MEAL. 07/12/17   [name], MD  diltiazem (CARDIZEM CD) 120 MG 24 hr capsule TAKE (1) CAPSULE BY MOUTH TWICE DAILY. 09/14/17   [name], MD  furosemide (LASIX) 20 MG tablet TAKE (1) TABLET BY MOUTH ONCE DAILY. 09/01/17   [name], MD  Guaifenesin [facility] MAXIMUM STRENGTH) 1200 MG TB12 Take 1 tablet by mouth daily as needed (for congestion).    [provider]  HYDROcodone-acetaminophen (NORCO) 5-325 MG per tablet Take 1-2 tablets by mouth every 4 (four) hours as needed. Patient taking  differently: Take 1 tablet by mouth every 6 (six) hours as needed for moderate pain.  12/18/13   Aviva Signs, MD  Ipratropium-Albuterol (COMBIVENT RESPIMAT) 20-100 MCG/ACT AERS respimat Inhale 1 puff into the lungs as needed. Patient taking differently: Inhale 1 puff into the lungs every 6 (six) hours as needed for wheezing or shortness of breath.  06/15/13   [name], MD  loperamide (IMODIUM) 2 MG capsule Take 1 capsule (2 mg total) by mouth as needed for diarrhea or loose stools. Patient taking differently: Take 2-4 mg by mouth as needed for diarrhea or loose stools.  10/29/12   Chelsea Primus, MD  potassium chloride SA (K-DUR,KLOR-CON) 20 MEQ tablet Take 0.5 tablets (10 mEq total) by mouth daily. 02/16/17   Lendon Colonel, NP  vitamin B-12 (CYANOCOBALAMIN) 1000 MCG tablet Take 1 tablet (1,000 mcg total) by mouth daily. 03/16/17   Twana First, MD  warfarin (COUMADIN) 5 MG tablet Take 1/2 tablet daily except 1 tablet on Mondays, Wednesdays and Fridays 06/29/17   [name], MD    Family History Family History  Problem Relation Age of Onset  . Cancer Brother   . Heart Problems Mother        unknown  . Hypertension Other     Social History Social History   Tobacco Use  . Smoking status: Former Smoker    Packs/day: 1.00    Years: 30.00    Pack years: 30.00    Types: Cigarettes    Last attempt to quit: 03/17/2003    Years since quitting: 14.6  . Smokeless tobacco: Never Used  Substance Use Topics  . Alcohol use: No    Alcohol/week: 0.0 oz    Comment: no alcohol use since '04  . Drug use: No     Allergies   Patient has no known allergies.   Review of Systems Review of Systems ROS: Statement: All systems negative except as marked or noted in the HPI; Constitutional: Negative for fever and chills. ; ; Eyes: Negative for eye pain, redness and discharge. ; ; ENMT: Negative for ear pain, hoarseness, nasal congestion, sinus pressure and sore throat. ; ;  Cardiovascular: Negative for chest pain, palpitations, diaphoresis, and peripheral edema. ; ; Respiratory: +SOB. Negative for cough, wheezing and stridor. ; ; Gastrointestinal: Negative for nausea, vomiting, diarrhea, abdominal pain, blood in stool, hematemesis, jaundice and rectal bleeding. . ; ; Genitourinary: Negative for dysuria, flank pain and hematuria. ; ; Musculoskeletal: Negative for back pain and neck pain. Negative for swelling and trauma.; ; Skin: +rash. Negative for pruritus, abrasions, blisters, bruising and skin lesion.; ; Neuro: Negative for headache, lightheadedness and neck stiffness. Negative for weakness, altered level of consciousness, altered mental status, extremity weakness, paresthesias, involuntary movement, seizure and syncope.       Physical Exam Updated Vital Signs  Pulse 79   Temp (!) 97.4 F (36.3 C) (Oral)   Resp 18   Ht 5\' 10"  (1.778 m)   Wt 75.3 kg (166 lb)   SpO2 99%   BMI 23.82 kg/m   Physical Exam 0950: Physical examination:  Nursing notes reviewed; Vital signs and O2 SAT reviewed;  Constitutional: Well developed, Well nourished, Well hydrated, In no acute distress; Head:  Normocephalic, atraumatic; Eyes: EOMI, PERRL, No scleral icterus; ENMT: Mouth and pharynx normal, Mucous membranes moist; Neck: Supple, Full range of motion, No lymphadenopathy; Cardiovascular: Irregular rate and rhythm, No gallop; Respiratory: Breath sounds clear & equal bilaterally, No wheezes.  Speaking full sentences with ease, Normal respiratory effort/excursion; Chest: Nontender, Movement normal; Abdomen: Soft, Nontender, Nondistended, Normal bowel sounds; Genitourinary: No CVA tenderness; Extremities: Pulses normal, +left anterior and lateral thigh with erythema, no soft tissue crepitus, no tenderness. Muscles compartments soft. Chronic skin changes bilat anterior tibial areas. +tr pedal edema bilat. No calf edema or asymmetry.; Neuro: AA&Ox3, Major CN grossly intact.  Speech clear. No  gross focal motor or sensory deficits in extremities.; Skin: Color normal, Warm, Dry.   ED Treatments / Results  Labs (all labs ordered are listed, but only abnormal results are displayed)   EKG  EKG Interpretation  Date/Time:  Saturday November 06 2017 09:47:28 EST Ventricular Rate:  100 PR Interval:    QRS Duration: 126 QT Interval:  386 QTC Calculation: 498 R Axis:   18 Text Interpretation:  Atrial fibrillation Ventricular premature complex Nonspecific intraventricular conduction delay Borderline repol abnormality, diffuse leads Nonspecific ST and T wave abnormality Lateral leads Baseline wander When compared with ECG of 02/03/2017 No significant change was found Confirmed by Francine Graven [phone]) on 11/06/2017 10:13:24 AM        EKG Interpretation  Date/Time:  Saturday November 06 2017 12:11:27 EST Ventricular Rate:  83 PR Interval:    QRS Duration: 134 QT Interval:  373 QTC Calculation: 439 R Axis:   -36 Text Interpretation:  Atrial fibrillation Left bundle branch block Since last tracing of earlier today Rate slower Confirmed by Francine Graven [phone]) on 11/06/2017 1:05:36 PM         Radiology   Procedures Procedures (including critical care time)  Medications Ordered in ED Medications  ipratropium-albuterol (DUONEB) 0.5-2.5 (3) MG/3ML nebulizer solution 3 mL (not administered)  albuterol (PROVENTIL) (2.5 MG/3ML) 0.083% nebulizer solution 2.5 mg (not administered)     Initial Impression / Assessment and Plan / ED Course  I have reviewed the triage vital signs and the nursing notes.  Pertinent labs & imaging results that were available during my care of the patient were reviewed by me and considered in my medical decision making (see chart for details).  MDM Reviewed: previous chart, nursing note and vitals Reviewed previous: labs and ECG Interpretation: labs, ECG and x-ray   Results for orders placed or performed during the hospital encounter of  40/97/35  Basic metabolic panel  Result Value Ref Range   Sodium 136 135 - 145 mmol/L   Potassium 3.4 (L) 3.5 - 5.1 mmol/L   Chloride 99 (L) 101 - 111 mmol/L   CO2 24 22 - 32 mmol/L   Glucose, Bld 105 (H) 65 - 99 mg/dL   BUN 16 6 - 20 mg/dL   Creatinine, Ser 1.39 (H) 0.61 - 1.24 mg/dL   Calcium 8.8 (L) 8.9 - 10.3 mg/dL   GFR calc non Af Amer 47 (L) >60 mL/min   GFR calc Af Amer 54 (L) >  60 mL/min   Anion gap 13 5 - 15  Lactic acid, plasma  Result Value Ref Range   Lactic Acid, Venous 1.4 0.5 - 1.9 mmol/L  Troponin I  Result Value Ref Range   Troponin I 0.08 (HH) <0.03 ng/mL  Brain natriuretic peptide  Result Value Ref Range   B Natriuretic Peptide 769.0 (H) 0.0 - 100.0 pg/mL  CBC with Differential  Result Value Ref Range   WBC 6.5 4.0 - 10.5 K/uL   RBC 4.38 4.22 - 5.81 MIL/uL   Hemoglobin 13.7 13.0 - 17.0 g/dL   HCT 42.1 39.0 - 52.0 %   MCV 96.1 78.0 - 100.0 fL   MCH 31.3 26.0 - 34.0 pg   MCHC 32.5 30.0 - 36.0 g/dL   RDW 14.6 11.5 - 15.5 %   Platelets 71 (L) 150 - 400 K/uL   Neutrophils Relative % 72 %   Neutro Abs 4.7 1.7 - 7.7 K/uL   Lymphocytes Relative 15 %   Lymphs Abs 0.9 0.7 - 4.0 K/uL   Monocytes Relative 11 %   Monocytes Absolute 0.7 0.1 - 1.0 K/uL   Eosinophils Relative 2 %   Eosinophils Absolute 0.1 0.0 - 0.7 K/uL   Basophils Relative 0 %   Basophils Absolute 0.0 0.0 - 0.1 K/uL  Urinalysis, Routine w reflex microscopic  Result Value Ref Range   Color, Urine YELLOW YELLOW   APPearance CLEAR CLEAR   Specific Gravity, Urine 1.010 1.005 - 1.030   pH 7.0 5.0 - 8.0   Glucose, UA NEGATIVE NEGATIVE mg/dL   Hgb urine dipstick MODERATE (A) NEGATIVE   Bilirubin Urine NEGATIVE NEGATIVE   Ketones, ur NEGATIVE NEGATIVE mg/dL   Protein, ur NEGATIVE NEGATIVE mg/dL   Nitrite NEGATIVE NEGATIVE   Leukocytes, UA NEGATIVE NEGATIVE   RBC / HPF 6-30 0 - 5 RBC/hpf   WBC, UA 0-5 0 - 5 WBC/hpf   Bacteria, UA NONE SEEN NONE SEEN   Squamous Epithelial / LPF NONE SEEN NONE  SEEN    Dg Chest 2 View Result Date: 11/06/2017 CLINICAL DATA:  Shortness of breath. History of emphysema. Swollen leg. EXAM: CHEST  2 VIEW COMPARISON:  February 03, 2017 FINDINGS: Cardiomegaly. Small right pleural effusion. Mild edema. No pulmonary nodules or masses. No other acute abnormalities. IMPRESSION: Cardiomegaly, small right effusion, and mild pulmonary edema. Electronically Signed   By: Dorise Bullion III M.D   On: 11/06/2017 10:51    Results for DELIA, SITAR (MRN [id number]) as of 11/06/2017 11:44  Ref. Range 03/04/2017 09:05 05/20/2017 13:34 06/16/2017 13:04 10/19/2017 12:04 11/06/2017 10:45  BUN Latest Ref Range: 6 - 20 mg/dL 16 17 18 16 16   Creatinine Latest Ref Range: 0.61 - 1.24 mg/dL 1.15 1.13 1.29 (H) 1.26 (H) 1.39 (H)   Results for RICHY, SPRADLEY (MRN [id number]) as of 11/06/2017 11:44  Ref. Range 08/08/2015 17:13 10/29/2015 17:20 02/03/2017 18:15 02/09/2017 15:51 11/06/2017 10:45  B Natriuretic Peptide Latest Ref Range: 0.0 - 100.0 pg/mL 725.0 (H) 720.0 (H) 863.0 (H) 880.0 (H) 769.0 (H)     1205:  Short neb given on arrival with minimal change in pt's c/o SOB.  Troponin mildly elevated; pt denies CP and EKG x2 without acute STTW changes. BNP elevated with pulmonary edema on CXR; IV lasix ordered. IV abx ordered for left thigh cellulitis. Potassium repleted PO.  Dx and testing d/w pt and family.  Questions answered.  Verb understanding, agreeable to admit.  T/C to [facility] Dr. Roderic Palau, case discussed, including:  HPI, pertinent PM/SHx, VS/PE, dx testing, ED course and treatment:  Agreeable to admit.     Final Clinical Impressions(s) / ED Diagnoses   Final diagnoses:  None    ED Discharge Orders    None       Francine Graven, DO 11/07/17 1547

## 2017-11-07 ENCOUNTER — Inpatient Hospital Stay (HOSPITAL_COMMUNITY): Payer: Medicare Other

## 2017-11-07 DIAGNOSIS — Z7901 Long term (current) use of anticoagulants: Secondary | ICD-10-CM

## 2017-11-07 DIAGNOSIS — E876 Hypokalemia: Secondary | ICD-10-CM

## 2017-11-07 DIAGNOSIS — I5043 Acute on chronic combined systolic (congestive) and diastolic (congestive) heart failure: Secondary | ICD-10-CM | POA: Diagnosis not present

## 2017-11-07 DIAGNOSIS — I361 Nonrheumatic tricuspid (valve) insufficiency: Secondary | ICD-10-CM | POA: Diagnosis not present

## 2017-11-07 DIAGNOSIS — I482 Chronic atrial fibrillation: Secondary | ICD-10-CM | POA: Diagnosis not present

## 2017-11-07 DIAGNOSIS — L03116 Cellulitis of left lower limb: Secondary | ICD-10-CM | POA: Diagnosis not present

## 2017-11-07 DIAGNOSIS — R0602 Shortness of breath: Secondary | ICD-10-CM | POA: Diagnosis not present

## 2017-11-07 DIAGNOSIS — N183 Chronic kidney disease, stage 3 (moderate): Secondary | ICD-10-CM | POA: Diagnosis not present

## 2017-11-07 LAB — ECHOCARDIOGRAM COMPLETE
Height: 70 in
WEIGHTICAEL: 2359.8 [oz_av]

## 2017-11-07 LAB — BASIC METABOLIC PANEL
ANION GAP: 14 (ref 5–15)
BUN: 21 mg/dL — ABNORMAL HIGH (ref 6–20)
CALCIUM: 9.3 mg/dL (ref 8.9–10.3)
CO2: 27 mmol/L (ref 22–32)
Chloride: 98 mmol/L — ABNORMAL LOW (ref 101–111)
Creatinine, Ser: 1.44 mg/dL — ABNORMAL HIGH (ref 0.61–1.24)
GFR, EST AFRICAN AMERICAN: 52 mL/min — AB (ref >60)
GFR, EST NON AFRICAN AMERICAN: 45 mL/min — AB (ref >60)
Glucose, Bld: 101 mg/dL — ABNORMAL HIGH (ref 65–99)
Potassium: 3.6 mmol/L (ref 3.5–5.1)
SODIUM: 139 mmol/L (ref 135–145)

## 2017-11-07 LAB — PROTIME-INR
INR: 2.66
PROTHROMBIN TIME: 28.1 s — AB (ref 11.4–15.2)

## 2017-11-07 LAB — URINE CULTURE: Culture: NO GROWTH

## 2017-11-07 MED ORDER — CEPHALEXIN 500 MG PO CAPS: 500.0000 mg | ORAL_CAPSULE | Freq: Four times a day (QID) | ORAL | 0 refills | Status: AC

## 2017-11-07 MED ORDER — FUROSEMIDE 20 MG PO TABS: 40.0000 mg | ORAL_TABLET | Freq: Every day | ORAL | 3 refills | Status: DC

## 2017-11-07 MED ORDER — WARFARIN SODIUM 2.5 MG PO TABS: 2.5000 mg | ORAL_TABLET | Freq: Once | ORAL | Status: DC

## 2017-11-07 NOTE — Discharge Summary (Signed)
Physician Discharge Summary  Cory Ryan UVO:[id number] DOB: 22-Aug-1941 DOA: 11/06/2017  PCP: [name], MD  Admit date: 11/06/2017 Discharge date: 11/07/2017  Admitted From: Home Disposition: Home  Recommendations for Outpatient Follow-up:  1. Follow up with PCP in 1-2 weeks 2. Please obtain BMP/CBC in one week 3. Follow-up with cardiologist in 1-2 weeks  Home Health: Equipment/Devices:  Discharge Condition: Stable CODE STATUS: DNR Diet recommendation: Heart Healthy   Brief/Interim Summary: 77 year old male with a history of combined CHF, chronic atrial fibrillation, chronic kidney disease stage III, COPD, presents to the hospital with complaints of shortness of breath.  He was found to have acute on chronic combined CHF.  He was also noted to have redness and cellulitis in his left thigh.  Patient was admitted to the hospital for intravenous diuresis.  He was also started on nebulizer treatments as well as intravenous antibiotics for cellulitis.  The patient had excellent diuresis with Lasix and had approximately 3.2 L of urine output on the day of admission.  The following day, he felt that his respiratory status was significantly improved.  He was able to lay flat and was ambulating on room air without difficulty.  Redness in his left thigh had significantly improved.  Patient was not febrile.  He was requesting discharge home.  Unfortunately, the patient improved faster than was anticipated.  He felt appropriate for discharge home.  He will follow-up with his cardiologist as well as his primary care physician.  Antibiotics have been transitioned to Keflex.  He is been advised to continue Lasix at home, except double his dose for the next 5 days.  Discharge Diagnoses:  Active Problems:   Long term current use of anticoagulant therapy   CKD (chronic kidney disease) stage 3, GFR 30-59 ml/min (HCC)   Acute on chronic combined systolic and diastolic congestive heart failure (HCC)    Chronic atrial fibrillation (HCC)   Thrombocytopenia (HCC)   COPD mixed type (HCC)   CHF exacerbation (HCC)   Cellulitis of left thigh    Discharge Instructions  Discharge Instructions    Diet - low sodium heart healthy   Complete by:  As directed    Increase activity slowly   Complete by:  As directed      Allergies as of 11/07/2017   No Known Allergies     Medication List    TAKE these medications   albuterol (2.5 MG/3ML) 0.083% nebulizer solution Commonly known as:  PROVENTIL Inhale 3 mLs into the lungs every 4 (four) hours as needed for wheezing or shortness of breath.   carvedilol 6.25 MG tablet Commonly known as:  COREG TAKE (1) TABLET BY MOUTH TWICE DAILY WTIH A MEAL.   cephALEXin 500 MG capsule Commonly known as:  KEFLEX Take 1 capsule (500 mg total) by mouth 4 (four) times daily for 7 days.   diltiazem 120 MG 24 hr capsule Commonly known as:  CARDIZEM CD TAKE (1) CAPSULE BY MOUTH TWICE DAILY.   furosemide 20 MG tablet Commonly known as:  LASIX Take 2 tablets (40 mg total) by mouth daily. What changed:  See the new instructions.   HYDROcodone-acetaminophen 5-325 MG tablet Commonly known as:  NORCO Take 1-2 tablets by mouth every 4 (four) hours as needed. What changed:    how much to take  when to take this  reasons to take this   Ipratropium-Albuterol 20-100 MCG/ACT Aers respimat Commonly known as:  COMBIVENT RESPIMAT Inhale 1 puff into the lungs as needed. What changed:  when to take this  reasons to take this   loperamide 2 MG capsule Commonly known as:  IMODIUM Take 1 capsule (2 mg total) by mouth as needed for diarrhea or loose stools. What changed:    how much to take  when to take this   [place] 1200 MG Tb12 Generic drug:  Guaifenesin Take 1 tablet by mouth 2 (two) times daily.   potassium chloride SA 20 MEQ tablet Commonly known as:  K-DUR,KLOR-CON Take 0.5 tablets (10 mEq total) by mouth daily. What  changed:    how much to take  when to take this   vitamin B-12 1000 MCG tablet Commonly known as:  CYANOCOBALAMIN Take 1 tablet (1,000 mcg total) by mouth daily.   warfarin 5 MG tablet Commonly known as:  COUMADIN Take as directed. If you are unsure how to take this medication, talk to your nurse or doctor. Original instructions:  Take 1/2 tablet daily except 1 tablet on Mondays, Wednesdays and Fridays       No Known Allergies  Consultations:     Procedures/Studies: Dg Chest 2 View  Result Date: 11/06/2017 CLINICAL DATA:  Shortness of breath. History of emphysema. Swollen leg. EXAM: CHEST  2 VIEW COMPARISON:  February 03, 2017 FINDINGS: Cardiomegaly. Small right pleural effusion. Mild edema. No pulmonary nodules or masses. No other acute abnormalities. IMPRESSION: Cardiomegaly, small right effusion, and mild pulmonary edema. Electronically Signed   By: Dorise Bullion III M.D   On: 11/06/2017 10:51     Echo:- Left ventricle: The cavity size was mildly dilated. There was   mild concentric hypertrophy. Systolic function was mildly   reduced. The estimated ejection fraction was in the range of 45%   to 50%. There is akinesis of the inferior myocardium. - Aortic valve: A bicuspid morphology cannot be excluded; severely   thickened, severely calcified leaflets. There was mild stenosis.   Mean gradient (S): 8 mm Hg. Valve area (VTI): 1.08 cm^2. Valve   area (Vmax): 1.02 cm^2. Valve area (Vmean): 1.14 cm^2. - Mitral valve: Calcified annulus. There was moderate   regurgitation. - Left atrium: The atrium was moderately dilated. - Right atrium: The atrium was moderately dilated. - Atrial septum: There was increased thickness of the septum,   consistent with lipomatous hypertrophy. - Pulmonary arteries: PA peak pressure: 44 mm Hg (S).  Subjective: Shortness of breath improved.  Able to lay flat.  Ambulating without difficulty.  No chest pain.  Discharge Exam: Vitals:   11/07/17  0436 11/07/17 0722  BP: 120/70   Pulse: 86   Resp: 16   Temp: (!) 97.3 F (36.3 C)   SpO2: 96% 94%   Vitals:   11/06/17 1918 11/06/17 2025 11/07/17 0436 11/07/17 0722  BP:  (!) 141/73 120/70   Pulse:  86 86   Resp:  16 16   Temp:  (!) 97.5 F (36.4 C) (!) 97.3 F (36.3 C)   TempSrc:  Oral Oral   SpO2: 94% 95% 96% 94%  Weight:   66.9 kg (147 lb 7.8 oz)   Height:        General: Pt is alert, awake, not in acute distress Cardiovascular: RRR, S1/S2 +, no rubs, no gallops Respiratory: CTA bilaterally, no wheezing, no rhonchi Abdominal: Soft, NT, ND, bowel sounds + Extremities: no edema, no cyanosis, erythema in left thigh appears better today    The results of significant diagnostics from this hospitalization (including imaging, microbiology, ancillary and laboratory) are listed below for  reference.     Microbiology: Recent Results (from the past 240 hour(s))  Urine culture     Status: None   Collection Time: 11/06/17 10:54 AM  Result Value Ref Range Status   Specimen Description URINE, CLEAN CATCH  Final   Special Requests NONE  Final   Culture   Final    NO GROWTH Performed at [facility], [address] [place], [place] [address]    Report Status 11/07/2017 FINAL  Final     Labs: BNP (last 3 results) Recent Labs    02/03/17 1815 02/09/17 1551 11/06/17 1045  BNP 863.0* 880.0* 509.3*   Basic Metabolic Panel: Recent Labs  Lab 11/06/17 1045 11/07/17 0504  NA 136 139  K 3.4* 3.6  CL 99* 98*  CO2 24 27  GLUCOSE 105* 101*  BUN 16 21*  CREATININE 1.39* 1.44*  CALCIUM 8.8* 9.3   Liver Function Tests: No results for input(s): AST, ALT, ALKPHOS, BILITOT, PROT, ALBUMIN in the last 168 hours. No results for input(s): LIPASE, AMYLASE in the last 168 hours. No results for input(s): AMMONIA in the last 168 hours. CBC: Recent Labs  Lab 11/06/17 1045  WBC 6.5  NEUTROABS 4.7  HGB 13.7  HCT 42.1  MCV 96.1  PLT 71*   Cardiac Enzymes: Recent Labs   Lab 11/06/17 1045  TROPONINI 0.08*   BNP: Invalid input(s): POCBNP CBG: No results for input(s): GLUCAP in the last 168 hours. D-Dimer No results for input(s): DDIMER in the last 72 hours. Hgb A1c No results for input(s): HGBA1C in the last 72 hours. Lipid Profile No results for input(s): CHOL, HDL, LDLCALC, TRIG, CHOLHDL, LDLDIRECT in the last 72 hours. Thyroid function studies No results for input(s): TSH, T4TOTAL, T3FREE, THYROIDAB in the last 72 hours.  Invalid input(s): FREET3 Anemia work up No results for input(s): VITAMINB12, FOLATE, FERRITIN, TIBC, IRON, RETICCTPCT in the last 72 hours. Urinalysis    Component Value Date/Time   COLORURINE YELLOW 11/06/2017 1054   APPEARANCEUR CLEAR 11/06/2017 1054   LABSPEC 1.010 11/06/2017 1054   PHURINE 7.0 11/06/2017 1054   GLUCOSEU NEGATIVE 11/06/2017 1054   HGBUR MODERATE (A) 11/06/2017 1054   BILIRUBINUR NEGATIVE 11/06/2017 1054   KETONESUR NEGATIVE 11/06/2017 1054   PROTEINUR NEGATIVE 11/06/2017 1054   UROBILINOGEN 0.2 02/06/2012 1450   NITRITE NEGATIVE 11/06/2017 1054   LEUKOCYTESUR NEGATIVE 11/06/2017 1054   Sepsis Labs Invalid input(s): PROCALCITONIN,  WBC,  LACTICIDVEN Microbiology Recent Results (from the past 240 hour(s))  Urine culture     Status: None   Collection Time: 11/06/17 10:54 AM  Result Value Ref Range Status   Specimen Description URINE, CLEAN CATCH  Final   Special Requests NONE  Final   Culture   Final    NO GROWTH Performed at [facility], [place] [address], [place], [place] [address]    Report Status 11/07/2017 FINAL  Final     Time coordinating discharge: Over 30 minutes  SIGNED:   Kathie Dike, MD  [facility] 11/07/2017, 6:24 PM Pager   If 7PM-7AM, please contact night-coverage [email or web address] Password [email or web address]

## 2017-11-07 NOTE — Progress Notes (Signed)
[place] for Coumadin (home med) Indication: atrial fibrillation  No Known Allergies  Patient Measurements: Height: 5\' 10"  (177.8 cm) Weight: 147 lb 7.8 oz (66.9 kg) IBW/kg (Calculated) : 73  Vital Signs: Temp: 97.3 F (36.3 C) (01/27 0436) Temp Source: Oral (01/27 0436) BP: 120/70 (01/27 0436) Pulse Rate: 86 (01/27 0436)  Labs: Recent Labs    11/06/17 1045 11/06/17 1226 11/07/17 0504 11/07/17 1020  HGB 13.7  --   --   --   HCT 42.1  --   --   --   PLT 71*  --   --   --   LABPROT  --  31.1*  --  28.1*  INR  --  3.02  --  2.66  CREATININE 1.39*  --  1.44*  --   TROPONINI 0.08*  --   --   --    Estimated Creatinine Clearance: 40 mL/min (A) (by C-G formula based on SCr of 1.44 mg/dL (H)).  Medical History: Past Medical History:  Diagnosis Date  . Anemia   . Anxiety   . Aortic stenosis    Moderate  . Arthritis   . Atrial fibrillation ([place])   . Chronic systolic heart failure (HCC)    LVEF 40-45%  . CKD (chronic kidney disease) stage 3, GFR 30-59 ml/min (HCC)   . Colon cancer ([place])   . Colon polyps 12/2005   Per colonoscopy  . COPD (chronic obstructive pulmonary disease) ([place])   . Coronary atherosclerosis of native coronary artery    Multivessel status post CABG  . Emphysema   . Gastric erosions 12/2005   Per EGD - Dr. [name]  . GERD (gastroesophageal reflux disease)   . Hypothyroidism   . Ischemic cardiomyopathy   . Mitral regurgitation    Moderate  . Myocardial infarction [facility])    Remote  . Psoriasis   . Pulmonary nodules    Medications:  Medications Prior to Admission  Medication Sig Dispense Refill Last Dose  . albuterol (PROVENTIL) (2.5 MG/3ML) 0.083% nebulizer solution Inhale 3 mLs into the lungs every 4 (four) hours as needed for wheezing or shortness of breath.    Past Week at Unknown time  . carvedilol (COREG) 6.25 MG tablet TAKE (1) TABLET BY MOUTH TWICE DAILY WTIH A MEAL. 60 tablet 6 11/06/2017 at 0730  .  diltiazem (CARDIZEM CD) 120 MG 24 hr capsule TAKE (1) CAPSULE BY MOUTH TWICE DAILY. 180 capsule 3 11/06/2017 at Unknown time  . furosemide (LASIX) 20 MG tablet TAKE (1) TABLET BY MOUTH ONCE DAILY. 90 tablet 3 11/06/2017 at Unknown time  . Guaifenesin (MUCINEX MAXIMUM STRENGTH) 1200 MG TB12 Take 1 tablet by mouth 2 (two) times daily.    11/06/2017 at Unknown time  . HYDROcodone-acetaminophen (NORCO) 5-325 MG per tablet Take 1-2 tablets by mouth every 4 (four) hours as needed. (Patient taking differently: Take 1 tablet by mouth every 6 (six) hours as needed for moderate pain. ) 40 tablet 0 unknown  . Ipratropium-Albuterol (COMBIVENT RESPIMAT) 20-100 MCG/ACT AERS respimat Inhale 1 puff into the lungs as needed. (Patient taking differently: Inhale 1 puff into the lungs every 6 (six) hours as needed for wheezing or shortness of breath. ) 1 Inhaler 1 unknown  . loperamide (IMODIUM) 2 MG capsule Take 1 capsule (2 mg total) by mouth as needed for diarrhea or loose stools. (Patient taking differently: Take 4 mg by mouth daily. ) 20 capsule 2 11/06/2017 at Unknown time  . potassium chloride  SA (K-DUR,KLOR-CON) 20 MEQ tablet Take 0.5 tablets (10 mEq total) by mouth daily. (Patient taking differently: Take 20 mEq by mouth 2 (two) times daily. ) 90 tablet 3 11/05/2017 at Unknown time  . vitamin B-12 (CYANOCOBALAMIN) 1000 MCG tablet Take 1 tablet (1,000 mcg total) by mouth daily. 60 tablet 2 11/06/2017 at Unknown time  . warfarin (COUMADIN) 5 MG tablet Take 1/2 tablet daily except 1 tablet on Mondays, Wednesdays and Fridays 30 tablet 4 11/05/2017 at 1800   Assessment: Pt on chronic Coumadin for h/o afib.  INR slightly above goal on admission.  Last dose reported as taken on 1/25.  INR is now therapeutic.    Goal of Therapy:  INR 2-3 Monitor platelets by anticoagulation protocol: Yes   Plan: Coumadin 2.5mg  today x 1 INR daily  [place] Crane, Vee Bahe A 11/07/2017,11:37 AM

## 2017-11-07 NOTE — Progress Notes (Signed)
Discharge instructions gone over with patient and daughter, verbalized understanding. IV removed, patient tolerated procedure well. 

## 2017-11-07 NOTE — Progress Notes (Signed)
  Echocardiogram 2D Echocardiogram has been performed.  Aairah Negrette L Androw 11/07/2017, 10:34 AM

## 2017-11-24 ENCOUNTER — Ambulatory Visit (INDEPENDENT_AMBULATORY_CARE_PROVIDER_SITE_OTHER): Payer: Medicare Other | Admitting: *Deleted

## 2017-11-24 DIAGNOSIS — Z5181 Encounter for therapeutic drug level monitoring: Secondary | ICD-10-CM

## 2017-11-24 DIAGNOSIS — I4891 Unspecified atrial fibrillation: Secondary | ICD-10-CM

## 2017-11-24 DIAGNOSIS — I480 Paroxysmal atrial fibrillation: Secondary | ICD-10-CM | POA: Diagnosis not present

## 2017-11-24 LAB — POCT INR: INR: 2.6

## 2017-11-24 NOTE — Patient Instructions (Signed)
Continue coumadin 1/2 tablet daily except 1 tablet on Mondays, Wednesdays and Fridays Recheck in 4 weeks 

## 2017-12-22 ENCOUNTER — Ambulatory Visit (INDEPENDENT_AMBULATORY_CARE_PROVIDER_SITE_OTHER): Payer: Medicare Other | Admitting: *Deleted

## 2017-12-22 DIAGNOSIS — I4891 Unspecified atrial fibrillation: Secondary | ICD-10-CM

## 2017-12-22 DIAGNOSIS — Z5181 Encounter for therapeutic drug level monitoring: Secondary | ICD-10-CM

## 2017-12-22 DIAGNOSIS — I480 Paroxysmal atrial fibrillation: Secondary | ICD-10-CM | POA: Diagnosis not present

## 2017-12-22 LAB — POCT INR: INR: 2.5

## 2017-12-22 NOTE — Patient Instructions (Signed)
Continue coumadin 1/2 tablet daily except 1 tablet on Mondays, Wednesdays and Fridays Recheck in 6 weeks 

## 2018-02-02 ENCOUNTER — Ambulatory Visit (INDEPENDENT_AMBULATORY_CARE_PROVIDER_SITE_OTHER): Payer: Medicare Other | Admitting: *Deleted

## 2018-02-02 DIAGNOSIS — Z5181 Encounter for therapeutic drug level monitoring: Secondary | ICD-10-CM

## 2018-02-02 DIAGNOSIS — I480 Paroxysmal atrial fibrillation: Secondary | ICD-10-CM | POA: Diagnosis not present

## 2018-02-02 DIAGNOSIS — I4891 Unspecified atrial fibrillation: Secondary | ICD-10-CM | POA: Diagnosis not present

## 2018-02-02 LAB — POCT INR: INR: 2.6

## 2018-02-02 MED ORDER — WARFARIN SODIUM 5 MG PO TABS: ORAL_TABLET | ORAL | 4 refills | Status: DC

## 2018-02-02 NOTE — Patient Instructions (Signed)
Continue coumadin 1/2 tablet daily except 1 tablet on Mondays, Wednesdays and Fridays Recheck in 6 weeks 

## 2018-02-14 ENCOUNTER — Inpatient Hospital Stay (HOSPITAL_COMMUNITY): Payer: Medicare Other | Attending: Internal Medicine

## 2018-02-14 DIAGNOSIS — D696 Thrombocytopenia, unspecified: Secondary | ICD-10-CM | POA: Insufficient documentation

## 2018-02-14 DIAGNOSIS — E538 Deficiency of other specified B group vitamins: Secondary | ICD-10-CM | POA: Insufficient documentation

## 2018-02-14 DIAGNOSIS — D472 Monoclonal gammopathy: Secondary | ICD-10-CM | POA: Diagnosis present

## 2018-02-14 LAB — COMPREHENSIVE METABOLIC PANEL
ALT: 9 U/L — AB (ref 17–63)
AST: 16 U/L (ref 15–41)
Albumin: 3.9 g/dL (ref 3.5–5.0)
Alkaline Phosphatase: 96 U/L (ref 38–126)
Anion gap: 6 (ref 5–15)
BILIRUBIN TOTAL: 1.4 mg/dL — AB (ref 0.3–1.2)
BUN: 20 mg/dL (ref 6–20)
CHLORIDE: 105 mmol/L (ref 101–111)
CO2: 26 mmol/L (ref 22–32)
CREATININE: 1.35 mg/dL — AB (ref 0.61–1.24)
Calcium: 8.7 mg/dL — ABNORMAL LOW (ref 8.9–10.3)
GFR calc Af Amer: 56 mL/min — ABNORMAL LOW (ref >60)
GFR, EST NON AFRICAN AMERICAN: 49 mL/min — AB (ref >60)
GLUCOSE: 99 mg/dL (ref 65–99)
Potassium: 4.6 mmol/L (ref 3.5–5.1)
Sodium: 137 mmol/L (ref 135–145)
TOTAL PROTEIN: 7.3 g/dL (ref 6.5–8.1)

## 2018-02-14 LAB — CBC WITH DIFFERENTIAL/PLATELET
BASOS ABS: 0 10*3/uL (ref 0.0–0.1)
Basophils Relative: 1 %
Eosinophils Absolute: 0.4 10*3/uL (ref 0.0–0.7)
Eosinophils Relative: 10 %
HEMATOCRIT: 39.1 % (ref 39.0–52.0)
Hemoglobin: 12.4 g/dL — ABNORMAL LOW (ref 13.0–17.0)
LYMPHS PCT: 22 %
Lymphs Abs: 0.9 10*3/uL (ref 0.7–4.0)
MCH: 29.3 pg (ref 26.0–34.0)
MCHC: 31.7 g/dL (ref 30.0–36.0)
MCV: 92.4 fL (ref 78.0–100.0)
Monocytes Absolute: 0.4 10*3/uL (ref 0.1–1.0)
Monocytes Relative: 10 %
NEUTROS ABS: 2.2 10*3/uL (ref 1.7–7.7)
NEUTROS PCT: 57 %
Platelets: 61 10*3/uL — ABNORMAL LOW (ref 150–400)
RBC: 4.23 MIL/uL (ref 4.22–5.81)
RDW: 15.2 % (ref 11.5–15.5)
WBC: 3.9 10*3/uL — AB (ref 4.0–10.5)

## 2018-02-15 LAB — IGG, IGA, IGM
IGA: 290 mg/dL (ref 61–437)
IgG (Immunoglobin G), Serum: 1277 mg/dL (ref 700–1600)
IgM (Immunoglobulin M), Srm: 84 mg/dL (ref 15–143)

## 2018-02-15 LAB — KAPPA/LAMBDA LIGHT CHAINS
Kappa free light chain: 46 mg/L — ABNORMAL HIGH (ref 3.3–19.4)
Kappa, lambda light chain ratio: 1.39 (ref 0.26–1.65)
LAMDA FREE LIGHT CHAINS: 33.1 mg/L — AB (ref 5.7–26.3)

## 2018-02-15 LAB — IMMUNOFIXATION ELECTROPHORESIS
IGA: 290 mg/dL (ref 61–437)
IGG (IMMUNOGLOBIN G), SERUM: 1315 mg/dL (ref 700–1600)
IGM (IMMUNOGLOBULIN M), SRM: 85 mg/dL (ref 15–143)
Total Protein ELP: 6.9 g/dL (ref 6.0–8.5)

## 2018-02-15 LAB — BETA 2 MICROGLOBULIN, SERUM: Beta-2 Microglobulin: 3.1 mg/L — ABNORMAL HIGH (ref 0.6–2.4)

## 2018-02-16 LAB — MULTIPLE MYELOMA PANEL, SERUM
ALBUMIN/GLOB SERPL: 1.2 (ref 0.7–1.7)
ALPHA 1: 0.3 g/dL (ref 0.0–0.4)
Albumin SerPl Elph-Mcnc: 3.7 g/dL (ref 2.9–4.4)
Alpha2 Glob SerPl Elph-Mcnc: 0.6 g/dL (ref 0.4–1.0)
B-Globulin SerPl Elph-Mcnc: 0.9 g/dL (ref 0.7–1.3)
GLOBULIN, TOTAL: 3.2 g/dL (ref 2.2–3.9)
Gamma Glob SerPl Elph-Mcnc: 1.3 g/dL (ref 0.4–1.8)
IGA: 299 mg/dL (ref 61–437)
IgG (Immunoglobin G), Serum: 1276 mg/dL (ref 700–1600)
IgM (Immunoglobulin M), Srm: 90 mg/dL (ref 15–143)
Total Protein ELP: 6.9 g/dL (ref 6.0–8.5)

## 2018-02-16 LAB — PROTEIN ELECTROPHORESIS, SERUM
A/G RATIO SPE: 1.2 (ref 0.7–1.7)
ALBUMIN ELP: 3.7 g/dL (ref 2.9–4.4)
ALPHA-1-GLOBULIN: 0.3 g/dL (ref 0.0–0.4)
ALPHA-2-GLOBULIN: 0.6 g/dL (ref 0.4–1.0)
BETA GLOBULIN: 0.9 g/dL (ref 0.7–1.3)
Gamma Globulin: 1.2 g/dL (ref 0.4–1.8)
Globulin, Total: 3 g/dL (ref 2.2–3.9)
Total Protein ELP: 6.7 g/dL (ref 6.0–8.5)

## 2018-02-21 ENCOUNTER — Encounter (HOSPITAL_COMMUNITY): Payer: Self-pay | Admitting: Internal Medicine

## 2018-02-21 ENCOUNTER — Inpatient Hospital Stay (HOSPITAL_COMMUNITY): Payer: Medicare Other | Admitting: Internal Medicine

## 2018-02-21 VITALS — BP 134/76 | HR 64 | Temp 97.6°F | Resp 18 | Wt 160.5 lb

## 2018-02-21 DIAGNOSIS — D472 Monoclonal gammopathy: Secondary | ICD-10-CM

## 2018-02-21 DIAGNOSIS — E538 Deficiency of other specified B group vitamins: Secondary | ICD-10-CM

## 2018-02-21 DIAGNOSIS — D696 Thrombocytopenia, unspecified: Secondary | ICD-10-CM | POA: Diagnosis not present

## 2018-02-21 NOTE — Patient Instructions (Signed)
[facility] at [facility]  Discharge Instructions:  You were seen by dr. higgs today.  _______________________________________________________________  Thank you for choosing [facility] at [facility] to provide your oncology and hematology care.  To afford each patient quality time with our providers, please arrive at least 15 minutes before your scheduled appointment.  You need to re-schedule your appointment if you arrive 10 or more minutes late.  We strive to give you quality time with our providers, and arriving late affects you and other patients whose appointments are after yours.  Also, if you no show three or more times for appointments you may be dismissed from the clinic.  Again, thank you for choosing [facility] at [facility]. Our hope is that these requests will allow you access to exceptional care and in a timely manner. _______________________________________________________________  If you have questions after your visit, please contact our office at [phone] between the hours of 8:30 a.m. and 5:00 p.m. Voicemails left after 4:30 p.m. will not be returned until the following business day. _______________________________________________________________  For prescription refill requests, have your pharmacy contact our office. _______________________________________________________________  Recommendations made by the consultant and any test results will be sent to your referring physician. _______________________________________________________________ 

## 2018-02-21 NOTE — Progress Notes (Signed)
Diagnosis MGUS (monoclonal gammopathy of unknown significance) - Plan: CBC with Differential/Platelet, Comprehensive metabolic panel, Lactate dehydrogenase, Protein electrophoresis, serum, Vitamin B12, Kappa/lambda light chains, IgG, IgA, IgM  Staging Cancer Staging No matching staging information was found for the patient.  Assessment and Plan: 1.  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  Labs done 02/14/2018 shows WBC 3.9 HB 12.4 plts 61,000.  Plt count has remained > 50K.  Pt had bone marrow bx done 03/02/2017 that showed abundant megakaryocytes. Will continue to monitor labs and he will have repeat labs in 08/2018.  He as advised to avoid NSAIDS and ASA.    2.  B12 deficiency.  Continue oral B12 supplement at this time. Hb 12.4.    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  Pt had 6% plasma cells on bone marrow biopsy done 03/02/2017.   SPEP done 02/14/2018 negative for monoclonal paraprotein. Kappa/lambda light chain ratio 1.39.   normal at 0.75. Beta  2 microglobulin was elevated likely due to his underlying renal disease.  Pt will have repeat labs in 08/2018.   INTERVAL HISTORY:  77 yr old male previously followed by Dr. 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  Current Status:  Pt is seen today for follow-up and is here to go over labs.    1.  PATHOLOGY: BM biopsy done 03/02/2017:   FINAL DIAGNOSIS Diagnosis Bone Marrow, Aspirate,Biopsy, and Clot, right iliac - SLIGHTLY HYPERCELLULAR BONE MARROW FOR AGE WITH TRILINEAGE HEMATOPOIESIS. - SLIGHT PLASMACYTOSIS (PLASMA CELLS 6%). - SEE COMMENT. PERIPHERAL BLOOD: - THROMBOCYTOPENIA.  Diagnosis Note The bone marrow is slightly hypercellular for age with trilineage hematopoiesis including abundant megakaryocytes displaying normal morphology. In the presence of thrombocytopenia, the bone marrow findings likely  represent secondary changes due to peripheral platelet destruction, sequestration or consumption. In this background, there is slight plasmacytosis present (plasma cells 6%). Immunohistochemical stains show that the plasma cells display kappa light chain excess. The findings are limited but most suggestive of early involvement by plasma cell dyscrasia/neoplasm. Correlation with cytogenetic studies, immunofixation   Problem List Patient Active Problem List   Diagnosis Date Noted  . CHF exacerbation ([place]) [I50.9] 11/06/2017  . Cellulitis of left thigh [W09.811] 11/06/2017  . Leukopenia [D72.819] 02/11/2017  . COPD mixed type ([place]) [J44.9] 08/11/2015  . Atrial fibrillation with rapid ventricular response ([place]) [I48.91]   . Thrombocytopenia ([place]) [D69.6] 08/10/2015  . Chronic atrial fibrillation ([place]) [I48.2] 08/08/2015  . Bronchospasm [J98.01] 10/05/2014  . Acute on chronic combined systolic and diastolic congestive heart failure ([place]) [I50.43] 10/03/2014  . Encounter for therapeutic drug monitoring [Z51.81] 12/27/2013  . History of colon cancer [Z85.038]   . Colon cancer ([place]) [C18.9] 11/21/2013  . Coronary atherosclerosis of native coronary artery [I25.10] 06/15/2013  . Aortic stenosis [I35.0] 06/15/2013  . Mitral regurgitation [I34.0] 06/15/2013  . CKD (chronic kidney disease) stage 3, GFR 30-59 ml/min (HCC) [N18.3] 06/15/2013  . Long term current use of anticoagulant therapy [Z79.01] 12/20/2012  . Systolic CHF, chronic ([place]) [I50.22] 02/06/2012    Past Medical History Past Medical History:  Diagnosis Date  . Anemia   . Anxiety   . Aortic stenosis    Moderate  . Arthritis   . Atrial fibrillation ([place])   . Chronic systolic heart failure (HCC)    LVEF 40-45%  . CKD (chronic kidney disease) stage 3, GFR 30-59 ml/min (HCC)   .  Colon cancer ([place])   . Colon polyps 12/2005   Per colonoscopy  . COPD (chronic obstructive pulmonary disease) ([place])   . Coronary atherosclerosis of  native coronary artery    Multivessel status post CABG  . Emphysema   . Gastric erosions 12/2005   Per EGD - Dr. [name]  . GERD (gastroesophageal reflux disease)   . Hypothyroidism   . Ischemic cardiomyopathy   . Mitral regurgitation    Moderate  . Myocardial infarction [facility])    Remote  . Psoriasis   . Pulmonary nodules     Past Surgical History Past Surgical History:  Procedure Laterality Date  . COLECTOMY N/A 12/13/2013   Procedure: TOTAL COLECTOMY;  Surgeon: [name], MD;  Location: [facility] ORS;  Service: General;  Laterality: N/A;  . CORONARY ARTERY BYPASS GRAFT  March 2004   LIMA to LAD, SVG to diagonal, SVG to OM 2, SVG to RCA  . FLEXIBLE SIGMOIDOSCOPY  10/25/2012   Procedure: FLEXIBLE SIGMOIDOSCOPY;  Surgeon: [name], MD;  Location: [facility] ENDO SUITE;  Service: Gastroenterology;  Laterality: N/A;  . PARTIAL COLECTOMY  10/26/2012   Procedure: PARTIAL COLECTOMY;  Surgeon: [name], MD;  Location: [facility] ORS;  Service: General;  Laterality: N/A;  Closure of Colovesical Fistula    Family History Family History  Problem Relation Age of Onset  . Cancer Brother   . Heart Problems Mother        unknown  . Hypertension Other      Social History  reports that he quit smoking about 14 years ago. His smoking use included cigarettes. He has a 30.00 pack-year smoking history. He has never used smokeless tobacco. He reports that he does not drink alcohol or use drugs.  Medications  Current Outpatient Medications:  .  albuterol (PROVENTIL) (2.5 MG/3ML) 0.083% nebulizer solution, Inhale 3 mLs into the lungs every 4 (four) hours as needed for wheezing or shortness of breath. , Disp: , Rfl:  .  carvedilol (COREG) 6.25 MG tablet, TAKE (1) TABLET BY MOUTH TWICE DAILY WTIH A MEAL., Disp: 60 tablet, Rfl: 6 .  diltiazem (CARDIZEM CD) 120 MG 24 hr capsule, TAKE (1) CAPSULE BY MOUTH TWICE DAILY., Disp: 180 capsule, Rfl: 3 .  furosemide (LASIX) 20 MG tablet, Take 2 tablets (40 mg total) by  mouth daily., Disp: 90 tablet, Rfl: 3 .  Guaifenesin (MUCINEX MAXIMUM STRENGTH) 1200 MG TB12, Take 1 tablet by mouth 2 (two) times daily. , Disp: , Rfl:  .  HYDROcodone-acetaminophen (NORCO) 5-325 MG per tablet, Take 1-2 tablets by mouth every 4 (four) hours as needed. (Patient taking differently: Take 1 tablet by mouth every 6 (six) hours as needed for moderate pain. ), Disp: 40 tablet, Rfl: 0 .  Ipratropium-Albuterol (COMBIVENT RESPIMAT) 20-100 MCG/ACT AERS respimat, Inhale 1 puff into the lungs as needed. (Patient taking differently: Inhale 1 puff into the lungs every 6 (six) hours as needed for wheezing or shortness of breath. ), Disp: 1 Inhaler, Rfl: 1 .  loperamide (IMODIUM) 2 MG capsule, Take 1 capsule (2 mg total) by mouth as needed for diarrhea or loose stools. (Patient taking differently: Take 4 mg by mouth daily. ), Disp: 20 capsule, Rfl: 2 .  potassium chloride SA (K-DUR,KLOR-CON) 20 MEQ tablet, Take 0.5 tablets (10 mEq total) by mouth daily. (Patient taking differently: Take 20 mEq by mouth 2 (two) times daily. ), Disp: 90 tablet, Rfl: 3 .  vitamin B-12 (CYANOCOBALAMIN) 1000 MCG tablet, Take 1 tablet (1,000 mcg  total) by mouth daily., Disp: 60 tablet, Rfl: 2 .  warfarin (COUMADIN) 5 MG tablet, Take 1/2 tablet daily except 1 tablet on Mondays, Wednesdays and Fridays, Disp: 30 tablet, Rfl: 4  Allergies Patient has no known allergies.  Review of Systems Review of Systems - Oncology ROS as per HPI otherwise 12 point ROS is negative.   Physical Exam  Vitals Wt Readings from Last 3 Encounters:  02/21/18 160 lb 8 oz (72.8 kg)  11/07/17 147 lb 7.8 oz (66.9 kg)  10/22/17 166 lb 6.4 oz (75.5 kg)   Temp Readings from Last 3 Encounters:  02/21/18 97.6 F (36.4 C) (Oral)  11/07/17 (!) 97.3 F (36.3 C) (Oral)  10/22/17 (!) 97.4 F (36.3 C) (Oral)   BP Readings from Last 3 Encounters:  02/21/18 134/76  11/07/17 120/70  10/22/17 118/71   Pulse Readings from Last 3 Encounters:   02/21/18 64  11/07/17 86  10/22/17 82    Constitutional: Well-developed, well-nourished, and in no distress.   HENT: Head: Normocephalic and atraumatic.  Mouth/Throat: No oropharyngeal exudate. Mucosa moist. Eyes: Pupils are equal, round, and reactive to light. Conjunctivae are normal. No scleral icterus.  Neck: Normal range of motion. Neck supple. No JVD present.  Cardiovascular: Normal rate, regular rhythm and normal heart sounds.  Exam reveals no gallop and no friction rub.   No murmur heard. Pulmonary/Chest: Effort normal and breath sounds normal. No respiratory distress. No wheezes.No rales.  Abdominal: Soft. Bowel sounds are normal. No distension. There is no tenderness. There is no guarding.  Musculoskeletal: No edema or tenderness.  Lymphadenopathy: No cervical, axillary or supraclavicular adenopathy.  Neurological: Alert and oriented to person, place, and time. No cranial nerve deficit.  Skin: Skin is warm and dry. No rash noted. No erythema. No pallor.  Psychiatric: Affect and judgment normal.   Labs No visits with results within 3 Day(s) from this visit.  Latest known visit with results is:  Appointment on 02/14/2018  Component Date Value Ref Range Status  . Kappa free light chain 02/14/2018 46.0* 3.3 - 19.4 mg/L Final  . Lamda free light chains 02/14/2018 33.1* 5.7 - 26.3 mg/L Final  . Kappa, lamda light chain ratio 02/14/2018 1.39  0.26 - 1.65 Final   Comment: (NOTE) Performed At: [facility] [place], [place] [id number] Rush Farmer MD [id number] Performed at [facility] [address], [place], [place] [address]   . Beta-2 Microglobulin 02/14/2018 3.1* 0.6 - 2.4 mg/L Final   Comment: (NOTE) Siemens Immulite 2000 Immunochemiluminometric assay (ICMA) Values obtained with different assay methods or kits cannot be used interchangeably. Results cannot be interpreted as absolute evidence of the presence or absence of malignant  disease. Performed At: [facility] [place], [place] [id number] Rush Farmer MD [id number] Performed at [facility], [address], [place], [place] [address]   . IgG (Immunoglobin G), Serum 02/14/2018 1,277  700 - 1,600 mg/dL Final  . IgA 02/14/2018 290  61 - 437 mg/dL Final  . IgM (Immunoglobulin M), Srm 02/14/2018 84  15 - 143 mg/dL Final   Comment: (NOTE) Performed At: [facility] [place], [place] [id number] Rush Farmer MD [id number] Performed at [facility] [place], [address], [place], [place] [address]   . Total Protein ELP 02/14/2018 6.7  6.0 - 8.5 g/dL Final  . Albumin ELP 02/14/2018 3.7  2.9 - 4.4 g/dL Final  . Alpha-1-Globulin 02/14/2018 0.3  0.0 - 0.4 g/dL Final  . Alpha-2-Globulin  02/14/2018 0.6  0.4 - 1.0 g/dL Final  . Beta Globulin 02/14/2018 0.9  0.7 - 1.3 g/dL Final  . Gamma Globulin 02/14/2018 1.2  0.4 - 1.8 g/dL Final  . M-Spike, % 02/14/2018 Not Observed  Not Observed g/dL Final  . SPE Interp. 02/14/2018 Comment   Final   Comment: (NOTE) The SPE pattern appears essentially unremarkable. Evidence of monoclonal protein is not apparent. Performed At: [facility] [place], [place] [id number] Rush Farmer MD [id number]   . Comment 02/14/2018 Comment   Final   Comment: (NOTE) Protein electrophoresis scan will follow via computer, mail, or courier delivery.   [name] GLOBULIN, TOTAL 02/14/2018 3.0  2.2 - 3.9 g/dL Corrected  . A/G Ratio 02/14/2018 1.2  0.7 - 1.7 Corrected   Performed at [facility], [address] [place], [place] [address]  . Total Protein ELP 02/14/2018 6.9  6.0 - 8.5 g/dL Final  . IgG (Immunoglobin G), Serum 02/14/2018 1,315  700 - 1,600 mg/dL Final  . IgA 02/14/2018 290  61 - 437 mg/dL Final  . IgM (Immunoglobulin M), Srm 02/14/2018 85  15 - 143 mg/dL Final   Comment: (NOTE) Performed At: [facility] [address] [place], [place] [id number] Rush Farmer MD [id number]   . Immunofixation Result, Serum 02/14/2018 Comment   Corrected   Comment: An apparent normal immunofixation pattern. Performed at [facility], [address], [place], [place] [address]   . IgG (Immunoglobin G), Serum 02/14/2018 1,276  700 - 1,600 mg/dL Final  . IgA 02/14/2018 299  61 - 437 mg/dL Final  . IgM (Immunoglobulin M), Srm 02/14/2018 90  15 - 143 mg/dL Final  . Total Protein ELP 02/14/2018 6.9  6.0 - 8.5 g/dL Corrected  . Albumin SerPl Elph-Mcnc 02/14/2018 3.7  2.9 - 4.4 g/dL Corrected  . Alpha 1 02/14/2018 0.3  0.0 - 0.4 g/dL Corrected  . Alpha2 Glob SerPl Elph-Mcnc 02/14/2018 0.6  0.4 - 1.0 g/dL Corrected  . B-Globulin SerPl Elph-Mcnc 02/14/2018 0.9  0.7 - 1.3 g/dL Corrected  . Gamma Glob SerPl Elph-Mcnc 02/14/2018 1.3  0.4 - 1.8 g/dL Corrected  . M Protein SerPl Elph-Mcnc 02/14/2018 Not Observed  Not Observed g/dL Corrected  . Globulin, Total 02/14/2018 3.2  2.2 - 3.9 g/dL Corrected  . Albumin/Glob SerPl 02/14/2018 1.2  0.7 - 1.7 Corrected  . IFE 1 02/14/2018 Comment   Corrected   An apparent normal immunofixation pattern.  . Please Note 02/14/2018 Comment   Corrected   Comment: (NOTE) Protein electrophoresis scan will follow via computer, mail, or courier delivery. Performed At: [facility] [place], [place] [id number] Rush Farmer MD [id number] Performed at [facility], [address] [place], [place] [address]   . WBC 02/14/2018 3.9* 4.0 - 10.5 K/uL Final  . RBC 02/14/2018 4.23  4.22 - 5.81 MIL/uL Final  . Hemoglobin 02/14/2018 12.4* 13.0 - 17.0 g/dL Final  . HCT 02/14/2018 39.1  39.0 - 52.0 % Final  . MCV 02/14/2018 92.4  78.0 - 100.0 fL Final  . MCH 02/14/2018 29.3  26.0 - 34.0 pg Final  . MCHC 02/14/2018 31.7  30.0 - 36.0 g/dL Final  . RDW 02/14/2018 15.2  11.5 - 15.5 % Final  . Platelets 02/14/2018 61* 150 - 400 K/uL Final   Comment: SPECIMEN CHECKED FOR CLOTS PLATELET COUNT CONFIRMED BY SMEAR   .  Neutrophils Relative % 02/14/2018 57  % Final  . Neutro Abs 02/14/2018 2.2  1.7 -  7.7 K/uL Final  . Lymphocytes Relative 02/14/2018 22  % Final  . Lymphs Abs 02/14/2018 0.9  0.7 - 4.0 K/uL Final  . Monocytes Relative 02/14/2018 10  % Final  . Monocytes Absolute 02/14/2018 0.4  0.1 - 1.0 K/uL Final  . Eosinophils Relative 02/14/2018 10  % Final  . Eosinophils Absolute 02/14/2018 0.4  0.0 - 0.7 K/uL Final  . Basophils Relative 02/14/2018 1  % Final  . Basophils Absolute 02/14/2018 0.0  0.0 - 0.1 K/uL Final   Performed at [facility], [address] [place], [place] [address]  . Sodium 02/14/2018 137  135 - 145 mmol/L Final  . Potassium 02/14/2018 4.6  3.5 - 5.1 mmol/L Final  . Chloride 02/14/2018 105  101 - 111 mmol/L Final  . CO2 02/14/2018 26  22 - 32 mmol/L Final  . Glucose, Bld 02/14/2018 99  65 - 99 mg/dL Final  . BUN 02/14/2018 20  6 - 20 mg/dL Final  . Creatinine, Ser 02/14/2018 1.35* 0.61 - 1.24 mg/dL Final  . Calcium 02/14/2018 8.7* 8.9 - 10.3 mg/dL Final  . Total Protein 02/14/2018 7.3  6.5 - 8.1 g/dL Final  . Albumin 02/14/2018 3.9  3.5 - 5.0 g/dL Final  . AST 02/14/2018 16  15 - 41 U/L Final  . ALT 02/14/2018 9* 17 - 63 U/L Final  . Alkaline Phosphatase 02/14/2018 96  38 - 126 U/L Final  . Total Bilirubin 02/14/2018 1.4* 0.3 - 1.2 mg/dL Final  . GFR calc non Af Amer 02/14/2018 49* >60 mL/min Final  . GFR calc Af Amer 02/14/2018 56* >60 mL/min Final   Comment: (NOTE) The eGFR has been calculated using the CKD EPI equation. This calculation has not been validated in all clinical situations. eGFR's persistently <60 mL/min signify possible Chronic Kidney Disease.   Georgiann Hahn gap 02/14/2018 6  5 - 15 Final   Performed at [facility], [address] [place], [place] [address]     Pathology Orders Placed This Encounter  Procedures  . CBC with Differential/Platelet    Standing Status:   Future    Standing Expiration Date:   02/22/2019  . Comprehensive metabolic panel     Standing Status:   Future    Standing Expiration Date:   02/22/2019  . Lactate dehydrogenase    Standing Status:   Future    Standing Expiration Date:   02/22/2019  . Protein electrophoresis, serum    Standing Status:   Future    Standing Expiration Date:   02/22/2019  . Vitamin B12    Standing Status:   Future    Standing Expiration Date:   02/22/2019  . Kappa/lambda light chains    Standing Status:   Future    Standing Expiration Date:   02/22/2019  . IgG, IgA, IgM    Standing Status:   Future    Standing Expiration Date:   02/22/2019       Zoila Shutter MD

## 2018-03-16 ENCOUNTER — Ambulatory Visit (INDEPENDENT_AMBULATORY_CARE_PROVIDER_SITE_OTHER): Payer: Medicare Other | Admitting: *Deleted

## 2018-03-16 DIAGNOSIS — I4891 Unspecified atrial fibrillation: Secondary | ICD-10-CM | POA: Diagnosis not present

## 2018-03-16 DIAGNOSIS — Z5181 Encounter for therapeutic drug level monitoring: Secondary | ICD-10-CM | POA: Diagnosis not present

## 2018-03-16 DIAGNOSIS — I480 Paroxysmal atrial fibrillation: Secondary | ICD-10-CM

## 2018-03-16 LAB — POCT INR: INR: 2.1 (ref 2.0–3.0)

## 2018-03-16 MED ORDER — CARVEDILOL 6.25 MG PO TABS: ORAL_TABLET | ORAL | 6 refills | Status: AC

## 2018-03-16 NOTE — Patient Instructions (Signed)
Continue coumadin 1/2 tablet daily except 1 tablet on Mondays, Wednesdays and Fridays Recheck in 6 weeks 

## 2018-04-27 ENCOUNTER — Ambulatory Visit (INDEPENDENT_AMBULATORY_CARE_PROVIDER_SITE_OTHER): Payer: Medicare Other | Admitting: *Deleted

## 2018-04-27 DIAGNOSIS — Z5181 Encounter for therapeutic drug level monitoring: Secondary | ICD-10-CM | POA: Diagnosis not present

## 2018-04-27 DIAGNOSIS — I4891 Unspecified atrial fibrillation: Secondary | ICD-10-CM | POA: Diagnosis not present

## 2018-04-27 DIAGNOSIS — I480 Paroxysmal atrial fibrillation: Secondary | ICD-10-CM | POA: Diagnosis not present

## 2018-04-27 LAB — POCT INR: INR: 2.3 (ref 2.0–3.0)

## 2018-04-27 NOTE — Patient Instructions (Signed)
Continue coumadin 1/2 tablet daily except 1 tablet on Mondays, Wednesdays and Fridays Recheck in 6 weeks 

## 2018-06-13 ENCOUNTER — Other Ambulatory Visit: Payer: Self-pay | Admitting: Cardiology

## 2018-07-11 ENCOUNTER — Ambulatory Visit (INDEPENDENT_AMBULATORY_CARE_PROVIDER_SITE_OTHER): Payer: Medicare Other | Admitting: *Deleted

## 2018-07-11 DIAGNOSIS — I4891 Unspecified atrial fibrillation: Secondary | ICD-10-CM

## 2018-07-11 DIAGNOSIS — I480 Paroxysmal atrial fibrillation: Secondary | ICD-10-CM

## 2018-07-11 DIAGNOSIS — Z5181 Encounter for therapeutic drug level monitoring: Secondary | ICD-10-CM

## 2018-07-11 LAB — POCT INR: INR: 2 (ref 2.0–3.0)

## 2018-07-11 NOTE — Patient Instructions (Signed)
Take coumadin 1 1/2 tablets tonight then resume 1/2 tablet daily except 1 tablet on Mondays, Wednesdays and Fridays Recheck in 6 weeks 

## 2018-07-13 ENCOUNTER — Other Ambulatory Visit: Payer: Self-pay | Admitting: Cardiology

## 2018-07-14 ENCOUNTER — Other Ambulatory Visit (HOSPITAL_COMMUNITY): Payer: Self-pay | Admitting: Physician Assistant

## 2018-07-14 ENCOUNTER — Ambulatory Visit (HOSPITAL_COMMUNITY)
Admission: RE | Admit: 2018-07-14 | Discharge: 2018-07-14 | Disposition: A | Discharge status: Home / Self Care | Payer: Medicare Other | Source: Ambulatory Visit | Attending: Physician Assistant | Admitting: Physician Assistant

## 2018-07-14 DIAGNOSIS — M79671 Pain in right foot: Secondary | ICD-10-CM | POA: Insufficient documentation

## 2018-08-04 ENCOUNTER — Other Ambulatory Visit: Payer: Self-pay | Admitting: Cardiology

## 2018-08-04 MED ORDER — FUROSEMIDE 20 MG PO TABS: 20.0000 mg | ORAL_TABLET | Freq: Every day | ORAL | 1 refills | Status: DC

## 2018-08-04 NOTE — Telephone Encounter (Signed)
08/25/17 office note states lasix 20 mg daily, that is what was refilled

## 2018-08-04 NOTE — Telephone Encounter (Signed)
Needing refill for furosemide (LASIX) 20 MG tablet [[id number]]  Sent to [place], scheduled to see [name] on 09/01/18

## 2018-08-07 ENCOUNTER — Emergency Department (HOSPITAL_COMMUNITY)
Admission: EM | Admit: 2018-08-07 | Discharge: 2018-08-07 | Disposition: A | Discharge status: Home / Self Care | Payer: Medicare Other | Attending: Emergency Medicine | Admitting: Emergency Medicine

## 2018-08-07 ENCOUNTER — Emergency Department (HOSPITAL_COMMUNITY): Payer: Medicare Other

## 2018-08-07 ENCOUNTER — Encounter (HOSPITAL_COMMUNITY): Payer: Self-pay | Admitting: Emergency Medicine

## 2018-08-07 ENCOUNTER — Other Ambulatory Visit: Payer: Self-pay

## 2018-08-07 DIAGNOSIS — N183 Chronic kidney disease, stage 3 (moderate): Secondary | ICD-10-CM | POA: Insufficient documentation

## 2018-08-07 DIAGNOSIS — E039 Hypothyroidism, unspecified: Secondary | ICD-10-CM | POA: Insufficient documentation

## 2018-08-07 DIAGNOSIS — I252 Old myocardial infarction: Secondary | ICD-10-CM | POA: Diagnosis not present

## 2018-08-07 DIAGNOSIS — R600 Localized edema: Secondary | ICD-10-CM | POA: Diagnosis not present

## 2018-08-07 DIAGNOSIS — R0602 Shortness of breath: Secondary | ICD-10-CM | POA: Diagnosis not present

## 2018-08-07 DIAGNOSIS — J449 Chronic obstructive pulmonary disease, unspecified: Secondary | ICD-10-CM | POA: Insufficient documentation

## 2018-08-07 DIAGNOSIS — I2510 Atherosclerotic heart disease of native coronary artery without angina pectoris: Secondary | ICD-10-CM | POA: Diagnosis not present

## 2018-08-07 DIAGNOSIS — Z7901 Long term (current) use of anticoagulants: Secondary | ICD-10-CM | POA: Diagnosis not present

## 2018-08-07 DIAGNOSIS — I5022 Chronic systolic (congestive) heart failure: Secondary | ICD-10-CM | POA: Diagnosis not present

## 2018-08-07 DIAGNOSIS — Z87891 Personal history of nicotine dependence: Secondary | ICD-10-CM | POA: Diagnosis not present

## 2018-08-07 DIAGNOSIS — Z85038 Personal history of other malignant neoplasm of large intestine: Secondary | ICD-10-CM | POA: Diagnosis not present

## 2018-08-07 DIAGNOSIS — R609 Edema, unspecified: Secondary | ICD-10-CM

## 2018-08-07 LAB — CBC WITH DIFFERENTIAL/PLATELET
Abs Immature Granulocytes: 0.01 10*3/uL (ref 0.00–0.07)
BASOS ABS: 0 10*3/uL (ref 0.0–0.1)
BASOS PCT: 1 %
EOS ABS: 0.2 10*3/uL (ref 0.0–0.5)
EOS PCT: 5 %
HCT: 43.3 % (ref 39.0–52.0)
Hemoglobin: 13.8 g/dL (ref 13.0–17.0)
IMMATURE GRANULOCYTES: 0 %
Lymphocytes Relative: 22 %
Lymphs Abs: 1 10*3/uL (ref 0.7–4.0)
MCH: 31.7 pg (ref 26.0–34.0)
MCHC: 31.9 g/dL (ref 30.0–36.0)
MCV: 99.3 fL (ref 80.0–100.0)
Monocytes Absolute: 0.4 10*3/uL (ref 0.1–1.0)
Monocytes Relative: 8 %
NEUTROS PCT: 64 %
NRBC: 0 % (ref 0.0–0.2)
Neutro Abs: 2.9 10*3/uL (ref 1.7–7.7)
PLATELETS: 64 10*3/uL — AB (ref 150–400)
RBC: 4.36 MIL/uL (ref 4.22–5.81)
RDW: 14.6 % (ref 11.5–15.5)
WBC: 4.5 10*3/uL (ref 4.0–10.5)

## 2018-08-07 LAB — COMPREHENSIVE METABOLIC PANEL
ALBUMIN: 4.7 g/dL (ref 3.5–5.0)
ALT: 9 U/L (ref 0–44)
AST: 14 U/L — AB (ref 15–41)
Alkaline Phosphatase: 107 U/L (ref 38–126)
Anion gap: 9 (ref 5–15)
BILIRUBIN TOTAL: 1.7 mg/dL — AB (ref 0.3–1.2)
BUN: 14 mg/dL (ref 8–23)
CALCIUM: 9.4 mg/dL (ref 8.9–10.3)
CHLORIDE: 100 mmol/L (ref 98–111)
CO2: 30 mmol/L (ref 22–32)
CREATININE: 1.32 mg/dL — AB (ref 0.61–1.24)
GFR calc Af Amer: 58 mL/min — ABNORMAL LOW (ref >60)
GFR calc non Af Amer: 50 mL/min — ABNORMAL LOW (ref >60)
GLUCOSE: 107 mg/dL — AB (ref 70–99)
POTASSIUM: 3.7 mmol/L (ref 3.5–5.1)
SODIUM: 139 mmol/L (ref 135–145)
Total Protein: 8.2 g/dL — ABNORMAL HIGH (ref 6.5–8.1)

## 2018-08-07 LAB — BRAIN NATRIURETIC PEPTIDE: B NATRIURETIC PEPTIDE 5: 459 pg/mL — AB (ref 0.0–100.0)

## 2018-08-07 LAB — TROPONIN I

## 2018-08-07 NOTE — ED Provider Notes (Signed)
[facility] EMERGENCY DEPARTMENT Provider Note   CSN: [id number] Arrival date & time: 08/07/18  1237     History   Chief Complaint Chief Complaint  Patient presents with  . Shortness of Breath    HPI Cory Ryan is a 77 y.o. male.  Patient is concerned about swelling in his legs and questionable dyspnea for 1 week.  No substernal chest pain, fever, cough, sweats, chills, nausea.  No prolonged travel or immobilization.  Past medical history reviewed and includes aortic stenosis, heart failure, chronic kidney disease, CAD, ischemic cardiomyopathy, many others.  Severity of symptoms is minimal.  Nothing makes symptoms better or worse.     Past Medical History:  Diagnosis Date  . Anemia   . Anxiety   . Aortic stenosis    Moderate  . Arthritis   . Atrial fibrillation ([place])   . Chronic systolic heart failure (HCC)    LVEF 40-45%  . CKD (chronic kidney disease) stage 3, GFR 30-59 ml/min (HCC)   . Colon cancer ([place])   . Colon polyps 12/2005   Per colonoscopy  . COPD (chronic obstructive pulmonary disease) ([place])   . Coronary atherosclerosis of native coronary artery    Multivessel status post CABG  . Emphysema   . Gastric erosions 12/2005   Per EGD - Dr. [name]  . GERD (gastroesophageal reflux disease)   . Hypothyroidism   . Ischemic cardiomyopathy   . Mitral regurgitation    Moderate  . Myocardial infarction [facility])    Remote  . Psoriasis   . Pulmonary nodules     Patient Active Problem List   Diagnosis Date Noted  . CHF exacerbation ([place]) 11/06/2017  . Cellulitis of left thigh 11/06/2017  . Leukopenia 02/11/2017  . COPD mixed type ([place]) 08/11/2015  . Atrial fibrillation with rapid ventricular response ([place])   . Thrombocytopenia ([place]) 08/10/2015  . Chronic atrial fibrillation 08/08/2015  . Bronchospasm 10/05/2014  . Acute on chronic combined systolic and diastolic congestive heart failure ([place]) 10/03/2014  . Encounter for therapeutic drug monitoring 12/27/2013    . History of colon cancer   . Colon cancer ([place]) 11/21/2013  . Coronary atherosclerosis of native coronary artery 06/15/2013  . Aortic stenosis 06/15/2013  . Mitral regurgitation 06/15/2013  . CKD (chronic kidney disease) stage 3, GFR 30-59 ml/min (HCC) 06/15/2013  . Long term current use of anticoagulant therapy 12/20/2012  . Systolic CHF, chronic ([place]) 02/06/2012    Past Surgical History:  Procedure Laterality Date  . COLECTOMY N/A 12/13/2013   Procedure: TOTAL COLECTOMY;  Surgeon: [name], MD;  Location: [facility] ORS;  Service: General;  Laterality: N/A;  . CORONARY ARTERY BYPASS GRAFT  March 2004   LIMA to LAD, SVG to diagonal, SVG to OM 2, SVG to RCA  . FLEXIBLE SIGMOIDOSCOPY  10/25/2012   Procedure: FLEXIBLE SIGMOIDOSCOPY;  Surgeon: [name], MD;  Location: [facility] ENDO SUITE;  Service: Gastroenterology;  Laterality: N/A;  . PARTIAL COLECTOMY  10/26/2012   Procedure: PARTIAL COLECTOMY;  Surgeon: [name], MD;  Location: [facility] ORS;  Service: General;  Laterality: N/A;  Closure of Colovesical Fistula        Home Medications    Prior to Admission medications   Medication Sig Start Date End Date Taking? Authorizing Provider  albuterol (PROVENTIL) (2.5 MG/3ML) 0.083% nebulizer solution Inhale 3 mLs into the lungs every 4 (four) hours as needed for wheezing or shortness of breath.  08/06/15  Yes [provider]  carvedilol (COREG)  6.25 MG tablet TAKE (1) TABLET BY MOUTH TWICE DAILY WTIH A MEAL. Patient taking differently: Take 6.25 mg by mouth 2 (two) times daily with a meal. TAKE (1) TABLET BY MOUTH TWICE DAILY WTIH A MEAL. 03/16/18  Yes [name], MD  diltiazem (CARDIZEM CD) 120 MG 24 hr capsule TAKE (1) CAPSULE BY MOUTH TWICE DAILY. Patient taking differently: Take 120 mg by mouth 2 (two) times daily.  09/14/17  Yes [name], MD  furosemide (LASIX) 20 MG tablet Take 1 tablet (20 mg total) by mouth daily. 08/04/18  Yes [name], MD   Guaifenesin [facility] MAXIMUM STRENGTH) 1200 MG TB12 Take 1 tablet by mouth 2 (two) times daily.    Yes [provider]  HYDROcodone-acetaminophen (NORCO) 5-325 MG per tablet Take 1-2 tablets by mouth every 4 (four) hours as needed. Patient taking differently: Take 1 tablet by mouth every 6 (six) hours as needed for moderate pain.  12/18/13  Yes Aviva Signs, MD  Ipratropium-Albuterol (COMBIVENT RESPIMAT) 20-100 MCG/ACT AERS respimat Inhale 1 puff into the lungs as needed. Patient taking differently: Inhale 1 puff into the lungs 3 (three) times daily as needed for wheezing or shortness of breath.  06/15/13  Yes [name], MD  loperamide (IMODIUM) 2 MG capsule Take 1 capsule (2 mg total) by mouth as needed for diarrhea or loose stools. Patient taking differently: Take 4 mg by mouth daily as needed for diarrhea or loose stools.  10/29/12  Yes Chelsea Primus, MD  potassium chloride SA (K-DUR,KLOR-CON) 20 MEQ tablet Take 0.5 tablets (10 mEq total) by mouth daily. Patient taking differently: Take 20 mEq by mouth 2 (two) times daily.  02/16/17  Yes Lendon Colonel, NP  SSD 1 % cream Apply 1 application topically 3 (three) times daily as needed. Applied to legs as needed 07/14/18  Yes [provider]  vitamin B-12 (CYANOCOBALAMIN) 1000 MCG tablet Take 1 tablet (1,000 mcg total) by mouth daily. 03/16/17  Yes Twana First, MD  warfarin (COUMADIN) 5 MG tablet TAKE 1/2 TABLET BY MOUTH DAILY, EXCEPT TAKE 1 TABLET ON MONDAYS, [place]. Patient taking differently: Take 2.5-5 mg by mouth See admin instructions. TAKE 1/2 TABLET (2.5mg  total) BY MOUTH DAILY, EXCEPT TAKE 1 TABLET (5mg  total) ON MONDAYS, WEDNESDAYS AND FRIDAYS. 07/13/18  Yes [name], MD  diltiazem (CARDIZEM CD) 120 MG 24 hr capsule TAKE (1) CAPSULE BY MOUTH TWICE DAILY. Patient not taking: Reported on 08/07/2018 06/14/18   [name], MD    Family History Family History  Problem Relation Age of  Onset  . Cancer Brother   . Heart Problems Mother        unknown  . Hypertension Other     Social History Social History   Tobacco Use  . Smoking status: Former Smoker    Packs/day: 1.00    Years: 30.00    Pack years: 30.00    Types: Cigarettes    Last attempt to quit: 03/17/2003    Years since quitting: 15.4  . Smokeless tobacco: Never Used  Substance Use Topics  . Alcohol use: No    Alcohol/week: 0.0 standard drinks    Comment: no alcohol use since '04  . Drug use: No     Allergies   Patient has no known allergies.   Review of Systems Review of Systems  All other systems reviewed and are negative.    Physical Exam Updated Vital Signs BP (!) 163/71 (BP Location: Left Arm)  Pulse 70   Temp 97.7 F (36.5 C) (Oral)   Resp 20   Ht 5\' 10"  (1.778 m)   Wt 72.6 kg   SpO2 97%   BMI 22.96 kg/m   Physical Exam  Constitutional: He is oriented to person, place, and time. He appears well-developed and well-nourished.  HENT:  Head: Normocephalic and atraumatic.  Eyes: Conjunctivae are normal.  Neck: Neck supple.  Cardiovascular: Normal rate and regular rhythm.  Pulmonary/Chest: Effort normal and breath sounds normal.  No rales appreciated  Abdominal: Soft. Bowel sounds are normal.  Musculoskeletal: Normal range of motion.  Neurological: He is alert and oriented to person, place, and time.  Skin: Skin is warm and dry.  2+ peripheral edema.  Psychiatric: He has a normal mood and affect. His behavior is normal.  Nursing note and vitals reviewed.    ED Treatments / Results  Labs (all labs ordered are listed, but only abnormal results are displayed) Labs Reviewed  CBC WITH DIFFERENTIAL/PLATELET - Abnormal; Notable for the following components:      Result Value   Platelets 64 (*)    All other components within normal limits  COMPREHENSIVE METABOLIC PANEL - Abnormal; Notable for the following components:   Glucose, Bld 107 (*)    Creatinine, Ser 1.32 (*)     Total Protein 8.2 (*)    AST 14 (*)    Total Bilirubin 1.7 (*)    GFR calc non Af Amer 50 (*)    GFR calc Af Amer 58 (*)    All other components within normal limits  BRAIN NATRIURETIC PEPTIDE - Abnormal; Notable for the following components:   B Natriuretic Peptide 459.0 (*)    All other components within normal limits  TROPONIN I    EKG EKG Interpretation  Date/Time:  Sunday August 07 2018 12:57:26 EDT Ventricular Rate:  72 PR Interval:  112 QRS Duration: 124 QT Interval:  394 QTC Calculation: 431 R Axis:   -8 Text Interpretation:  Normal sinus rhythm Left bundle branch block Nonspecific ST and T wave abnormality Abnormal ECG Confirmed by Fredia Sorrow [phone]) on 08/07/2018 1:01:32 PM   Radiology Dg Chest 2 View  Result Date: 08/07/2018 CLINICAL DATA:  Pt reports increased SOB and weakness for one week. Also complaining of chronic lower back pain. EXAM: CHEST - 2 VIEW COMPARISON:  11/06/2017 FINDINGS: Stable changes from prior CABG surgery. Cardiac silhouette is normal in size. No mediastinal or hilar masses. No evidence of adenopathy. Lungs are hyperexpanded. Mild interstitial thickening. No evidence of pneumonia or pulmonary edema. No pleural effusion or pneumothorax. Skeletal structures are intact. IMPRESSION: 1. No acute cardiopulmonary disease. 2. Stable changes from CABG surgery. Mild chronic interstitial thickening with lung hyperexpansion consistent with COPD. Electronically Signed   By: Lajean Manes M.D.   On: 08/07/2018 13:32    Procedures Procedures (including critical care time)  Medications Ordered in ED Medications - No data to display   Initial Impression / Assessment and Plan / ED Course  I have reviewed the triage vital signs and the nursing notes.  Pertinent labs & imaging results that were available during my care of the patient were reviewed by me and considered in my medical decision making (see chart for details).     Patient presents with  concern about peripheral edema and questionable dyspnea.  He is in no acute distress.  Chest x-ray negative for pulmonary edema.  BNP minimally elevated.  Troponin negative.  He will increase his Lasix as  needed for this condition.  He has cardiology follow-up.  Final Clinical Impressions(s) / ED Diagnoses   Final diagnoses:  Peripheral edema    ED Discharge Orders    None       Nat Christen, MD 08/07/18 1746

## 2018-08-07 NOTE — Discharge Instructions (Addendum)
Tests were good.  You can take an extra fluid pill as needed for the swelling in the ankles.  Follow-up with your primary care doctor.

## 2018-08-07 NOTE — ED Triage Notes (Signed)
Pt reports increased SOB and weakness for one week. Also complaining of chronic lower back pain.

## 2018-08-15 ENCOUNTER — Other Ambulatory Visit (HOSPITAL_COMMUNITY): Payer: Medicare Other

## 2018-08-17 ENCOUNTER — Inpatient Hospital Stay (HOSPITAL_COMMUNITY): Payer: Medicare Other | Attending: Internal Medicine

## 2018-08-17 DIAGNOSIS — D696 Thrombocytopenia, unspecified: Secondary | ICD-10-CM | POA: Insufficient documentation

## 2018-08-17 DIAGNOSIS — D472 Monoclonal gammopathy: Secondary | ICD-10-CM | POA: Insufficient documentation

## 2018-08-17 LAB — CBC WITH DIFFERENTIAL/PLATELET
ABS IMMATURE GRANULOCYTES: 0.03 10*3/uL (ref 0.00–0.07)
BASOS PCT: 0 %
Basophils Absolute: 0 10*3/uL (ref 0.0–0.1)
Eosinophils Absolute: 0.1 10*3/uL (ref 0.0–0.5)
Eosinophils Relative: 2 %
HCT: 41.3 % (ref 39.0–52.0)
Hemoglobin: 13 g/dL (ref 13.0–17.0)
Immature Granulocytes: 1 %
Lymphocytes Relative: 17 %
Lymphs Abs: 1.1 10*3/uL (ref 0.7–4.0)
MCH: 31 pg (ref 26.0–34.0)
MCHC: 31.5 g/dL (ref 30.0–36.0)
MCV: 98.6 fL (ref 80.0–100.0)
MONO ABS: 0.6 10*3/uL (ref 0.1–1.0)
MONOS PCT: 9 %
NEUTROS ABS: 4.6 10*3/uL (ref 1.7–7.7)
Neutrophils Relative %: 71 %
PLATELETS: 76 10*3/uL — AB (ref 150–400)
RBC: 4.19 MIL/uL — ABNORMAL LOW (ref 4.22–5.81)
RDW: 14.7 % (ref 11.5–15.5)
WBC: 6.5 10*3/uL (ref 4.0–10.5)
nRBC: 0 % (ref 0.0–0.2)

## 2018-08-17 LAB — COMPREHENSIVE METABOLIC PANEL
ALBUMIN: 4.5 g/dL (ref 3.5–5.0)
ALT: 11 U/L (ref 0–44)
AST: 12 U/L — AB (ref 15–41)
Alkaline Phosphatase: 128 U/L — ABNORMAL HIGH (ref 38–126)
Anion gap: 8 (ref 5–15)
BUN: 22 mg/dL (ref 8–23)
CO2: 31 mmol/L (ref 22–32)
CREATININE: 1.37 mg/dL — AB (ref 0.61–1.24)
Calcium: 9.1 mg/dL (ref 8.9–10.3)
Chloride: 100 mmol/L (ref 98–111)
GFR calc Af Amer: 55 mL/min — ABNORMAL LOW (ref >60)
GFR, EST NON AFRICAN AMERICAN: 47 mL/min — AB (ref >60)
GLUCOSE: 82 mg/dL (ref 70–99)
POTASSIUM: 3.5 mmol/L (ref 3.5–5.1)
SODIUM: 139 mmol/L (ref 135–145)
Total Bilirubin: 1.3 mg/dL — ABNORMAL HIGH (ref 0.3–1.2)
Total Protein: 7.8 g/dL (ref 6.5–8.1)

## 2018-08-17 LAB — VITAMIN B12: Vitamin B-12: 616 pg/mL (ref 180–914)

## 2018-08-17 LAB — LACTATE DEHYDROGENASE: LDH: 156 U/L (ref 98–192)

## 2018-08-18 LAB — PROTEIN ELECTROPHORESIS, SERUM
A/G RATIO SPE: 1.4 (ref 0.7–1.7)
ALBUMIN ELP: 4.1 g/dL (ref 2.9–4.4)
ALPHA-1-GLOBULIN: 0.3 g/dL (ref 0.0–0.4)
ALPHA-2-GLOBULIN: 0.5 g/dL (ref 0.4–1.0)
BETA GLOBULIN: 1 g/dL (ref 0.7–1.3)
GAMMA GLOBULIN: 1.2 g/dL (ref 0.4–1.8)
Globulin, Total: 3 g/dL (ref 2.2–3.9)
Total Protein ELP: 7.1 g/dL (ref 6.0–8.5)

## 2018-08-18 LAB — KAPPA/LAMBDA LIGHT CHAINS
KAPPA, LAMDA LIGHT CHAIN RATIO: 1.09 (ref 0.26–1.65)
Kappa free light chain: 25.8 mg/L — ABNORMAL HIGH (ref 3.3–19.4)
Lambda free light chains: 23.6 mg/L (ref 5.7–26.3)

## 2018-08-18 LAB — IGG, IGA, IGM
IGA: 283 mg/dL (ref 61–437)
IGG (IMMUNOGLOBIN G), SERUM: 1146 mg/dL (ref 700–1600)
IgM (Immunoglobulin M), Srm: 77 mg/dL (ref 15–143)

## 2018-08-19 MED ORDER — HEPARIN SOD (PORK) LOCK FLUSH 100 UNIT/ML IV SOLN
INTRAVENOUS | Status: AC
  Filled 2018-08-19: qty 25

## 2018-08-22 ENCOUNTER — Inpatient Hospital Stay (HOSPITAL_COMMUNITY): Payer: Medicare Other | Attending: Hematology | Admitting: Internal Medicine

## 2018-08-22 ENCOUNTER — Encounter (HOSPITAL_COMMUNITY): Payer: Self-pay | Admitting: Internal Medicine

## 2018-08-22 ENCOUNTER — Other Ambulatory Visit: Payer: Self-pay

## 2018-08-22 VITALS — BP 129/58 | HR 69 | Temp 97.4°F | Resp 14 | Wt 156.2 lb

## 2018-08-22 DIAGNOSIS — D472 Monoclonal gammopathy: Secondary | ICD-10-CM | POA: Diagnosis not present

## 2018-08-22 DIAGNOSIS — Z87891 Personal history of nicotine dependence: Secondary | ICD-10-CM

## 2018-08-22 DIAGNOSIS — E538 Deficiency of other specified B group vitamins: Secondary | ICD-10-CM | POA: Insufficient documentation

## 2018-08-22 DIAGNOSIS — D693 Immune thrombocytopenic purpura: Secondary | ICD-10-CM

## 2018-08-22 DIAGNOSIS — D696 Thrombocytopenia, unspecified: Secondary | ICD-10-CM | POA: Diagnosis not present

## 2018-08-22 NOTE — Patient Instructions (Signed)
You were seen today by Dr. Mathis Dad Higgs

## 2018-08-22 NOTE — Progress Notes (Signed)
Diagnosis Chronic ITP (idiopathic thrombocytopenia) (HCC) - Plan: CBC with Differential/Platelet, Comprehensive metabolic panel, Lactate dehydrogenase  Staging Cancer Staging No matching staging information was found for the patient.  Assessment and Plan:  1.  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  Labs done 08/17/2018 reviewed and showed WBC 6.5 HB 13 plts 76,000.  Chemistries WNL with K+ 3.5 Cr 1.37 and normal LFTs.  I discussed with pt and family member he had prior scan done in 2013 that showed evidence of cirrhosis.  He is referred to GI for evaluation of liver as cirrhosis may also play a role in thrombocytopenia.  Plt count has remained > 50K.  Pt had bone marrow bx done 03/02/2017 that showed abundant megakaryocytes. Pt advised to avoid NSAIDS and ASA.  He will have repeat labs in 02/2019.    2.  B12 deficiency.  Continue oral B12 supplement at this time. Hb 13.  B12 level WNL.    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  Pt had 6% plasma cells on bone marrow biopsy done 03/02/2017.   SPEP done 08/17/2018 again showed no evidence of monoclonal protein.   SPEP done 02/14/2018 negative for monoclonal paraprotein. Kappa/lambda light chain ratio 1.09.   Will repeat labs on RTC.    INTERVAL HISTORY:  77 yr old male previously followed by Dr. 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  Current Status:  Pt is seen today for follow-up and is here to go over labs.    1.  PATHOLOGY: BM biopsy done 03/02/2017:   FINAL DIAGNOSIS Diagnosis Bone Marrow, Aspirate,Biopsy, and Clot, right iliac - SLIGHTLY HYPERCELLULAR BONE MARROW FOR AGE WITH TRILINEAGE HEMATOPOIESIS. - SLIGHT PLASMACYTOSIS (PLASMA CELLS 6%). - SEE COMMENT. PERIPHERAL BLOOD: - THROMBOCYTOPENIA.  Diagnosis Note The bone marrow is slightly hypercellular for age with trilineage hematopoiesis including abundant  megakaryocytes displaying normal morphology. In the presence of thrombocytopenia, the bone marrow findings likely represent secondary changes due to peripheral platelet destruction, sequestration or consumption. In this background, there is slight plasmacytosis present (plasma cells 6%). Immunohistochemical stains show that the plasma cells display kappa light chain excess. The findings are limited but most suggestive of early involvement by plasma cell dyscrasia/neoplasm. Correlation with cytogenetic studies, immunofixation  Problem List Patient Active Problem List   Diagnosis Date Noted  . CHF exacerbation ([place]) [I50.9] 11/06/2017  . Cellulitis of left thigh [D53.299] 11/06/2017  . Leukopenia [D72.819] 02/11/2017  . 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 Atrial fibrillation with rapid ventricular response ([place]) [I48.91]   . Thrombocytopenia ([place]) [D69.6] 08/10/2015  . Chronic atrial fibrillation [I48.20] 08/08/2015  . Bronchospasm [J98.01] 10/05/2014  . Acute on chronic combined systolic and diastolic congestive heart failure ([place]) [I50.43] 10/03/2014  . Encounter for therapeutic drug monitoring [Z51.81] 12/27/2013  . History of colon cancer [Z85.038]   . Colon cancer ([place]) [C18.9] 11/21/2013  . Coronary atherosclerosis of native coronary artery [I25.10] 06/15/2013  . Aortic stenosis [I35.0] 06/15/2013  . Mitral regurgitation [I34.0] 06/15/2013  . CKD (chronic kidney disease) stage 3, GFR 30-59 ml/min (HCC) [N18.3] 06/15/2013  . Long term current use of anticoagulant therapy [Z79.01] 12/20/2012  . Systolic CHF, chronic ([place]) [I50.22] 02/06/2012    Past Medical History Past Medical History:  Diagnosis Date  . Anemia   . Anxiety   . Aortic stenosis    Moderate  . Arthritis   .  Atrial fibrillation ([place])   . Chronic systolic heart failure (HCC)    LVEF 40-45%  . CKD (chronic kidney disease) stage 3, GFR 30-59 ml/min (HCC)   . Colon cancer ([place])   . Colon polyps 12/2005    Per colonoscopy  . COPD (chronic obstructive pulmonary disease) ([place])   . Coronary atherosclerosis of native coronary artery    Multivessel status post CABG  . Emphysema   . Gastric erosions 12/2005   Per EGD - Dr. [name]  . GERD (gastroesophageal reflux disease)   . Hypothyroidism   . Ischemic cardiomyopathy   . Mitral regurgitation    Moderate  . Myocardial infarction [facility])    Remote  . Psoriasis   . Pulmonary nodules     Past Surgical History Past Surgical History:  Procedure Laterality Date  . COLECTOMY N/A 12/13/2013   Procedure: TOTAL COLECTOMY;  Surgeon: [name], MD;  Location: [facility] ORS;  Service: General;  Laterality: N/A;  . CORONARY ARTERY BYPASS GRAFT  March 2004   LIMA to LAD, SVG to diagonal, SVG to OM 2, SVG to RCA  . FLEXIBLE SIGMOIDOSCOPY  10/25/2012   Procedure: FLEXIBLE SIGMOIDOSCOPY;  Surgeon: [name], MD;  Location: [facility] ENDO SUITE;  Service: Gastroenterology;  Laterality: N/A;  . PARTIAL COLECTOMY  10/26/2012   Procedure: PARTIAL COLECTOMY;  Surgeon: [name], MD;  Location: [facility] ORS;  Service: General;  Laterality: N/A;  Closure of Colovesical Fistula    Family History Family History  Problem Relation Age of Onset  . Cancer Brother   . Heart Problems Mother        unknown  . Hypertension Other      Social History  reports that he quit smoking about 15 years ago. His smoking use included cigarettes. He has a 30.00 pack-year smoking history. He has never used smokeless tobacco. He reports that he does not drink alcohol or use drugs.  Medications  Current Outpatient Medications:  .  albuterol (PROVENTIL) (2.5 MG/3ML) 0.083% nebulizer solution, Inhale 3 mLs into the lungs every 4 (four) hours as needed for wheezing or shortness of breath. , Disp: , Rfl:  .  carvedilol (COREG) 6.25 MG tablet, TAKE (1) TABLET BY MOUTH TWICE DAILY WTIH A MEAL. (Patient taking differently: Take 6.25 mg by mouth 2 (two) times daily with a meal. TAKE (1) TABLET BY  MOUTH TWICE DAILY WTIH A MEAL.), Disp: 60 tablet, Rfl: 6 .  diltiazem (CARDIZEM CD) 120 MG 24 hr capsule, TAKE (1) CAPSULE BY MOUTH TWICE DAILY. (Patient taking differently: Take 120 mg by mouth 2 (two) times daily. ), Disp: 180 capsule, Rfl: 3 .  diltiazem (CARDIZEM CD) 120 MG 24 hr capsule, TAKE (1) CAPSULE BY MOUTH TWICE DAILY. (Patient not taking: Reported on 08/07/2018), Disp: 180 capsule, Rfl: 0 .  furosemide (LASIX) 20 MG tablet, Take 1 tablet (20 mg total) by mouth daily., Disp: 30 tablet, Rfl: 1 .  Guaifenesin (MUCINEX MAXIMUM STRENGTH) 1200 MG TB12, Take 1 tablet by mouth 2 (two) times daily. , Disp: , Rfl:  .  HYDROcodone-acetaminophen (NORCO) 5-325 MG per tablet, Take 1-2 tablets by mouth every 4 (four) hours as needed. (Patient taking differently: Take 1 tablet by mouth every 6 (six) hours as needed for moderate pain. ), Disp: 40 tablet, Rfl: 0 .  Ipratropium-Albuterol (COMBIVENT RESPIMAT) 20-100 MCG/ACT AERS respimat, Inhale 1 puff into the lungs as needed. (Patient taking differently: Inhale 1 puff into the lungs 3 (three) times daily as needed for  wheezing or shortness of breath. ), Disp: 1 Inhaler, Rfl: 1 .  loperamide (IMODIUM) 2 MG capsule, Take 1 capsule (2 mg total) by mouth as needed for diarrhea or loose stools. (Patient taking differently: Take 4 mg by mouth daily as needed for diarrhea or loose stools. ), Disp: 20 capsule, Rfl: 2 .  potassium chloride SA (K-DUR,KLOR-CON) 20 MEQ tablet, Take 0.5 tablets (10 mEq total) by mouth daily. (Patient taking differently: Take 20 mEq by mouth 2 (two) times daily. ), Disp: 90 tablet, Rfl: 3 .  SSD 1 % cream, Apply 1 application topically 3 (three) times daily as needed. Applied to legs as needed, Disp: , Rfl:  .  vitamin B-12 (CYANOCOBALAMIN) 1000 MCG tablet, Take 1 tablet (1,000 mcg total) by mouth daily., Disp: 60 tablet, Rfl: 2 .  warfarin (COUMADIN) 5 MG tablet, TAKE 1/2 TABLET BY MOUTH DAILY, EXCEPT TAKE 1 TABLET ON MONDAYS, WEDNESDAYS  AND FRIDAYS. (Patient taking differently: Take 2.5-5 mg by mouth See admin instructions. TAKE 1/2 TABLET (2.'5mg'$  total) BY MOUTH DAILY, EXCEPT TAKE 1 TABLET ('5mg'$  total) ON MONDAYS, WEDNESDAYS AND FRIDAYS.), Disp: 30 tablet, Rfl: 3  Allergies Patient has no known allergies.  Review of Systems Review of Systems - Oncology ROS negative   Physical Exam  Vitals Wt Readings from Last 3 Encounters:  08/22/18 156 lb 3.2 oz (70.9 kg)  08/07/18 160 lb (72.6 kg)  02/21/18 160 lb 8 oz (72.8 kg)   Temp Readings from Last 3 Encounters:  08/22/18 (!) 97.4 F (36.3 C) (Oral)  08/07/18 97.7 F (36.5 C) (Oral)  02/21/18 97.6 F (36.4 C) (Oral)   BP Readings from Last 3 Encounters:  08/22/18 (!) 129/58  08/07/18 (!) 163/71  02/21/18 134/76   Pulse Readings from Last 3 Encounters:  08/22/18 69  08/07/18 70  02/21/18 64   Constitutional: Well-developed, well-nourished, and in no distress.   HENT: Head: Normocephalic and atraumatic.  Mouth/Throat: No oropharyngeal exudate. Mucosa moist. Eyes: Pupils are equal, round, and reactive to light. Conjunctivae are normal. No scleral icterus.  Neck: Normal range of motion. Neck supple. No JVD present.  Cardiovascular: Normal rate, regular rhythm and normal heart sounds.  Exam reveals no gallop and no friction rub.   No murmur heard. Pulmonary/Chest: Effort normal and breath sounds normal. No respiratory distress. No wheezes.No rales.  Abdominal: Soft. Bowel sounds are normal. No distension. There is no tenderness. There is no guarding.  Musculoskeletal: No edema or tenderness.  Lymphadenopathy: No cervical, axillary or supraclavicular adenopathy.  Neurological: Alert and oriented to person, place, and time. No cranial nerve deficit.  Skin: Skin is warm and dry. No rash noted. No erythema. No pallor. Minor bruises noted. Psychiatric: Affect and judgment normal.   Labs No visits with results within 3 Day(s) from this visit.  Latest known visit  with results is:  Appointment on 08/17/2018  Component Date Value Ref Range Status  . WBC 08/17/2018 6.5  4.0 - 10.5 K/uL Final  . RBC 08/17/2018 4.19* 4.22 - 5.81 MIL/uL Final  . Hemoglobin 08/17/2018 13.0  13.0 - 17.0 g/dL Final  . HCT 08/17/2018 41.3  39.0 - 52.0 % Final  . MCV 08/17/2018 98.6  80.0 - 100.0 fL Final  . MCH 08/17/2018 31.0  26.0 - 34.0 pg Final  . MCHC 08/17/2018 31.5  30.0 - 36.0 g/dL Final  . RDW 08/17/2018 14.7  11.5 - 15.5 % Final  . Platelets 08/17/2018 76* 150 - 400 K/uL Final   Comment: SPECIMEN  CHECKED FOR CLOTS Immature Platelet Fraction may be clinically indicated, consider ordering this additional test [id number] CONSISTENT WITH PREVIOUS RESULT   . nRBC 08/17/2018 0.0  0.0 - 0.2 % Final  . Neutrophils Relative % 08/17/2018 71  % Final  . Neutro Abs 08/17/2018 4.6  1.7 - 7.7 K/uL Final  . Lymphocytes Relative 08/17/2018 17  % Final  . Lymphs Abs 08/17/2018 1.1  0.7 - 4.0 K/uL Final  . Monocytes Relative 08/17/2018 9  % Final  . Monocytes Absolute 08/17/2018 0.6  0.1 - 1.0 K/uL Final  . Eosinophils Relative 08/17/2018 2  % Final  . Eosinophils Absolute 08/17/2018 0.1  0.0 - 0.5 K/uL Final  . Basophils Relative 08/17/2018 0  % Final  . Basophils Absolute 08/17/2018 0.0  0.0 - 0.1 K/uL Final  . Immature Granulocytes 08/17/2018 1  % Final  . Abs Immature Granulocytes 08/17/2018 0.03  0.00 - 0.07 K/uL Final   Performed at [facility], [address], [place], [place] [address]  . Sodium 08/17/2018 139  135 - 145 mmol/L Final  . Potassium 08/17/2018 3.5  3.5 - 5.1 mmol/L Final  . Chloride 08/17/2018 100  98 - 111 mmol/L Final  . CO2 08/17/2018 31  22 - 32 mmol/L Final  . Glucose, Bld 08/17/2018 82  70 - 99 mg/dL Final  . BUN 08/17/2018 22  8 - 23 mg/dL Final  . Creatinine, Ser 08/17/2018 1.37* 0.61 - 1.24 mg/dL Final  . Calcium 08/17/2018 9.1  8.9 - 10.3 mg/dL Final  . Total Protein 08/17/2018 7.8  6.5 - 8.1 g/dL Final  . Albumin 08/17/2018 4.5  3.5 -  5.0 g/dL Final  . AST 08/17/2018 12* 15 - 41 U/L Final  . ALT 08/17/2018 11  0 - 44 U/L Final  . Alkaline Phosphatase 08/17/2018 128* 38 - 126 U/L Final  . Total Bilirubin 08/17/2018 1.3* 0.3 - 1.2 mg/dL Final  . GFR calc non Af Amer 08/17/2018 47* >60 mL/min Final  . GFR calc Af Amer 08/17/2018 55* >60 mL/min Final   Comment: (NOTE) The eGFR has been calculated using the CKD EPI equation. This calculation has not been validated in all clinical situations. eGFR's persistently <60 mL/min signify possible Chronic Kidney Disease.   Georgiann Hahn gap 08/17/2018 8  5 - 15 Final   Performed at [facility], [address] [place], [place] [address]  . LDH 08/17/2018 156  98 - 192 U/L Final   Performed at [facility], [address], [place], [place] [address]  . Total Protein ELP 08/17/2018 7.1  6.0 - 8.5 g/dL Final  . Albumin ELP 08/17/2018 4.1  2.9 - 4.4 g/dL Final  . Alpha-1-Globulin 08/17/2018 0.3  0.0 - 0.4 g/dL Final  . Alpha-2-Globulin 08/17/2018 0.5  0.4 - 1.0 g/dL Final  . Beta Globulin 08/17/2018 1.0  0.7 - 1.3 g/dL Final  . Gamma Globulin 08/17/2018 1.2  0.4 - 1.8 g/dL Final  . M-Spike, % 08/17/2018 Not Observed  Not Observed g/dL Final  . SPE Interp. 08/17/2018 Comment   Final   Comment: (NOTE) The SPE pattern appears essentially unremarkable. Evidence of monoclonal protein is not apparent. Performed At: [facility] [place], [place] [id number] Rush Farmer MD [id number]   . Comment 08/17/2018 Comment   Final   Comment: (NOTE) Protein electrophoresis scan will follow via computer, mail, or courier delivery.   [name] GLOBULIN, TOTAL 08/17/2018 3.0  2.2 - 3.9 g/dL Corrected  . A/G Ratio 08/17/2018  1.4  0.7 - 1.7 Corrected  . Vitamin B-12 08/17/2018 616  180 - 914 pg/mL Final   Comment: (NOTE) This assay is not validated for testing neonatal or myeloproliferative syndrome specimens for Vitamin B12 levels. Performed at [facility], [address],  [place], [place] [address]   . Kappa free light chain 08/17/2018 25.8* 3.3 - 19.4 mg/L Final  . Lamda free light chains 08/17/2018 23.6  5.7 - 26.3 mg/L Final  . Kappa, lamda light chain ratio 08/17/2018 1.09  0.26 - 1.65 Final   Comment: (NOTE) Performed At: [facility] [place], [place] [id number] Rush Farmer MD [id number]   . IgG (Immunoglobin G), Serum 08/17/2018 1,146  700 - 1,600 mg/dL Final  . IgA 08/17/2018 283  61 - 437 mg/dL Final  . IgM (Immunoglobulin M), Srm 08/17/2018 77  15 - 143 mg/dL Final   Comment: (NOTE) Performed At: [facility] [place], [place] [id number] Rush Farmer MD [id number]      Pathology Orders Placed This Encounter  Procedures  . CBC with Differential/Platelet    Standing Status:   Future    Standing Expiration Date:   08/22/2020  . Comprehensive metabolic panel    Standing Status:   Future    Standing Expiration Date:   08/22/2020  . Lactate dehydrogenase    Standing Status:   Future    Standing Expiration Date:   08/22/2020       Zoila Shutter MD

## 2018-08-25 ENCOUNTER — Telehealth: Payer: Self-pay | Admitting: Cardiology

## 2018-08-25 ENCOUNTER — Other Ambulatory Visit: Payer: Self-pay | Admitting: Cardiology

## 2018-08-25 MED ORDER — FUROSEMIDE 20 MG PO TABS: 20.0000 mg | ORAL_TABLET | Freq: Every day | ORAL | 1 refills | Status: DC

## 2018-08-25 NOTE — Telephone Encounter (Signed)
Pt is needing some furosemide (LASIX) 20 MG tablet [[id number]] sent in to the pharmacy because he's taking more than what the Rx is written for.

## 2018-08-25 NOTE — Telephone Encounter (Signed)
Wants to know if he can have coumadin check on same day as apt w/ SM

## 2018-08-25 NOTE — Telephone Encounter (Signed)
Spoke with pt. He states that he takes an extra 1/2 tablet some days after he has stood up for a long period of time. He has an appointment to see [name] on 11/21/219 where I advised him to make sure he discusses this at that appointment. He voiced understanding. Sent RX to [facility].

## 2018-08-25 NOTE — Telephone Encounter (Signed)
Cory Ryan, Please add him on to the nurses schedule when he sees Heeia on 11/21.  I will let pt know. Thanks, Lattie Haw

## 2018-08-29 ENCOUNTER — Encounter: Payer: Self-pay | Admitting: Internal Medicine

## 2018-08-31 NOTE — Progress Notes (Signed)
Cardiology Office Note  Date: 09/01/2018   ID: Cory Ryan, Cory Ryan January 05, 1941, MRN [id number]  PCP: Cory Sites, MD  Primary Cardiologist: Cory Lesches, MD   Chief Complaint  Patient presents with  . Cardiac follow-up    History of Present Illness: Cory Ryan is a 78 y.o. male last seen in November 2018.  He presents today with his son overdue for follow-up.  He does not report any recent chest pain or palpitations.  He has had fluctuating lower extremity edema and chronic venous stasis.  He is also been running out of his Lasix however, uses an extra half dose at least a few times a week.  His weight is relatively stable.  He remains on Coumadin with follow-up in the anticoagulation clinic.  Due for follow-up INR today which will be checked.  He does not report any bleeding episodes.  Follow-up echocardiogram in January of this year revealed LVEF 45 to 50% range, possible bicuspid aortic valve with mild aortic stenosis, moderate biatrial enlargement, PASP 44 mg mercury.  I reviewed his cardiac medications.  Cardiac regimen includes Coreg, Cardizem CD, Lasix with potassium supplements, and Coumadin.  Past Medical History:  Diagnosis Date  . Anemia   . Anxiety   . Aortic stenosis    Moderate  . Arthritis   . Atrial fibrillation ([place])   . Chronic systolic heart failure (HCC)    LVEF 40-45%  . CKD (chronic kidney disease) stage 3, GFR 30-59 ml/min (HCC)   . Colon cancer ([place])   . Colon polyps 12/2005   Per colonoscopy  . COPD (chronic obstructive pulmonary disease) ([place])   . Coronary atherosclerosis of native coronary artery    Multivessel status post CABG  . Emphysema   . Gastric erosions 12/2005   Per EGD - Dr. Gala Ryan  . GERD (gastroesophageal reflux disease)   . Hypothyroidism   . Ischemic cardiomyopathy   . Mitral regurgitation    Moderate  . Myocardial infarction [facility])    Remote  . Psoriasis   . Pulmonary nodules     Past Surgical History:  Procedure  Laterality Date  . COLECTOMY N/A 12/13/2013   Procedure: TOTAL COLECTOMY;  Surgeon: Cory So, MD;  Location: [facility] ORS;  Service: General;  Laterality: N/A;  . CORONARY ARTERY BYPASS GRAFT  March 2004   LIMA to LAD, SVG to diagonal, SVG to OM 2, SVG to RCA  . FLEXIBLE SIGMOIDOSCOPY  10/25/2012   Procedure: FLEXIBLE SIGMOIDOSCOPY;  Surgeon: Cory So, MD;  Location: [facility] ENDO SUITE;  Service: Gastroenterology;  Laterality: N/A;  . PARTIAL COLECTOMY  10/26/2012   Procedure: PARTIAL COLECTOMY;  Surgeon: Cory So, MD;  Location: [facility] ORS;  Service: General;  Laterality: N/A;  Closure of Colovesical Fistula    Current Outpatient Medications  Medication Sig Dispense Refill  . albuterol (PROVENTIL) (2.5 MG/3ML) 0.083% nebulizer solution Inhale 3 mLs into the lungs every 4 (four) hours as needed for wheezing or shortness of breath.     . carvedilol (COREG) 6.25 MG tablet TAKE (1) TABLET BY MOUTH TWICE DAILY WTIH A MEAL. (Patient taking differently: Take 6.25 mg by mouth 2 (two) times daily with a meal. TAKE (1) TABLET BY MOUTH TWICE DAILY WTIH A MEAL.) 60 tablet 6  . diltiazem (CARDIZEM CD) 120 MG 24 hr capsule TAKE (1) CAPSULE BY MOUTH TWICE DAILY. (Patient taking differently: Take 120 mg by mouth 2 (two) times daily. ) 180 capsule 3  . diltiazem (CARDIZEM CD)  120 MG 24 hr capsule TAKE (1) CAPSULE BY MOUTH TWICE DAILY. 180 capsule 0  . furosemide (LASIX) 20 MG tablet TAKE 20 MG EVERY OTHER DAY ALTERNATING WITH 30 MG EVERY OTHER DAY 135 tablet 1  . Guaifenesin (MUCINEX MAXIMUM STRENGTH) 1200 MG TB12 Take 1 tablet by mouth 2 (two) times daily.     [name] HYDROcodone-acetaminophen (NORCO) 5-325 MG per tablet Take 1-2 tablets by mouth every 4 (four) hours as needed. (Patient taking differently: Take 1 tablet by mouth every 6 (six) hours as needed for moderate pain. ) 40 tablet 0  . Ipratropium-Albuterol (COMBIVENT RESPIMAT) 20-100 MCG/ACT AERS respimat Inhale 1 puff into the lungs as needed. (Patient  taking differently: Inhale 1 puff into the lungs 3 (three) times daily as needed for wheezing or shortness of breath. ) 1 Inhaler 1  . loperamide (IMODIUM) 2 MG capsule Take 1 capsule (2 mg total) by mouth as needed for diarrhea or loose stools. (Patient taking differently: Take 4 mg by mouth daily as needed for diarrhea or loose stools. ) 20 capsule 2  . potassium chloride SA (K-DUR,KLOR-CON) 20 MEQ tablet Take 0.5 tablets (10 mEq total) by mouth daily. (Patient taking differently: Take 20 mEq by mouth 2 (two) times daily. ) 90 tablet 3  . SSD 1 % cream Apply 1 application topically 3 (three) times daily as needed. Applied to legs as needed    . vitamin B-12 (CYANOCOBALAMIN) 1000 MCG tablet Take 1 tablet (1,000 mcg total) by mouth daily. 60 tablet 2  . warfarin (COUMADIN) 5 MG tablet TAKE 1/2 TABLET BY MOUTH DAILY, EXCEPT TAKE 1 TABLET ON MONDAYS, WEDNESDAYS AND FRIDAYS. (Patient taking differently: Take 2.5-5 mg by mouth See admin instructions. TAKE 1/2 TABLET (2.5mg  total) BY MOUTH DAILY, EXCEPT TAKE 1 TABLET (5mg  total) ON MONDAYS, WEDNESDAYS AND FRIDAYS.) 30 tablet 3   No current facility-administered medications for this visit.    Allergies:  Patient has no known allergies.   Social History: The patient  reports that he quit smoking about 15 years ago. His smoking use included cigarettes. He has a 30.00 pack-year smoking history. He has never used smokeless tobacco. He reports that he does not drink alcohol or use drugs.   ROS:  Please see the history of present illness. Otherwise, complete review of systems is positive for hearing loss.  All other systems are reviewed and negative.   Physical Exam: VS:  BP 116/62 (BP Location: Left Arm)   Pulse 83   Ht 5\' 10"  (1.778 m)   Wt 158 lb (71.7 kg)   SpO2 91%   BMI 22.67 kg/m , BMI Body mass index is 22.67 kg/m.  Wt Readings from Last 3 Encounters:  09/01/18 158 lb (71.7 kg)  08/22/18 156 lb 3.2 oz (70.9 kg)  08/07/18 160 lb (72.6 kg)      General: Elderly male, chronically ill-appearing. HEENT: Conjunctiva and lids normal, oropharynx clear. Neck: Supple, no elevated JVP or carotid bruits, no thyromegaly. Lungs: Diminished breath sounds, nonlabored breathing at rest. Cardiac: Irregularly irregular, no S3, 2/6 systolic murmur. Abdomen: Soft, nontender, bowel sounds present. Extremities: 2+ lower leg edema with venous stasis, distal pulses 1+. Skin: Warm and dry. Musculoskeletal: No kyphosis. Neuropsychiatric: Alert and oriented x3, affect grossly appropriate.  ECG: I personally reviewed the tracing from 08/08/2018 which shows probable course atrial fibrillation versus atypical atrial flutter with IVCD and repolarization abnormalities.  Recent Labwork: 08/07/2018: B Natriuretic Peptide 459.0 08/17/2018: ALT 11; AST 12; BUN 22; Creatinine, Ser 1.37;  Hemoglobin 13.0; Platelets 76; Potassium 3.5; Sodium 139   Other Studies Reviewed Today:  Echocardiogram 11/07/2017: Study Conclusions  - Left ventricle: The cavity size was mildly dilated. There was   mild concentric hypertrophy. Systolic function was mildly   reduced. The estimated ejection fraction was in the range of 45%   to 50%. There is akinesis of the inferior myocardium. - Aortic valve: A bicuspid morphology cannot be excluded; severely   thickened, severely calcified leaflets. There was mild stenosis.   Mean gradient (S): 8 mm Hg. Valve area (VTI): 1.08 cm^2. Valve   area (Vmax): 1.02 cm^2. Valve area (Vmean): 1.14 cm^2. - Mitral valve: Calcified annulus. There was moderate   regurgitation. - Left atrium: The atrium was moderately dilated. - Right atrium: The atrium was moderately dilated. - Atrial septum: There was increased thickness of the septum,   consistent with lipomatous hypertrophy. - Pulmonary arteries: PA peak pressure: 44 mm Hg (S).  Impressions:  - The right ventricular systolic pressure was increased consistent   with moderate pulmonary  hypertension.  Assessment and Plan:  1.  Permanent atrial fibrillation.  Continue strategy of heart rate control and anticoagulation.  He continues on Coumadin with follow-up in the anticoagulation clinic.  Heart rate is well controlled today.  2.  Chronic combined heart failure, most recent LVEF had improved somewhat to the range of 45 to 50%.  He continues to have chronic leg edema with venous stasis.  Change Lasix to 20 mg alternating with 30 mg every other day, follow-up BMET in 2 weeks.  3.  Multivessel CAD status post CABG.  He does not report any active angina symptoms.  Not on aspirin given concurrent use of Coumadin.  4.  Mild aortic stenosis by echocardiogram in January.  Asymptomatic.  Current medicines were reviewed with the patient today.   Orders Placed This Encounter  Procedures  . Basic Metabolic Panel (BMET)    Disposition: Follow-up in 3 months.  Signed, [name], MD, [facility] 09/01/2018 11:21 AM    [facility]. [address] [place], [place] [address] Phone: [phone]; Fax: [phone]

## 2018-09-01 ENCOUNTER — Encounter: Payer: Self-pay | Admitting: Cardiology

## 2018-09-01 ENCOUNTER — Ambulatory Visit: Payer: Medicare Other | Admitting: Cardiology

## 2018-09-01 ENCOUNTER — Ambulatory Visit (INDEPENDENT_AMBULATORY_CARE_PROVIDER_SITE_OTHER): Payer: Medicare Other | Admitting: *Deleted

## 2018-09-01 VITALS — BP 116/62 | HR 83 | Ht 70.0 in | Wt 158.0 lb

## 2018-09-01 DIAGNOSIS — Z5181 Encounter for therapeutic drug level monitoring: Secondary | ICD-10-CM

## 2018-09-01 DIAGNOSIS — I4821 Permanent atrial fibrillation: Secondary | ICD-10-CM

## 2018-09-01 DIAGNOSIS — I2510 Atherosclerotic heart disease of native coronary artery without angina pectoris: Secondary | ICD-10-CM

## 2018-09-01 DIAGNOSIS — I5042 Chronic combined systolic (congestive) and diastolic (congestive) heart failure: Secondary | ICD-10-CM

## 2018-09-01 DIAGNOSIS — I480 Paroxysmal atrial fibrillation: Secondary | ICD-10-CM | POA: Diagnosis not present

## 2018-09-01 DIAGNOSIS — I350 Nonrheumatic aortic (valve) stenosis: Secondary | ICD-10-CM

## 2018-09-01 LAB — POCT INR: INR: 3 (ref 2.0–3.0)

## 2018-09-01 MED ORDER — FUROSEMIDE 20 MG PO TABS: ORAL_TABLET | ORAL | 1 refills | Status: DC

## 2018-09-01 NOTE — Patient Instructions (Signed)
Continue coumadin 1/2 tablet daily except 1 tablet on Mondays, Wednesdays and Fridays Recheck in 6 weeks 

## 2018-09-01 NOTE — Patient Instructions (Signed)
Your physician recommends that you schedule a follow-up appointment in: [place] has recommended you make the following change in your medication:   CHANGE LASIX 20 MG (1 TABLET) EVERY OTHER DAY ALTERNATING WITH 30 MG (1 AND 1/2 TABLET) [place]  Your physician recommends that you return for lab work in: 2 WEEKS BMP  Thank you for choosing [facility]!!

## 2018-09-05 ENCOUNTER — Other Ambulatory Visit: Payer: Self-pay

## 2018-09-05 ENCOUNTER — Emergency Department (HOSPITAL_COMMUNITY)
Admission: EM | Admit: 2018-09-05 | Discharge: 2018-09-05 | Disposition: A | Discharge status: Home / Self Care | Payer: Medicare Other | Attending: Emergency Medicine | Admitting: Emergency Medicine

## 2018-09-05 ENCOUNTER — Encounter (HOSPITAL_COMMUNITY): Payer: Self-pay | Admitting: Emergency Medicine

## 2018-09-05 DIAGNOSIS — E039 Hypothyroidism, unspecified: Secondary | ICD-10-CM | POA: Diagnosis not present

## 2018-09-05 DIAGNOSIS — S41112A Laceration without foreign body of left upper arm, initial encounter: Secondary | ICD-10-CM | POA: Diagnosis not present

## 2018-09-05 DIAGNOSIS — D696 Thrombocytopenia, unspecified: Secondary | ICD-10-CM | POA: Insufficient documentation

## 2018-09-05 DIAGNOSIS — I252 Old myocardial infarction: Secondary | ICD-10-CM | POA: Insufficient documentation

## 2018-09-05 DIAGNOSIS — Z87891 Personal history of nicotine dependence: Secondary | ICD-10-CM | POA: Diagnosis not present

## 2018-09-05 DIAGNOSIS — Y939 Activity, unspecified: Secondary | ICD-10-CM | POA: Diagnosis not present

## 2018-09-05 DIAGNOSIS — D693 Immune thrombocytopenic purpura: Secondary | ICD-10-CM

## 2018-09-05 DIAGNOSIS — Z7901 Long term (current) use of anticoagulants: Secondary | ICD-10-CM | POA: Insufficient documentation

## 2018-09-05 DIAGNOSIS — Y929 Unspecified place or not applicable: Secondary | ICD-10-CM | POA: Insufficient documentation

## 2018-09-05 DIAGNOSIS — I5022 Chronic systolic (congestive) heart failure: Secondary | ICD-10-CM | POA: Diagnosis not present

## 2018-09-05 DIAGNOSIS — Y998 Other external cause status: Secondary | ICD-10-CM | POA: Diagnosis not present

## 2018-09-05 DIAGNOSIS — J449 Chronic obstructive pulmonary disease, unspecified: Secondary | ICD-10-CM | POA: Diagnosis not present

## 2018-09-05 DIAGNOSIS — N183 Chronic kidney disease, stage 3 (moderate): Secondary | ICD-10-CM | POA: Diagnosis not present

## 2018-09-05 DIAGNOSIS — I255 Ischemic cardiomyopathy: Secondary | ICD-10-CM | POA: Insufficient documentation

## 2018-09-05 DIAGNOSIS — Z79899 Other long term (current) drug therapy: Secondary | ICD-10-CM | POA: Insufficient documentation

## 2018-09-05 DIAGNOSIS — I482 Chronic atrial fibrillation, unspecified: Secondary | ICD-10-CM | POA: Diagnosis not present

## 2018-09-05 DIAGNOSIS — Y33XXXA Other specified events, undetermined intent, initial encounter: Secondary | ICD-10-CM | POA: Insufficient documentation

## 2018-09-05 LAB — CBC WITH DIFFERENTIAL/PLATELET
Abs Immature Granulocytes: 0.01 10*3/uL (ref 0.00–0.07)
BASOS ABS: 0 10*3/uL (ref 0.0–0.1)
Basophils Relative: 0 %
EOS ABS: 0.1 10*3/uL (ref 0.0–0.5)
EOS PCT: 2 %
HEMATOCRIT: 38.4 % — AB (ref 39.0–52.0)
Hemoglobin: 12.3 g/dL — ABNORMAL LOW (ref 13.0–17.0)
Immature Granulocytes: 0 %
LYMPHS ABS: 0.8 10*3/uL (ref 0.7–4.0)
LYMPHS PCT: 16 %
MCH: 33 pg (ref 26.0–34.0)
MCHC: 32 g/dL (ref 30.0–36.0)
MCV: 102.9 fL — ABNORMAL HIGH (ref 80.0–100.0)
Monocytes Absolute: 0.3 10*3/uL (ref 0.1–1.0)
Monocytes Relative: 7 %
NEUTROS PCT: 75 %
NRBC: 0 % (ref 0.0–0.2)
Neutro Abs: 3.7 10*3/uL (ref 1.7–7.7)
Platelets: 56 10*3/uL — ABNORMAL LOW (ref 150–400)
RBC: 3.73 MIL/uL — ABNORMAL LOW (ref 4.22–5.81)
RDW: 15.9 % — AB (ref 11.5–15.5)
WBC: 5 10*3/uL (ref 4.0–10.5)

## 2018-09-05 LAB — BASIC METABOLIC PANEL
Anion gap: 8 (ref 5–15)
BUN: 20 mg/dL (ref 8–23)
CALCIUM: 8.7 mg/dL — AB (ref 8.9–10.3)
CO2: 25 mmol/L (ref 22–32)
CREATININE: 1.47 mg/dL — AB (ref 0.61–1.24)
Chloride: 105 mmol/L (ref 98–111)
GFR calc non Af Amer: 44 mL/min — ABNORMAL LOW (ref >60)
GFR, EST AFRICAN AMERICAN: 51 mL/min — AB (ref >60)
GLUCOSE: 98 mg/dL (ref 70–99)
Potassium: 3.7 mmol/L (ref 3.5–5.1)
Sodium: 138 mmol/L (ref 135–145)

## 2018-09-05 NOTE — ED Provider Notes (Signed)
[facility] EMERGENCY DEPARTMENT Provider Note   CSN: [id number] Arrival date & time: 09/05/18  1650     History   Chief Complaint Chief Complaint  Patient presents with  . Bleeding/Bruising    HPI Cory Ryan is a 77 y.o. 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  He has an old bruise with skin tears on his left upper arm which was healing but he bumped the scabs yesterday and now has bleeding that has not stopped despite pressure and dressings.  His INR completed 4 days ago was 3.0 and his platelet count average recent baseline has been in the 61-76 K/uL range.  The history is provided by the patient.    Past Medical History:  Diagnosis Date  . Anemia   . Anxiety   . Aortic stenosis    Moderate  . Arthritis   . Atrial fibrillation ([place])   . Chronic systolic heart failure (HCC)    LVEF 40-45%  . CKD (chronic kidney disease) stage 3, GFR 30-59 ml/min (HCC)   . Colon cancer ([place])   . Colon polyps 12/2005   Per colonoscopy  . COPD (chronic obstructive pulmonary disease) ([place])   . Coronary atherosclerosis of native coronary artery    Multivessel status post CABG  . Emphysema   . Gastric erosions 12/2005   Per EGD - Dr. [name]  . GERD (gastroesophageal reflux disease)   . Hypothyroidism   . Ischemic cardiomyopathy   . Mitral regurgitation    Moderate  . Myocardial infarction [facility])    Remote  . Psoriasis   . Pulmonary nodules     Patient Active Problem List   Diagnosis Date Noted  . CHF exacerbation ([place]) 11/06/2017  . Cellulitis of left thigh 11/06/2017  . Leukopenia 02/11/2017  . COPD mixed type ([place]) 08/11/2015  . Atrial fibrillation with rapid ventricular response ([place])   .  Thrombocytopenia ([place]) 08/10/2015  . Chronic atrial fibrillation 08/08/2015  . Bronchospasm 10/05/2014  . Acute on chronic combined systolic and diastolic congestive heart failure ([place]) 10/03/2014  . Encounter for therapeutic drug monitoring 12/27/2013  . History of colon cancer   . Colon cancer ([place]) 11/21/2013  . Coronary atherosclerosis of native coronary artery 06/15/2013  . Aortic stenosis 06/15/2013  . Mitral regurgitation 06/15/2013  . CKD (chronic kidney disease) stage 3, GFR 30-59 ml/min (HCC) 06/15/2013  . Long term current use of anticoagulant therapy 12/20/2012  . Systolic CHF, chronic ([place]) 02/06/2012    Past Surgical History:  Procedure Laterality Date  . COLECTOMY N/A 12/13/2013   Procedure: TOTAL COLECTOMY;  Surgeon: [name], MD;  Location: [facility] ORS;  Service: General;  Laterality: N/A;  . CORONARY ARTERY BYPASS GRAFT  March 2004   LIMA to LAD, SVG to diagonal, SVG to OM 2, SVG to RCA  . FLEXIBLE SIGMOIDOSCOPY  10/25/2012   Procedure: FLEXIBLE SIGMOIDOSCOPY;  Surgeon: [name], MD;  Location: [facility] ENDO SUITE;  Service: Gastroenterology;  Laterality: N/A;  . PARTIAL COLECTOMY  10/26/2012   Procedure: PARTIAL COLECTOMY;  Surgeon: [name], MD;  Location: [facility] ORS;  Service: General;  Laterality: N/A;  Closure of Colovesical Fistula        Home Medications    Prior to  Admission medications   Medication Sig Start Date End Date Taking? Authorizing Provider  albuterol (PROVENTIL) (2.5 MG/3ML) 0.083% nebulizer solution Inhale 3 mLs into the lungs every 4 (four) hours as needed for wheezing or shortness of breath.  08/06/15   [provider]  carvedilol (COREG) 6.25 MG tablet TAKE (1) TABLET BY MOUTH TWICE DAILY WTIH A MEAL. Patient taking differently: Take 6.25 mg by mouth 2 (two) times daily with a meal. TAKE (1) TABLET BY MOUTH TWICE DAILY WTIH A MEAL. 03/16/18   [name], MD  diltiazem (CARDIZEM CD) 120 MG 24 hr capsule TAKE (1) CAPSULE BY  MOUTH TWICE DAILY. Patient taking differently: Take 120 mg by mouth 2 (two) times daily.  09/14/17   [name], MD  diltiazem (CARDIZEM CD) 120 MG 24 hr capsule TAKE (1) CAPSULE BY MOUTH TWICE DAILY. 06/14/18   [name], MD  furosemide (LASIX) 20 MG tablet TAKE 20 MG EVERY OTHER DAY ALTERNATING WITH 30 MG EVERY OTHER DAY 09/01/18   [name], MD  Guaifenesin [facility] MAXIMUM STRENGTH) 1200 MG TB12 Take 1 tablet by mouth 2 (two) times daily.     [provider]  HYDROcodone-acetaminophen (NORCO) 5-325 MG per tablet Take 1-2 tablets by mouth every 4 (four) hours as needed. Patient taking differently: Take 1 tablet by mouth every 6 (six) hours as needed for moderate pain.  12/18/13   Aviva Signs, MD  Ipratropium-Albuterol (COMBIVENT RESPIMAT) 20-100 MCG/ACT AERS respimat Inhale 1 puff into the lungs as needed. Patient taking differently: Inhale 1 puff into the lungs 3 (three) times daily as needed for wheezing or shortness of breath.  06/15/13   [name], MD  loperamide (IMODIUM) 2 MG capsule Take 1 capsule (2 mg total) by mouth as needed for diarrhea or loose stools. Patient taking differently: Take 4 mg by mouth daily as needed for diarrhea or loose stools.  10/29/12   Chelsea Primus, MD  potassium chloride SA (K-DUR,KLOR-CON) 20 MEQ tablet Take 0.5 tablets (10 mEq total) by mouth daily. Patient taking differently: Take 20 mEq by mouth 2 (two) times daily.  02/16/17   Lendon Colonel, NP  SSD 1 % cream Apply 1 application topically 3 (three) times daily as needed. Applied to legs as needed 07/14/18   [provider]  vitamin B-12 (CYANOCOBALAMIN) 1000 MCG tablet Take 1 tablet (1,000 mcg total) by mouth daily. 03/16/17   Twana First, MD  warfarin (COUMADIN) 5 MG tablet TAKE 1/2 TABLET BY MOUTH DAILY, EXCEPT TAKE 1 TABLET ON MONDAYS, [place]. Patient taking differently: Take 2.5-5 mg by mouth See admin instructions. TAKE 1/2 TABLET (2.5mg   total) BY MOUTH DAILY, EXCEPT TAKE 1 TABLET (5mg  total) ON MONDAYS, WEDNESDAYS AND FRIDAYS. 07/13/18   [name], MD    Family History Family History  Problem Relation Age of Onset  . Cancer Brother   . Heart Problems Mother        unknown  . Hypertension Other     Social History Social History   Tobacco Use  . Smoking status: Former Smoker    Packs/day: 1.00    Years: 30.00    Pack years: 30.00    Types: Cigarettes    Last attempt to quit: 03/17/2003    Years since quitting: 15.4  . Smokeless tobacco: Never Used  Substance Use Topics  . Alcohol use: No    Alcohol/week: 0.0 standard drinks    Comment: no alcohol use since '04  .  Drug use: No     Allergies   Patient has no known allergies.   Review of Systems Review of Systems  Constitutional: Negative for chills, fatigue and fever.  Respiratory: Negative.   Cardiovascular: Negative.   Skin: Positive for wound.  Neurological: Negative for dizziness and numbness.     Physical Exam Updated Vital Signs BP (!) 136/58 (BP Location: Right Arm)   Pulse 72   Temp 98.6 F (37 C) (Oral)   Resp 16   Ht 5\' 9"  (1.753 m)   Wt 72.6 kg   SpO2 96%   BMI 23.63 kg/m   Physical Exam  Constitutional: He is oriented to person, place, and time. He appears well-developed and well-nourished.  HENT:  Head: Normocephalic.  Cardiovascular: Normal rate.  Pulmonary/Chest: Effort normal.  Neurological: He is alert and oriented to person, place, and time. No sensory deficit.  Skin: Abrasion and bruising noted.  Moderately large area of old appearing bruise left upper lateral upper arm.  2 small abraded areas along the periphery of this bruise, and 2 larger abrasions, the greatest measuring 3x2 cm , all with a slow oozing bleed.  No areas of pinpoint bleeding. No edema, no erythema, no purulet draiange or signs of infection.     ED Treatments / Results  Labs (all labs ordered are listed, but only abnormal results are  displayed) Labs Reviewed  CBC WITH DIFFERENTIAL/PLATELET - Abnormal; Notable for the following components:      Result Value   RBC 3.73 (*)    Hemoglobin 12.3 (*)    HCT 38.4 (*)    MCV 102.9 (*)    RDW 15.9 (*)    Platelets 56 (*)    All other components within normal limits  BASIC METABOLIC PANEL - Abnormal; Notable for the following components:   Creatinine, Ser 1.47 (*)    Calcium 8.7 (*)    GFR calc non Af Amer 44 (*)    GFR calc Af Amer 51 (*)    All other components within normal limits    EKG None  Radiology No results found.  Procedures Procedures (including critical care time)  Complicated repair. Time spent 40 minutes obtaining hemostasis of these abrasions.  LACERATION REPAIR Performed by: Evalee Jefferson Authorized by: Evalee Jefferson Consent: Verbal consent obtained. Risks and benefits: risks, benefits and alternatives were discussed Consent given by: patient Patient identity confirmed: provided demographic data Prepped and Draped in normal sterile fashion Wound explored  Laceration Location: left upper arm  Laceration Length: 3x2 cm, 2 x 0.5 cm, and 2 additional abrasions measuring less than 0.5 cm each.  No Foreign Bodies seen or palpated  Anesthesia: n/a  Local anesthetic:n/a  Anesthetic total: n/a  Irrigation method: syringe Amount of cleaning: standard  Skin closure: attempt to control bleeding using pressure and Quickclot cloth was not effective for these site.    Next attempted use of Wound Seal powder.  This product did control the bleeding, after multiple layers and direct pressure applied to each lesion.    Number of sutures: na  Technique: Wound Seal powder.  Patient tolerance: Patient tolerated the procedure well with no immediate complications.   Medications Ordered in ED Medications - No data to display   Initial Impression / Assessment and Plan / ED Course  I have reviewed the triage vital signs and the nursing  notes.  Pertinent labs & imaging results that were available during my care of the patient were reviewed by me and considered in  my medical decision making (see chart for details).     Thick cling and kerlex dressing applied over the wound sites. Pt advised to keep covered for 24 hours, then can unwrap and inspect daily for any changes/drainage, bleeding.  Advised to allow scabs to fall off, do not bump, rub or pick. Pt understands.  Labs reviewed, platelet count stable low, greater than 50,000.  Prn f/u anticipated.  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, DO 09/08/18 1854

## 2018-09-05 NOTE — Discharge Instructions (Addendum)
As discussed, the wound care product that we used to stop your bleeding acts just like a scab.  Avoid picking at the sites, and as discussed I would recommend keeping this arm dry for at least the next 1 to 2 days.  The sites should eventually fall off just like a regular scab would do once the skin underneath has healed.  Keep the areas covered also to avoid bumping or potentially disrupting these scabs.

## 2018-09-05 NOTE — ED Triage Notes (Signed)
Pt reports bleeding to LT arm x 2 days. Denies injury. Pt states they were sent over by PCP. Bleeding controlled at this time. Pt is on blood thinners.

## 2018-09-14 ENCOUNTER — Telehealth: Payer: Self-pay | Admitting: Cardiology

## 2018-09-14 MED ORDER — FUROSEMIDE 20 MG PO TABS: ORAL_TABLET | ORAL | 1 refills | Status: DC

## 2018-09-14 MED ORDER — DILTIAZEM HCL ER COATED BEADS 120 MG PO CP24: ORAL_CAPSULE | ORAL | 3 refills | Status: AC

## 2018-09-14 MED ORDER — FUROSEMIDE 20 MG PO TABS: ORAL_TABLET | ORAL | 1 refills | Status: AC

## 2018-09-14 MED ORDER — WARFARIN SODIUM 5 MG PO TABS: 2.5000 mg | ORAL_TABLET | ORAL | 3 refills | Status: AC

## 2018-09-14 NOTE — Telephone Encounter (Signed)
Refills sent

## 2018-09-14 NOTE — Telephone Encounter (Signed)
[facility] is needing a new Rx sent in for the pt's furosemide (LASIX) 20 MG tablet [[id number]] , he's running out before it's time for them to be refilled   He's also needing refills on- warfarin (COUMADIN) 5 MG tablet [[id number]]  And  diltiazem (CARDIZEM CD) 120 MG 24 hr capsule [[id number]]   His son states he's been by the pharmacy and they told him to come to the office and let us know he needed refills.

## 2018-09-15 ENCOUNTER — Other Ambulatory Visit (HOSPITAL_COMMUNITY)
Admission: RE | Admit: 2018-09-15 | Discharge: 2018-09-15 | Disposition: A | Discharge status: Home / Self Care | Payer: Medicare Other | Source: Ambulatory Visit | Attending: Cardiology | Admitting: Cardiology

## 2018-09-15 DIAGNOSIS — I350 Nonrheumatic aortic (valve) stenosis: Secondary | ICD-10-CM | POA: Insufficient documentation

## 2018-09-15 LAB — BASIC METABOLIC PANEL
ANION GAP: 6 (ref 5–15)
BUN: 17 mg/dL (ref 8–23)
CO2: 25 mmol/L (ref 22–32)
Calcium: 8.4 mg/dL — ABNORMAL LOW (ref 8.9–10.3)
Chloride: 105 mmol/L (ref 98–111)
Creatinine, Ser: 1.46 mg/dL — ABNORMAL HIGH (ref 0.61–1.24)
GFR, EST AFRICAN AMERICAN: 52 mL/min — AB (ref >60)
GFR, EST NON AFRICAN AMERICAN: 45 mL/min — AB (ref >60)
Glucose, Bld: 123 mg/dL — ABNORMAL HIGH (ref 70–99)
POTASSIUM: 3.6 mmol/L (ref 3.5–5.1)
SODIUM: 136 mmol/L (ref 135–145)

## 2018-09-22 ENCOUNTER — Telehealth: Payer: Self-pay | Admitting: Cardiology

## 2018-09-22 NOTE — Telephone Encounter (Signed)
Had mailed letter to son, given lab results and to continue meds as planned

## 2018-09-22 NOTE — Telephone Encounter (Signed)
RETURNING CALL TO [facility] FOR LAB RESULTS

## 2018-10-09 ENCOUNTER — Emergency Department (HOSPITAL_COMMUNITY)
Admission: EM | Admit: 2018-10-09 | Discharge: 2018-10-09 | Discharge status: Against Medical Advice | Payer: Medicare Other | Source: Home / Self Care

## 2018-10-09 DIAGNOSIS — R531 Weakness: Secondary | ICD-10-CM

## 2018-10-09 DIAGNOSIS — Z5321 Procedure and treatment not carried out due to patient leaving prior to being seen by health care provider: Secondary | ICD-10-CM

## 2018-10-09 DIAGNOSIS — W19XXXA Unspecified fall, initial encounter: Secondary | ICD-10-CM | POA: Insufficient documentation

## 2018-10-09 DIAGNOSIS — J90 Pleural effusion, not elsewhere classified: Secondary | ICD-10-CM | POA: Diagnosis not present

## 2018-10-09 DIAGNOSIS — I482 Chronic atrial fibrillation, unspecified: Secondary | ICD-10-CM | POA: Diagnosis not present

## 2018-10-09 NOTE — ED Triage Notes (Signed)
PT STATES THAT HE FELL LAST NIGHT HE HAD TO HAVE HIS SON COME HELP PUT HIM BACK IN BED.

## 2018-10-09 NOTE — ED Notes (Signed)
Pt's daughter Altha Harm is pt's contact for information and d/c. [phone]

## 2018-10-11 ENCOUNTER — Encounter (HOSPITAL_COMMUNITY): Payer: Self-pay | Admitting: *Deleted

## 2018-10-11 ENCOUNTER — Emergency Department (HOSPITAL_COMMUNITY): Payer: Medicare Other

## 2018-10-11 ENCOUNTER — Inpatient Hospital Stay (HOSPITAL_COMMUNITY)
Admission: EM | Admit: 2018-10-11 | Discharge: 2018-11-12 | DRG: 308 | Disposition: E | Payer: Medicare Other | Attending: Internal Medicine | Admitting: Internal Medicine

## 2018-10-11 ENCOUNTER — Other Ambulatory Visit: Payer: Self-pay

## 2018-10-11 DIAGNOSIS — I350 Nonrheumatic aortic (valve) stenosis: Secondary | ICD-10-CM

## 2018-10-11 DIAGNOSIS — I472 Ventricular tachycardia: Secondary | ICD-10-CM | POA: Diagnosis present

## 2018-10-11 DIAGNOSIS — R0600 Dyspnea, unspecified: Secondary | ICD-10-CM | POA: Diagnosis not present

## 2018-10-11 DIAGNOSIS — Z515 Encounter for palliative care: Secondary | ICD-10-CM | POA: Diagnosis present

## 2018-10-11 DIAGNOSIS — Z8249 Family history of ischemic heart disease and other diseases of the circulatory system: Secondary | ICD-10-CM

## 2018-10-11 DIAGNOSIS — I482 Chronic atrial fibrillation, unspecified: Secondary | ICD-10-CM | POA: Diagnosis present

## 2018-10-11 DIAGNOSIS — J9600 Acute respiratory failure, unspecified whether with hypoxia or hypercapnia: Secondary | ICD-10-CM | POA: Diagnosis not present

## 2018-10-11 DIAGNOSIS — Z66 Do not resuscitate: Secondary | ICD-10-CM | POA: Diagnosis present

## 2018-10-11 DIAGNOSIS — I5043 Acute on chronic combined systolic (congestive) and diastolic (congestive) heart failure: Secondary | ICD-10-CM | POA: Diagnosis present

## 2018-10-11 DIAGNOSIS — J690 Pneumonitis due to inhalation of food and vomit: Secondary | ICD-10-CM | POA: Diagnosis not present

## 2018-10-11 DIAGNOSIS — Z885 Allergy status to narcotic agent status: Secondary | ICD-10-CM

## 2018-10-11 DIAGNOSIS — Z79899 Other long term (current) drug therapy: Secondary | ICD-10-CM

## 2018-10-11 DIAGNOSIS — Z7189 Other specified counseling: Secondary | ICD-10-CM | POA: Diagnosis not present

## 2018-10-11 DIAGNOSIS — R0602 Shortness of breath: Secondary | ICD-10-CM

## 2018-10-11 DIAGNOSIS — I252 Old myocardial infarction: Secondary | ICD-10-CM

## 2018-10-11 DIAGNOSIS — J449 Chronic obstructive pulmonary disease, unspecified: Secondary | ICD-10-CM | POA: Diagnosis present

## 2018-10-11 DIAGNOSIS — T45515A Adverse effect of anticoagulants, initial encounter: Secondary | ICD-10-CM | POA: Diagnosis not present

## 2018-10-11 DIAGNOSIS — I714 Abdominal aortic aneurysm, without rupture: Secondary | ICD-10-CM | POA: Diagnosis present

## 2018-10-11 DIAGNOSIS — Z87891 Personal history of nicotine dependence: Secondary | ICD-10-CM

## 2018-10-11 DIAGNOSIS — Z7901 Long term (current) use of anticoagulants: Secondary | ICD-10-CM | POA: Diagnosis not present

## 2018-10-11 DIAGNOSIS — I2510 Atherosclerotic heart disease of native coronary artery without angina pectoris: Secondary | ICD-10-CM | POA: Diagnosis present

## 2018-10-11 DIAGNOSIS — D696 Thrombocytopenia, unspecified: Secondary | ICD-10-CM | POA: Diagnosis present

## 2018-10-11 DIAGNOSIS — I4891 Unspecified atrial fibrillation: Secondary | ICD-10-CM | POA: Diagnosis not present

## 2018-10-11 DIAGNOSIS — R791 Abnormal coagulation profile: Secondary | ICD-10-CM | POA: Diagnosis not present

## 2018-10-11 DIAGNOSIS — J9690 Respiratory failure, unspecified, unspecified whether with hypoxia or hypercapnia: Secondary | ICD-10-CM

## 2018-10-11 DIAGNOSIS — J90 Pleural effusion, not elsewhere classified: Secondary | ICD-10-CM | POA: Diagnosis present

## 2018-10-11 DIAGNOSIS — N183 Chronic kidney disease, stage 3 unspecified: Secondary | ICD-10-CM | POA: Diagnosis present

## 2018-10-11 DIAGNOSIS — F411 Generalized anxiety disorder: Secondary | ICD-10-CM

## 2018-10-11 DIAGNOSIS — Z951 Presence of aortocoronary bypass graft: Secondary | ICD-10-CM

## 2018-10-11 DIAGNOSIS — J9620 Acute and chronic respiratory failure, unspecified whether with hypoxia or hypercapnia: Secondary | ICD-10-CM | POA: Diagnosis not present

## 2018-10-11 DIAGNOSIS — I447 Left bundle-branch block, unspecified: Secondary | ICD-10-CM | POA: Diagnosis present

## 2018-10-11 DIAGNOSIS — F419 Anxiety disorder, unspecified: Secondary | ICD-10-CM | POA: Diagnosis present

## 2018-10-11 DIAGNOSIS — I080 Rheumatic disorders of both mitral and aortic valves: Secondary | ICD-10-CM | POA: Diagnosis present

## 2018-10-11 DIAGNOSIS — E1122 Type 2 diabetes mellitus with diabetic chronic kidney disease: Secondary | ICD-10-CM | POA: Diagnosis present

## 2018-10-11 DIAGNOSIS — I5041 Acute combined systolic (congestive) and diastolic (congestive) heart failure: Secondary | ICD-10-CM

## 2018-10-11 DIAGNOSIS — E039 Hypothyroidism, unspecified: Secondary | ICD-10-CM | POA: Diagnosis present

## 2018-10-11 DIAGNOSIS — N179 Acute kidney failure, unspecified: Secondary | ICD-10-CM | POA: Diagnosis present

## 2018-10-11 DIAGNOSIS — T45511S Poisoning by anticoagulants, accidental (unintentional), sequela: Secondary | ICD-10-CM | POA: Diagnosis not present

## 2018-10-11 DIAGNOSIS — I509 Heart failure, unspecified: Secondary | ICD-10-CM

## 2018-10-11 DIAGNOSIS — K921 Melena: Secondary | ICD-10-CM | POA: Diagnosis not present

## 2018-10-11 DIAGNOSIS — J189 Pneumonia, unspecified organism: Secondary | ICD-10-CM

## 2018-10-11 DIAGNOSIS — J9621 Acute and chronic respiratory failure with hypoxia: Secondary | ICD-10-CM | POA: Diagnosis not present

## 2018-10-11 DIAGNOSIS — Z85038 Personal history of other malignant neoplasm of large intestine: Secondary | ICD-10-CM

## 2018-10-11 DIAGNOSIS — Z01818 Encounter for other preprocedural examination: Secondary | ICD-10-CM

## 2018-10-11 DIAGNOSIS — Z86010 Personal history of colonic polyps: Secondary | ICD-10-CM

## 2018-10-11 DIAGNOSIS — I255 Ischemic cardiomyopathy: Secondary | ICD-10-CM | POA: Diagnosis present

## 2018-10-11 DIAGNOSIS — I5023 Acute on chronic systolic (congestive) heart failure: Secondary | ICD-10-CM | POA: Diagnosis not present

## 2018-10-11 DIAGNOSIS — I959 Hypotension, unspecified: Secondary | ICD-10-CM | POA: Diagnosis not present

## 2018-10-11 DIAGNOSIS — K625 Hemorrhage of anus and rectum: Secondary | ICD-10-CM | POA: Diagnosis not present

## 2018-10-11 DIAGNOSIS — K219 Gastro-esophageal reflux disease without esophagitis: Secondary | ICD-10-CM | POA: Diagnosis present

## 2018-10-11 DIAGNOSIS — R195 Other fecal abnormalities: Secondary | ICD-10-CM

## 2018-10-11 DIAGNOSIS — Z8719 Personal history of other diseases of the digestive system: Secondary | ICD-10-CM

## 2018-10-11 LAB — URINALYSIS, ROUTINE W REFLEX MICROSCOPIC
Bacteria, UA: NONE SEEN
Bilirubin Urine: NEGATIVE
Glucose, UA: NEGATIVE mg/dL
Ketones, ur: NEGATIVE mg/dL
Leukocytes, UA: NEGATIVE
Nitrite: NEGATIVE
PROTEIN: NEGATIVE mg/dL
Specific Gravity, Urine: 1.026 (ref 1.005–1.030)
pH: 5 (ref 5.0–8.0)

## 2018-10-11 LAB — TYPE AND SCREEN
ABO/RH(D): O POS
Antibody Screen: NEGATIVE

## 2018-10-11 LAB — COMPREHENSIVE METABOLIC PANEL
ALT: 13 U/L (ref 0–44)
AST: 22 U/L (ref 15–41)
Albumin: 3.7 g/dL (ref 3.5–5.0)
Alkaline Phosphatase: 71 U/L (ref 38–126)
Anion gap: 11 (ref 5–15)
BUN: 30 mg/dL — ABNORMAL HIGH (ref 8–23)
CALCIUM: 8.7 mg/dL — AB (ref 8.9–10.3)
CO2: 24 mmol/L (ref 22–32)
Chloride: 102 mmol/L (ref 98–111)
Creatinine, Ser: 1.55 mg/dL — ABNORMAL HIGH (ref 0.61–1.24)
GFR calc Af Amer: 49 mL/min — ABNORMAL LOW (ref >60)
GFR, EST NON AFRICAN AMERICAN: 42 mL/min — AB (ref >60)
Glucose, Bld: 108 mg/dL — ABNORMAL HIGH (ref 70–99)
Potassium: 3.6 mmol/L (ref 3.5–5.1)
Sodium: 137 mmol/L (ref 135–145)
Total Bilirubin: 1.5 mg/dL — ABNORMAL HIGH (ref 0.3–1.2)
Total Protein: 6.9 g/dL (ref 6.5–8.1)

## 2018-10-11 LAB — CBC WITH DIFFERENTIAL/PLATELET
Abs Immature Granulocytes: 0.02 10*3/uL (ref 0.00–0.07)
Basophils Absolute: 0 10*3/uL (ref 0.0–0.1)
Basophils Relative: 0 %
Eosinophils Absolute: 0 10*3/uL (ref 0.0–0.5)
Eosinophils Relative: 1 %
HEMATOCRIT: 39.7 % (ref 39.0–52.0)
Hemoglobin: 12.4 g/dL — ABNORMAL LOW (ref 13.0–17.0)
Immature Granulocytes: 0 %
Lymphocytes Relative: 12 %
Lymphs Abs: 0.7 10*3/uL (ref 0.7–4.0)
MCH: 30.2 pg (ref 26.0–34.0)
MCHC: 31.2 g/dL (ref 30.0–36.0)
MCV: 96.6 fL (ref 80.0–100.0)
Monocytes Absolute: 0.4 10*3/uL (ref 0.1–1.0)
Monocytes Relative: 8 %
Neutro Abs: 4.2 10*3/uL (ref 1.7–7.7)
Neutrophils Relative %: 79 %
Platelets: 59 10*3/uL — ABNORMAL LOW (ref 150–400)
RBC: 4.11 MIL/uL — ABNORMAL LOW (ref 4.22–5.81)
RDW: 14 % (ref 11.5–15.5)
WBC: 5.4 10*3/uL (ref 4.0–10.5)
nRBC: 0 % (ref 0.0–0.2)

## 2018-10-11 LAB — I-STAT TROPONIN, ED
TROPONIN I, POC: 0.09 ng/mL — AB (ref 0.00–0.08)
Troponin i, poc: 0.12 ng/mL (ref 0.00–0.08)

## 2018-10-11 LAB — TROPONIN I
Troponin I: 0.21 ng/mL (ref <0.03)
Troponin I: 0.3 ng/mL (ref <0.03)

## 2018-10-11 LAB — PROTIME-INR
INR: 2.01
Prothrombin Time: 22.5 seconds — ABNORMAL HIGH (ref 11.4–15.2)

## 2018-10-11 LAB — I-STAT CG4 LACTIC ACID, ED
LACTIC ACID, VENOUS: 1.52 mmol/L (ref 0.5–1.9)
Lactic Acid, Venous: 1.98 mmol/L — ABNORMAL HIGH (ref 0.5–1.9)

## 2018-10-11 LAB — LIPASE, BLOOD: Lipase: 31 U/L (ref 11–51)

## 2018-10-11 LAB — BRAIN NATRIURETIC PEPTIDE: B Natriuretic Peptide: 792 pg/mL — ABNORMAL HIGH (ref 0.0–100.0)

## 2018-10-11 LAB — POC OCCULT BLOOD, ED: Fecal Occult Bld: POSITIVE — AB

## 2018-10-11 MED ORDER — FUROSEMIDE 10 MG/ML IJ SOLN
80.0000 mg | Freq: Once | INTRAMUSCULAR | Status: AC
  Administered 2018-10-11: 80 mg via INTRAVENOUS
  Filled 2018-10-11: qty 8

## 2018-10-11 MED ORDER — WARFARIN - PHARMACIST DOSING INPATIENT: Freq: Every day | Status: DC

## 2018-10-11 MED ORDER — DILTIAZEM HCL-DEXTROSE 100-5 MG/100ML-% IV SOLN (PREMIX)
5.0000 mg/h | INTRAVENOUS | Status: DC
  Administered 2018-10-11 – 2018-10-12 (×2): 5 mg/h via INTRAVENOUS
  Administered 2018-10-12: 7.5 mg/h via INTRAVENOUS
  Filled 2018-10-11: qty 100

## 2018-10-11 MED ORDER — WARFARIN - PHYSICIAN DOSING INPATIENT: Freq: Every day | Status: DC

## 2018-10-11 MED ORDER — DILTIAZEM LOAD VIA INFUSION
10.0000 mg | Freq: Once | INTRAVENOUS | Status: AC
  Administered 2018-10-11: 10 mg via INTRAVENOUS
  Filled 2018-10-11: qty 10

## 2018-10-11 MED ORDER — CARVEDILOL 12.5 MG PO TABS: 6.2500 mg | ORAL_TABLET | Freq: Two times a day (BID) | ORAL | Status: DC

## 2018-10-11 MED ORDER — POTASSIUM CHLORIDE CRYS ER 20 MEQ PO TBCR
20.0000 meq | EXTENDED_RELEASE_TABLET | Freq: Two times a day (BID) | ORAL | Status: DC
  Administered 2018-10-11: 20 meq via ORAL
  Filled 2018-10-11: qty 1

## 2018-10-11 MED ORDER — DILTIAZEM HCL ER COATED BEADS 120 MG PO CP24: 120.0000 mg | ORAL_CAPSULE | Freq: Every day | ORAL | Status: DC

## 2018-10-11 MED ORDER — SODIUM CHLORIDE 0.9 % IV SOLN
INTRAVENOUS | Status: DC
  Administered 2018-10-11: 12:00:00 via INTRAVENOUS

## 2018-10-11 MED ORDER — FUROSEMIDE 10 MG/ML IJ SOLN
20.0000 mg | Freq: Every day | INTRAMUSCULAR | Status: DC
  Administered 2018-10-11: 20 mg via INTRAVENOUS
  Filled 2018-10-11: qty 2

## 2018-10-11 MED ORDER — SODIUM CHLORIDE 0.9 % IV SOLN: 250.0000 mL | INTRAVENOUS | Status: DC | PRN

## 2018-10-11 MED ORDER — DILTIAZEM HCL 30 MG PO TABS
30.0000 mg | ORAL_TABLET | Freq: Three times a day (TID) | ORAL | Status: DC
  Administered 2018-10-11 – 2018-10-12 (×2): 30 mg via ORAL
  Filled 2018-10-11 (×2): qty 1

## 2018-10-11 MED ORDER — HYDROCODONE-ACETAMINOPHEN 5-325 MG PO TABS
1.0000 | ORAL_TABLET | Freq: Four times a day (QID) | ORAL | Status: DC | PRN
  Administered 2018-10-12 – 2018-10-14 (×5): 1 via ORAL
  Filled 2018-10-11 (×5): qty 1

## 2018-10-11 MED ORDER — SODIUM CHLORIDE 0.9 % IV BOLUS
250.0000 mL | Freq: Once | INTRAVENOUS | Status: AC
  Administered 2018-10-11: 250 mL via INTRAVENOUS

## 2018-10-11 MED ORDER — SODIUM CHLORIDE 0.9% FLUSH
3.0000 mL | Freq: Two times a day (BID) | INTRAVENOUS | Status: DC
  Administered 2018-10-11 – 2018-10-20 (×17): 3 mL via INTRAVENOUS

## 2018-10-11 MED ORDER — DILTIAZEM HCL-DEXTROSE 100-5 MG/100ML-% IV SOLN (PREMIX)
INTRAVENOUS | Status: AC
  Administered 2018-10-11: 10 mg via INTRAVENOUS
  Filled 2018-10-11: qty 100

## 2018-10-11 MED ORDER — SODIUM CHLORIDE 0.9% FLUSH: 3.0000 mL | INTRAVENOUS | Status: DC | PRN

## 2018-10-11 MED ORDER — WARFARIN SODIUM 2.5 MG PO TABS
2.5000 mg | ORAL_TABLET | ORAL | Status: DC
  Administered 2018-10-11: 2.5 mg via ORAL
  Filled 2018-10-11: qty 1

## 2018-10-11 MED ORDER — WARFARIN SODIUM 5 MG PO TABS
5.0000 mg | ORAL_TABLET | ORAL | Status: DC
  Administered 2018-10-12: 5 mg via ORAL
  Filled 2018-10-11: qty 1

## 2018-10-11 MED ORDER — IOHEXOL 300 MG/ML  SOLN
75.0000 mL | Freq: Once | INTRAMUSCULAR | Status: AC | PRN
  Administered 2018-10-11: 75 mL via INTRAVENOUS

## 2018-10-11 MED ORDER — ALBUTEROL SULFATE (2.5 MG/3ML) 0.083% IN NEBU
3.0000 mL | INHALATION_SOLUTION | RESPIRATORY_TRACT | Status: DC | PRN
  Administered 2018-10-11 – 2018-10-12 (×2): 3 mL via RESPIRATORY_TRACT
  Filled 2018-10-11 (×2): qty 3

## 2018-10-11 NOTE — Progress Notes (Signed)
ANTICOAGULATION CONSULT NOTE - Initial Consult  Pharmacy Consult for Coumadin Indication: atrial fibrillation  No Known Allergies  Patient Measurements: Height: 5\' 10"  (177.8 cm) Weight: 160 lb (72.6 kg) IBW/kg (Calculated) : 73  Vital Signs: Temp: 97.9 F (36.6 C) (12/31 1040) BP: 115/77 (12/31 1500) Pulse Rate: 109 (12/31 1430)  Labs: Recent Labs    09/11/2018 1155  HGB 12.4*  HCT 39.7  PLT 59*  LABPROT 22.5*  INR 2.01  CREATININE 1.55*    Estimated Creatinine Clearance: 39.7 mL/min (A) (by C-G formula based on SCr of 1.55 mg/dL (H)).   Medical History: Past Medical History:  Diagnosis Date  . Anemia   . Anxiety   . Aortic stenosis    Moderate  . Arthritis   . Atrial fibrillation ([place])   . Chronic systolic heart failure (HCC)    LVEF 40-45%  . CKD (chronic kidney disease) stage 3, GFR 30-59 ml/min (HCC)   . Colon cancer ([place])   . Colon polyps 12/2005   Per colonoscopy  . COPD (chronic obstructive pulmonary disease) ([place])   . Coronary atherosclerosis of native coronary artery    Multivessel status post CABG  . Emphysema   . Gastric erosions 12/2005   Per EGD - Dr. [name]  . GERD (gastroesophageal reflux disease)   . Hypothyroidism   . Ischemic cardiomyopathy   . Mitral regurgitation    Moderate  . Myocardial infarction [facility])    Remote  . Psoriasis   . Pulmonary nodules     Medications:  See med rec  Assessment: Pt on chronic Coumadin for h/o afib. Pharmacy asked to manage.  INR on admission is therapeutic. Home dose is 2.5mg  on Tues, Thursday, Saturday, and Sunday. 5mg  on Monday, Wednesday and Friday  Goal of Therapy:  INR 2-3 Monitor platelets by anticoagulation protocol: Yes   Plan:  Coumadin 2.5mg  today PT-INR daily Monitor for S/S of bleeding  Isac Sarna, BS [place], BCPS Clinical Pharmacist Pager [phone] 10/03/2018,4:30 PM

## 2018-10-11 NOTE — ED Notes (Signed)
Attempted to notify respiratory of neb treatment. No answer.

## 2018-10-11 NOTE — H&P (Signed)
History and Physical    Cory Ryan IZT:[id number] DOB: 31-Mar-1941 DOA: 10/09/2018  PCP: [name], MD  Patient coming from: Home  Chief Complaint: Sent by PCP for increased heart rate  HPI: Cory Ryan is a 77 y.o. male with medical history significant of chronic systolic congestive heart failure, chronic kidney disease, COPD, A. fib, chronic anticoagulation on Coumadin sent in from his primary care physician's office for A. fib with RVR.  Patient has multitude complaints he is is been having diarrhea for couple days along with worsening shortness of breath.  He denies any fevers or cough.  He denies any lower extremity edema or swelling.  He denies any chest pain.  He does have some abdominal crampiness.  Patient is being referred for admission for A. fib with RVR with congestive heart failure exacerbation.  Patient did not take his medications today.  He says he is compliant normally.  Review of Systems: As per HPI otherwise 10 point review of systems negative.   Past Medical History:  Diagnosis Date  . Anemia   . Anxiety   . Aortic stenosis    Moderate  . Arthritis   . Atrial fibrillation ([place])   . Chronic systolic heart failure (HCC)    LVEF 40-45%  . CKD (chronic kidney disease) stage 3, GFR 30-59 ml/min (HCC)   . Colon cancer ([place])   . Colon polyps 12/2005   Per colonoscopy  . COPD (chronic obstructive pulmonary disease) ([place])   . Coronary atherosclerosis of native coronary artery    Multivessel status post CABG  . Emphysema   . Gastric erosions 12/2005   Per EGD - Dr. [name]  . GERD (gastroesophageal reflux disease)   . Hypothyroidism   . Ischemic cardiomyopathy   . Mitral regurgitation    Moderate  . Myocardial infarction [facility])    Remote  . Psoriasis   . Pulmonary nodules     Past Surgical History:  Procedure Laterality Date  . COLECTOMY N/A 12/13/2013   Procedure: TOTAL COLECTOMY;  Surgeon: [name], MD;  Location: [facility] ORS;  Service: General;   Laterality: N/A;  . CORONARY ARTERY BYPASS GRAFT  March 2004   LIMA to LAD, SVG to diagonal, SVG to OM 2, SVG to RCA  . FLEXIBLE SIGMOIDOSCOPY  10/25/2012   Procedure: FLEXIBLE SIGMOIDOSCOPY;  Surgeon: [name], MD;  Location: [facility] ENDO SUITE;  Service: Gastroenterology;  Laterality: N/A;  . PARTIAL COLECTOMY  10/26/2012   Procedure: PARTIAL COLECTOMY;  Surgeon: [name], MD;  Location: [facility] ORS;  Service: General;  Laterality: N/A;  Closure of Colovesical Fistula     reports that he quit smoking about 15 years ago. His smoking use included cigarettes. He has a 30.00 pack-year smoking history. He has never used smokeless tobacco. He reports that he does not drink alcohol or use drugs.  No Known Allergies  Family History  Problem Relation Age of Onset  . Cancer Brother   . Heart Problems Mother        unknown  . Hypertension Other     Prior to Admission medications   Medication Sig Start Date End Date Taking? Authorizing Provider  carvedilol (COREG) 6.25 MG tablet TAKE (1) TABLET BY MOUTH TWICE DAILY WTIH A MEAL. Patient taking differently: Take 6.25 mg by mouth 2 (two) times daily with a meal. TAKE (1) TABLET BY MOUTH TWICE DAILY WTIH A MEAL. 03/16/18  Yes [name], MD  furosemide (LASIX) 20 MG tablet TAKE  20 MG EVERY OTHER DAY ALTERNATING WITH 30 MG EVERY OTHER DAY 09/14/18  Yes [name], MD  HYDROcodone-acetaminophen (NORCO) 5-325 MG per tablet Take 1-2 tablets by mouth every 4 (four) hours as needed. Patient taking differently: Take 1 tablet by mouth every 6 (six) hours as needed for moderate pain.  12/18/13  Yes Aviva Signs, MD  Ipratropium-Albuterol (COMBIVENT RESPIMAT) 20-100 MCG/ACT AERS respimat Inhale 1 puff into the lungs as needed. Patient taking differently: Inhale 1 puff into the lungs 4 (four) times daily.  06/15/13  Yes [name], MD  loperamide (IMODIUM) 2 MG capsule Take 1 capsule (2 mg total) by mouth as needed for diarrhea or loose  stools. Patient taking differently: Take 4 mg by mouth daily as needed for diarrhea or loose stools.  10/29/12  Yes Chelsea Primus, MD  potassium chloride SA (K-DUR,KLOR-CON) 20 MEQ tablet Take 0.5 tablets (10 mEq total) by mouth daily. Patient taking differently: Take 20 mEq by mouth 2 (two) times daily.  02/16/17  Yes Lendon Colonel, NP  SSD 1 % cream Apply 1 application topically 3 (three) times daily as needed. Applied to legs as needed 07/14/18  Yes [provider]  albuterol (PROVENTIL) (2.5 MG/3ML) 0.083% nebulizer solution Inhale 3 mLs into the lungs every 4 (four) hours as needed for wheezing or shortness of breath.  08/06/15   [provider]  diltiazem (CARDIZEM CD) 120 MG 24 hr capsule TAKE (1) CAPSULE BY MOUTH TWICE DAILY. 06/14/18   [name], MD  diltiazem (CARDIZEM CD) 120 MG 24 hr capsule TAKE (1) CAPSULE BY MOUTH TWICE DAILY. 09/14/18   [name], MD  vitamin B-12 (CYANOCOBALAMIN) 1000 MCG tablet Take 1 tablet (1,000 mcg total) by mouth daily. 03/16/17   Twana First, MD  warfarin (COUMADIN) 5 MG tablet Take 0.5-1 tablets (2.5-5 mg total) by mouth See admin instructions. TAKE 1/2 TABLET (2.5mg  total) BY MOUTH DAILY, EXCEPT TAKE 1 TABLET (5mg  total) ON MONDAYS, WEDNESDAYS AND FRIDAYS. Patient not taking: Reported on 09/30/2018 09/14/18   [name], MD    Physical Exam: Vitals:   10/04/2018 1425 10/10/2018 1430 09/13/2018 1445 09/28/2018 1500  BP:  (!) 117/98 92/66 115/77  Pulse: (!) 51 (!) 109    Resp: 18 20  20   Temp:      SpO2: 92% 94%    Weight:      Height:          Constitutional: NAD, calm, comfortable Vitals:   09/20/2018 1425 09/11/2018 1430 10/09/2018 1445 09/23/2018 1500  BP:  (!) 117/98 92/66 115/77  Pulse: (!) 51 (!) 109    Resp: 18 20  20   Temp:      SpO2: 92% 94%    Weight:      Height:       Eyes: PERRL, lids and conjunctivae normal ENMT: Mucous membranes are moist. Posterior pharynx clear of any exudate or lesions.Normal  dentition.  Neck: normal, supple, no masses, no thyromegaly Respiratory: clear to auscultation bilaterally, no wheezing, no crackles. Normal respiratory effort. No accessory muscle use.  Cardiovascular:irregular rate and irrhythm, no murmurs / rubs / gallops. No extremity edema. 2+ pedal pulses. No carotid bruits.  Abdomen: no tenderness, no masses palpated. No hepatosplenomegaly. Bowel sounds positive.  Musculoskeletal: no clubbing / cyanosis. No joint deformity upper and lower extremities. Good ROM, no contractures. Normal muscle tone.  Skin: no rashes, lesions, ulcers. No induration Neurologic: CN 2-12 grossly intact. Sensation intact, DTR normal. Strength 5/5  in all 4.  Psychiatric: Normal judgment and insight. Alert and oriented x 3. Normal mood.    Labs on Admission: I have personally reviewed following labs and imaging studies  CBC: Recent Labs  Lab 10/03/2018 1155  WBC 5.4  NEUTROABS 4.2  HGB 12.4*  HCT 39.7  MCV 96.6  PLT 59*   Basic Metabolic Panel: Recent Labs  Lab 09/25/2018 1155  NA 137  K 3.6  CL 102  CO2 24  GLUCOSE 108*  BUN 30*  CREATININE 1.55*  CALCIUM 8.7*   GFR: Estimated Creatinine Clearance: 39.7 mL/min (A) (by C-G formula based on SCr of 1.55 mg/dL (H)). Liver Function Tests: Recent Labs  Lab 09/22/2018 1155  AST 22  ALT 13  ALKPHOS 71  BILITOT 1.5*  PROT 6.9  ALBUMIN 3.7   Recent Labs  Lab 10/08/2018 1155  LIPASE 31   No results for input(s): AMMONIA in the last 168 hours. Coagulation Profile: Recent Labs  Lab 09/17/2018 1155  INR 2.01   Cardiac Enzymes: No results for input(s): CKTOTAL, CKMB, CKMBINDEX, TROPONINI in the last 168 hours. BNP (last 3 results) No results for input(s): PROBNP in the last 8760 hours. HbA1C: No results for input(s): HGBA1C in the last 72 hours. CBG: No results for input(s): GLUCAP in the last 168 hours. Lipid Profile: No results for input(s): CHOL, HDL, LDLCALC, TRIG, CHOLHDL, LDLDIRECT in the last 72  hours. Thyroid Function Tests: No results for input(s): TSH, T4TOTAL, FREET4, T3FREE, THYROIDAB in the last 72 hours. Anemia Panel: No results for input(s): VITAMINB12, FOLATE, FERRITIN, TIBC, IRON, RETICCTPCT in the last 72 hours. Urine analysis:    Component Value Date/Time   COLORURINE YELLOW 11/06/2017 1054   APPEARANCEUR CLEAR 11/06/2017 1054   LABSPEC 1.010 11/06/2017 1054   PHURINE 7.0 11/06/2017 1054   GLUCOSEU NEGATIVE 11/06/2017 1054   HGBUR MODERATE (A) 11/06/2017 1054   BILIRUBINUR NEGATIVE 11/06/2017 1054   KETONESUR NEGATIVE 11/06/2017 1054   PROTEINUR NEGATIVE 11/06/2017 1054   UROBILINOGEN 0.2 02/06/2012 1450   NITRITE NEGATIVE 11/06/2017 1054   LEUKOCYTESUR NEGATIVE 11/06/2017 1054   Sepsis Labs: !!!!!!!!!!!!!!!!!!!!!!!!!!!!!!!!!!!!!!!!!!!! @LABRCNTIP (procalcitonin:4,lacticidven:4) )No results found for this or any previous visit (from the past 240 hour(s)).   Radiological Exams on Admission: Dg Chest 2 View  Result Date: 09/19/2018 CLINICAL DATA:  Chest congestion, cough for 2 weeks, diarrhea, former smoking history EXAM: CHEST - 2 VIEW COMPARISON:  Chest x-ray of 08/07/2017 FINDINGS: There is cardiomegaly present with moderate pulmonary vascular congestion and small right effusion. These findings are most compatible with mild CHF. On the lateral view there is somewhat more prominent opacity overlying the right middle lobe or lingula most likely related to pulmonary vascular congestion, but follow-up chest x-ray is recommended to exclude developing pneumonia. Mediastinal and hilar contours are unremarkable and median sternotomy sutures appear normal. No acute bony abnormality is noted. IMPRESSION: 1. Moderate pulmonary vascular congestion with cardiomegaly and small right effusion most consistent with CHF. 2. Opacity on the lateral view within the right middle lobe or lingula probably due to pulmonary vascular congestion but developing pneumonia can not be excluded.  Consider follow-up chest x-ray. Electronically Signed   By: Ivar Drape M.D.   On: 09/25/2018 12:43   Ct Abdomen Pelvis W Contrast  Result Date: 10/06/2018 CLINICAL DATA:  Diarrhea EXAM: CT ABDOMEN AND PELVIS WITH CONTRAST TECHNIQUE: Multidetector CT imaging of the abdomen and pelvis was performed using the standard protocol following bolus administration of intravenous contrast. CONTRAST:  36mL OMNIPAQUE IOHEXOL 300  MG/ML  SOLN COMPARISON:  November 24, 2013 FINDINGS: Note that there is a degree of motion artifact. Lower chest: There is a sizable free-flowing right pleural effusion, incompletely visualized. There is consolidation in the lateral segment right middle lobe, incompletely visualized. There is calcification in the mitral valve region. There are foci of coronary artery calcification. Hepatobiliary: The liver has a subtly nodular contour. There is decreased attenuation in the liver felt to represent hepatic steatosis. No focal liver lesions are appreciable. Gallbladder wall is not appreciably thickened. There is no biliary duct dilatation. Pancreas: No evident pancreatic mass or inflammatory focus. Spleen: Spleen measures 15.2 x 10.9 x 5.8 cm with a measured splenic volume of 480 cubic cm. No focal splenic lesions are appreciable. Adrenals/Urinary Tract: Right adrenal appears normal. There is mild left adrenal hypertrophy. There is a 1 x 1 cm cyst arising from the upper pole left kidney. There is a second 1 x 1 cm cyst in the mid left kidney. There is an 8 x 7 mm cyst in the posterior mid right kidney. There is no appreciable hydronephrosis on either side. There is no appreciable renal or ureteral calculus on either side. Urinary bladder is midline with wall thickness within normal limits. Stomach/Bowel: Patient has had a subtotal colectomy. Anastomosis of bowel in the mid pelvic region appears patent. There is no appreciable bowel wall or mesenteric thickening. No evident bowel obstruction. There is  no free air or portal venous air. Vascular/Lymphatic: There is extensive aortoiliac atherosclerosis. There is aneurysmal dilatation of the aorta inferior to the renal arteries. The maximum transverse diameter of the aorta measures 4.8 x 4.8 cm. There is moderate thrombus in the periphery of the aorta. No dissection evident. Major mesenteric arterial vessels appear patent although there is extensive aortic atherosclerosis. There is no evident adenopathy in the abdomen or pelvis. Reproductive: There are several small prostatic calculi. Prostate and seminal vesicles appear normal in size and contour. No pelvic mass evident. Other: There is no abscess or ascites in the abdomen or pelvis. Musculoskeletal: There is extensive degenerative change in the lower thoracic and lumbar regions. No blastic or lytic bone lesions are evident. There is spinal stenosis at L2-3, L3-4, and L4-5 due to disc protrusion and bony hypertrophy. 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 There are no intramuscular or abdominal wall lesions. IMPRESSION: 1. Note that there is a degree of motion artifact making this study less than optimal. 2. Subtle nodularity of the liver contour, felt to represent a degree of underlying cirrhosis. Decreased attenuation throughout the liver is felt to represent superimposed hepatic steatosis. 3.  Prominent spleen without focal splenic lesion. 4. Status post subtotal colectomy with patent anastomosis. Remaining bowel shows no wall thickening or obstruction. No abscess evident. 5. Infrarenal abdominal aortic aneurysm with a maximum transverse diameter of 4.8 x 4.8 cm. Recommend followup by abdomen and pelvis CTA in 6 months, and vascular surgery referral/consultation if not already obtained. This recommendation follows ACR consensus guidelines: White Paper of the ACR Incidental Findings Committee II on Vascular Findings. J Am Coll Radiol 2013;  10:789-794. Extensive aortoiliac atherosclerosis. Foci of coronary artery calcification noted. 6.  Fairly sizable free-flowing right pleural effusion. 7. Incomplete visualization of apparent infiltrate in the lateral segment right middle lobe, likely pneumonia. Underlying mass in this area can not be entirely excluded. Chest CT after treatment for pneumonia is advisable to further and completely assess this area. 8. There  is no renal or ureteral calculus. No hydronephrosis. There are small prostatic calculi. 9. Spinal stenosis at L2-3, L3-4, and L4-5, multifactorial in etiology. 10. Mild left adrenal hypertrophy, a finding of questionable clinical significance. This is a stable finding compared to previous study. Aortic Atherosclerosis (ICD10-I70.0). Electronically Signed   By: Lowella Grip III M.D.   On: 10/06/2018 13:07    EKG: Independently reviewed.  A. fib with RVR Old chart reviewed Case discussed with Dr. Bobby Rumpf in the ED Chest x-ray reviewed has pulmonary edema  Assessment/Plan 77 year old male with A. fib with RVR and acute exacerbation of his congestive heart failure Principal Problem:   Atrial fibrillation with RVR (HCC)-orally load with Cardizem 30 mg p.o. every 8 hours as long as his blood pressure tolerates.  Wean diltiazem drip off to maintain heart rate below 100.  Patient supposed be on Coreg but will hold this at this time due to soft blood pressure.  Exacerbating CHF.  Active Problems:   Acute on chronic combined systolic and diastolic congestive heart failure (HCC)-patient received Lasix 80 mg in the ED along with IV fluids he is normally on 20 mg intermittently through the week.  Will decrease Lasix to 20 mg IV daily and stop IV fluids.  Oxygen sats normal on room air.    Long term current use of anticoagulant therapy-pharmacy to dose Coumadin    Aortic stenosis-noted    CKD (chronic kidney disease) stage 3, GFR 30-59 ml/min (HCC)-at baseline creatinine of 1.4 monitor  closely while diuresing      DVT prophylaxis: Coumadin Code Status: Full Family Communication: None Disposition Plan: 1 to 3 days Consults called: None Admission status: Admission   Kerstyn Coryell A MD [facility]  If 7PM-7AM, please contact night-coverage [email or web address] Password [email or web address]  09/12/2018, 3:33 PM

## 2018-10-11 NOTE — ED Triage Notes (Signed)
Patient has multiple problems, c/o sores on feet for over 2 weeks, tarry stools, chest congestion, right earache, sent from pcp for evaluation

## 2018-10-11 NOTE — ED Notes (Signed)
Notified Wendy, RRT, of pt's breathing treatment. Stated she would be down soon.

## 2018-10-11 NOTE — ED Notes (Signed)
CRITICAL VALUE ALERT  Critical Value:  Troponin 0.09  Date & Time Notied:  10/04/2018 1200  Provider Notified: Rogene Houston MD  Orders Received/Actions taken: n/a

## 2018-10-11 NOTE — ED Notes (Signed)
ED Provider at bedside. 

## 2018-10-11 NOTE — ED Provider Notes (Signed)
[facility] EMERGENCY DEPARTMENT Provider Note   CSN: [id number] Arrival date & time: 09/15/2018  1021     History   Chief Complaint Chief Complaint  Patient presents with  . Foot Pain  . Tachycardia  . Diarrhea    HPI Cory Ryan is a 77 y.o. male.  Patient sent in from [place] for variety of complaints.  Patient with complaint of blood in his stools chest congestion evaluated there for right earache and they also stated that he has sores on his feet.  Patient states he has some mild foot pain but no severe foot pain.  Patient's past medical history is significant for atrial fibrillation chronic systolic heart failure ischemic cardiomyopathy coronary artery disease chronic kidney disease stage III and COPD.  Patient status post CABG in the past.  Patient last presented to the emergency department in [facility] December 29 for a fall but he left without being seen.  He was seen November 25 for skin tear to his left upper arm evaluated in the emergency department and sent home.  Patient's last admission was in January 2019 for shortness of breath.  Patient's cardiology notes state that patient has a actually a history of combined diastolic systolic congestive heart failure.  Last seen by cardiology November 21.  According to that note patient remains on Coumadin.  Their note also stated that he was having fluctuating chest pain and palpitation he has had some fluctuating lower extremity edema and chronic venous stasis.  He has been running out of his Lasix.  His weight is been stable.  Follow-up echocardiogram in January 2019 showed that his left ventricular ejection fraction was 45 to 50% range.  Possible bicuspid aortic valve with mild aortic stenosis and moderate bilateral enlargement.  Patient's medications are significant for Coreg Cardizem CD and Lasix with potassium supplement and Coumadin.  All of this information was extremely difficult to get out of the patient.  He was a poor  historian.  Patient mentally alert but does seem to have some confusion about his past medical history and his current complaints.     Past Medical History:  Diagnosis Date  . Anemia   . Anxiety   . Aortic stenosis    Moderate  . Arthritis   . Atrial fibrillation ([place])   . Chronic systolic heart failure (HCC)    LVEF 40-45%  . CKD (chronic kidney disease) stage 3, GFR 30-59 ml/min (HCC)   . Colon cancer ([place])   . Colon polyps 12/2005   Per colonoscopy  . COPD (chronic obstructive pulmonary disease) ([place])   . Coronary atherosclerosis of native coronary artery    Multivessel status post CABG  . Emphysema   . Gastric erosions 12/2005   Per EGD - Dr. [name]  . GERD (gastroesophageal reflux disease)   . Hypothyroidism   . Ischemic cardiomyopathy   . Mitral regurgitation    Moderate  . Myocardial infarction [facility])    Remote  . Psoriasis   . Pulmonary nodules     Patient Active Problem List   Diagnosis Date Noted  . CHF exacerbation ([place]) 11/06/2017  . Cellulitis of left thigh 11/06/2017  . Leukopenia 02/11/2017  . COPD mixed type ([place]) 08/11/2015  . Atrial fibrillation with rapid ventricular response ([place])   . Thrombocytopenia ([place]) 08/10/2015  . Chronic atrial fibrillation 08/08/2015  . Bronchospasm 10/05/2014  . Acute on chronic combined systolic and diastolic congestive heart failure ([place]) 10/03/2014  . Encounter for therapeutic drug monitoring 12/27/2013  .  History of colon cancer   . Colon cancer ([place]) 11/21/2013  . Coronary atherosclerosis of native coronary artery 06/15/2013  . Aortic stenosis 06/15/2013  . Mitral regurgitation 06/15/2013  . CKD (chronic kidney disease) stage 3, GFR 30-59 ml/min (HCC) 06/15/2013  . Long term current use of anticoagulant therapy 12/20/2012  . Systolic CHF, chronic ([place]) 02/06/2012    Past Surgical History:  Procedure Laterality Date  . COLECTOMY N/A 12/13/2013   Procedure: TOTAL COLECTOMY;  Surgeon: [name], MD;   Location: [facility] ORS;  Service: General;  Laterality: N/A;  . CORONARY ARTERY BYPASS GRAFT  March 2004   LIMA to LAD, SVG to diagonal, SVG to OM 2, SVG to RCA  . FLEXIBLE SIGMOIDOSCOPY  10/25/2012   Procedure: FLEXIBLE SIGMOIDOSCOPY;  Surgeon: [name], MD;  Location: [facility] ENDO SUITE;  Service: Gastroenterology;  Laterality: N/A;  . PARTIAL COLECTOMY  10/26/2012   Procedure: PARTIAL COLECTOMY;  Surgeon: [name], MD;  Location: [facility] ORS;  Service: General;  Laterality: N/A;  Closure of Colovesical Fistula        Home Medications    Prior to Admission medications   Medication Sig Start Date End Date Taking? Authorizing Provider  albuterol (PROVENTIL) (2.5 MG/3ML) 0.083% nebulizer solution Inhale 3 mLs into the lungs every 4 (four) hours as needed for wheezing or shortness of breath.  08/06/15   [provider]  carvedilol (COREG) 6.25 MG tablet TAKE (1) TABLET BY MOUTH TWICE DAILY WTIH A MEAL. Patient taking differently: Take 6.25 mg by mouth 2 (two) times daily with a meal. TAKE (1) TABLET BY MOUTH TWICE DAILY WTIH A MEAL. 03/16/18   [name], MD  diltiazem (CARDIZEM CD) 120 MG 24 hr capsule TAKE (1) CAPSULE BY MOUTH TWICE DAILY. 06/14/18   [name], MD  diltiazem (CARDIZEM CD) 120 MG 24 hr capsule TAKE (1) CAPSULE BY MOUTH TWICE DAILY. 09/14/18   [name], MD  furosemide (LASIX) 20 MG tablet TAKE 20 MG EVERY OTHER DAY ALTERNATING WITH 30 MG EVERY OTHER DAY 09/14/18   [name], MD  Guaifenesin [facility] MAXIMUM STRENGTH) 1200 MG TB12 Take 1 tablet by mouth 2 (two) times daily.     [provider]  HYDROcodone-acetaminophen (NORCO) 5-325 MG per tablet Take 1-2 tablets by mouth every 4 (four) hours as needed. Patient taking differently: Take 1 tablet by mouth every 6 (six) hours as needed for moderate pain.  12/18/13   Aviva Signs, MD  Ipratropium-Albuterol (COMBIVENT RESPIMAT) 20-100 MCG/ACT AERS respimat Inhale 1 puff into the lungs as  needed. Patient taking differently: Inhale 1 puff into the lungs 3 (three) times daily as needed for wheezing or shortness of breath.  06/15/13   [name], MD  loperamide (IMODIUM) 2 MG capsule Take 1 capsule (2 mg total) by mouth as needed for diarrhea or loose stools. Patient taking differently: Take 4 mg by mouth daily as needed for diarrhea or loose stools.  10/29/12   Chelsea Primus, MD  potassium chloride SA (K-DUR,KLOR-CON) 20 MEQ tablet Take 0.5 tablets (10 mEq total) by mouth daily. Patient taking differently: Take 20 mEq by mouth 2 (two) times daily.  02/16/17   Lendon Colonel, NP  SSD 1 % cream Apply 1 application topically 3 (three) times daily as needed. Applied to legs as needed 07/14/18   [provider]  vitamin B-12 (CYANOCOBALAMIN) 1000 MCG tablet Take 1 tablet (1,000 mcg total) by mouth daily. 03/16/17   Talbert Cage,  Barbaraann Share, MD  warfarin (COUMADIN) 5 MG tablet Take 0.5-1 tablets (2.5-5 mg total) by mouth See admin instructions. TAKE 1/2 TABLET (2.5mg  total) BY MOUTH DAILY, EXCEPT TAKE 1 TABLET (5mg  total) ON MONDAYS, WEDNESDAYS AND FRIDAYS. 09/14/18   [name], MD    Family History Family History  Problem Relation Age of Onset  . Cancer Brother   . Heart Problems Mother        unknown  . Hypertension Other     Social History Social History   Tobacco Use  . Smoking status: Former Smoker    Packs/day: 1.00    Years: 30.00    Pack years: 30.00    Types: Cigarettes    Last attempt to quit: 03/17/2003    Years since quitting: 15.5  . Smokeless tobacco: Never Used  Substance Use Topics  . Alcohol use: No    Alcohol/week: 0.0 standard drinks    Comment: no alcohol use since '04  . Drug use: No     Allergies   Patient has no known allergies.   Review of Systems Review of Systems  Constitutional: Negative for fever.  HENT: Negative for congestion.   Eyes: Negative for redness.  Respiratory: Positive for cough and shortness of breath.     Cardiovascular: Positive for chest pain. Negative for leg swelling.  Gastrointestinal: Positive for abdominal pain and blood in stool.  Genitourinary: Negative for dysuria.  Musculoskeletal: Negative for back pain.  Skin: Positive for wound.  Neurological: Negative for syncope.  Hematological: Does not bruise/bleed easily.  Psychiatric/Behavioral: Negative for confusion.     Physical Exam Updated Vital Signs BP 108/83   Pulse (!) 129   Temp 97.9 F (36.6 C)   Resp (!) 23   Ht 1.778 m (5\' 10" )   Wt 72.6 kg   SpO2 95%   BMI 22.96 kg/m   Physical Exam Vitals signs and nursing note reviewed.  Constitutional:      Appearance: Normal appearance.  HENT:     Head: Normocephalic and atraumatic.     Mouth/Throat:     Mouth: Mucous membranes are moist.  Eyes:     Extraocular Movements: Extraocular movements intact.     Conjunctiva/sclera: Conjunctivae normal.     Pupils: Pupils are equal, round, and reactive to light.  Neck:     Musculoskeletal: Normal range of motion and neck supple.  Cardiovascular:     Rate and Rhythm: Tachycardia present. Rhythm irregular.  Pulmonary:     Breath sounds: Wheezing and rales present.     Comments: Tachypnea Abdominal:     General: Abdomen is flat. Bowel sounds are normal.     Palpations: Abdomen is soft.     Tenderness: There is no abdominal tenderness.  Genitourinary:    Rectum: Guaiac result positive.  Musculoskeletal: Normal range of motion.        General: No swelling.     Right lower leg: No edema.     Left lower leg: No edema.     Comments: No edema.  Patient has chronic skin changes to both lower extremities below the knee.  Has some sort of a green discoloration to his feet which appears to be some sort of diet or may be something from his sock or shoes.  It does wipe off.  Cap refill to both feet is slightly greater than 2 seconds.  Patient feet are warm.  Skin:    General: Skin is warm.  Neurological:     General: No  focal  deficit present.     Mental Status: He is alert and oriented to person, place, and time.      ED Treatments / Results  Labs (all labs ordered are listed, but only abnormal results are displayed) Labs Reviewed  COMPREHENSIVE METABOLIC PANEL - Abnormal; Notable for the following components:      Result Value   Glucose, Bld 108 (*)    BUN 30 (*)    Creatinine, Ser 1.55 (*)    Calcium 8.7 (*)    Total Bilirubin 1.5 (*)    GFR calc non Af Amer 42 (*)    GFR calc Af Amer 49 (*)    All other components within normal limits  CBC WITH DIFFERENTIAL/PLATELET - Abnormal; Notable for the following components:   RBC 4.11 (*)    Hemoglobin 12.4 (*)    Platelets 59 (*)    All other components within normal limits  PROTIME-INR - Abnormal; Notable for the following components:   Prothrombin Time 22.5 (*)    All other components within normal limits  BRAIN NATRIURETIC PEPTIDE - Abnormal; Notable for the following components:   B Natriuretic Peptide 792.0 (*)    All other components within normal limits  POC OCCULT BLOOD, ED - Abnormal; Notable for the following components:   Fecal Occult Bld POSITIVE (*)    All other components within normal limits  I-STAT TROPONIN, ED - Abnormal; Notable for the following components:   Troponin i, poc 0.09 (*)    All other components within normal limits  I-STAT CG4 LACTIC ACID, ED - Abnormal; Notable for the following components:   Lactic Acid, Venous 1.98 (*)    All other components within normal limits  LIPASE, BLOOD  URINALYSIS, ROUTINE W REFLEX MICROSCOPIC  PATHOLOGIST SMEAR REVIEW  I-STAT CG4 LACTIC ACID, ED  TYPE AND SCREEN    EKG EKG Interpretation  Date/Time:  Tuesday October 11 2018 10:52:25 EST Ventricular Rate:  128 PR Interval:    QRS Duration: 124 QT Interval:  337 QTC Calculation: 492 R Axis:   -20 Text Interpretation:  Atrial fibrillation IVCD, consider atypical LBBB Baseline wander in lead(s) I II aVR Confirmed by Fredia Sorrow [id number]) on 10/06/2018 11:10:39 AM   Radiology Dg Chest 2 View  Result Date: 09/13/2018 CLINICAL DATA:  Chest congestion, cough for 2 weeks, diarrhea, former smoking history EXAM: CHEST - 2 VIEW COMPARISON:  Chest x-ray of 08/07/2017 FINDINGS: There is cardiomegaly present with moderate pulmonary vascular congestion and small right effusion. These findings are most compatible with mild CHF. On the lateral view there is somewhat more prominent opacity overlying the right middle lobe or lingula most likely related to pulmonary vascular congestion, but follow-up chest x-ray is recommended to exclude developing pneumonia. Mediastinal and hilar contours are unremarkable and median sternotomy sutures appear normal. No acute bony abnormality is noted. IMPRESSION: 1. Moderate pulmonary vascular congestion with cardiomegaly and small right effusion most consistent with CHF. 2. Opacity on the lateral view within the right middle lobe or lingula probably due to pulmonary vascular congestion but developing pneumonia can not be excluded. Consider follow-up chest x-ray. Electronically Signed   By: Ivar Drape M.D.   On: 10/07/2018 12:43   Ct Abdomen Pelvis W Contrast  Result Date: 09/21/2018 CLINICAL DATA:  Diarrhea EXAM: CT ABDOMEN AND PELVIS WITH CONTRAST TECHNIQUE: Multidetector CT imaging of the abdomen and pelvis was performed using the standard protocol following bolus administration of intravenous contrast. CONTRAST:  53mL OMNIPAQUE IOHEXOL 300  MG/ML  SOLN COMPARISON:  November 24, 2013 FINDINGS: Note that there is a degree of motion artifact. Lower chest: There is a sizable free-flowing right pleural effusion, incompletely visualized. There is consolidation in the lateral segment right middle lobe, incompletely visualized. There is calcification in the mitral valve region. There are foci of coronary artery calcification. Hepatobiliary: The liver has a subtly nodular contour. There is decreased attenuation  in the liver felt to represent hepatic steatosis. No focal liver lesions are appreciable. Gallbladder wall is not appreciably thickened. There is no biliary duct dilatation. Pancreas: No evident pancreatic mass or inflammatory focus. Spleen: Spleen measures 15.2 x 10.9 x 5.8 cm with a measured splenic volume of 480 cubic cm. No focal splenic lesions are appreciable. Adrenals/Urinary Tract: Right adrenal appears normal. There is mild left adrenal hypertrophy. There is a 1 x 1 cm cyst arising from the upper pole left kidney. There is a second 1 x 1 cm cyst in the mid left kidney. There is an 8 x 7 mm cyst in the posterior mid right kidney. There is no appreciable hydronephrosis on either side. There is no appreciable renal or ureteral calculus on either side. Urinary bladder is midline with wall thickness within normal limits. Stomach/Bowel: Patient has had a subtotal colectomy. Anastomosis of bowel in the mid pelvic region appears patent. There is no appreciable bowel wall or mesenteric thickening. No evident bowel obstruction. There is no free air or portal venous air. Vascular/Lymphatic: There is extensive aortoiliac atherosclerosis. There is aneurysmal dilatation of the aorta inferior to the renal arteries. The maximum transverse diameter of the aorta measures 4.8 x 4.8 cm. There is moderate thrombus in the periphery of the aorta. No dissection evident. Major mesenteric arterial vessels appear patent although there is extensive aortic atherosclerosis. There is no evident adenopathy in the abdomen or pelvis. Reproductive: There are several small prostatic calculi. Prostate and seminal vesicles appear normal in size and contour. No pelvic mass evident. Other: There is no abscess or ascites in the abdomen or pelvis. Musculoskeletal: There is extensive degenerative change in the lower thoracic and lumbar regions. No blastic or lytic bone lesions are evident. There is spinal stenosis at L2-3, L3-4, and L4-5 due to disc  protrusion and bony hypertrophy. 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 There are no intramuscular or abdominal wall lesions. IMPRESSION: 1. Note that there is a degree of motion artifact making this study less than optimal. 2. Subtle nodularity of the liver contour, felt to represent a degree of underlying cirrhosis. Decreased attenuation throughout the liver is felt to represent superimposed hepatic steatosis. 3.  Prominent spleen without focal splenic lesion. 4. Status post subtotal colectomy with patent anastomosis. Remaining bowel shows no wall thickening or obstruction. No abscess evident. 5. Infrarenal abdominal aortic aneurysm with a maximum transverse diameter of 4.8 x 4.8 cm. Recommend followup by abdomen and pelvis CTA in 6 months, and vascular surgery referral/consultation if not already obtained. This recommendation follows ACR consensus guidelines: White Paper of the ACR Incidental Findings Committee II on Vascular Findings. J Am Coll Radiol 2013; 10:789-794. Extensive aortoiliac atherosclerosis. Foci of coronary artery calcification noted. 6.  Fairly sizable free-flowing right pleural effusion. 7. Incomplete visualization of apparent infiltrate in the lateral segment right middle lobe, likely pneumonia. Underlying mass in this area can not be entirely excluded. Chest CT after treatment for pneumonia is advisable to further and completely assess this area. 8. There  is no renal or ureteral calculus. No hydronephrosis. There are small prostatic calculi. 9. Spinal stenosis at L2-3, L3-4, and L4-5, multifactorial in etiology. 10. Mild left adrenal hypertrophy, a finding of questionable clinical significance. This is a stable finding compared to previous study. Aortic Atherosclerosis (ICD10-I70.0). Electronically Signed   By: Lowella Grip III M.D.   On: 09/28/2018 13:07    Procedures Procedures (including critical care  time)  CRITICAL CARE Performed by: Fredia Sorrow Total critical care time: 30 minutes Critical care time was exclusive of separately billable procedures and treating other patients. Critical care was necessary to treat or prevent imminent or life-threatening deterioration. Critical care was time spent personally by me on the following activities: development of treatment plan with patient and/or surrogate as well as nursing, discussions with consultants, evaluation of patient's response to treatment, examination of patient, obtaining history from patient or surrogate, ordering and performing treatments and interventions, ordering and review of laboratory studies, ordering and review of radiographic studies, pulse oximetry and re-evaluation of patient's condition.   Medications Ordered in ED Medications  0.9 %  sodium chloride infusion ( Intravenous New Bag/Given 09/30/2018 1203)  diltiazem (CARDIZEM) 1 mg/mL load via infusion 10 mg (has no administration in time range)    And  diltiazem (CARDIZEM) 100 mg in dextrose 5% 126mL (1 mg/mL) infusion (has no administration in time range)  furosemide (LASIX) injection 80 mg (has no administration in time range)  sodium chloride 0.9 % bolus 250 mL (0 mLs Intravenous Stopped 09/16/2018 1202)  iohexol (OMNIPAQUE) 300 MG/ML solution 75 mL (75 mLs Intravenous Contrast Given 09/14/2018 1236)     Initial Impression / Assessment and Plan / ED Course  I have reviewed the triage vital signs and the nursing notes.  Pertinent labs & imaging results that were available during my care of the patient were reviewed by me and considered in my medical decision making (see chart for details).     Patient with extensive work-up to include CT scan of the abdomen.  Also stool was brown and that was heme positive but it was not black or tarry in nature.  Patient had atrial fibrillation with RVR and was also evidence of pleural effusion.  Some evidence of CHF.  Patient  started on diltiazem drip for the atrial fib with RVR.  Patient's BNP was elevated troponin slightly elevated at 0.09.  Patient received Lasix.  Patient feet we think it is secondary to some green dye stain.  No acute abnormalities with the feet at this time.  No evidence of any significant ischemia.  Patient without significant GI bleed but stool was heme positive but it was brown in nature.  No evidence of any gross blood or melena.  Hospitalist will admit.  Chest x-ray consistent with pulmonary edema.  CT scan of the abdomen shows evidence of abdominal aortic aneurysm.  No dissection.  Patient's main concerns here today was atrial fibrillation with RVR and congestive heart failure.  Final Clinical Impressions(s) / ED Diagnoses   Final diagnoses:  Pleural effusion  Atrial fibrillation with RVR (HCC)  Heme positive stool  Acute combined systolic and diastolic congestive heart failure [facility])    ED Discharge Orders    None       Fredia Sorrow, MD 10/12/18 [phone]

## 2018-10-11 NOTE — ED Notes (Signed)
Date and time results received: 09/28/2018 4:48 PM  (use smartphrase ".now" to insert current time)  Test: Troponin Critical Value: 0.21  Name of Provider Notified: Hospitalist  Orders Received? Or Actions Taken?: Orders Received - See Orders for details

## 2018-10-11 NOTE — ED Notes (Signed)
Called pt's son per pt request. Pt talking to son now.

## 2018-10-12 ENCOUNTER — Other Ambulatory Visit: Payer: Self-pay

## 2018-10-12 DIAGNOSIS — Z7901 Long term (current) use of anticoagulants: Secondary | ICD-10-CM

## 2018-10-12 DIAGNOSIS — I5043 Acute on chronic combined systolic (congestive) and diastolic (congestive) heart failure: Secondary | ICD-10-CM

## 2018-10-12 LAB — CBC
HCT: 39.2 % (ref 39.0–52.0)
Hemoglobin: 12.2 g/dL — ABNORMAL LOW (ref 13.0–17.0)
MCH: 29.8 pg (ref 26.0–34.0)
MCHC: 31.1 g/dL (ref 30.0–36.0)
MCV: 95.6 fL (ref 80.0–100.0)
Platelets: 76 10*3/uL — ABNORMAL LOW (ref 150–400)
RBC: 4.1 MIL/uL — ABNORMAL LOW (ref 4.22–5.81)
RDW: 13.9 % (ref 11.5–15.5)
WBC: 6.3 10*3/uL (ref 4.0–10.5)
nRBC: 0 % (ref 0.0–0.2)

## 2018-10-12 LAB — BASIC METABOLIC PANEL
Anion gap: 11 (ref 5–15)
BUN: 26 mg/dL — ABNORMAL HIGH (ref 8–23)
CO2: 25 mmol/L (ref 22–32)
Calcium: 8.4 mg/dL — ABNORMAL LOW (ref 8.9–10.3)
Chloride: 102 mmol/L (ref 98–111)
Creatinine, Ser: 1.34 mg/dL — ABNORMAL HIGH (ref 0.61–1.24)
GFR calc Af Amer: 58 mL/min — ABNORMAL LOW (ref >60)
GFR, EST NON AFRICAN AMERICAN: 50 mL/min — AB (ref >60)
Glucose, Bld: 104 mg/dL — ABNORMAL HIGH (ref 70–99)
Potassium: 3.2 mmol/L — ABNORMAL LOW (ref 3.5–5.1)
Sodium: 138 mmol/L (ref 135–145)

## 2018-10-12 LAB — PROTIME-INR
INR: 2.23
Prothrombin Time: 24.4 seconds — ABNORMAL HIGH (ref 11.4–15.2)

## 2018-10-12 LAB — MRSA PCR SCREENING: MRSA by PCR: NEGATIVE

## 2018-10-12 LAB — TROPONIN I: TROPONIN I: 0.3 ng/mL — AB (ref <0.03)

## 2018-10-12 LAB — MAGNESIUM: Magnesium: 2.1 mg/dL (ref 1.7–2.4)

## 2018-10-12 MED ORDER — LORAZEPAM 2 MG/ML IJ SOLN: 0.5000 mg | Freq: Four times a day (QID) | INTRAMUSCULAR | Status: DC | PRN

## 2018-10-12 MED ORDER — DILTIAZEM HCL 100 MG IV SOLR
INTRAVENOUS | Status: AC
  Administered 2018-10-12: 5 mg/h
  Filled 2018-10-12: qty 100

## 2018-10-12 MED ORDER — LORAZEPAM 2 MG/ML IJ SOLN
0.5000 mg | INTRAMUSCULAR | Status: AC
  Administered 2018-10-12: 0.5 mg via INTRAVENOUS
  Filled 2018-10-12: qty 1

## 2018-10-12 MED ORDER — INFLUENZA VAC SPLIT HIGH-DOSE 0.5 ML IM SUSY
0.5000 mL | PREFILLED_SYRINGE | INTRAMUSCULAR | Status: DC
  Filled 2018-10-12: qty 0.5

## 2018-10-12 MED ORDER — LORAZEPAM 2 MG/ML IJ SOLN
0.5000 mg | Freq: Once | INTRAMUSCULAR | Status: AC
  Administered 2018-10-12: 0.5 mg via INTRAVENOUS
  Filled 2018-10-12: qty 1

## 2018-10-12 MED ORDER — FUROSEMIDE 20 MG PO TABS
20.0000 mg | ORAL_TABLET | Freq: Two times a day (BID) | ORAL | Status: DC
  Administered 2018-10-12 – 2018-10-13 (×3): 20 mg via ORAL
  Filled 2018-10-12 (×3): qty 1

## 2018-10-12 MED ORDER — IPRATROPIUM-ALBUTEROL 0.5-2.5 (3) MG/3ML IN SOLN
3.0000 mL | Freq: Four times a day (QID) | RESPIRATORY_TRACT | Status: DC
  Administered 2018-10-12 (×4): 3 mL via RESPIRATORY_TRACT
  Filled 2018-10-12 (×4): qty 3

## 2018-10-12 MED ORDER — POTASSIUM CHLORIDE CRYS ER 20 MEQ PO TBCR
40.0000 meq | EXTENDED_RELEASE_TABLET | Freq: Two times a day (BID) | ORAL | Status: DC
  Administered 2018-10-12 – 2018-10-13 (×3): 40 meq via ORAL
  Filled 2018-10-12 (×3): qty 2

## 2018-10-12 MED ORDER — DILTIAZEM HCL ER COATED BEADS 120 MG PO CP24
120.0000 mg | ORAL_CAPSULE | Freq: Every day | ORAL | Status: DC
  Administered 2018-10-12 – 2018-10-13 (×2): 120 mg via ORAL
  Filled 2018-10-12 (×2): qty 1

## 2018-10-12 MED ORDER — CARVEDILOL 3.125 MG PO TABS
6.2500 mg | ORAL_TABLET | Freq: Two times a day (BID) | ORAL | Status: DC
  Administered 2018-10-12 – 2018-10-13 (×3): 6.25 mg via ORAL
  Filled 2018-10-12 (×3): qty 2

## 2018-10-12 NOTE — ED Notes (Signed)
Pt remains in a-fib.

## 2018-10-12 NOTE — ED Notes (Signed)
Pt updated on plan of care, denies any complaints at present,  

## 2018-10-12 NOTE — Progress Notes (Signed)
ANTICOAGULATION CONSULT NOTE - Initial Consult  Pharmacy Consult for Coumadin Indication: atrial fibrillation  No Known Allergies  Patient Measurements: Height: 5\' 10"  (177.8 cm) Weight: 138 lb 3.7 oz (62.7 kg) IBW/kg (Calculated) : 73  Vital Signs: Temp: 98.2 F (36.8 C) (01/01 0747) Temp Source: Oral (01/01 0747) BP: 124/65 (01/01 0400) Pulse Rate: 102 (01/01 0400)  Labs: Recent Labs    09/24/2018 1155 10/04/2018 1559 09/19/2018 2121 10/12/18 0331  HGB 12.4*  --   --  12.2*  HCT 39.7  --   --  39.2  PLT 59*  --   --  76*  LABPROT 22.5*  --   --  24.4*  INR 2.01  --   --  2.23  CREATININE 1.55*  --   --  1.34*  TROPONINI  --  0.21* 0.30* 0.30*    Estimated Creatinine Clearance: 39.6 mL/min (A) (by C-G formula based on SCr of 1.34 mg/dL (H)).   Medical History: Past Medical History:  Diagnosis Date  . Anemia   . Anxiety   . Aortic stenosis    Moderate  . Arthritis   . Atrial fibrillation ([place])   . Chronic systolic heart failure (HCC)    LVEF 40-45%  . CKD (chronic kidney disease) stage 3, GFR 30-59 ml/min (HCC)   . Colon cancer ([place])   . Colon polyps 12/2005   Per colonoscopy  . COPD (chronic obstructive pulmonary disease) ([place])   . Coronary atherosclerosis of native coronary artery    Multivessel status post CABG  . Emphysema   . Gastric erosions 12/2005   Per EGD - Dr. [name]  . GERD (gastroesophageal reflux disease)   . Hypothyroidism   . Ischemic cardiomyopathy   . Mitral regurgitation    Moderate  . Myocardial infarction [facility])    Remote  . Psoriasis   . Pulmonary nodules     Medications:  See med rec  Assessment: Pt on chronic Coumadin for h/o afib. Pharmacy asked to manage.  INR on admission is therapeutic. Home dose is 2.5mg  on Tues, Thursday, Saturday, and Sunday. 5mg  on Monday, Wednesday and Friday  Goal of Therapy:  INR 2-3 Monitor platelets by anticoagulation protocol: Yes   Plan:  Coumadin 5mg  today PT-INR daily Monitor for S/S  of bleeding  Thomasenia Sales, PharmD, MBA, BCGP Clinical Pharmacist  10/12/2018,11:02 AM

## 2018-10-12 NOTE — Progress Notes (Signed)
PROGRESS NOTE    Cory Ryan  XIH:[id number]  DOB: [birth year]  DOA: 09/20/2018 PCP: [name], MD   Brief Admission Hx: Cory Ryan is a 78 y.o. male with medical history significant of chronic systolic congestive heart failure, chronic kidney disease, COPD, A. fib, chronic anticoagulation on Coumadin sent in from his primary care physician's office for A. fib with RVR.    MDM/Assessment & Plan:   1. Atrial fibrillation with RVR - pt admits he had missed his home meds.  Restart his home diltiazem and carvedilol if bp allows.  Get him off the IV diltiazem infusion today.  Follow on telemetry.   2. Acute on chronic combined systolic and diastolic CHF - He has diuresed well and feeling better.  Follow.   3. Chronic aortic stenosis - stable. Follow.  4. Chronic anticoagulation - pharmacy assisting with warfarin dosing.  5. Stage 3 CKD - stable, following with diuresis.    DVT prophylaxis: warfarin  Code Status: Full  Family Communication: patient  Disposition Plan: Home tomorrow if stable  Subjective: Pt reports that he is feeling better and he is appreciative of his care in the hospital.  No chest pain and no palpitations.    Objective: Vitals:   10/12/18 0400 10/12/18 0540 10/12/18 0747 10/12/18 1148  BP: 124/65     Pulse: (!) 102     Resp: 16     Temp:  (!) 97.3 F (36.3 C) 98.2 F (36.8 C)   TempSrc:  Oral Oral   SpO2: 94%   91%  Weight:  62.7 kg    Height:  5\' 10"  (1.778 m)      Intake/Output Summary (Last 24 hours) at 10/12/2018 1213 Last data filed at 10/12/2018 0954 Gross per 24 hour  Intake 867.23 ml  Output 1100 ml  Net -232.77 ml   Filed Weights   10/05/2018 1041 10/12/18 0540  Weight: 72.6 kg 62.7 kg     REVIEW OF SYSTEMS  As per history otherwise all reviewed and reported negative  Exam:  General exam: elderly male, awake, alert, severe seb dermatitis on face.  Respiratory system:  No increased work of breathing. Cardiovascular system:  irregularly irregular S1 & S2 heard. No JVD, murmurs, gallops, clicks or pedal edema. Gastrointestinal system: Abdomen is nondistended, soft and nontender. Normal bowel sounds heard. Central nervous system: Alert and oriented. No focal neurological deficits. Extremities: no CCE.  Data Reviewed: Basic Metabolic Panel: Recent Labs  Lab 10/09/2018 1155 10/12/18 0331  NA 137 138  K 3.6 3.2*  CL 102 102  CO2 24 25  GLUCOSE 108* 104*  BUN 30* 26*  CREATININE 1.55* 1.34*  CALCIUM 8.7* 8.4*  MG  --  2.1   Liver Function Tests: Recent Labs  Lab 09/14/2018 1155  AST 22  ALT 13  ALKPHOS 71  BILITOT 1.5*  PROT 6.9  ALBUMIN 3.7   Recent Labs  Lab 09/14/2018 1155  LIPASE 31   No results for input(s): AMMONIA in the last 168 hours. CBC: Recent Labs  Lab 10/04/2018 1155 10/12/18 0331  WBC 5.4 6.3  NEUTROABS 4.2  --   HGB 12.4* 12.2*  HCT 39.7 39.2  MCV 96.6 95.6  PLT 59* 76*   Cardiac Enzymes: Recent Labs  Lab 09/24/2018 1559 10/10/2018 2121 10/12/18 0331  TROPONINI 0.21* 0.30* 0.30*   CBG (last 3)  No results for input(s): GLUCAP in the last 72 hours. Recent Results (from the past 240 hour(s))  MRSA PCR Screening  Status: None   Collection Time: 10/12/18  5:36 AM  Result Value Ref Range Status   MRSA by PCR NEGATIVE NEGATIVE Final    Comment:        The GeneXpert MRSA Assay (FDA approved for NASAL specimens only), is one component of a comprehensive MRSA colonization surveillance program. It is not intended to diagnose MRSA infection nor to guide or monitor treatment for MRSA infections. Performed at [facility], [address] [place], [place] [address]      Studies: Dg Chest 2 View  Result Date: 09/26/2018 CLINICAL DATA:  Chest congestion, cough for 2 weeks, diarrhea, former smoking history EXAM: CHEST - 2 VIEW COMPARISON:  Chest x-ray of 08/07/2017 FINDINGS: There is cardiomegaly present with moderate pulmonary vascular congestion and small right  effusion. These findings are most compatible with mild CHF. On the lateral view there is somewhat more prominent opacity overlying the right middle lobe or lingula most likely related to pulmonary vascular congestion, but follow-up chest x-ray is recommended to exclude developing pneumonia. Mediastinal and hilar contours are unremarkable and median sternotomy sutures appear normal. No acute bony abnormality is noted. IMPRESSION: 1. Moderate pulmonary vascular congestion with cardiomegaly and small right effusion most consistent with CHF. 2. Opacity on the lateral view within the right middle lobe or lingula probably due to pulmonary vascular congestion but developing pneumonia can not be excluded. Consider follow-up chest x-ray. Electronically Signed   By: Ivar Drape M.D.   On: 10/03/2018 12:43   Ct Chest Wo Contrast  Result Date: 09/29/2018 CLINICAL DATA:  Shortness of breath, rales on physical exam, opacity on the recent chest x-ray in the right middle lobe or lingula. Follow-up EXAM: CT CHEST WITHOUT CONTRAST TECHNIQUE: Multidetector CT imaging of the chest was performed following the standard protocol without IV contrast. COMPARISON:  Chest x-ray of 10/11/2017 FINDINGS: Cardiovascular: Moderate thoracic aortic atherosclerosis is present. The pulmonary arteries also are very prominent most likely indicating a degree of pulmonary arterial hypertension. Diffuse coronary artery calcifications are noted and median sternotomy sutures are present. No pericardial effusion is seen. Mediastinum/Nodes: There are prominent mediastinal lymph nodes. A pretracheal node on image 60 series 2 measures 16 mm in diameter. And additional pretracheal node on image 61 measures 12 mm in diameter. A prevascular node on image 58 measures 15 mm. A subcarinal node on image 85 measures 15 mm in diameter. These nodes may well be postinflammatory or infectious, with neoplasm remaining a possibility. The thyroid gland is unremarkable. No  hiatal hernia is seen. Lungs/Pleura: On lung window images, there is a moderately large right pleural effusion present extending into the fissure. However, there is also patchy airspace disease within the right upper lobe and right middle lobe most consistent with multifocal pneumonia. The left lung appears clear. There may be a degree of interstitial edema present. The central airway appears patent. Upper Abdomen: Within the upper abdomen, no significant abnormality is seen. There may be debris layering within the gallbladder. Significant splenic artery calcifications are present. Musculoskeletal: The thoracic vertebrae are in normal alignment with diffuse degenerative change present. IMPRESSION: 1. Parenchymal opacity within the right upper lobe and right middle lobe consistent with multifocal pneumonia. 2. Moderate size right pleural effusion some of which tracks into the fissures. 3. Moderate thoracic aortic atherosclerosis Electronically Signed   By: Ivar Drape M.D.   On: 09/23/2018 16:56   Ct Abdomen Pelvis W Contrast  Result Date: 09/23/2018 CLINICAL DATA:  Diarrhea EXAM: CT ABDOMEN AND PELVIS WITH  CONTRAST TECHNIQUE: Multidetector CT imaging of the abdomen and pelvis was performed using the standard protocol following bolus administration of intravenous contrast. CONTRAST:  40mL OMNIPAQUE IOHEXOL 300 MG/ML  SOLN COMPARISON:  November 24, 2013 FINDINGS: Note that there is a degree of motion artifact. Lower chest: There is a sizable free-flowing right pleural effusion, incompletely visualized. There is consolidation in the lateral segment right middle lobe, incompletely visualized. There is calcification in the mitral valve region. There are foci of coronary artery calcification. Hepatobiliary: The liver has a subtly nodular contour. There is decreased attenuation in the liver felt to represent hepatic steatosis. No focal liver lesions are appreciable. Gallbladder wall is not appreciably thickened. There  is no biliary duct dilatation. Pancreas: No evident pancreatic mass or inflammatory focus. Spleen: Spleen measures 15.2 x 10.9 x 5.8 cm with a measured splenic volume of 480 cubic cm. No focal splenic lesions are appreciable. Adrenals/Urinary Tract: Right adrenal appears normal. There is mild left adrenal hypertrophy. There is a 1 x 1 cm cyst arising from the upper pole left kidney. There is a second 1 x 1 cm cyst in the mid left kidney. There is an 8 x 7 mm cyst in the posterior mid right kidney. There is no appreciable hydronephrosis on either side. There is no appreciable renal or ureteral calculus on either side. Urinary bladder is midline with wall thickness within normal limits. Stomach/Bowel: Patient has had a subtotal colectomy. Anastomosis of bowel in the mid pelvic region appears patent. There is no appreciable bowel wall or mesenteric thickening. No evident bowel obstruction. There is no free air or portal venous air. Vascular/Lymphatic: There is extensive aortoiliac atherosclerosis. There is aneurysmal dilatation of the aorta inferior to the renal arteries. The maximum transverse diameter of the aorta measures 4.8 x 4.8 cm. There is moderate thrombus in the periphery of the aorta. No dissection evident. Major mesenteric arterial vessels appear patent although there is extensive aortic atherosclerosis. There is no evident adenopathy in the abdomen or pelvis. Reproductive: There are several small prostatic calculi. Prostate and seminal vesicles appear normal in size and contour. No pelvic mass evident. Other: There is no abscess or ascites in the abdomen or pelvis. Musculoskeletal: There is extensive degenerative change in the lower thoracic and lumbar regions. No blastic or lytic bone lesions are evident. There is spinal stenosis at L2-3, L3-4, and L4-5 due to disc protrusion and bony hypertrophy. 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 There are no intramuscular or abdominal wall lesions. IMPRESSION: 1. Note that there is a degree of motion artifact making this study less than optimal. 2. Subtle nodularity of the liver contour, felt to represent a degree of underlying cirrhosis. Decreased attenuation throughout the liver is felt to represent superimposed hepatic steatosis. 3.  Prominent spleen without focal splenic lesion. 4. Status post subtotal colectomy with patent anastomosis. Remaining bowel shows no wall thickening or obstruction. No abscess evident. 5. Infrarenal abdominal aortic aneurysm with a maximum transverse diameter of 4.8 x 4.8 cm. Recommend followup by abdomen and pelvis CTA in 6 months, and vascular surgery referral/consultation if not already obtained. This recommendation follows ACR consensus guidelines: White Paper of the ACR Incidental Findings Committee II on Vascular Findings. J Am Coll Radiol 2013; 10:789-794. Extensive aortoiliac atherosclerosis. Foci of coronary artery calcification noted. 6.  Fairly sizable free-flowing right pleural effusion. 7. Incomplete visualization of apparent infiltrate in the lateral segment right middle lobe, likely  pneumonia. Underlying mass in this area can not be entirely excluded. Chest CT after treatment for pneumonia is advisable to further and completely assess this area. 8. There is no renal or ureteral calculus. No hydronephrosis. There are small prostatic calculi. 9. Spinal stenosis at L2-3, L3-4, and L4-5, multifactorial in etiology. 10. Mild left adrenal hypertrophy, a finding of questionable clinical significance. This is a stable finding compared to previous study. Aortic Atherosclerosis (ICD10-I70.0). Electronically Signed   By: Lowella Grip III M.D.   On: 09/27/2018 13:07   Scheduled Meds: . carvedilol  6.25 mg Oral BID WC  . diltiazem  120 mg Oral Daily  . furosemide  20 mg Oral BID  . [START ON 10/13/2018] Influenza vac split quadrivalent PF  0.5 mL Intramuscular  Tomorrow-1000  . ipratropium-albuterol  3 mL Nebulization QID  . potassium chloride SA  40 mEq Oral BID  . sodium chloride flush  3 mL Intravenous Q12H  . warfarin  2.5 mg Oral Q T,Th,S,Su-1800  . warfarin  5 mg Oral Q M,W,F-1800  . Warfarin - Pharmacist Dosing Inpatient   Does not apply q1800   Continuous Infusions: . sodium chloride    . diltiazem (CARDIZEM) infusion 7.5 mg/hr (10/12/18 0836)    Principal Problem:   Atrial fibrillation with RVR (HCC) Active Problems:   Long term current use of anticoagulant therapy   Aortic stenosis   CKD (chronic kidney disease) stage 3, GFR 30-59 ml/min (HCC)   Acute on chronic combined systolic and diastolic congestive heart failure [facility])   Critical Care Time spent: 32 Mins  Irwin Brakeman, MD, FAAFP [facility] Pager [phone]  If 7PM-7AM, please contact night-coverage [email or web address] Password [email or web address] 10/12/2018, 12:13 PM    LOS: 1 day

## 2018-10-12 NOTE — ED Notes (Signed)
Pt moved to hospital bed for comfort, update given,

## 2018-10-12 DEATH — deceased

## 2018-10-13 ENCOUNTER — Inpatient Hospital Stay (HOSPITAL_COMMUNITY): Payer: Medicare Other | Admitting: Anesthesiology

## 2018-10-13 ENCOUNTER — Inpatient Hospital Stay (HOSPITAL_COMMUNITY): Payer: Medicare Other

## 2018-10-13 DIAGNOSIS — I4891 Unspecified atrial fibrillation: Secondary | ICD-10-CM

## 2018-10-13 DIAGNOSIS — I2510 Atherosclerotic heart disease of native coronary artery without angina pectoris: Secondary | ICD-10-CM

## 2018-10-13 DIAGNOSIS — I5023 Acute on chronic systolic (congestive) heart failure: Secondary | ICD-10-CM

## 2018-10-13 LAB — PATHOLOGIST SMEAR REVIEW

## 2018-10-13 LAB — COMPREHENSIVE METABOLIC PANEL
ALBUMIN: 3.1 g/dL — AB (ref 3.5–5.0)
ALK PHOS: 67 U/L (ref 38–126)
ALT: 12 U/L (ref 0–44)
AST: 15 U/L (ref 15–41)
Anion gap: 9 (ref 5–15)
BUN: 28 mg/dL — ABNORMAL HIGH (ref 8–23)
CO2: 25 mmol/L (ref 22–32)
Calcium: 8.4 mg/dL — ABNORMAL LOW (ref 8.9–10.3)
Chloride: 103 mmol/L (ref 98–111)
Creatinine, Ser: 1.43 mg/dL — ABNORMAL HIGH (ref 0.61–1.24)
GFR calc Af Amer: 54 mL/min — ABNORMAL LOW (ref >60)
GFR calc non Af Amer: 46 mL/min — ABNORMAL LOW (ref >60)
GLUCOSE: 115 mg/dL — AB (ref 70–99)
Potassium: 3.7 mmol/L (ref 3.5–5.1)
SODIUM: 137 mmol/L (ref 135–145)
Total Bilirubin: 1.2 mg/dL (ref 0.3–1.2)
Total Protein: 6.2 g/dL — ABNORMAL LOW (ref 6.5–8.1)

## 2018-10-13 LAB — BLOOD GAS, ARTERIAL
Acid-Base Excess: 2 mmol/L (ref 0.0–2.0)
Acid-base deficit: 1.8 mmol/L (ref 0.0–2.0)
Bicarbonate: 22.5 mmol/L (ref 20.0–28.0)
Bicarbonate: 26.6 mmol/L (ref 20.0–28.0)
Delivery systems: POSITIVE
FIO2: 100
FIO2: 1
MODE: POSITIVE
O2 SAT: 100 %
O2 Saturation: 76.5 %
PATIENT TEMPERATURE: 36.4
Patient temperature: 37
pCO2 arterial: 37 mmHg (ref 32.0–48.0)
pCO2 arterial: 31.3 mmHg — ABNORMAL LOW (ref 32.0–48.0)
pH, Arterial: 7.396 (ref 7.350–7.450)
pH, Arterial: 7.512 — ABNORMAL HIGH (ref 7.350–7.450)
pO2, Arterial: 44 mmHg — ABNORMAL LOW (ref 83.0–108.0)
pO2, Arterial: 291 mmHg — ABNORMAL HIGH (ref 83.0–108.0)

## 2018-10-13 LAB — MAGNESIUM: Magnesium: 2.2 mg/dL (ref 1.7–2.4)

## 2018-10-13 LAB — PROTIME-INR
INR: 2.61
Prothrombin Time: 27.6 seconds — ABNORMAL HIGH (ref 11.4–15.2)

## 2018-10-13 LAB — ECHOCARDIOGRAM COMPLETE
Height: 70 in
Weight: 2299.84 oz

## 2018-10-13 LAB — TRIGLYCERIDES: Triglycerides: 89 mg/dL (ref <150)

## 2018-10-13 MED ORDER — AMOXICILLIN-POT CLAVULANATE 875-125 MG PO TABS
1.0000 | ORAL_TABLET | Freq: Two times a day (BID) | ORAL | Status: DC
  Administered 2018-10-13: 1 via ORAL
  Filled 2018-10-13: qty 1

## 2018-10-13 MED ORDER — WARFARIN SODIUM 5 MG PO TABS: 5.0000 mg | ORAL_TABLET | ORAL | Status: DC

## 2018-10-13 MED ORDER — IPRATROPIUM-ALBUTEROL 0.5-2.5 (3) MG/3ML IN SOLN
3.0000 mL | RESPIRATORY_TRACT | Status: DC
  Administered 2018-10-13 – 2018-10-15 (×12): 3 mL via RESPIRATORY_TRACT
  Filled 2018-10-13 (×12): qty 3

## 2018-10-13 MED ORDER — PREDNISONE 20 MG PO TABS
40.0000 mg | ORAL_TABLET | Freq: Every day | ORAL | Status: DC
  Administered 2018-10-13: 40 mg via ORAL
  Filled 2018-10-13: qty 2

## 2018-10-13 MED ORDER — LORAZEPAM 2 MG/ML IJ SOLN
0.5000 mg | INTRAMUSCULAR | Status: DC | PRN
  Administered 2018-10-13 – 2018-10-20 (×3): 0.5 mg via INTRAVENOUS
  Filled 2018-10-13 (×3): qty 1

## 2018-10-13 MED ORDER — METHYLPREDNISOLONE SODIUM SUCC 125 MG IJ SOLR
INTRAMUSCULAR | Status: AC
  Administered 2018-10-13: 125 mg via INTRAVENOUS
  Filled 2018-10-13: qty 2

## 2018-10-13 MED ORDER — WARFARIN - PHARMACIST DOSING INPATIENT: Status: DC

## 2018-10-13 MED ORDER — DILTIAZEM HCL 100 MG IV SOLR
INTRAVENOUS | Status: AC
  Filled 2018-10-13: qty 100

## 2018-10-13 MED ORDER — PROPOFOL 1000 MG/100ML IV EMUL
5.0000 ug/kg/min | INTRAVENOUS | Status: DC
  Administered 2018-10-13: 5 ug/kg/min via INTRAVENOUS
  Administered 2018-10-14: 40.005 ug/kg/min via INTRAVENOUS
  Administered 2018-10-14 (×4): 40 ug/kg/min via INTRAVENOUS
  Administered 2018-10-15: 40.005 ug/kg/min via INTRAVENOUS
  Administered 2018-10-15 – 2018-10-16 (×5): 30 ug/kg/min via INTRAVENOUS
  Administered 2018-10-17: 27.5 ug/kg/min via INTRAVENOUS
  Administered 2018-10-17 – 2018-10-18 (×2): 30 ug/kg/min via INTRAVENOUS
  Administered 2018-10-18: 10 ug/kg/min via INTRAVENOUS
  Administered 2018-10-19: 15 ug/kg/min via INTRAVENOUS
  Administered 2018-10-20: 30 ug/kg/min via INTRAVENOUS
  Administered 2018-10-20: 20 ug/kg/min via INTRAVENOUS
  Filled 2018-10-13 (×22): qty 100

## 2018-10-13 MED ORDER — FUROSEMIDE 10 MG/ML IJ SOLN
INTRAMUSCULAR | Status: AC
  Administered 2018-10-13: 40 mg via INTRAVENOUS
  Filled 2018-10-13: qty 4

## 2018-10-13 MED ORDER — DILTIAZEM HCL 25 MG/5ML IV SOLN
INTRAVENOUS | Status: AC
  Filled 2018-10-13: qty 5

## 2018-10-13 MED ORDER — WARFARIN SODIUM 2.5 MG PO TABS: 2.5000 mg | ORAL_TABLET | ORAL | Status: DC

## 2018-10-13 MED ORDER — METHYLPREDNISOLONE SODIUM SUCC 40 MG IJ SOLR
40.0000 mg | Freq: Four times a day (QID) | INTRAMUSCULAR | Status: DC
  Administered 2018-10-13 – 2018-10-16 (×13): 40 mg via INTRAVENOUS
  Filled 2018-10-13 (×13): qty 1

## 2018-10-13 MED ORDER — DOXYCYCLINE HYCLATE 100 MG PO TABS: 100.0000 mg | ORAL_TABLET | Freq: Two times a day (BID) | ORAL | Status: DC

## 2018-10-13 MED ORDER — FUROSEMIDE 10 MG/ML IJ SOLN
40.0000 mg | Freq: Every day | INTRAMUSCULAR | Status: DC
  Administered 2018-10-14: 40 mg via INTRAVENOUS
  Filled 2018-10-13: qty 4

## 2018-10-13 MED ORDER — DILTIAZEM HCL 100 MG IV SOLR
5.0000 mg/h | INTRAVENOUS | Status: DC
  Administered 2018-10-13: 12:00:00 via INTRAVENOUS

## 2018-10-13 MED ORDER — ORAL CARE MOUTH RINSE
15.0000 mL | OROMUCOSAL | Status: DC
  Administered 2018-10-13 – 2018-10-20 (×65): 15 mL via OROMUCOSAL

## 2018-10-13 MED ORDER — SODIUM CHLORIDE 0.9 % IV SOLN
3.0000 g | Freq: Three times a day (TID) | INTRAVENOUS | Status: DC
  Administered 2018-10-13 – 2018-10-14 (×3): 3 g via INTRAVENOUS
  Filled 2018-10-13 (×13): qty 3

## 2018-10-13 MED ORDER — CHLORHEXIDINE GLUCONATE 0.12% ORAL RINSE (MEDLINE KIT)
15.0000 mL | Freq: Two times a day (BID) | OROMUCOSAL | Status: DC
  Administered 2018-10-13 – 2018-10-20 (×15): 15 mL via OROMUCOSAL

## 2018-10-13 MED ORDER — METHYLPREDNISOLONE SODIUM SUCC 125 MG IJ SOLR
125.0000 mg | Freq: Once | INTRAMUSCULAR | Status: AC
  Administered 2018-10-13: 125 mg via INTRAVENOUS

## 2018-10-13 MED ORDER — KETOCONAZOLE 2 % EX CREA
TOPICAL_CREAM | Freq: Two times a day (BID) | CUTANEOUS | Status: DC
  Administered 2018-10-13 – 2018-10-20 (×15): via TOPICAL
  Filled 2018-10-13: qty 15

## 2018-10-13 MED ORDER — FUROSEMIDE 10 MG/ML IJ SOLN
40.0000 mg | Freq: Once | INTRAMUSCULAR | Status: AC
  Administered 2018-10-13: 40 mg via INTRAVENOUS

## 2018-10-13 MED ORDER — ETOMIDATE 2 MG/ML IV SOLN
INTRAVENOUS | Status: DC | PRN
  Administered 2018-10-13: 10 mg via INTRAVENOUS

## 2018-10-13 MED ORDER — GUAIFENESIN ER 600 MG PO TB12
1200.0000 mg | ORAL_TABLET | Freq: Two times a day (BID) | ORAL | Status: DC
  Administered 2018-10-13: 1200 mg via ORAL
  Filled 2018-10-13: qty 2

## 2018-10-13 MED ORDER — METOPROLOL TARTRATE 5 MG/5ML IV SOLN
5.0000 mg | Freq: Four times a day (QID) | INTRAVENOUS | Status: DC
  Administered 2018-10-13 – 2018-10-18 (×20): 5 mg via INTRAVENOUS
  Filled 2018-10-13 (×21): qty 5

## 2018-10-13 NOTE — Consult Note (Addendum)
Cardiology Consult    Patient ID: Cory Ryan; [id number]; August 03, 1941   Admit date: 10/10/2018 Date of Consult: 10/13/2018  Primary Care Provider: [name], MD Primary Cardiologist: Rozann Lesches, MD   Patient Profile    Cory Ryan is a 78 y.o. male with past medical history of CAD (s/p CABG in 2004), permanent atrial fibrillation (on Coumadin for anticoagulation), chronic combined systolic and diastolic CHF (EF 34-28% by echo in 2017, improved to 45-50% by repeat echo in 10/2017), HTN, HLD, mild AS, chronic thrombocytopenia, and Stage 3 CKD who is being seen today for the evaluation of atrial fibrillation with RVR and elevated troponin values at the request of Dr. Wynetta Emery.   History of Present Illness    Cory Ryan was last examined by [name] in 08/2018 and denied any recent chest pain or dyspnea on exertion at that time. He did report intermittent lower extremity edema and has been using Lasix intermittently. Was continued on Coreg 6.25mg  BID and Cardizem CD 120mg  daily for rate-control with Lasix being titrated to 20mg  daily alternating with 30mg  daily. Creatinine remained stable at 1.46 when checked on 09/15/2018.  The patient presented to [facility] ED on 09/22/2018 for evaluation of black tarry stools, chest congestion, and earaches. Initial labs showed WBC 5.4, Hgb 12.4, platelets 59, Na+ 137, K+ 3.6, and creatinine 1.55. Magnesium 2.1. Lipase 31. Lactic acid 1.98. Cyclic troponin values have been obtained and are 0.21, 0.30, and 0.30. EKG showed atrial fibrillation with RVR, HR 128, and known LBBB. CXR showed moderate pulmonary vascular congestion with cardiomegaly and a small right pleural effusion. CT Chest showing parenchymal opacity within the right upper lobe and right middle lobe consistent with multifocal pneumonia and a moderate sized right pleural effusion. CT abdomen showed nodularity of the liver felt to represent underlying cirrhosis and an infrarenal abdominal  aortic aneurysm with a maximum transverse diameter of 4.8 cm with repeat imaging recommended in 6 months.  He was admitted and started on Augmentin for pneumonia along with IV Lasix for diuresis. A repeat cardiogram has been ordered but is pending.  Patient required IV Cardizem for rate control but this was discontinued earlier this morning with plans to switch back to his PO regimen. Around 1130 after being given his PO medications, his respiratory status deteriorated. He is now on BiPAP with minimal improvement in his symptoms and the admitting team id planning for intubation. HR was previously in the 80's to 90's but is now elevated in the 140's to 160's. IV Cardizem has been restarted.   Past Medical History:  Diagnosis Date  . Anemia   . Anxiety   . Aortic stenosis    Moderate  . Arthritis   . Atrial fibrillation ([place])   . Chronic systolic heart failure (HCC)    LVEF 40-45%  . CKD (chronic kidney disease) stage 3, GFR 30-59 ml/min (HCC)   . Colon cancer ([place])   . Colon polyps 12/2005   Per colonoscopy  . COPD (chronic obstructive pulmonary disease) ([place])   . Coronary atherosclerosis of native coronary artery    Multivessel status post CABG  . Emphysema   . Gastric erosions 12/2005   Per EGD - Dr. [name]  . GERD (gastroesophageal reflux disease)   . Hypothyroidism   . Ischemic cardiomyopathy   . Mitral regurgitation    Moderate  . Myocardial infarction [facility])    Remote  . Psoriasis   . Pulmonary nodules     Past Surgical  History:  Procedure Laterality Date  . COLECTOMY N/A 12/13/2013   Procedure: TOTAL COLECTOMY;  Surgeon: [name], MD;  Location: [facility] ORS;  Service: General;  Laterality: N/A;  . CORONARY ARTERY BYPASS GRAFT  March 2004   LIMA to LAD, SVG to diagonal, SVG to OM 2, SVG to RCA  . FLEXIBLE SIGMOIDOSCOPY  10/25/2012   Procedure: FLEXIBLE SIGMOIDOSCOPY;  Surgeon: [name], MD;  Location: [facility] ENDO SUITE;  Service: Gastroenterology;  Laterality: N/A;  .  PARTIAL COLECTOMY  10/26/2012   Procedure: PARTIAL COLECTOMY;  Surgeon: [name], MD;  Location: [facility] ORS;  Service: General;  Laterality: N/A;  Closure of Colovesical Fistula     Home Medications:  Prior to Admission medications   Medication Sig Start Date End Date Taking? Authorizing Provider  carvedilol (COREG) 6.25 MG tablet TAKE (1) TABLET BY MOUTH TWICE DAILY WTIH A MEAL. Patient taking differently: Take 6.25 mg by mouth 2 (two) times daily with a meal. TAKE (1) TABLET BY MOUTH TWICE DAILY WTIH A MEAL. 03/16/18  Yes [name], MD  furosemide (LASIX) 20 MG tablet TAKE 20 MG EVERY OTHER DAY ALTERNATING WITH 30 MG EVERY OTHER DAY 09/14/18  Yes [name], MD  HYDROcodone-acetaminophen (NORCO) 5-325 MG per tablet Take 1-2 tablets by mouth every 4 (four) hours as needed. Patient taking differently: Take 1 tablet by mouth every 6 (six) hours as needed for moderate pain.  12/18/13  Yes Aviva Signs, MD  Ipratropium-Albuterol (COMBIVENT RESPIMAT) 20-100 MCG/ACT AERS respimat Inhale 1 puff into the lungs as needed. Patient taking differently: Inhale 1 puff into the lungs 4 (four) times daily.  06/15/13  Yes [name], MD  loperamide (IMODIUM) 2 MG capsule Take 1 capsule (2 mg total) by mouth as needed for diarrhea or loose stools. Patient taking differently: Take 4 mg by mouth daily as needed for diarrhea or loose stools.  10/29/12  Yes Chelsea Primus, MD  potassium chloride SA (K-DUR,KLOR-CON) 20 MEQ tablet Take 0.5 tablets (10 mEq total) by mouth daily. Patient taking differently: Take 20 mEq by mouth 2 (two) times daily.  02/16/17  Yes Lendon Colonel, NP  SSD 1 % cream Apply 1 application topically 3 (three) times daily as needed. Applied to legs as needed 07/14/18  Yes [provider]  albuterol (PROVENTIL) (2.5 MG/3ML) 0.083% nebulizer solution Inhale 3 mLs into the lungs every 4 (four) hours as needed for wheezing or shortness of breath.  08/06/15   [provider]  diltiazem (CARDIZEM CD) 120 MG 24 hr capsule TAKE (1) CAPSULE BY MOUTH TWICE DAILY. 06/14/18   [name], MD  diltiazem (CARDIZEM CD) 120 MG 24 hr capsule TAKE (1) CAPSULE BY MOUTH TWICE DAILY. 09/14/18   [name], MD  vitamin B-12 (CYANOCOBALAMIN) 1000 MCG tablet Take 1 tablet (1,000 mcg total) by mouth daily. 03/16/17   Twana First, MD  warfarin (COUMADIN) 5 MG tablet Take 0.5-1 tablets (2.5-5 mg total) by mouth See admin instructions. TAKE 1/2 TABLET (2.5mg  total) BY MOUTH DAILY, EXCEPT TAKE 1 TABLET (5mg  total) ON MONDAYS, WEDNESDAYS AND FRIDAYS. Patient not taking: Reported on 09/28/2018 09/14/18   [name], MD    Inpatient Medications: Scheduled Meds: . diltiazem      . amoxicillin-clavulanate  1 tablet Oral Q12H  . carvedilol  6.25 mg Oral BID WC  . diltiazem  120 mg Oral Daily  . furosemide  20 mg Oral BID  . guaiFENesin  1,200  mg Oral BID  . Influenza vac split quadrivalent PF  0.5 mL Intramuscular Tomorrow-1000  . ipratropium-albuterol  3 mL Nebulization Q4H  . ketoconazole   Topical BID  . potassium chloride SA  40 mEq Oral BID  . predniSONE  40 mg Oral Q breakfast  . sodium chloride flush  3 mL Intravenous Q12H  . warfarin  2.5 mg Oral Once per day on Sun Tue Thu Sat  . [START ON 10/14/2018] warfarin  5 mg Oral Once per day on Mon Wed Fri  . [START ON 10/14/2018] Warfarin - Pharmacist Dosing Inpatient   Does not apply Q24H   Continuous Infusions: . diltiazem (CARDIZEM) infusion    . sodium chloride     PRN Meds: sodium chloride, albuterol, HYDROcodone-acetaminophen, LORazepam, sodium chloride flush  Allergies:   No Known Allergies  Social History:   Social History   Socioeconomic History  . Marital status: Widowed    Spouse name: Not on file  . Number of children: Not on file  . Years of education: Not on file  . Highest education level: Not on file  Occupational History  . Not on file  Social Needs  . Financial resource  strain: Not on file  . Food insecurity:    Worry: Not on file    Inability: Not on file  . Transportation needs:    Medical: Not on file    Non-medical: Not on file  Tobacco Use  . Smoking status: Former Smoker    Packs/day: 1.00    Years: 30.00    Pack years: 30.00    Types: Cigarettes    Last attempt to quit: 03/17/2003    Years since quitting: 15.5  . Smokeless tobacco: Never Used  Substance and Sexual Activity  . Alcohol use: No    Alcohol/week: 0.0 standard drinks    Comment: no alcohol use since '04  . Drug use: No  . Sexual activity: Not Currently  Lifestyle  . Physical activity:    Days per week: Not on file    Minutes per session: Not on file  . Stress: Not on file  Relationships  . Social connections:    Talks on phone: Not on file    Gets together: Not on file    Attends religious service: Not on file    Active member of club or organization: Not on file    Attends meetings of clubs or organizations: Not on file    Relationship status: Not on file  . Intimate partner violence:    Fear of current or ex partner: Not on file    Emotionally abused: Not on file    Physically abused: Not on file    Forced sexual activity: Not on file  Other Topics Concern  . Not on file  Social History Narrative  . Not on file     Family History:    Family History  Problem Relation Age of Onset  . Cancer Brother   . Heart Problems Mother        unknown  . Hypertension Other       Review of Systems    Unable to be obtained given patient's current medical status. On BiPAP and about to be intubated.   Physical Exam/Data    Vitals:   10/13/18 0752 10/13/18 0800 10/13/18 0848 10/13/18 1138  BP:  111/77    Pulse:  86    Resp:  (!) 21    Temp: (!) 97.1 F (36.2 C)  97.6 F (36.4 C)  TempSrc: Axillary   Oral  SpO2:  95% 96%   Weight:      Height:        Intake/Output Summary (Last 24 hours) at 10/13/2018 1201 Last data filed at 10/13/2018 2505 Gross per 24 hour    Intake 147.86 ml  Output 150 ml  Net -2.14 ml   Filed Weights   09/11/2018 1041 10/12/18 0540 10/13/18 0500  Weight: 72.6 kg 62.7 kg 65.2 kg   Body mass index is 20.62 kg/m.   General: Elderly Caucasian male, appearing in distress. Currently on BiPAP.  Psych: Normal affect. Neuro: Alert and oriented X 3. Moves all extremities spontaneously. HEENT: Normal  Neck: Supple without bruits or JVD. Lungs:  Resp regular and unlabored, rhonchi throughout. Heart: Irregularly irregular, no s3, s4, 2/6 SEM along RUSB.  Abdomen: Soft, non-tender, non-distended, BS + x 4.  Extremities: No clubbing, cyanosis or edema. DP/PT/Radials 2+ and equal bilaterally.    Labs/Studies     Relevant CV Studies:  Echocardiogram: 10/2017 Study Conclusions  - Left ventricle: The cavity size was mildly dilated. There was   mild concentric hypertrophy. Systolic function was mildly   reduced. The estimated ejection fraction was in the range of 45%   to 50%. There is akinesis of the inferior myocardium. - Aortic valve: A bicuspid morphology cannot be excluded; severely   thickened, severely calcified leaflets. There was mild stenosis.   Mean gradient (S): 8 mm Hg. Valve area (VTI): 1.08 cm^2. Valve   area (Vmax): 1.02 cm^2. Valve area (Vmean): 1.14 cm^2. - Mitral valve: Calcified annulus. There was moderate   regurgitation. - Left atrium: The atrium was moderately dilated. - Right atrium: The atrium was moderately dilated. - Atrial septum: There was increased thickness of the septum,   consistent with lipomatous hypertrophy. - Pulmonary arteries: PA peak pressure: 44 mm Hg (S).  Impressions:  - The right ventricular systolic pressure was increased consistent   with moderate pulmonary hypertension.  Laboratory Data:  Chemistry Recent Labs  Lab 10/07/2018 1155 10/12/18 0331 10/13/18 0547  NA 137 138 137  K 3.6 3.2* 3.7  CL 102 102 103  CO2 24 25 25   GLUCOSE 108* 104* 115*  BUN 30* 26* 28*   CREATININE 1.55* 1.34* 1.43*  CALCIUM 8.7* 8.4* 8.4*  GFRNONAA 42* 50* 46*  GFRAA 49* 58* 54*  ANIONGAP 11 11 9     Recent Labs  Lab 10/02/2018 1155 10/13/18 0547  PROT 6.9 6.2*  ALBUMIN 3.7 3.1*  AST 22 15  ALT 13 12  ALKPHOS 71 67  BILITOT 1.5* 1.2   Hematology Recent Labs  Lab 09/15/2018 1155 10/12/18 0331  WBC 5.4 6.3  RBC 4.11* 4.10*  HGB 12.4* 12.2*  HCT 39.7 39.2  MCV 96.6 95.6  MCH 30.2 29.8  MCHC 31.2 31.1  RDW 14.0 13.9  PLT 59* 76*   Cardiac Enzymes Recent Labs  Lab 09/21/2018 1559 09/26/2018 2121 10/12/18 0331  TROPONINI 0.21* 0.30* 0.30*    Recent Labs  Lab 10/10/2018 1149 10/07/2018 1405  TROPIPOC 0.09* 0.12*    BNP Recent Labs  Lab 09/18/2018 1155  BNP 792.0*    DDimer No results for input(s): DDIMER in the last 168 hours.  Radiology/Studies:  Dg Chest 2 View  Result Date: 10/02/2018 CLINICAL DATA:  Chest congestion, cough for 2 weeks, diarrhea, former smoking history EXAM: CHEST - 2 VIEW COMPARISON:  Chest x-ray of 08/07/2017 FINDINGS: There is cardiomegaly present with moderate pulmonary vascular  congestion and small right effusion. These findings are most compatible with mild CHF. On the lateral view there is somewhat more prominent opacity overlying the right middle lobe or lingula most likely related to pulmonary vascular congestion, but follow-up chest x-ray is recommended to exclude developing pneumonia. Mediastinal and hilar contours are unremarkable and median sternotomy sutures appear normal. No acute bony abnormality is noted. IMPRESSION: 1. Moderate pulmonary vascular congestion with cardiomegaly and small right effusion most consistent with CHF. 2. Opacity on the lateral view within the right middle lobe or lingula probably due to pulmonary vascular congestion but developing pneumonia can not be excluded. Consider follow-up chest x-ray. Electronically Signed   By: Ivar Drape M.D.   On: 09/24/2018 12:43   Ct Chest Wo Contrast  Result Date:  09/13/2018 CLINICAL DATA:  Shortness of breath, rales on physical exam, opacity on the recent chest x-ray in the right middle lobe or lingula. Follow-up EXAM: CT CHEST WITHOUT CONTRAST TECHNIQUE: Multidetector CT imaging of the chest was performed following the standard protocol without IV contrast. COMPARISON:  Chest x-ray of 10/11/2017 FINDINGS: Cardiovascular: Moderate thoracic aortic atherosclerosis is present. The pulmonary arteries also are very prominent most likely indicating a degree of pulmonary arterial hypertension. Diffuse coronary artery calcifications are noted and median sternotomy sutures are present. No pericardial effusion is seen. Mediastinum/Nodes: There are prominent mediastinal lymph nodes. A pretracheal node on image 60 series 2 measures 16 mm in diameter. And additional pretracheal node on image 61 measures 12 mm in diameter. A prevascular node on image 58 measures 15 mm. A subcarinal node on image 85 measures 15 mm in diameter. These nodes may well be postinflammatory or infectious, with neoplasm remaining a possibility. The thyroid gland is unremarkable. No hiatal hernia is seen. Lungs/Pleura: On lung window images, there is a moderately large right pleural effusion present extending into the fissure. However, there is also patchy airspace disease within the right upper lobe and right middle lobe most consistent with multifocal pneumonia. The left lung appears clear. There may be a degree of interstitial edema present. The central airway appears patent. Upper Abdomen: Within the upper abdomen, no significant abnormality is seen. There may be debris layering within the gallbladder. Significant splenic artery calcifications are present. Musculoskeletal: The thoracic vertebrae are in normal alignment with diffuse degenerative change present. IMPRESSION: 1. Parenchymal opacity within the right upper lobe and right middle lobe consistent with multifocal pneumonia. 2. Moderate size right  pleural effusion some of which tracks into the fissures. 3. Moderate thoracic aortic atherosclerosis Electronically Signed   By: Ivar Drape M.D.   On: 10/08/2018 16:56   Ct Abdomen Pelvis W Contrast  Result Date: 09/15/2018 CLINICAL DATA:  Diarrhea EXAM: CT ABDOMEN AND PELVIS WITH CONTRAST TECHNIQUE: Multidetector CT imaging of the abdomen and pelvis was performed using the standard protocol following bolus administration of intravenous contrast. CONTRAST:  33mL OMNIPAQUE IOHEXOL 300 MG/ML  SOLN COMPARISON:  November 24, 2013 FINDINGS: Note that there is a degree of motion artifact. Lower chest: There is a sizable free-flowing right pleural effusion, incompletely visualized. There is consolidation in the lateral segment right middle lobe, incompletely visualized. There is calcification in the mitral valve region. There are foci of coronary artery calcification. Hepatobiliary: The liver has a subtly nodular contour. There is decreased attenuation in the liver felt to represent hepatic steatosis. No focal liver lesions are appreciable. Gallbladder wall is not appreciably thickened. There is no biliary duct dilatation. Pancreas: No evident pancreatic mass or inflammatory  focus. Spleen: Spleen measures 15.2 x 10.9 x 5.8 cm with a measured splenic volume of 480 cubic cm. No focal splenic lesions are appreciable. Adrenals/Urinary Tract: Right adrenal appears normal. There is mild left adrenal hypertrophy. There is a 1 x 1 cm cyst arising from the upper pole left kidney. There is a second 1 x 1 cm cyst in the mid left kidney. There is an 8 x 7 mm cyst in the posterior mid right kidney. There is no appreciable hydronephrosis on either side. There is no appreciable renal or ureteral calculus on either side. Urinary bladder is midline with wall thickness within normal limits. Stomach/Bowel: Patient has had a subtotal colectomy. Anastomosis of bowel in the mid pelvic region appears patent. There is no appreciable bowel  wall or mesenteric thickening. No evident bowel obstruction. There is no free air or portal venous air. Vascular/Lymphatic: There is extensive aortoiliac atherosclerosis. There is aneurysmal dilatation of the aorta inferior to the renal arteries. The maximum transverse diameter of the aorta measures 4.8 x 4.8 cm. There is moderate thrombus in the periphery of the aorta. No dissection evident. Major mesenteric arterial vessels appear patent although there is extensive aortic atherosclerosis. There is no evident adenopathy in the abdomen or pelvis. Reproductive: There are several small prostatic calculi. Prostate and seminal vesicles appear normal in size and contour. No pelvic mass evident. Other: There is no abscess or ascites in the abdomen or pelvis. Musculoskeletal: There is extensive degenerative change in the lower thoracic and lumbar regions. No blastic or lytic bone lesions are evident. There is spinal stenosis at L2-3, L3-4, and L4-5 due to disc protrusion and bony hypertrophy. 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 There are no intramuscular or abdominal wall lesions. IMPRESSION: 1. Note that there is a degree of motion artifact making this study less than optimal. 2. Subtle nodularity of the liver contour, felt to represent a degree of underlying cirrhosis. Decreased attenuation throughout the liver is felt to represent superimposed hepatic steatosis. 3.  Prominent spleen without focal splenic lesion. 4. Status post subtotal colectomy with patent anastomosis. Remaining bowel shows no wall thickening or obstruction. No abscess evident. 5. Infrarenal abdominal aortic aneurysm with a maximum transverse diameter of 4.8 x 4.8 cm. Recommend followup by abdomen and pelvis CTA in 6 months, and vascular surgery referral/consultation if not already obtained. This recommendation follows ACR consensus guidelines: White Paper of the ACR Incidental  Findings Committee II on Vascular Findings. J Am Coll Radiol 2013; 10:789-794. Extensive aortoiliac atherosclerosis. Foci of coronary artery calcification noted. 6.  Fairly sizable free-flowing right pleural effusion. 7. Incomplete visualization of apparent infiltrate in the lateral segment right middle lobe, likely pneumonia. Underlying mass in this area can not be entirely excluded. Chest CT after treatment for pneumonia is advisable to further and completely assess this area. 8. There is no renal or ureteral calculus. No hydronephrosis. There are small prostatic calculi. 9. Spinal stenosis at L2-3, L3-4, and L4-5, multifactorial in etiology. 10. Mild left adrenal hypertrophy, a finding of questionable clinical significance. This is a stable finding compared to previous study. Aortic Atherosclerosis (ICD10-I70.0). Electronically Signed   By: Lowella Grip III M.D.   On: 09/24/2018 13:07   Dg Chest Port 1 View  Result Date: 10/13/2018 CLINICAL DATA:  Congestive heart failure.  Multifocal pneumonia. EXAM: PORTABLE CHEST 1 VIEW COMPARISON:  Radiographs 08/07/2018 and 09/23/2018.  CT 09/18/2018. FINDINGS: 1020 hours. Stable cardiomegaly post  median sternotomy and CABG. There are worsening diffuse bilateral airspace opacities with poor definition of the pulmonary vasculature. Confluent components are noted in the right infrahilar region, corresponding with probable pneumonia in the right middle and upper lobes on CT. There are small bilateral pleural effusions. Azygos fissure noted. IMPRESSION: Worsening bilateral airspace opacities, probably edema superimposed on right-sided pneumonia as correlated with recent CT. Electronically Signed   By: Richardean Sale M.D.   On: 10/13/2018 10:35     Assessment & Plan    1. Atrial Fibrillation with RVR - The patient has known permanent atrial fibrillation which was previously well controlled with Coreg 6.25mg  BID and Cardizem CD 120mg  daily. Presented with atrial  fibrillation with RVR in the setting of PNA and COPD exacerbation. Initially required IV Cardizem but upon switching back to his PO regimen this morning, he aspirated and HR currently in the 140's to 160's. Will re-start IV Cardizem and titrate as BP allows. Continue IV Cardizem for rate-control in the setting of aspiration.  - he is on Coumadin for anticoagulation. Would bridge with Heparin if not able to take PO medications. Follow platelet count closely given chronic thrombocytopenia.   2. CAD/ Elevated Troponin Values - s/p CABG in 2004 with last ischemic evaluation being in 2014. Cyclic troponin values have been obtained and flat at 0.21, 0.30, and 0.30. EKG shows atrial fibrillation with RVR, HR 128, and known LBBB. Repeat echocardiogram is pending to assess LV function and wall motion.  - not on ASA given the need for anticoagulation. BB held given NPO status following recent event. Unclear why not on statin therapy.   3. Acute on Chronic Combined Systolic and Diastolic CHF - EF 64-33% by echo in 2017, improved to 45-50% by repeat echo in 10/2017. BNP elevated to 792 upon admission and CXR showed evidence of fluid overload. He received IV Lasix at the time of admission but was switched back to PO yesterday. Continue to follow I&O's along with daily weights. Will likely require IV dosing tomorrow given inability to take PO.   4. Aortic Stenosis - mild by echocardiogram in 10/2017. Repeat echo is pending once HR improves.   5. Stage 3 CKD - baseline creatinine 1.3 - 1.4. At 1.55 upon admission, improved to 1.43 this AM.   6. Acute Hypoxic Respiratory Failure - likely secondary to aspiration PNA as respiratory status declined after taking PO medications. Currently on BiPAP with plans for intubation.  - per admitting team.  7. Chronic Thrombocytopenia - platelet count 76K on most recent check. Continue to follow.    For questions or updates, please contact [place] Please consult  [email or web address] for contact info under Cardiology/STEMI.  Signed, Erma Heritage, PA-C 10/13/2018, 12:01 PM Pager: [phone]  Patient seen and examined   I have reviewed and agree with assessment of B Strader above Pt is a 78 yo who is followed by Myles Gip   Hx of CAD, LV dysfunciton, AS and atrial fibrillation   Also hx of thrombocytopenia Admitted with afib with RVR    He was in resp distress earliear and is now intubated ON exam, pt sedated Lungs Rhonchi bilaterally  Neck JVP is normal Cardiac Irreg irreg   II/VI sysotlic murmur No S3 Abd is benign Ext   Warm  2+ pulses   NO significant edema Skin   Chronic stasis changes legs   Tele now with afib 80s  (on dilt gtt at 5 mg/hour)  Echo done shows LVEF 35 to 40%  with inferior akinesis,   THis is down from last echo   THere is mild AS (AV may be functionally bicuspid)      1  Acute on chronic systolic CHF  Pt received IV lasix earlier    FOllow response   Would reeval in am   LVEF on echo may reflect rapid rates and hypoxia   May improve with medical Rx  2   Atrial fibrillation   With depressed LVEF would rx with intermitt iv lopressor and wean dilt. Pt on coumadin but with thrombocytopenia history really need to review    ONce plts below 60K increase risk of bleeding   3  CAD   Minimal elevation of troponin.   May reflect CAD and afib with RVR  Dorris Carnes

## 2018-10-13 NOTE — Progress Notes (Signed)
PROGRESS NOTE  Cory Ryan  PQZ:[id number]  DOB: 1940/10/27  DOA: 10/07/2018 PCP: [name], MD   Brief Admission Hx: Cory Ryan is a 78 y.o. male with medical history significant of chronic systolic congestive heart failure, chronic kidney disease, COPD, A. fib, chronic anticoagulation on Coumadin sent in from his primary care physician's office for A. fib with RVR.    MDM/Assessment & Plan:   1. Atrial fibrillation with RVR -He is off IV diltiazem, restarted on carvedilol BID and diltiazem CD. Rate is better controlled. BP is soft but holding.   2. Acute on chronic combined systolic and diastolic CHF - He has diuresed well.  2D echocardiogram pending.  Follow.   3. Elevated troponin - suspect this is demand ischemia from respiratory distress and acute CHF.  2D echocardiogram pending.  Cardiology consult requested.  4. Multifocal pneumonia - Augmentin 875 mg BID x 5 days.  Aspiration precautions.  SLP evaluation requested.  5. Acute on chronic respiratory failure - likely exacerbated by pneumonia and COPD.  Continue nebs, add prednisone 40 mg daily.  Continue supplemental oxygen.  6. Chronic aortic stenosis - stable. Follow.  7. Chronic anticoagulation - pharmacy assisting with warfarin dosing.  8. Stage 3 CKD - stable, following with diuresis.    DVT prophylaxis: warfarin  Code Status: Full  Family Communication: patient  Disposition Plan: Not medically ready to discharge  Subjective: Pt without complaints.  Objective: Vitals:   10/13/18 0700 10/13/18 0752 10/13/18 0800 10/13/18 0848  BP: 94/63  111/77   Pulse: 79  86   Resp: 17  (!) 21   Temp:  (!) 97.1 F (36.2 C)    TempSrc:  Axillary    SpO2: 100%  95% 96%  Weight:      Height:        Intake/Output Summary (Last 24 hours) at 10/13/2018 0929 Last data filed at 10/13/2018 3354 Gross per 24 hour  Intake 367.86 ml  Output 300 ml  Net 67.86 ml   Filed Weights   09/16/2018 1041 10/12/18 0540 10/13/18 0500    Weight: 72.6 kg 62.7 kg 65.2 kg    REVIEW OF SYSTEMS  As per history otherwise all reviewed and reported negative  Exam:  General exam: elderly male, sleepy but arousable, alert, severe seb dermatitis on face.  Respiratory system:  diffuse insp/exp wheezes heard and rales.  Mild increased work of breathing. Cardiovascular system: irregularly irregular S1 & S2 heard. No JVD, murmurs, gallops, clicks or pedal edema. Gastrointestinal system: Abdomen is nondistended, soft and nontender. Normal bowel sounds heard. Central nervous system: Alert and oriented. No focal neurological deficits. Extremities: no CCE.  Data Reviewed: Basic Metabolic Panel: Recent Labs  Lab 10/01/2018 1155 10/12/18 0331 10/13/18 0547  NA 137 138 137  K 3.6 3.2* 3.7  CL 102 102 103  CO2 24 25 25   GLUCOSE 108* 104* 115*  BUN 30* 26* 28*  CREATININE 1.55* 1.34* 1.43*  CALCIUM 8.7* 8.4* 8.4*  MG  --  2.1 2.2   Liver Function Tests: Recent Labs  Lab 09/20/2018 1155 10/13/18 0547  AST 22 15  ALT 13 12  ALKPHOS 71 67  BILITOT 1.5* 1.2  PROT 6.9 6.2*  ALBUMIN 3.7 3.1*   Recent Labs  Lab 09/29/2018 1155  LIPASE 31   No results for input(s): AMMONIA in the last 168 hours. CBC: Recent Labs  Lab 09/23/2018 1155 10/12/18 0331  WBC 5.4 6.3  NEUTROABS 4.2  --   HGB 12.4*  12.2*  HCT 39.7 39.2  MCV 96.6 95.6  PLT 59* 76*   Cardiac Enzymes: Recent Labs  Lab 09/12/2018 1559 09/14/2018 2121 10/12/18 0331  TROPONINI 0.21* 0.30* 0.30*   CBG (last 3)  No results for input(s): GLUCAP in the last 72 hours. Recent Results (from the past 240 hour(s))  MRSA PCR Screening     Status: None   Collection Time: 10/12/18  5:36 AM  Result Value Ref Range Status   MRSA by PCR NEGATIVE NEGATIVE Final    Comment:        The GeneXpert MRSA Assay (FDA approved for NASAL specimens only), is one component of a comprehensive MRSA colonization surveillance program. It is not intended to diagnose MRSA infection nor to  guide or monitor treatment for MRSA infections. Performed at [facility], [address] [place], [place] [address]      Studies: Dg Chest 2 View  Result Date: 09/17/2018 CLINICAL DATA:  Chest congestion, cough for 2 weeks, diarrhea, former smoking history EXAM: CHEST - 2 VIEW COMPARISON:  Chest x-ray of 08/07/2017 FINDINGS: There is cardiomegaly present with moderate pulmonary vascular congestion and small right effusion. These findings are most compatible with mild CHF. On the lateral view there is somewhat more prominent opacity overlying the right middle lobe or lingula most likely related to pulmonary vascular congestion, but follow-up chest x-ray is recommended to exclude developing pneumonia. Mediastinal and hilar contours are unremarkable and median sternotomy sutures appear normal. No acute bony abnormality is noted. IMPRESSION: 1. Moderate pulmonary vascular congestion with cardiomegaly and small right effusion most consistent with CHF. 2. Opacity on the lateral view within the right middle lobe or lingula probably due to pulmonary vascular congestion but developing pneumonia can not be excluded. Consider follow-up chest x-ray. Electronically Signed   By: Ivar Drape M.D.   On: 10/06/2018 12:43   Ct Chest Wo Contrast  Result Date: 09/22/2018 CLINICAL DATA:  Shortness of breath, rales on physical exam, opacity on the recent chest x-ray in the right middle lobe or lingula. Follow-up EXAM: CT CHEST WITHOUT CONTRAST TECHNIQUE: Multidetector CT imaging of the chest was performed following the standard protocol without IV contrast. COMPARISON:  Chest x-ray of 10/11/2017 FINDINGS: Cardiovascular: Moderate thoracic aortic atherosclerosis is present. The pulmonary arteries also are very prominent most likely indicating a degree of pulmonary arterial hypertension. Diffuse coronary artery calcifications are noted and median sternotomy sutures are present. No pericardial effusion is seen.  Mediastinum/Nodes: There are prominent mediastinal lymph nodes. A pretracheal node on image 60 series 2 measures 16 mm in diameter. And additional pretracheal node on image 61 measures 12 mm in diameter. A prevascular node on image 58 measures 15 mm. A subcarinal node on image 85 measures 15 mm in diameter. These nodes may well be postinflammatory or infectious, with neoplasm remaining a possibility. The thyroid gland is unremarkable. No hiatal hernia is seen. Lungs/Pleura: On lung window images, there is a moderately large right pleural effusion present extending into the fissure. However, there is also patchy airspace disease within the right upper lobe and right middle lobe most consistent with multifocal pneumonia. The left lung appears clear. There may be a degree of interstitial edema present. The central airway appears patent. Upper Abdomen: Within the upper abdomen, no significant abnormality is seen. There may be debris layering within the gallbladder. Significant splenic artery calcifications are present. Musculoskeletal: The thoracic vertebrae are in normal alignment with diffuse degenerative change present. IMPRESSION: 1. Parenchymal opacity within the right  upper lobe and right middle lobe consistent with multifocal pneumonia. 2. Moderate size right pleural effusion some of which tracks into the fissures. 3. Moderate thoracic aortic atherosclerosis Electronically Signed   By: Ivar Drape M.D.   On: 10/06/2018 16:56   Ct Abdomen Pelvis W Contrast  Result Date: 09/22/2018 CLINICAL DATA:  Diarrhea EXAM: CT ABDOMEN AND PELVIS WITH CONTRAST TECHNIQUE: Multidetector CT imaging of the abdomen and pelvis was performed using the standard protocol following bolus administration of intravenous contrast. CONTRAST:  108mL OMNIPAQUE IOHEXOL 300 MG/ML  SOLN COMPARISON:  November 24, 2013 FINDINGS: Note that there is a degree of motion artifact. Lower chest: There is a sizable free-flowing right pleural effusion,  incompletely visualized. There is consolidation in the lateral segment right middle lobe, incompletely visualized. There is calcification in the mitral valve region. There are foci of coronary artery calcification. Hepatobiliary: The liver has a subtly nodular contour. There is decreased attenuation in the liver felt to represent hepatic steatosis. No focal liver lesions are appreciable. Gallbladder wall is not appreciably thickened. There is no biliary duct dilatation. Pancreas: No evident pancreatic mass or inflammatory focus. Spleen: Spleen measures 15.2 x 10.9 x 5.8 cm with a measured splenic volume of 480 cubic cm. No focal splenic lesions are appreciable. Adrenals/Urinary Tract: Right adrenal appears normal. There is mild left adrenal hypertrophy. There is a 1 x 1 cm cyst arising from the upper pole left kidney. There is a second 1 x 1 cm cyst in the mid left kidney. There is an 8 x 7 mm cyst in the posterior mid right kidney. There is no appreciable hydronephrosis on either side. There is no appreciable renal or ureteral calculus on either side. Urinary bladder is midline with wall thickness within normal limits. Stomach/Bowel: Patient has had a subtotal colectomy. Anastomosis of bowel in the mid pelvic region appears patent. There is no appreciable bowel wall or mesenteric thickening. No evident bowel obstruction. There is no free air or portal venous air. Vascular/Lymphatic: There is extensive aortoiliac atherosclerosis. There is aneurysmal dilatation of the aorta inferior to the renal arteries. The maximum transverse diameter of the aorta measures 4.8 x 4.8 cm. There is moderate thrombus in the periphery of the aorta. No dissection evident. Major mesenteric arterial vessels appear patent although there is extensive aortic atherosclerosis. There is no evident adenopathy in the abdomen or pelvis. Reproductive: There are several small prostatic calculi. Prostate and seminal vesicles appear normal in size and  contour. No pelvic mass evident. Other: There is no abscess or ascites in the abdomen or pelvis. Musculoskeletal: There is extensive degenerative change in the lower thoracic and lumbar regions. No blastic or lytic bone lesions are evident. There is spinal stenosis at L2-3, L3-4, and L4-5 due to disc protrusion and bony hypertrophy. 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 There are no intramuscular or abdominal wall lesions. IMPRESSION: 1. Note that there is a degree of motion artifact making this study less than optimal. 2. Subtle nodularity of the liver contour, felt to represent a degree of underlying cirrhosis. Decreased attenuation throughout the liver is felt to represent superimposed hepatic steatosis. 3.  Prominent spleen without focal splenic lesion. 4. Status post subtotal colectomy with patent anastomosis. Remaining bowel shows no wall thickening or obstruction. No abscess evident. 5. Infrarenal abdominal aortic aneurysm with a maximum transverse diameter of 4.8 x 4.8 cm. Recommend followup by abdomen and pelvis CTA in 6 months,  and vascular surgery referral/consultation if not already obtained. This recommendation follows ACR consensus guidelines: White Paper of the ACR Incidental Findings Committee II on Vascular Findings. J Am Coll Radiol 2013; 10:789-794. Extensive aortoiliac atherosclerosis. Foci of coronary artery calcification noted. 6.  Fairly sizable free-flowing right pleural effusion. 7. Incomplete visualization of apparent infiltrate in the lateral segment right middle lobe, likely pneumonia. Underlying mass in this area can not be entirely excluded. Chest CT after treatment for pneumonia is advisable to further and completely assess this area. 8. There is no renal or ureteral calculus. No hydronephrosis. There are small prostatic calculi. 9. Spinal stenosis at L2-3, L3-4, and L4-5, multifactorial in etiology. 10. Mild  left adrenal hypertrophy, a finding of questionable clinical significance. This is a stable finding compared to previous study. Aortic Atherosclerosis (ICD10-I70.0). Electronically Signed   By: Lowella Grip III M.D.   On: 09/21/2018 13:07   Scheduled Meds: . amoxicillin-clavulanate  1 tablet Oral Q12H  . carvedilol  6.25 mg Oral BID WC  . diltiazem  120 mg Oral Daily  . furosemide  20 mg Oral BID  . guaiFENesin  1,200 mg Oral BID  . Influenza vac split quadrivalent PF  0.5 mL Intramuscular Tomorrow-1000  . ipratropium-albuterol  3 mL Nebulization Q4H  . ketoconazole   Topical BID  . potassium chloride SA  40 mEq Oral BID  . predniSONE  40 mg Oral Q breakfast  . sodium chloride flush  3 mL Intravenous Q12H  . warfarin  2.5 mg Oral Once per day on Sun Tue Thu Sat  . [START ON 10/14/2018] warfarin  5 mg Oral Once per day on Mon Wed Fri  . [START ON 10/14/2018] Warfarin - Pharmacist Dosing Inpatient   Does not apply Q24H   Continuous Infusions: . sodium chloride      Principal Problem:   Atrial fibrillation with RVR (HCC) Active Problems:   Long term current use of anticoagulant therapy   Aortic stenosis   CKD (chronic kidney disease) stage 3, GFR 30-59 ml/min (HCC)   Acute on chronic combined systolic and diastolic congestive heart failure [facility])   Critical Care Time spent: 34 Mins  Irwin Brakeman, MD [facility] Pager [phone]  If 7PM-7AM, please contact night-coverage [email or web address] Password [email or web address] 10/13/2018, 9:29 AM    LOS: 2 days

## 2018-10-13 NOTE — Progress Notes (Signed)
*  PRELIMINARY RESULTS* Echocardiogram 2D Echocardiogram has been performed.  Cory Ryan 10/13/2018, 3:19 PM

## 2018-10-13 NOTE — Progress Notes (Signed)
10/13/2018 12:38 PM  Called to see patient in room due to acute change in status.  Patient developed acute respiratory distress after taking morning medications.  He likely aspirated.  He is having significant shortness of breath and wheezing.  His level of mentation is declining.  We initially tried BiPAP therapy but he is having only minimal improvement.  Want to request a stat ABG.  I will order stat Solu-Medrol IV and Lasix IV.  I have ordered for his antibiotics to be changed to IV Unasyn.  In addition, I called and spoke with daughter to verify CODE STATUS.  She reports that she is unsure of his CODE STATUS but would like for him to remain full code at this time as she consents to intubation.  Order stat ABG, stat portable chest x-ray and intubation.  Murvin Natal MD

## 2018-10-13 NOTE — Progress Notes (Signed)
Pharmacy Antibiotic Note  Cory Ryan is a 78 y.o. male admitted on 09/23/2018 with pneumonia.  Pharmacy has been consulted for Unasyn dosing.  Plan: Unasyn 3gm IV every 8 hours. Monitor labs, micro and vitals.   Height: 5\' 10"  (177.8 cm) Weight: 143 lb 11.8 oz (65.2 kg) IBW/kg (Calculated) : 73  Temp (24hrs), Avg:97.6 F (36.4 C), Min:97.1 F (36.2 C), Max:98 F (36.7 C)  Recent Labs  Lab 09/16/2018 1155 09/21/2018 1202 10/05/2018 1309 10/12/18 0331 10/13/18 0547  WBC 5.4  --   --  6.3  --   CREATININE 1.55*  --   --  1.34* 1.43*  LATICACIDVEN  --  1.98* 1.52  --   --     Estimated Creatinine Clearance: 38.6 mL/min (A) (by C-G formula based on SCr of 1.43 mg/dL (H)).    No Known Allergies  Antimicrobials this admission: Augmentin 1/2 x 1 Unasyn 1/2 >>   Dose adjustments this admission: n/a   Microbiology results:  BCx:   UCx:    Sputum:   1/2 MRSA PCR: pending  Thank you for allowing pharmacy to be a part of this patient's care.  Pricilla Larsson 10/13/2018 12:37 PM

## 2018-10-13 NOTE — Progress Notes (Signed)
Pt noted to be SOB and O2 sats in 60s. Notified RT and gave breathing treatment and placed on non-rebreather at 5L. O2 sats remained unchanged. Notified Dr. Wynetta Emery. Came to bedside and tried pt on BIPAP. O2 sats remained at 80% HR 166 AFIB restarted on Cardizem gtt at this time at 5mg . Cardiology in to see pt during this time. Despite staff's best efforts pt remained SOB and at this time it was decided to intubate pt after calling daughter and getting a verbal consent. Also gave pt 40mg  IV lasix and 125mg  IV Solumedrol prior to intubation. Pt intubated, started on proprofol gtt. OG and foley inserted. Pt tolerated well. Meanwhile pt daughter and son now at bedside and Dr. Wynetta Emery updated. ABGs performed as well as pre and post CXR.

## 2018-10-13 NOTE — Anesthesia Procedure Notes (Signed)
Procedure Name: Intubation Date/Time: 10/13/2018 12:56 PM Performed by: Vista Deck, CRNA Pre-anesthesia Checklist: Patient identified, Emergency Drugs available, Suction available, Patient being monitored and Timeout performed Patient Re-evaluated:Patient Re-evaluated prior to induction Oxygen Delivery Method: Ambu bag Preoxygenation: Pre-oxygenation with 100% oxygen Induction Type: IV induction Ventilation: Mask ventilation without difficulty Laryngoscope Size: Mac and 3 Grade View: Grade I Tube type: Subglottic suction tube Tube size: 8.0 mm Number of attempts: 1 Airway Equipment and Method: Stylet Placement Confirmation: ETT inserted through vocal cords under direct vision,  CO2 detector and breath sounds checked- equal and bilateral ETT to lip (cm): Tube secured with holder by repiratory. Tube secured with: Secured by respiratory. Dental Injury: Teeth and Oropharynx as per pre-operative assessment

## 2018-10-13 NOTE — Progress Notes (Signed)
ANTICOAGULATION CONSULT NOTE  Pharmacy Consult for Coumadin Indication: atrial fibrillation  No Known Allergies  Patient Measurements: Height: 5\' 10"  (177.8 cm) Weight: 143 lb 11.8 oz (65.2 kg) IBW/kg (Calculated) : 73  Vital Signs: Temp: 97.1 F (36.2 C) (01/02 0752) Temp Source: Axillary (01/02 0752) BP: 111/77 (01/02 0800) Pulse Rate: 86 (01/02 0800)  Labs: Recent Labs    10/10/2018 1155 10/09/2018 1559 10/08/2018 2121 10/12/18 0331 10/13/18 0547  HGB 12.4*  --   --  12.2*  --   HCT 39.7  --   --  39.2  --   PLT 59*  --   --  76*  --   LABPROT 22.5*  --   --  24.4* 27.6*  INR 2.01  --   --  2.23 2.61  CREATININE 1.55*  --   --  1.34* 1.43*  TROPONINI  --  0.21* 0.30* 0.30*  --    Estimated Creatinine Clearance: 38.6 mL/min (A) (by C-G formula based on SCr of 1.43 mg/dL (H)).  Assessment: Pt on chronic Coumadin for h/o afib. Pharmacy asked to manage.  INR on admission is therapeutic. Home dose is 2.5mg  on Tues, Thursday, Saturday, and Sunday. 5mg  on Monday, Wednesday and Friday  Goal of Therapy:  INR 2-3 Monitor platelets by anticoagulation protocol: Yes   Plan:  Warfarin 2.5mg  on Tues, Thursday, Saturday, and Sunday. 5mg  on Monday, Wednesday and Friday PT-INR daily Monitor for S/S of bleeding  Pricilla Larsson, [facility]  10/13/2018,9:09 AM

## 2018-10-13 NOTE — Progress Notes (Signed)
SLP Cancellation Note  Patient Details Name: NAVON KOTOWSKI MRN: [id number] DOB: [birth year]   Cancelled treatment:       Reason Eval/Treat Not Completed: Medical issues which prohibited therapy;Patient not medically ready; Pt is now intubated. SLP will sign off. Please reconsult when appropriate for BSE. Above discussed with RN.  Thank you,  Genene Churn, [place]    [place] 10/13/2018, 1:27 PM

## 2018-10-14 LAB — COMPREHENSIVE METABOLIC PANEL
ALT: 12 U/L (ref 0–44)
AST: 14 U/L — AB (ref 15–41)
Albumin: 3 g/dL — ABNORMAL LOW (ref 3.5–5.0)
Alkaline Phosphatase: 62 U/L (ref 38–126)
Anion gap: 12 (ref 5–15)
BUN: 37 mg/dL — AB (ref 8–23)
CO2: 21 mmol/L — ABNORMAL LOW (ref 22–32)
Calcium: 8.4 mg/dL — ABNORMAL LOW (ref 8.9–10.3)
Chloride: 106 mmol/L (ref 98–111)
Creatinine, Ser: 1.9 mg/dL — ABNORMAL HIGH (ref 0.61–1.24)
GFR calc Af Amer: 38 mL/min — ABNORMAL LOW (ref >60)
GFR calc non Af Amer: 33 mL/min — ABNORMAL LOW (ref >60)
Glucose, Bld: 171 mg/dL — ABNORMAL HIGH (ref 70–99)
Potassium: 3.8 mmol/L (ref 3.5–5.1)
Sodium: 139 mmol/L (ref 135–145)
Total Bilirubin: 1.1 mg/dL (ref 0.3–1.2)
Total Protein: 6.4 g/dL — ABNORMAL LOW (ref 6.5–8.1)

## 2018-10-14 LAB — CBC
HCT: 34.7 % — ABNORMAL LOW (ref 39.0–52.0)
Hemoglobin: 11 g/dL — ABNORMAL LOW (ref 13.0–17.0)
MCH: 30.4 pg (ref 26.0–34.0)
MCHC: 31.7 g/dL (ref 30.0–36.0)
MCV: 95.9 fL (ref 80.0–100.0)
Platelets: 65 10*3/uL — ABNORMAL LOW (ref 150–400)
RBC: 3.62 MIL/uL — ABNORMAL LOW (ref 4.22–5.81)
RDW: 13.7 % (ref 11.5–15.5)
WBC: 3 10*3/uL — ABNORMAL LOW (ref 4.0–10.5)
nRBC: 0 % (ref 0.0–0.2)

## 2018-10-14 LAB — BLOOD GAS, ARTERIAL
ACID-BASE EXCESS: 1 mmol/L (ref 0.0–2.0)
Bicarbonate: 25.6 mmol/L (ref 20.0–28.0)
DRAWN BY: 382351
FIO2: 45
MECHVT: 540 mL
O2 Saturation: 99.2 %
PEEP: 5 cmH2O
Patient temperature: 37
RATE: 16 resp/min
pCO2 arterial: 34.7 mmHg (ref 32.0–48.0)
pH, Arterial: 7.461 — ABNORMAL HIGH (ref 7.350–7.450)
pO2, Arterial: 138 mmHg — ABNORMAL HIGH (ref 83.0–108.0)

## 2018-10-14 LAB — MAGNESIUM: Magnesium: 2.3 mg/dL (ref 1.7–2.4)

## 2018-10-14 LAB — PROTIME-INR
INR: 3.86
Prothrombin Time: 37.3 seconds — ABNORMAL HIGH (ref 11.4–15.2)

## 2018-10-14 LAB — TRIGLYCERIDES: Triglycerides: 116 mg/dL (ref <150)

## 2018-10-14 MED ORDER — FENTANYL CITRATE (PF) 100 MCG/2ML IJ SOLN
50.0000 ug | INTRAMUSCULAR | Status: DC | PRN
  Administered 2018-10-14: 50 ug via INTRAVENOUS
  Filled 2018-10-14 (×2): qty 2

## 2018-10-14 MED ORDER — DILTIAZEM HCL 100 MG IV SOLR
5.0000 mg/h | INTRAVENOUS | Status: DC
  Administered 2018-10-14: 5 mg/h via INTRAVENOUS
  Administered 2018-10-15 (×3): 10 mg/h via INTRAVENOUS
  Administered 2018-10-16 (×2): 5 mg/h via INTRAVENOUS
  Filled 2018-10-14 (×5): qty 100

## 2018-10-14 MED ORDER — PRO-STAT SUGAR FREE PO LIQD
30.0000 mL | Freq: Every day | ORAL | Status: DC
  Administered 2018-10-14 – 2018-10-20 (×7): 30 mL via ORAL
  Filled 2018-10-14 (×7): qty 30

## 2018-10-14 MED ORDER — PANTOPRAZOLE SODIUM 40 MG IV SOLR
40.0000 mg | INTRAVENOUS | Status: DC
  Administered 2018-10-14 – 2018-10-20 (×7): 40 mg via INTRAVENOUS
  Filled 2018-10-14 (×7): qty 40

## 2018-10-14 MED ORDER — VITAL AF 1.2 CAL PO LIQD
1000.0000 mL | ORAL | Status: DC
  Administered 2018-10-14 – 2018-10-16 (×3): 1000 mL
  Filled 2018-10-14 (×8): qty 1000

## 2018-10-14 MED ORDER — SODIUM CHLORIDE 0.9 % IV SOLN
3.0000 g | Freq: Two times a day (BID) | INTRAVENOUS | Status: DC
  Administered 2018-10-14 – 2018-10-19 (×11): 3 g via INTRAVENOUS
  Filled 2018-10-14 (×12): qty 3

## 2018-10-14 MED ORDER — FENTANYL CITRATE (PF) 100 MCG/2ML IJ SOLN
50.0000 ug | INTRAMUSCULAR | Status: DC | PRN
  Administered 2018-10-18 – 2018-10-20 (×2): 50 ug via INTRAVENOUS
  Filled 2018-10-14 (×2): qty 2

## 2018-10-14 NOTE — Progress Notes (Signed)
Paged midlevel regarding patient running tachycardic. Appears to be pretty consistent looking through flow sheets. Was on cardizem gtt that was discontinued. Will monitor unless new orders given

## 2018-10-14 NOTE — Consult Note (Signed)
Consult requested by: [facility] hospitalist, Dr. Wynetta Emery Consult requested for: Respiratory failure  HPI: This is a 78 year old with multiple medical problems including COPD, coronary disease, atrial fib, chronic systolic heart failure and who came to the hospital with atrial fib with RVR being sent from primary care physician's office.  At that time he said he was having diarrhea increasing shortness of breath.  He was felt to have congestive heart failure exacerbation.  Echocardiogram shows worsened cardiac ejection fraction.  He had been doing somewhat better until yesterday when it appeared that he aspirated developed acute respiratory distress which culminated in him being intubated and placed on mechanical ventilation.  He remains on the ventilator.  He was felt to have pneumonia prior to this.  The history is from the medical record as he is intubated and on the ventilator and there is no family available now.  Past Medical History:  Diagnosis Date  . Anemia   . Anxiety   . Aortic stenosis    Moderate  . Arthritis   . Atrial fibrillation ([place])   . Chronic systolic heart failure (HCC)    LVEF 40-45%  . CKD (chronic kidney disease) stage 3, GFR 30-59 ml/min (HCC)   . Colon cancer ([place])   . Colon polyps 12/2005   Per colonoscopy  . COPD (chronic obstructive pulmonary disease) ([place])   . Coronary atherosclerosis of native coronary artery    Multivessel status post CABG  . Emphysema   . Gastric erosions 12/2005   Per EGD - Dr. [name]  . GERD (gastroesophageal reflux disease)   . Hypothyroidism   . Ischemic cardiomyopathy   . Mitral regurgitation    Moderate  . Myocardial infarction [facility])    Remote  . Psoriasis   . Pulmonary nodules      Family History  Problem Relation Age of Onset  . Cancer Brother   . Heart Problems Mother        unknown  . Hypertension Other      Social History   Socioeconomic History  . Marital status: Widowed    Spouse name: Not on file  . Number of  children: Not on file  . Years of education: Not on file  . Highest education level: Not on file  Occupational History  . Not on file  Social Needs  . Financial resource strain: Not on file  . Food insecurity:    Worry: Not on file    Inability: Not on file  . Transportation needs:    Medical: Not on file    Non-medical: Not on file  Tobacco Use  . Smoking status: Former Smoker    Packs/day: 1.00    Years: 30.00    Pack years: 30.00    Types: Cigarettes    Last attempt to quit: 03/17/2003    Years since quitting: 15.5  . Smokeless tobacco: Never Used  Substance and Sexual Activity  . Alcohol use: No    Alcohol/week: 0.0 standard drinks    Comment: no alcohol use since '04  . Drug use: No  . Sexual activity: Not Currently  Lifestyle  . Physical activity:    Days per week: Not on file    Minutes per session: Not on file  . Stress: Not on file  Relationships  . Social connections:    Talks on phone: Not on file    Gets together: Not on file    Attends religious service: Not on file    Active member of club  or organization: Not on file    Attends meetings of clubs or organizations: Not on file    Relationship status: Not on file  Other Topics Concern  . Not on file  Social History Narrative  . Not on file     ROS: Not obtainable    Objective: Vital signs in last 24 hours: Temp:  [97 F (36.1 C)-98.3 F (36.8 C)] 97 F (36.1 C) (01/03 0800) Pulse Rate:  [30-145] 51 (01/03 0800) Resp:  [14-35] 16 (01/03 0800) BP: (74-125)/(51-96) 96/58 (01/03 0800) SpO2:  [77 %-100 %] 100 % (01/03 0800) FiO2 (%):  [45 %-100 %] 45 % (01/03 0458) Weight:  [62.7 kg] 62.7 kg (01/03 0500) Weight change: -2.5 kg Last BM Date: 09/19/2018  Intake/Output from previous day: 01/02 0701 - 01/03 0700 In: 170.9 [I.V.:81.2; IV Piggyback:89.7] Out: 675 [Urine:675]  PHYSICAL EXAM Constitutional: He is intubated and on mechanical ventilation.  Sedated.  Eyes: Pupils react EOMI.  Ears nose  mouth and throat: Throat fairly clear with endotracheal and gastric tube in place.  Cardiovascular: He is still in atrial fib with but with well-controlled heart rate.  Respiratory: He has bilateral rhonchi.  Gastrointestinal: His abdomen is soft with no masses.  Musculoskeletal: Cannot assess.  Neurological: Cannot assess.  Psychiatric: Cannot assess  Lab Results: Basic Metabolic Panel: Recent Labs    10/13/18 0547 10/14/18 0436  NA 137 139  K 3.7 3.8  CL 103 106  CO2 25 21*  GLUCOSE 115* 171*  BUN 28* 37*  CREATININE 1.43* 1.90*  CALCIUM 8.4* 8.4*  MG 2.2 2.3   Liver Function Tests: Recent Labs    10/13/18 0547 10/14/18 0436  AST 15 14*  ALT 12 12  ALKPHOS 67 62  BILITOT 1.2 1.1  PROT 6.2* 6.4*  ALBUMIN 3.1* 3.0*   Recent Labs    09/21/2018 1155  LIPASE 31   No results for input(s): AMMONIA in the last 72 hours. CBC: Recent Labs    09/27/2018 1155 10/12/18 0331 10/14/18 0436  WBC 5.4 6.3 3.0*  NEUTROABS 4.2  --   --   HGB 12.4* 12.2* 11.0*  HCT 39.7 39.2 34.7*  MCV 96.6 95.6 95.9  PLT 59* 76* 65*   Cardiac Enzymes: Recent Labs    09/20/2018 1559 10/04/2018 2121 10/12/18 0331  TROPONINI 0.21* 0.30* 0.30*   BNP: No results for input(s): PROBNP in the last 72 hours. D-Dimer: No results for input(s): DDIMER in the last 72 hours. CBG: No results for input(s): GLUCAP in the last 72 hours. Hemoglobin A1C: No results for input(s): HGBA1C in the last 72 hours. Fasting Lipid Panel: Recent Labs    10/14/18 0726  TRIG 116   Thyroid Function Tests: No results for input(s): TSH, T4TOTAL, FREET4, T3FREE, THYROIDAB in the last 72 hours. Anemia Panel: No results for input(s): VITAMINB12, FOLATE, FERRITIN, TIBC, IRON, RETICCTPCT in the last 72 hours. Coagulation: Recent Labs    10/13/18 0547 10/14/18 0436  LABPROT 27.6* 37.3*  INR 2.61 3.86   Urine Drug Screen: Drugs of Abuse  No results found for: LABOPIA, COCAINSCRNUR, LABBENZ, AMPHETMU, THCU, LABBARB   Alcohol Level: No results for input(s): ETH in the last 72 hours. Urinalysis: Recent Labs    09/14/2018 1445  COLORURINE YELLOW  LABSPEC 1.026  PHURINE 5.0  GLUCOSEU NEGATIVE  HGBUR MODERATE*  BILIRUBINUR NEGATIVE  KETONESUR NEGATIVE  PROTEINUR NEGATIVE  NITRITE NEGATIVE  LEUKOCYTESUR NEGATIVE   Misc. Labs:   ABGS: Recent Labs    10/14/18 0505  PHART 7.461*  PO2ART 138*  HCO3 25.6     MICROBIOLOGY: Recent Results (from the past 240 hour(s))  MRSA PCR Screening     Status: None   Collection Time: 10/12/18  5:36 AM  Result Value Ref Range Status   MRSA by PCR NEGATIVE NEGATIVE Final    Comment:        The GeneXpert MRSA Assay (FDA approved for NASAL specimens only), is one component of a comprehensive MRSA colonization surveillance program. It is not intended to diagnose MRSA infection nor to guide or monitor treatment for MRSA infections. Performed at [facility], [address], [place], [place] [address]     Studies/Results: Dg Chest Port 1 View  Result Date: 10/13/2018 CLINICAL DATA:  Status post intubation EXAM: PORTABLE CHEST 1 VIEW COMPARISON:  Portable chest x-ray of earlier today at 12:41 p.m. FINDINGS: The patient has undergone interval intubation of the trachea and esophagus. The endotracheal tube tip projects approximately 4.7 cm above the carina. The esophagogastric tubes proximal port lies approximately at the GE junction. Tip lies in the gastric cardia. The cardiac silhouette is top-normal in size. The pulmonary vascularity is engorged and the interstitial markings increased. There is an azygos lobe anatomy on the right. There is no pleural effusion. 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 There is calcification in the wall of the aortic arch. IMPRESSION: CHF with interstitial edema.  No definite pneumonia. Reasonable positioning of the endotracheal tube. Advancement of the orogastric tube by 5-10 cm is recommended to assure that the proximal port lies below the  GE junction. Thoracic aortic atherosclerosis. Electronically Signed   By: David  Martinique M.D.   On: 10/13/2018 13:30   Dg Chest Port 1 View  Result Date: 10/13/2018 CLINICAL DATA:  Short of breath. EXAM: PORTABLE CHEST 1 VIEW COMPARISON:  10/13/2018 at 10:20 a.m. and older exams. FINDINGS: Since the earlier exam, bilateral hazy airspace opacities have improved. There is persistent vascular congestion and probable small effusions. Changes from CABG surgery are stable. No mediastinal or hilar masses. No pneumothorax. IMPRESSION: Improved congestive heart failure. Electronically Signed   By: Lajean Manes M.D.   On: 10/13/2018 12:52   Dg Chest Port 1 View  Result Date: 10/13/2018 CLINICAL DATA:  Congestive heart failure.  Multifocal pneumonia. EXAM: PORTABLE CHEST 1 VIEW COMPARISON:  Radiographs 08/07/2018 and 10/07/2018.  CT 09/23/2018. FINDINGS: 1020 hours. Stable cardiomegaly post median sternotomy and CABG. There are worsening diffuse bilateral airspace opacities with poor definition of the pulmonary vasculature. Confluent components are noted in the right infrahilar region, corresponding with probable pneumonia in the right middle and upper lobes on CT. There are small bilateral pleural effusions. Azygos fissure noted. IMPRESSION: Worsening bilateral airspace opacities, probably edema superimposed on right-sided pneumonia as correlated with recent CT. Electronically Signed   By: Richardean Sale M.D.   On: 10/13/2018 10:35    Medications:  Prior to Admission:  Medications Prior to Admission  Medication Sig Dispense Refill Last Dose  . carvedilol (COREG) 6.25 MG tablet TAKE (1) TABLET BY MOUTH TWICE DAILY WTIH A MEAL. (Patient taking differently: Take 6.25 mg by mouth 2 (two) times daily with a meal. TAKE (1) TABLET BY MOUTH TWICE DAILY WTIH A MEAL.) 60 tablet 6 10/07/2018 at Unknown time  . furosemide (LASIX) 20 MG tablet TAKE 20 MG EVERY OTHER DAY ALTERNATING WITH 30 MG EVERY OTHER DAY 135 tablet 1  10/01/2018 at Unknown time  . HYDROcodone-acetaminophen (NORCO) 5-325 MG per tablet Take 1-2 tablets by mouth every  4 (four) hours as needed. (Patient taking differently: Take 1 tablet by mouth every 6 (six) hours as needed for moderate pain. ) 40 tablet 0 09/23/2018 at Unknown time  . Ipratropium-Albuterol (COMBIVENT RESPIMAT) 20-100 MCG/ACT AERS respimat Inhale 1 puff into the lungs as needed. (Patient taking differently: Inhale 1 puff into the lungs 4 (four) times daily. ) 1 Inhaler 1 10/09/2018 at Unknown time  . loperamide (IMODIUM) 2 MG capsule Take 1 capsule (2 mg total) by mouth as needed for diarrhea or loose stools. (Patient taking differently: Take 4 mg by mouth daily as needed for diarrhea or loose stools. ) 20 capsule 2 09/14/2018 at Unknown time  . potassium chloride SA (K-DUR,KLOR-CON) 20 MEQ tablet Take 0.5 tablets (10 mEq total) by mouth daily. (Patient taking differently: Take 20 mEq by mouth 2 (two) times daily. ) 90 tablet 3 10/03/2018 at Unknown time  . SSD 1 % cream Apply 1 application topically 3 (three) times daily as needed. Applied to legs as needed   Taking  . albuterol (PROVENTIL) (2.5 MG/3ML) 0.083% nebulizer solution Inhale 3 mLs into the lungs every 4 (four) hours as needed for wheezing or shortness of breath.    Taking  . diltiazem (CARDIZEM CD) 120 MG 24 hr capsule TAKE (1) CAPSULE BY MOUTH TWICE DAILY. 180 capsule 0  at Unknown time  . diltiazem (CARDIZEM CD) 120 MG 24 hr capsule TAKE (1) CAPSULE BY MOUTH TWICE DAILY. 180 capsule 3   . vitamin B-12 (CYANOCOBALAMIN) 1000 MCG tablet Take 1 tablet (1,000 mcg total) by mouth daily. 60 tablet 2 Taking  . warfarin (COUMADIN) 5 MG tablet Take 0.5-1 tablets (2.5-5 mg total) by mouth See admin instructions. TAKE 1/2 TABLET (2.'5mg'$  total) BY MOUTH DAILY, EXCEPT TAKE 1 TABLET ('5mg'$  total) ON MONDAYS, WEDNESDAYS AND FRIDAYS. (Patient not taking: Reported on 09/16/2018) 75 tablet 3 Not Taking at Unknown time   Scheduled: .  chlorhexidine gluconate (MEDLINE KIT)  15 mL Mouth Rinse BID  . furosemide  40 mg Intravenous Daily  . Influenza vac split quadrivalent PF  0.5 mL Intramuscular Tomorrow-1000  . ipratropium-albuterol  3 mL Nebulization Q4H  . ketoconazole   Topical BID  . mouth rinse  15 mL Mouth Rinse 10 times per day  . methylPREDNISolone (SOLU-MEDROL) injection  40 mg Intravenous Q6H  . metoprolol tartrate  5 mg Intravenous Q6H  . pantoprazole (PROTONIX) IV  40 mg Intravenous Q24H  . sodium chloride flush  3 mL Intravenous Q12H  . warfarin  2.5 mg Oral Once per day on Sun Tue Thu Sat  . warfarin  5 mg Oral Once per day on Mon Wed Fri  . Warfarin - Pharmacist Dosing Inpatient   Does not apply Q24H   Continuous: . sodium chloride    . ampicillin-sulbactam (UNASYN) IV 3 g (10/14/18 0516)  . diltiazem (CARDIZEM) infusion Stopped (10/13/18 1857)  . propofol (DIPRIVAN) infusion 40 mcg/kg/min (10/14/18 0521)   SAY:TKZSWF chloride, albuterol, fentaNYL (SUBLIMAZE) injection, fentaNYL (SUBLIMAZE) injection, HYDROcodone-acetaminophen, LORazepam, sodium chloride flush  Assesment: He was admitted with atrial fib with RVR.  He has acute on chronic combined systolic and diastolic heart failure and seems to have aspiration pneumonia now.  He is intubated and on the ventilator and I do not see an opportunity to get him off today.  Would plan to continue with treatments including nebulizer treatments antibiotics steroids and see if he is improved.  He is critically ill.  He has multisystem failure including his cardiac situation respiratory  situation chronic kidney disease Principal Problem:   Atrial fibrillation with RVR (HCC) Active Problems:   Long term current use of anticoagulant therapy   Aortic stenosis   CKD (chronic kidney disease) stage 3, GFR 30-59 ml/min (HCC)   Acute on chronic combined systolic and diastolic congestive heart failure ([place])    Plan: As above.    LOS: 3 days   Alonza Bogus 10/14/2018, 8:27 AM

## 2018-10-14 NOTE — Progress Notes (Signed)
Received telephone order from Dr. Prudencio Burly to restart Cardizem gtt and obtain and magnesium and potassium level.

## 2018-10-14 NOTE — Progress Notes (Signed)
Pt continues to be tachycardic, not getting below 120 and as high as 157. Reached out to [place] for advice. Awaiting calls from physician.

## 2018-10-14 NOTE — Progress Notes (Signed)
Progress Note  Patient Name: Cory Ryan Date of Encounter: 10/14/2018  Primary Cardiologist: Rozann Lesches, MD   Subjective   Pt intubated, sedated  Inpatient Medications    Scheduled Meds: . chlorhexidine gluconate (MEDLINE KIT)  15 mL Mouth Rinse BID  . feeding supplement (PRO-STAT SUGAR FREE 64)  30 mL Oral Daily  . feeding supplement (VITAL AF 1.2 CAL)  1,000 mL Per Tube Q24H  . furosemide  40 mg Intravenous Daily  . Influenza vac split quadrivalent PF  0.5 mL Intramuscular Tomorrow-1000  . ipratropium-albuterol  3 mL Nebulization Q4H  . ketoconazole   Topical BID  . mouth rinse  15 mL Mouth Rinse 10 times per day  . methylPREDNISolone (SOLU-MEDROL) injection  40 mg Intravenous Q6H  . metoprolol tartrate  5 mg Intravenous Q6H  . pantoprazole (PROTONIX) IV  40 mg Intravenous Q24H  . sodium chloride flush  3 mL Intravenous Q12H  . warfarin  2.5 mg Oral Once per day on Sun Tue Thu Sat  . warfarin  5 mg Oral Once per day on Mon Wed Fri  . Warfarin - Pharmacist Dosing Inpatient   Does not apply Q24H   Continuous Infusions: . sodium chloride    . ampicillin-sulbactam (UNASYN) IV    . diltiazem (CARDIZEM) infusion Stopped (10/13/18 1857)  . propofol (DIPRIVAN) infusion 40 mcg/kg/min (10/14/18 1520)   PRN Meds: sodium chloride, albuterol, fentaNYL (SUBLIMAZE) injection, fentaNYL (SUBLIMAZE) injection, HYDROcodone-acetaminophen, LORazepam, sodium chloride flush   Vital Signs    Vitals:   10/14/18 1200 10/14/18 1427 10/14/18 1428 10/14/18 1500  BP: 98/61   (!) 98/56  Pulse: 86   (!) 109  Resp: 19   (!) 25  Temp:      TempSrc:      SpO2: 100% 100% 100% 100%  Weight:      Height:        Intake/Output Summary (Last 24 hours) at 10/14/2018 1539 Last data filed at 10/14/2018 1520 Gross per 24 hour  Intake 618.08 ml  Output 675 ml  Net -56.92 ml   Filed Weights   10/12/18 0540 10/13/18 0500 10/14/18 0500  Weight: 62.7 kg 65.2 kg 62.7 kg    Telemetry    Afib    90s to 100s  - Personally Reviewed  ECG      Physical Exam   GEN: Pt intubated, sedated Quiet Neck: No JVD Cardiac: Irreg irreg  , no murmurs, rubs, or gallops.  Respiratory: Clear to auscultation bilaterally.anteriorly   GI: Soft, nontender, non-distended  MS: No edema; No deformity. Neuro  Deferred  Labs    Chemistry Recent Labs  Lab 09/12/2018 1155 10/12/18 0331 10/13/18 0547 10/14/18 0436  NA 137 138 137 139  K 3.6 3.2* 3.7 3.8  CL 102 102 103 106  CO2 _0 21*  GLUCOSE 108* 104* 115* 171*  BUN 30* 26* 28* 37*  CREATININE 1.55* 1.34* 1.43* 1.90*  CALCIUM 8.7* 8.4* 8.4* 8.4*  PROT 6.9  --  6.2* 6.4*  ALBUMIN 3.7  --  3.1* 3.0*  AST 22  --  15 14*  ALT 13  --  12 12  ALKPHOS 71  --  67 62  BILITOT 1.5*  --  1.2 1.1  GFRNONAA 42* 50* 46* 33*  GFRAA 49* 58* 54* 38*  ANIONGAP _1 Hematology Recent Labs  Lab 10/10/2018 1155 10/12/18 0331 10/14/18 0436  WBC 5.4 6.3 3.0*  RBC 4.11* 4.10* 3.62*  HGB 12.4* 12.2* 11.0*  HCT 39.7 39.2 34.7*  MCV 96.6 95.6 95.9  MCH 30.2 29.8 30.4  MCHC 31.2 31.1 31.7  RDW 14.0 13.9 13.7  PLT 59* 76* 65*    Cardiac Enzymes Recent Labs  Lab 10/10/2018 1559 10/10/2018 2121 10/12/18 0331  TROPONINI 0.21* 0.30* 0.30*    Recent Labs  Lab 09/29/2018 1149 09/20/2018 1405  TROPIPOC 0.09* 0.12*     BNP Recent Labs  Lab 09/12/2018 1155  BNP 792.0*     DDimer No results for input(s): DDIMER in the last 168 hours.   Radiology    Dg Chest Port 1 View  Result Date: 10/13/2018 CLINICAL DATA:  Status post intubation EXAM: PORTABLE CHEST 1 VIEW COMPARISON:  Portable chest x-ray of earlier today at 12:41 p.m. FINDINGS: The patient has undergone interval intubation of the trachea and esophagus. The endotracheal tube tip projects approximately 4.7 cm above the carina. The esophagogastric tubes proximal port lies approximately at the GE junction. Tip lies in the gastric cardia. The cardiac silhouette is top-normal in size.  The pulmonary vascularity is engorged and the interstitial markings increased. There is an azygos lobe anatomy on the right. There is no pleural effusion. 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 There is calcification in the wall of the aortic arch. IMPRESSION: CHF with interstitial edema.  No definite pneumonia. Reasonable positioning of the endotracheal tube. Advancement of the orogastric tube by 5-10 cm is recommended to assure that the proximal port lies below the GE junction. Thoracic aortic atherosclerosis. Electronically Signed   By: David  Martinique M.D.   On: 10/13/2018 13:30   Dg Chest Port 1 View  Result Date: 10/13/2018 CLINICAL DATA:  Short of breath. EXAM: PORTABLE CHEST 1 VIEW COMPARISON:  10/13/2018 at 10:20 a.m. and older exams. FINDINGS: Since the earlier exam, bilateral hazy airspace opacities have improved. There is persistent vascular congestion and probable small effusions. Changes from CABG surgery are stable. No mediastinal or hilar masses. No pneumothorax. IMPRESSION: Improved congestive heart failure. Electronically Signed   By: Lajean Manes M.D.   On: 10/13/2018 12:52   Dg Chest Port 1 View  Result Date: 10/13/2018 CLINICAL DATA:  Congestive heart failure.  Multifocal pneumonia. EXAM: PORTABLE CHEST 1 VIEW COMPARISON:  Radiographs 08/07/2018 and 10/10/2018.  CT 09/17/2018. FINDINGS: 1020 hours. Stable cardiomegaly post median sternotomy and CABG. There are worsening diffuse bilateral airspace opacities with poor definition of the pulmonary vasculature. Confluent components are noted in the right infrahilar region, corresponding with probable pneumonia in the right middle and upper lobes on CT. There are small bilateral pleural effusions. Azygos fissure noted. IMPRESSION: Worsening bilateral airspace opacities, probably edema superimposed on right-sided pneumonia as correlated with recent CT. Electronically Signed   By: Richardean Sale M.D.   On: 10/13/2018 10:35    Cardiac Studies  Echo  10/13/18  ------------------------------------------------------------------- Study Conclusions  - Left ventricle: LVEF is approximately 35 to 40% with diffuse   hypokinesis; inferior akinesis. Difficult acoustic windows and   atrial fibrillation make evaluation difficult. The cavity size   was normal. Wall thickness was increased in a pattern of mild   LVH. - Aortic valve: AV may be functionally bicuspid. AV is thickened,   calcified with mildly restricted motion. - Mitral valve: There was mild regurgitation. - Left atrium: The atrium was moderately dilated. - Right ventricle: Systolic function was mildly reduced. - Right atrium: The atrium was mildly dilated.  Impressions:  - Compared to echo report from 2019 (Jan), LVEF is reduced.  Inferior wall akinesis. was present in that study.   Patient Profile     78 y.o. male   Assessment & Plan    1  Afib  Rates improved   On coumadin for anticoagulation   Need to follow closely with history of thrombocytopenia  Plt 65K today    Would use b blocker for rate control    2   CAD   Remote CABG in 2004    Echo as noted above    Down some from previous but done right after intubated     Would diurese with lasix   Watch I/O  3   Acute on chronic systolic CHF   Agree with diuresis with lasix   Follow I/O        For questions or updates, please contact [facility] Please consult [email or web address] for contact info under        Signed, Dorris Carnes, MD  10/14/2018, 3:39 PM

## 2018-10-14 NOTE — Progress Notes (Signed)
ANTICOAGULATION CONSULT NOTE  Pharmacy Consult for Coumadin Indication: atrial fibrillation  No Known Allergies  Patient Measurements: Height: 5\' 10"  (177.8 cm) Weight: 138 lb 3.7 oz (62.7 kg) IBW/kg (Calculated) : 73  Vital Signs: Temp: 97 F (36.1 C) (01/03 0800) Temp Source: Axillary (01/03 0800) BP: 97/70 (01/03 0930) Pulse Rate: 94 (01/03 0930)  Labs: Recent Labs    09/23/2018 1155 10/01/2018 1559 09/30/2018 2121 10/12/18 0331 10/13/18 0547 10/14/18 0436  HGB 12.4*  --   --  12.2*  --  11.0*  HCT 39.7  --   --  39.2  --  34.7*  PLT 59*  --   --  76*  --  65*  LABPROT 22.5*  --   --  24.4* 27.6* 37.3*  INR 2.01  --   --  2.23 2.61 3.86  CREATININE 1.55*  --   --  1.34* 1.43* 1.90*  TROPONINI  --  0.21* 0.30* 0.30*  --   --    Estimated Creatinine Clearance: 28 mL/min (A) (by C-G formula based on SCr of 1.9 mg/dL (H)).  Assessment: Pharmacy consulted to dose warfarin for this 78 yo male  on chronic warfarin for   atrial fibrillation.  Usual home dose is 2.5mg  on Tues, Thursday, Saturday, and Sunday and  5mg  on Monday, Wednesday and Friday.   INR today is 3.86 (increased by 1.25)  Goal of Therapy:  INR 2-3 Monitor platelets by anticoagulation protocol: Yes   Plan:  HOLD warfarin today. PT-INR daily Monitor for S/S of bleeding  Despina Pole, RPH  10/14/2018,10:20 AM

## 2018-10-14 NOTE — Progress Notes (Signed)
PROGRESS NOTE  Cory Ryan  KCL:[id number]  DOB: Nov 18, 1940  DOA: 09/30/2018 PCP: [name], MD   Brief Admission Hx: Cory Ryan is a 78 y.o. male with medical history significant of chronic systolic congestive heart failure, chronic kidney disease, COPD, A. fib, chronic anticoagulation on Coumadin sent in from his primary care physician's office for A. fib with RVR.    MDM/Assessment & Plan:   1. Atrial fibrillation with RVR -He is off IV diltiazem infusion and cardiology is following him and started him on IV lopressor.  HR is well controlled. BP is soft but holding.   2. Acute on chronic combined systolic and diastolic CHF - He has diuresed well.  2D echocardiogram with findings of recent change in EF down to 40% Please see full Echo report.  He is on lasix IV.   3. Elevated troponin - suspect this is demand ischemia from respiratory distress and acute CHF.  2D echocardiogram pending.  Cardiology team is following.   4. Multifocal pneumonia - Continue IV unasyn.  Continue supportive therapy.  Currently on vent.  5. Acute on chronic respiratory failure - Continue IV steroids and vent management.    6. Chronic aortic stenosis - stable. Follow.  7. Chronic anticoagulation - pharmacy assisting with warfarin dosing.  8. Stage 3 CKD - stable, following with diuresis.    DVT prophylaxis: warfarin Code Status: Full  Family Communication: daughter / son Disposition Plan: Not medically ready to discharge  Subjective: Pt is intubated on vent.   He is fully sedated.   Objective: Vitals:   10/14/18 0515 10/14/18 0530 10/14/18 0700 10/14/18 0800  BP: (!) 87/60 (!) 83/59 (!) 88/63 (!) 96/58  Pulse: (!) 38 88 95 (!) 51  Resp: '16 16 16 16  '$ Temp:    (!) 97 F (36.1 C)  TempSrc:    Axillary  SpO2: 100% 100% 100% 100%  Weight:      Height:        Intake/Output Summary (Last 24 hours) at 10/14/2018 0855 Last data filed at 10/14/2018 0558 Gross per 24 hour  Intake 170.9 ml  Output  675 ml  Net -504.1 ml   Filed Weights   10/12/18 0540 10/13/18 0500 10/14/18 0500  Weight: 62.7 kg 65.2 kg 62.7 kg    REVIEW OF SYSTEMS  UTO due to condition  Exam:  General exam: elderly male, sleepy but arousable, alert, severe seb dermatitis on face. He is sedated on vent.   Respiratory system:  Diffuse rales heard. Cardiovascular system: irregularly irregular S1 & S2 heard.  Gastrointestinal system: Abdomen is nondistended, soft and nontender. Normal bowel sounds heard. Central nervous system: Alert and oriented. No focal neurological deficits. Extremities: no cyanosis or clubbing.   Data Reviewed: Basic Metabolic Panel: Recent Labs  Lab 09/30/2018 1155 10/12/18 0331 10/13/18 0547 10/14/18 0436  NA 137 138 137 139  K 3.6 3.2* 3.7 3.8  CL 102 102 103 106  CO2 '24 25 25 '$ 21*  GLUCOSE 108* 104* 115* 171*  BUN 30* 26* 28* 37*  CREATININE 1.55* 1.34* 1.43* 1.90*  CALCIUM 8.7* 8.4* 8.4* 8.4*  MG  --  2.1 2.2 2.3   Liver Function Tests: Recent Labs  Lab 10/04/2018 1155 10/13/18 0547 10/14/18 0436  AST 22 15 14*  ALT '13 12 12  '$ ALKPHOS 71 67 62  BILITOT 1.5* 1.2 1.1  PROT 6.9 6.2* 6.4*  ALBUMIN 3.7 3.1* 3.0*   Recent Labs  Lab 09/30/2018 1155  LIPASE 31  No results for input(s): AMMONIA in the last 168 hours. CBC: Recent Labs  Lab 10/05/2018 1155 10/12/18 0331 10/14/18 0436  WBC 5.4 6.3 3.0*  NEUTROABS 4.2  --   --   HGB 12.4* 12.2* 11.0*  HCT 39.7 39.2 34.7*  MCV 96.6 95.6 95.9  PLT 59* 76* 65*   Cardiac Enzymes: Recent Labs  Lab 09/22/2018 1559 10/02/2018 2121 10/12/18 0331  TROPONINI 0.21* 0.30* 0.30*   CBG (last 3)  No results for input(s): GLUCAP in the last 72 hours. Recent Results (from the past 240 hour(s))  MRSA PCR Screening     Status: None   Collection Time: 10/12/18  5:36 AM  Result Value Ref Range Status   MRSA by PCR NEGATIVE NEGATIVE Final    Comment:        The GeneXpert MRSA Assay (FDA approved for NASAL specimens only), is one  component of a comprehensive MRSA colonization surveillance program. It is not intended to diagnose MRSA infection nor to guide or monitor treatment for MRSA infections. Performed at [facility], [address], [place], [place] [address]      Studies: Dg Chest [facility] 1 View  Result Date: 10/13/2018 CLINICAL DATA:  Status post intubation EXAM: PORTABLE CHEST 1 VIEW COMPARISON:  Portable chest x-ray of earlier today at 12:41 p.m. FINDINGS: The patient has undergone interval intubation of the trachea and esophagus. The endotracheal tube tip projects approximately 4.7 cm above the carina. The esophagogastric tubes proximal port lies approximately at the GE junction. Tip lies in the gastric cardia. The cardiac silhouette is top-normal in size. The pulmonary vascularity is engorged and the interstitial markings increased. There is an azygos lobe anatomy on the right. There is no pleural effusion. 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 There is calcification in the wall of the aortic arch. IMPRESSION: CHF with interstitial edema.  No definite pneumonia. Reasonable positioning of the endotracheal tube. Advancement of the orogastric tube by 5-10 cm is recommended to assure that the proximal port lies below the GE junction. Thoracic aortic atherosclerosis. Electronically Signed   By: David  Martinique M.D.   On: 10/13/2018 13:30   Dg Chest Port 1 View  Result Date: 10/13/2018 CLINICAL DATA:  Short of breath. EXAM: PORTABLE CHEST 1 VIEW COMPARISON:  10/13/2018 at 10:20 a.m. and older exams. FINDINGS: Since the earlier exam, bilateral hazy airspace opacities have improved. There is persistent vascular congestion and probable small effusions. Changes from CABG surgery are stable. No mediastinal or hilar masses. No pneumothorax. IMPRESSION: Improved congestive heart failure. Electronically Signed   By: Lajean Manes M.D.   On: 10/13/2018 12:52   Dg Chest Port 1 View  Result Date: 10/13/2018 CLINICAL DATA:  Congestive heart  failure.  Multifocal pneumonia. EXAM: PORTABLE CHEST 1 VIEW COMPARISON:  Radiographs 08/07/2018 and 09/11/2018.  CT 09/29/2018. FINDINGS: 1020 hours. Stable cardiomegaly post median sternotomy and CABG. There are worsening diffuse bilateral airspace opacities with poor definition of the pulmonary vasculature. Confluent components are noted in the right infrahilar region, corresponding with probable pneumonia in the right middle and upper lobes on CT. There are small bilateral pleural effusions. Azygos fissure noted. IMPRESSION: Worsening bilateral airspace opacities, probably edema superimposed on right-sided pneumonia as correlated with recent CT. Electronically Signed   By: Richardean Sale M.D.   On: 10/13/2018 10:35   Scheduled Meds: . chlorhexidine gluconate (MEDLINE KIT)  15 mL Mouth Rinse BID  . furosemide  40 mg Intravenous Daily  . Influenza vac split quadrivalent PF  0.5  mL Intramuscular Tomorrow-1000  . ipratropium-albuterol  3 mL Nebulization Q4H  . ketoconazole   Topical BID  . mouth rinse  15 mL Mouth Rinse 10 times per day  . methylPREDNISolone (SOLU-MEDROL) injection  40 mg Intravenous Q6H  . metoprolol tartrate  5 mg Intravenous Q6H  . pantoprazole (PROTONIX) IV  40 mg Intravenous Q24H  . sodium chloride flush  3 mL Intravenous Q12H  . warfarin  2.5 mg Oral Once per day on Sun Tue Thu Sat  . warfarin  5 mg Oral Once per day on Mon Wed Fri  . Warfarin - Pharmacist Dosing Inpatient   Does not apply Q24H   Continuous Infusions: . sodium chloride    . ampicillin-sulbactam (UNASYN) IV 3 g (10/14/18 0516)  . diltiazem (CARDIZEM) infusion Stopped (10/13/18 1857)  . propofol (DIPRIVAN) infusion 40 mcg/kg/min (10/14/18 0521)    Principal Problem:   Atrial fibrillation with RVR (HCC) Active Problems:   Long term current use of anticoagulant therapy   Aortic stenosis   CKD (chronic kidney disease) stage 3, GFR 30-59 ml/min (HCC)   Acute on chronic combined systolic and diastolic  congestive heart failure [facility])  Critical Care Time spent: 33 Mins  Jaramie Bastos, MD [facility]  If 7PM-7AM, please contact night-coverage [email or web address] Password [email or web address] 10/14/2018, 8:55 AM    LOS: 3 days

## 2018-10-14 NOTE — Progress Notes (Signed)
Initial Nutrition Assessment  DOCUMENTATION CODES:      INTERVENTION:  Initiate Vital 1.2 @ 40 ml/hr via OGT.    Add 30 ml Prostat q 24 hr  Tube feeding regimen + propofol provides:  1665  kcal, 87 grams of protein, and 778 ml of H2O.     NUTRITION DIAGNOSIS:   Inadequate oral intake related to inability to eat as evidenced by NPO status.    GOAL:  Provide needs based on ASPEN/SCCM guidelines  MONITOR:   Vent status, Labs, Weight trends, I & O's, TF tolerance  REASON FOR ASSESSMENT:   Consult Enteral/tube feeding initiation and management  ASSESSMENT:  Patient is currently intubated on ventilator support following an acute decline in respiratory status on 1/2. He has a hx of CHF, COPD (Emphysema), CAD, CKD-3, Colon Cancer. Patient presented on 12/31 to ED sent by PCP due to increased heart rate and acute on chronic exacerbation of CHF.   There are no family at bedside during RD visit. Discussed patient with his nurse. Based weight history review pt appears to have significant wt loss (14%) the past month. Physical exam findings are positive for mild to moderate upper body depletion and severe to lower extremities. Expect patient has some degree of malnutrition based on limited physical exam and wt history will continue to monitor and assess further as patient care progresses.  MV: 8.2  L/min Temp (24hrs), Avg:97.6 F (36.4 C), Min:97 F (36.1 C), Max:98.3 F (36.8 C)  Propofol: 15.65 ml/hr providing 413 kcal lipids q 24 hr at current rate.   Meds: lasix, prednisone, lopressor (5 mg q 6 hr), protonix   Intake/Output Summary (Last 24 hours) at 10/14/2018 1109 Last data filed at 10/14/2018 0558 Gross per 24 hour  Intake 170.9 ml  Output 675 ml  Net -504.1 ml     Labs: BMP Latest Ref Rng & Units 10/14/2018 10/13/2018 10/12/2018  Glucose 70 - 99 mg/dL 171(H) 115(H) 104(H)  BUN 8 - 23 mg/dL 37(H) 28(H) 26(H)  Creatinine 0.61 - 1.24 mg/dL 1.90(H) 1.43(H) 1.34(H)  Sodium  135 - 145 mmol/L 139 137 138  Potassium 3.5 - 5.1 mmol/L 3.8 3.7 3.2(L)  Chloride 98 - 111 mmol/L 106 103 102  CO2 22 - 32 mmol/L 21(L) 25 25  Calcium 8.9 - 10.3 mg/dL 8.4(L) 8.4(L) 8.4(L)     NUTRITION - FOCUSED PHYSICAL EXAM:    Most Recent Value  Orbital Region  Mild depletion  Thoracic and Lumbar Region  Moderate depletion  Buccal Region  Unable to assess  Temple Region  Mild depletion  Clavicle Bone Region  Moderate depletion  Clavicle and Acromion Bone Region  Moderate depletion  Scapular Bone Region  Unable to assess  Dorsal Hand  Unable to assess  Patellar Region  Severe depletion  Anterior Thigh Region  Severe depletion  Edema (RD Assessment)  None       Diet Order:   Diet Order            Diet NPO time specified  Diet effective now              EDUCATION NEEDS:   Not appropriate for education at this time Skin:  Skin Assessment: Reviewed RN Assessment(dry and flaky- RD noted bilateral lower extremities dark blue)  Last BM:  12/31  Height:   Ht Readings from Last 1 Encounters:  10/13/18 5\' 10"  (1.778 m)    Weight:   Wt Readings from Last 1 Encounters:  10/14/18 62.7 kg  Ideal Body Weight:  75.4 kg  BMI:  Body mass index is 19.83 kg/m.  Estimated Nutritional Needs:   Kcal:  8016-5537 (PSE +1.1 CF)  Protein:  88-101 gr (1.4-1.6 gr/kg/bw)  Fluid:  per MD goals   Colman Cater MS,RD,CSG,LDN Office: [phone] Pager: [phone]

## 2018-10-14 NOTE — Progress Notes (Signed)
eLink Physician-Brief Progress Note Patient Name: HIPOLITO MARTINEZLOPEZ DOB: [birth year] MRN: [id number]   Date of Service  10/14/2018  HPI/Events of Note  HR > 120, soft MAP 36.  Was on Cardizem as per RN.  Labs seen.  eICU Interventions  Resume back Cardizem. Get K/Mag level     Intervention Category Major Interventions: Arrhythmia - evaluation and management  Elmer Sow 10/14/2018, 11:02 PM

## 2018-10-14 NOTE — Care Management Important Message (Signed)
Important Message  Patient Details  Name: Cory Ryan MRN: [id number] Date of Birth: 28-Aug-1941   Medicare Important Message Given:  Yes    Gaspare, Netzel 10/14/2018, 1:04 PM

## 2018-10-14 NOTE — Progress Notes (Signed)
PT Cancellation Note  Patient Details Name: RHYS LICHTY MRN: [id number] DOB: 04-16-1941   Cancelled Treatment:    Reason Eval/Treat Not Completed: Medical issues which prohibited therapy.  Patient has had a decline in medication condition and put on vent, will need new orders to attempt physical therapy when patient is medically stable.  Thank you.   3:36 PM, 10/14/18 Lonell Grandchild, MPT Physical Therapist with [facility] [phone] office [phone] mobile phone

## 2018-10-15 ENCOUNTER — Inpatient Hospital Stay (HOSPITAL_COMMUNITY): Payer: Medicare Other

## 2018-10-15 LAB — MAGNESIUM: Magnesium: 2.6 mg/dL — ABNORMAL HIGH (ref 1.7–2.4)

## 2018-10-15 LAB — GLUCOSE, CAPILLARY
GLUCOSE-CAPILLARY: 140 mg/dL — AB (ref 70–99)
Glucose-Capillary: 259 mg/dL — ABNORMAL HIGH (ref 70–99)
Glucose-Capillary: 219 mg/dL — ABNORMAL HIGH (ref 70–99)
Glucose-Capillary: 230 mg/dL — ABNORMAL HIGH (ref 70–99)

## 2018-10-15 LAB — BASIC METABOLIC PANEL
Anion gap: 11 (ref 5–15)
BUN: 57 mg/dL — ABNORMAL HIGH (ref 8–23)
CO2: 21 mmol/L — AB (ref 22–32)
Calcium: 7.9 mg/dL — ABNORMAL LOW (ref 8.9–10.3)
Chloride: 105 mmol/L (ref 98–111)
Creatinine, Ser: 2.31 mg/dL — ABNORMAL HIGH (ref 0.61–1.24)
GFR calc Af Amer: 30 mL/min — ABNORMAL LOW (ref >60)
GFR calc non Af Amer: 26 mL/min — ABNORMAL LOW (ref >60)
Glucose, Bld: 246 mg/dL — ABNORMAL HIGH (ref 70–99)
Potassium: 3.8 mmol/L (ref 3.5–5.1)
Sodium: 137 mmol/L (ref 135–145)

## 2018-10-15 LAB — BLOOD GAS, ARTERIAL
Acid-Base Excess: 0.4 mmol/L (ref 0.0–2.0)
Bicarbonate: 24.9 mmol/L (ref 20.0–28.0)
Drawn by: 22223
FIO2: 35
MECHVT: 540 mL
O2 Saturation: 98.4 %
PEEP: 5 cmH2O
PH ART: 7.423 (ref 7.350–7.450)
Patient temperature: 37
RATE: 16 resp/min
pCO2 arterial: 38 mmHg (ref 32.0–48.0)
pO2, Arterial: 121 mmHg — ABNORMAL HIGH (ref 83.0–108.0)

## 2018-10-15 LAB — POTASSIUM: Potassium: 3.7 mmol/L (ref 3.5–5.1)

## 2018-10-15 LAB — PROTIME-INR
INR: 4.98
PROTHROMBIN TIME: 45.4 s — AB (ref 11.4–15.2)

## 2018-10-15 LAB — BRAIN NATRIURETIC PEPTIDE: B Natriuretic Peptide: 373 pg/mL — ABNORMAL HIGH (ref 0.0–100.0)

## 2018-10-15 MED ORDER — SODIUM CHLORIDE 0.9 % IV SOLN: INTRAVENOUS | Status: DC

## 2018-10-15 MED ORDER — IPRATROPIUM BROMIDE 0.02 % IN SOLN
0.5000 mg | Freq: Four times a day (QID) | RESPIRATORY_TRACT | Status: DC
  Administered 2018-10-15 – 2018-10-20 (×21): 0.5 mg via RESPIRATORY_TRACT
  Filled 2018-10-15 (×21): qty 2.5

## 2018-10-15 MED ORDER — LEVALBUTEROL HCL 0.63 MG/3ML IN NEBU: 0.6300 mg | INHALATION_SOLUTION | Freq: Four times a day (QID) | RESPIRATORY_TRACT | Status: DC | PRN

## 2018-10-15 MED ORDER — LEVALBUTEROL HCL 0.63 MG/3ML IN NEBU
0.6300 mg | INHALATION_SOLUTION | Freq: Four times a day (QID) | RESPIRATORY_TRACT | Status: DC
  Administered 2018-10-15 – 2018-10-20 (×21): 0.63 mg via RESPIRATORY_TRACT
  Filled 2018-10-15 (×21): qty 3

## 2018-10-15 MED ORDER — INSULIN ASPART 100 UNIT/ML ~~LOC~~ SOLN
0.0000 [IU] | SUBCUTANEOUS | Status: DC
  Administered 2018-10-15: 5 [IU] via SUBCUTANEOUS
  Administered 2018-10-15 (×2): 3 [IU] via SUBCUTANEOUS
  Administered 2018-10-15: 1 [IU] via SUBCUTANEOUS
  Administered 2018-10-16: 2 [IU] via SUBCUTANEOUS
  Administered 2018-10-16 (×2): 3 [IU] via SUBCUTANEOUS
  Administered 2018-10-16: 1 [IU] via SUBCUTANEOUS
  Administered 2018-10-16: 3 [IU] via SUBCUTANEOUS
  Administered 2018-10-17: 2 [IU] via SUBCUTANEOUS
  Administered 2018-10-17: 3 [IU] via SUBCUTANEOUS
  Administered 2018-10-17 – 2018-10-18 (×9): 2 [IU] via SUBCUTANEOUS
  Administered 2018-10-19 (×6): 3 [IU] via SUBCUTANEOUS
  Administered 2018-10-20 (×3): 2 [IU] via SUBCUTANEOUS

## 2018-10-15 MED ORDER — FUROSEMIDE 10 MG/ML IJ SOLN: 40.0000 mg | Freq: Two times a day (BID) | INTRAMUSCULAR | Status: DC

## 2018-10-15 MED ORDER — INSULIN GLARGINE 100 UNIT/ML ~~LOC~~ SOLN
12.0000 [IU] | Freq: Every day | SUBCUTANEOUS | Status: DC
  Administered 2018-10-15 – 2018-10-19 (×5): 12 [IU] via SUBCUTANEOUS
  Filled 2018-10-15 (×6): qty 0.12

## 2018-10-15 MED ORDER — FUROSEMIDE 10 MG/ML IJ SOLN
40.0000 mg | Freq: Every day | INTRAMUSCULAR | Status: DC
  Administered 2018-10-15: 40 mg via INTRAVENOUS
  Filled 2018-10-15: qty 4

## 2018-10-15 MED ORDER — SODIUM CHLORIDE 0.9 % IV BOLUS: 250.0000 mL | Freq: Once | INTRAVENOUS | Status: AC

## 2018-10-15 MED ORDER — SODIUM CHLORIDE 0.9 % IV BOLUS
250.0000 mL | Freq: Once | INTRAVENOUS | Status: AC
  Administered 2018-10-15: 250 mL via INTRAVENOUS

## 2018-10-15 NOTE — Progress Notes (Signed)
ANTICOAGULATION CONSULT NOTE  Pharmacy Consult for Coumadin Indication: atrial fibrillation  No Known Allergies  Patient Measurements: Height: 5\' 10"  (177.8 cm) Weight: 140 lb 6.9 oz (63.7 kg) IBW/kg (Calculated) : 73  Vital Signs: Temp: 99.1 F (37.3 C) (01/04 1129) Temp Source: Axillary (01/04 1129) BP: 104/74 (01/04 1100) Pulse Rate: 115 (01/04 1129)  Labs: Recent Labs    10/13/18 0547 10/14/18 0436 10/15/18 0346  HGB  --  11.0*  --   HCT  --  34.7*  --   PLT  --  65*  --   LABPROT 27.6* 37.3* 45.4*  INR 2.61 3.86 4.98*  CREATININE 1.43* 1.90* 2.31*   Estimated Creatinine Clearance: 23.4 mL/min (A) (by C-G formula based on SCr of 2.31 mg/dL (H)).  Assessment: Pharmacy consulted to dose warfarin for this 78 yo male  on chronic warfarin for   atrial fibrillation.    INR today is  4.98, so will hold warfarin again today.   Goal of Therapy:  INR 2-3 Monitor platelets by anticoagulation protocol: Yes   Plan:  HOLD warfarin today. PT-INR daily Monitor for S/S of bleeding  Despina Pole, [facility]  10/15/2018,12:02 PM

## 2018-10-15 NOTE — Progress Notes (Addendum)
PROGRESS NOTE  Cory Ryan  WOE:[id number]  DOB: 1941-02-24  DOA: 09/17/2018 PCP: [name], MD   Brief Admission Hx: Cory Ryan is a 78 y.o. male with medical history significant of chronic systolic congestive heart failure, chronic kidney disease, COPD, A. fib, chronic anticoagulation on Coumadin sent in from his primary care physician's office for A. fib with RVR.    MDM/Assessment & Plan:   1. Atrial fibrillation with RVR -He had to be restarted on IV dilitiazem overnight due to uncontrolled HR.  He remains on IV lopressor.  HR is better controlled but BP remains soft.    2. Acute on chronic combined systolic and diastolic CHF - He continues to have pulmonary edema on CXR 2D echocardiogram with findings of recent change in EF down to 40% Please see full Echo report.  His IV lasix increased to 40 mg BID.   3. Elevated troponin - suspect this is demand ischemia from respiratory distress and acute CHF.  Cardiology team is following.   4. Multifocal pneumonia - Continue IV unasyn.  Continue supportive therapy.  Currently on vent.   5. Acute on chronic respiratory failure - Continue IV steroids and vent management and scheduled nebs.    6. Chronic aortic stenosis - stable. Follow.   7. Chronic anticoagulation - pharmacy assisting with warfarin dosing.  8. Stage 3 CKD - creatinine bumping with IVFs, adding gentle hydration with NS.  Follow creatinine.     DVT prophylaxis: warfarin Code Status: Full  Family Communication: daughter / son Disposition Plan: Not medically ready to discharge  Antibiotics  Unasyn 1/3 >   Subjective: Pt is intubated on vent.   He is fully sedated.   Objective: Vitals:   10/15/18 0615 10/15/18 0630 10/15/18 0639 10/15/18 0645  BP: 97/61 (!) 93/50  (!) 95/52  Pulse: 99 96  99  Resp: _0 Temp:      TempSrc:      SpO2: 100% 100% 100% 100%  Weight:      Height:        Intake/Output Summary (Last 24 hours) at 10/15/2018 0656 Last data  filed at 10/15/2018 0600 Gross per 24 hour  Intake 1112.77 ml  Output 700 ml  Net 412.77 ml   Filed Weights   10/13/18 0500 10/14/18 0500 10/15/18 0423  Weight: 65.2 kg 62.7 kg 63.7 kg    REVIEW OF SYSTEMS  UTO due to condition  Exam:  General exam: elderly male, sleepy but arousable, alert, severe seb dermatitis on face. He is sedated on vent.   Respiratory system:  Diffuse rales heard. Bibasilar crackles persist.  Cardiovascular system: irregularly irregular S1 & S2 heard.  Gastrointestinal system: Abdomen is nondistended, soft and nontender. Normal bowel sounds heard. Central nervous system: sedated on vent. No focal neurological deficits. Extremities: no cyanosis or clubbing.   Data Reviewed: Basic Metabolic Panel: Recent Labs  Lab 09/12/2018 1155 10/12/18 0331 10/13/18 0547 10/14/18 0436 10/14/18 2334  NA 137 138 137 139  --   K 3.6 3.2* 3.7 3.8 3.7  CL 102 102 103 106  --   CO2 _1 21*  --   GLUCOSE 108* 104* 115* 171*  --   BUN 30* 26* 28* 37*  --   CREATININE 1.55* 1.34* 1.43* 1.90*  --   CALCIUM 8.7* 8.4* 8.4* 8.4*  --   MG  --  2.1 2.2 2.3 2.6*   Liver Function Tests: Recent Labs  Lab 09/30/2018 1155  10/13/18 0547 10/14/18 0436  AST 22 15 14*  ALT _0 ALKPHOS 71 67 62  BILITOT 1.5* 1.2 1.1  PROT 6.9 6.2* 6.4*  ALBUMIN 3.7 3.1* 3.0*   Recent Labs  Lab 09/23/2018 1155  LIPASE 31   No results for input(s): AMMONIA in the last 168 hours. CBC: Recent Labs  Lab 09/16/2018 1155 10/12/18 0331 10/14/18 0436  WBC 5.4 6.3 3.0*  NEUTROABS 4.2  --   --   HGB 12.4* 12.2* 11.0*  HCT 39.7 39.2 34.7*  MCV 96.6 95.6 95.9  PLT 59* 76* 65*   Cardiac Enzymes: Recent Labs  Lab 10/06/2018 1559 09/12/2018 2121 10/12/18 0331  TROPONINI 0.21* 0.30* 0.30*   CBG (last 3)  No results for input(s): GLUCAP in the last 72 hours. Recent Results (from the past 240 hour(s))  MRSA PCR Screening     Status: None   Collection Time: 10/12/18  5:36 AM  Result  Value Ref Range Status   MRSA by PCR NEGATIVE NEGATIVE Final    Comment:        The GeneXpert MRSA Assay (FDA approved for NASAL specimens only), is one component of a comprehensive MRSA colonization surveillance program. It is not intended to diagnose MRSA infection nor to guide or monitor treatment for MRSA infections. Performed at [facility], [address], [place], [place] [address]      Studies: Dg Chest [facility] 1 View  Result Date: 10/15/2018 CLINICAL DATA:  Initial evaluation for acute respiratory failure, fluid overload. EXAM: PORTABLE CHEST 1 VIEW COMPARISON:  Prior radiograph from 10/13/2018. FINDINGS: Endotracheal tube in place with tip position 5.7 cm above the carina. Enteric tube courses into the abdomen. Median sternotomy wires with underlying CABG markers noted. Stable cardiomegaly. Mediastinal silhouette normal. Lungs mildly hypoinflated. Persistent interstitial vascular congestion with small bilateral pleural effusions. Superimposed hazy bibasilar opacities favored to reflect edema and/or atelectasis, although infiltrates would be difficult to exclude. No pneumothorax. No acute osseous abnormality. IMPRESSION: 1. Support apparatus in satisfactory position. 2. Persistent mild diffuse pulmonary interstitial congestion/edema with small bilateral pleural effusions. 3. Superimposed hazy bibasilar opacities, favored to reflect atelectasis and/or edema. Superimposed infiltrates could be considered in the correct clinical setting. Electronically Signed   By: Jeannine Boga M.D.   On: 10/15/2018 03:56   Dg Chest Port 1 View  Result Date: 10/13/2018 CLINICAL DATA:  Status post intubation EXAM: PORTABLE CHEST 1 VIEW COMPARISON:  Portable chest x-ray of earlier today at 12:41 p.m. FINDINGS: The patient has undergone interval intubation of the trachea and esophagus. The endotracheal tube tip projects approximately 4.7 cm above the carina. The esophagogastric tubes proximal port lies  approximately at the GE junction. Tip lies in the gastric cardia. The cardiac silhouette is top-normal in size. The pulmonary vascularity is engorged and the interstitial markings increased. There is an azygos lobe anatomy on the right. There is no pleural effusion. 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 There is calcification in the wall of the aortic arch. IMPRESSION: CHF with interstitial edema.  No definite pneumonia. Reasonable positioning of the endotracheal tube. Advancement of the orogastric tube by 5-10 cm is recommended to assure that the proximal port lies below the GE junction. Thoracic aortic atherosclerosis. Electronically Signed   By: David  Martinique M.D.   On: 10/13/2018 13:30   Dg Chest Port 1 View  Result Date: 10/13/2018 CLINICAL DATA:  Short of breath. EXAM: PORTABLE CHEST 1 VIEW COMPARISON:  10/13/2018 at 10:20 a.m. and older exams. FINDINGS: Since  the earlier exam, bilateral hazy airspace opacities have improved. There is persistent vascular congestion and probable small effusions. Changes from CABG surgery are stable. No mediastinal or hilar masses. No pneumothorax. IMPRESSION: Improved congestive heart failure. Electronically Signed   By: Lajean Manes M.D.   On: 10/13/2018 12:52   Dg Chest Port 1 View  Result Date: 10/13/2018 CLINICAL DATA:  Congestive heart failure.  Multifocal pneumonia. EXAM: PORTABLE CHEST 1 VIEW COMPARISON:  Radiographs 08/07/2018 and 09/27/2018.  CT 10/01/2018. FINDINGS: 1020 hours. Stable cardiomegaly post median sternotomy and CABG. There are worsening diffuse bilateral airspace opacities with poor definition of the pulmonary vasculature. Confluent components are noted in the right infrahilar region, corresponding with probable pneumonia in the right middle and upper lobes on CT. There are small bilateral pleural effusions. Azygos fissure noted. IMPRESSION: Worsening bilateral airspace opacities, probably edema superimposed on right-sided pneumonia as correlated with  recent CT. Electronically Signed   By: Richardean Sale M.D.   On: 10/13/2018 10:35   Scheduled Meds: . chlorhexidine gluconate (MEDLINE KIT)  15 mL Mouth Rinse BID  . feeding supplement (PRO-STAT SUGAR FREE 64)  30 mL Oral Daily  . feeding supplement (VITAL AF 1.2 CAL)  1,000 mL Per Tube Q24H  . furosemide  40 mg Intravenous BID  . Influenza vac split quadrivalent PF  0.5 mL Intramuscular Tomorrow-1000  . ipratropium  0.5 mg Nebulization Q6H  . ketoconazole   Topical BID  . levalbuterol  0.63 mg Nebulization Q6H  . mouth rinse  15 mL Mouth Rinse 10 times per day  . methylPREDNISolone (SOLU-MEDROL) injection  40 mg Intravenous Q6H  . metoprolol tartrate  5 mg Intravenous Q6H  . pantoprazole (PROTONIX) IV  40 mg Intravenous Q24H  . sodium chloride flush  3 mL Intravenous Q12H  . warfarin  2.5 mg Oral Once per day on Sun Tue Thu Sat  . warfarin  5 mg Oral Once per day on Mon Wed Fri  . Warfarin - Pharmacist Dosing Inpatient   Does not apply Q24H   Continuous Infusions: . sodium chloride    . ampicillin-sulbactam (UNASYN) IV Stopped (10/15/18 0517)  . diltiazem (CARDIZEM) infusion 10 mg/hr (10/15/18 0600)  . propofol (DIPRIVAN) infusion 40.005 mcg/kg/min (10/15/18 0600)    Principal Problem:   Atrial fibrillation with RVR (HCC) Active Problems:   Long term current use of anticoagulant therapy   Aortic stenosis   CKD (chronic kidney disease) stage 3, GFR 30-59 ml/min (HCC)   Acute on chronic combined systolic and diastolic congestive heart failure [facility])  Critical Care Time spent: 31 Mins  Saman Umstead, MD [facility]  If 7PM-7AM, please contact night-coverage [email or web address] Password [email or web address] 10/15/2018, 6:56 AM    LOS: 4 days

## 2018-10-15 NOTE — Progress Notes (Signed)
Subjective: He remains intubated and on the ventilator.  He had atrial fib with RVR again last night and he is back on diltiazem drip his blood pressure is soft on diltiazem.  Objective: Vital signs in last 24 hours: Temp:  [97.2 F (36.2 C)-98.8 F (37.1 C)] 98.2 F (36.8 C) (01/04 0729) Pulse Rate:  [29-128] 67 (01/04 0729) Resp:  [14-39] 16 (01/04 0729) BP: (68-116)/(48-89) 95/52 (01/04 0645) SpO2:  [99 %-100 %] 100 % (01/04 0729) FiO2 (%):  [35 %] 35 % (01/04 0642) Weight:  [63.7 kg] 63.7 kg (01/04 0423) Weight change: 1 kg Last BM Date: 09/12/2018  Intake/Output from previous day: 01/03 0701 - 01/04 0700 In: 1112.8 [I.V.:656.1; NG/GT:162.7; IV Piggyback:294] Out: 700 [Urine:700]  PHYSICAL EXAM General appearance: Intubated sedated on mechanical ventilation Resp: rhonchi bilaterally Cardio: irregularly irregular rhythm GI: soft, non-tender; bowel sounds normal; no masses,  no organomegaly Extremities: He has dark discoloration of both legs below the knees which is chronic  Lab Results:  Results for orders placed or performed during the hospital encounter of 09/20/2018 (from the past 48 hour(s))  Blood gas, arterial     Status: Abnormal   Collection Time: 10/13/18 12:40 PM  Result Value Ref Range   FIO2 100.00    Delivery systems BILEVEL POSITIVE AIRWAY PRESSURE    Mode BILEVEL POSITIVE AIRWAY PRESSURE    pH, Arterial 7.396 7.350 - 7.450   pCO2 arterial 37.0 32.0 - 48.0 mmHg   pO2, Arterial 44.0 (L) 83.0 - 108.0 mmHg   Bicarbonate 22.5 20.0 - 28.0 mmol/L   Acid-base deficit 1.8 0.0 - 2.0 mmol/L   O2 Saturation 76.5 %   Patient temperature 36.4    Collection site LEFT BRACHIAL    Drawn by COLLECTED BY RT    Sample type ARTERIAL DRAW     Comment: Performed at [facility], [address] [place], [place] [address]  Triglycerides     Status: None   Collection Time: 10/13/18  2:48 PM  Result Value Ref Range   Triglycerides 89 <150 mg/dL    Comment: Performed at [facility], [address] [place], [place] [address]  Blood gas, arterial     Status: Abnormal   Collection Time: 10/13/18  3:17 PM  Result Value Ref Range   FIO2 1.00    pH, Arterial 7.512 (H) 7.350 - 7.450   pCO2 arterial 31.3 (L) 32.0 - 48.0 mmHg   pO2, Arterial 291 (H) 83.0 - 108.0 mmHg   Bicarbonate 26.6 20.0 - 28.0 mmol/L   Acid-Base Excess 2.0 0.0 - 2.0 mmol/L   O2 Saturation 100.0 %   Patient temperature 37.0    Allens test (pass/fail) BRACHIAL ARTERY (A) PASS    Comment: Performed at [facility], [address], [place], [place] [address]  Protime-INR     Status: Abnormal   Collection Time: 10/14/18  4:36 AM  Result Value Ref Range   Prothrombin Time 37.3 (H) 11.4 - 15.2 seconds   INR 3.86     Comment: Performed at [facility], [address], [place], [place] [address]  CBC     Status: Abnormal   Collection Time: 10/14/18  4:36 AM  Result Value Ref Range   WBC 3.0 (L) 4.0 - 10.5 K/uL   RBC 3.62 (L) 4.22 - 5.81 MIL/uL   Hemoglobin 11.0 (L) 13.0 - 17.0 g/dL   HCT 34.7 (L) 39.0 - 52.0 %   MCV 95.9 80.0 - 100.0 fL   MCH 30.4 26.0 -  34.0 pg   MCHC 31.7 30.0 - 36.0 g/dL   RDW 13.7 11.5 - 15.5 %   Platelets 65 (L) 150 - 400 K/uL    Comment: SPECIMEN CHECKED FOR CLOTS Immature Platelet Fraction may be clinically indicated, consider ordering this additional test [id number] CONSISTENT WITH PREVIOUS RESULT    nRBC 0.0 0.0 - 0.2 %    Comment: Performed at [facility], [address] [place], [place] [address]  Comprehensive metabolic panel     Status: Abnormal   Collection Time: 10/14/18  4:36 AM  Result Value Ref Range   Sodium 139 135 - 145 mmol/L   Potassium 3.8 3.5 - 5.1 mmol/L   Chloride 106 98 - 111 mmol/L   CO2 21 (L) 22 - 32 mmol/L   Glucose, Bld 171 (H) 70 - 99 mg/dL   BUN 37 (H) 8 - 23 mg/dL   Creatinine, Ser 1.90 (H) 0.61 - 1.24 mg/dL   Calcium 8.4 (L) 8.9 - 10.3 mg/dL   Total Protein 6.4 (L) 6.5 - 8.1 g/dL   Albumin 3.0 (L) 3.5 - 5.0 g/dL   AST 14 (L) 15 -  41 U/L   ALT 12 0 - 44 U/L   Alkaline Phosphatase 62 38 - 126 U/L   Total Bilirubin 1.1 0.3 - 1.2 mg/dL   GFR calc non Af Amer 33 (L) >60 mL/min   GFR calc Af Amer 38 (L) >60 mL/min   Anion gap 12 5 - 15    Comment: Performed at [facility], [address] [place], [place] [address]  Magnesium     Status: None   Collection Time: 10/14/18  4:36 AM  Result Value Ref Range   Magnesium 2.3 1.7 - 2.4 mg/dL    Comment: Performed at [facility], [address] [place], [place] [address]  Blood gas, arterial     Status: Abnormal   Collection Time: 10/14/18  5:05 AM  Result Value Ref Range   FIO2 45.00    Delivery systems VENTILATOR    Mode PRESSURE REGULATED VOLUME CONTROL    VT 540 mL   LHR 16 resp/min   Peep/cpap 5.0 cm H20   pH, Arterial 7.461 (H) 7.350 - 7.450   pCO2 arterial 34.7 32.0 - 48.0 mmHg   pO2, Arterial 138 (H) 83.0 - 108.0 mmHg   Bicarbonate 25.6 20.0 - 28.0 mmol/L   Acid-Base Excess 1.0 0.0 - 2.0 mmol/L   O2 Saturation 99.2 %   Patient temperature 37.0    Collection site BRACHIAL ARTERY    Drawn by [id number]    Sample type ARTERIAL DRAW    Allens test (pass/fail) BRACHIAL ARTERY (A) PASS    Comment: Performed at [facility], [address], [place], [place] [address]  Triglycerides     Status: None   Collection Time: 10/14/18  7:26 AM  Result Value Ref Range   Triglycerides 116 <150 mg/dL    Comment: Performed at [facility], [address] [place],  [address]  Potassium     Status: None   Collection Time: 10/14/18 11:34 PM  Result Value Ref Range   Potassium 3.7 3.5 - 5.1 mmol/L    Comment: Performed at [facility], [address], [place],  [address]  Magnesium     Status: Abnormal   Collection Time: 10/14/18 11:34 PM  Result Value Ref Range   Magnesium 2.6 (H) 1.7 - 2.4 mg/dL    Comment: Performed at [facility], [address], [place], [place]  [address]  Blood gas, arterial     Status: Abnormal   Collection Time: 10/15/18  3:25 AM  Result  Value Ref Range   FIO2 35.00    Delivery systems VENTILATOR    Mode PRESSURE REGULATED VOLUME CONTROL    VT 540 mL   LHR 16 resp/min   Peep/cpap 5.0 cm H20   pH, Arterial 7.423 7.350 - 7.450   pCO2 arterial 38.0 32.0 - 48.0 mmHg   pO2, Arterial 121 (H) 83.0 - 108.0 mmHg   Bicarbonate 24.9 20.0 - 28.0 mmol/L   Acid-Base Excess 0.4 0.0 - 2.0 mmol/L   O2 Saturation 98.4 %   Patient temperature 37.0    Collection site RIGHT BRACHIAL    Drawn by [id number]    Sample type ARTHROGRAPHIS SPECIES    Allens test (pass/fail) PASS PASS    Comment: Performed at [facility], [address], [place], [place] [address]  Protime-INR     Status: Abnormal   Collection Time: 10/15/18  3:46 AM  Result Value Ref Range   Prothrombin Time 45.4 (H) 11.4 - 15.2 seconds   INR 4.98 (HH)     Comment: RESULT REPEATED AND VERIFIED CRITICAL RESULT CALLED TO, READ BACK BY AND VERIFIED WITH: WADE,S @ 0446 ON 10/15/18 BY JUW Performed at [facility], [address], [place], [place] [address]   Brain natriuretic peptide     Status: Abnormal   Collection Time: 10/15/18  3:46 AM  Result Value Ref Range   B Natriuretic Peptide 373.0 (H) 0.0 - 100.0 pg/mL    Comment: Performed at [facility], [address], [place], [place] [address]  Basic metabolic panel     Status: Abnormal   Collection Time: 10/15/18  3:46 AM  Result Value Ref Range   Sodium 137 135 - 145 mmol/L   Potassium 3.8 3.5 - 5.1 mmol/L   Chloride 105 98 - 111 mmol/L   CO2 21 (L) 22 - 32 mmol/L   Glucose, Bld 246 (H) 70 - 99 mg/dL   BUN 57 (H) 8 - 23 mg/dL   Creatinine, Ser 2.31 (H) 0.61 - 1.24 mg/dL   Calcium 7.9 (L) 8.9 - 10.3 mg/dL   GFR calc non Af Amer 26 (L) >60 mL/min   GFR calc Af Amer 30 (L) >60 mL/min   Anion gap 11 5 - 15    Comment: Performed at [facility], [address], [place], [place] [address]  Glucose, capillary     Status: Abnormal   Collection Time: 10/15/18  7:28 AM  Result Value Ref Range   Glucose-Capillary 259 (H) 70 - 99  mg/dL    ABGS Recent Labs    10/15/18 0325  PHART 7.423  PO2ART 121*  HCO3 24.9   CULTURES Recent Results (from the past 240 hour(s))  MRSA PCR Screening     Status: None   Collection Time: 10/12/18  5:36 AM  Result Value Ref Range Status   MRSA by PCR NEGATIVE NEGATIVE Final    Comment:        The GeneXpert MRSA Assay (FDA approved for NASAL specimens only), is one component of a comprehensive MRSA colonization surveillance program. It is not intended to diagnose MRSA infection nor to guide or monitor treatment for MRSA infections. Performed at [facility], [address] [place], [place] [address]    Studies/Results: Dg Chest Port 1 View  Result Date: 10/15/2018 CLINICAL DATA:  Initial evaluation for acute respiratory failure, fluid overload. EXAM: PORTABLE CHEST 1  VIEW COMPARISON:  Prior radiograph from 10/13/2018. FINDINGS: Endotracheal tube in place with tip position 5.7 cm above the carina. Enteric tube courses into the abdomen. Median sternotomy wires with underlying CABG markers noted. Stable cardiomegaly. Mediastinal silhouette normal. Lungs mildly hypoinflated. Persistent interstitial vascular congestion with small bilateral pleural effusions. Superimposed hazy bibasilar opacities favored to reflect edema and/or atelectasis, although infiltrates would be difficult to exclude. No pneumothorax. No acute osseous abnormality. IMPRESSION: 1. Support apparatus in satisfactory position. 2. Persistent mild diffuse pulmonary interstitial congestion/edema with small bilateral pleural effusions. 3. Superimposed hazy bibasilar opacities, favored to reflect atelectasis and/or edema. Superimposed infiltrates could be considered in the correct clinical setting. Electronically Signed   By: Jeannine Boga M.D.   On: 10/15/2018 03:56   Dg Chest Port 1 View  Result Date: 10/13/2018 CLINICAL DATA:  Status post intubation EXAM: PORTABLE CHEST 1 VIEW COMPARISON:  Portable chest x-ray of  earlier today at 12:41 p.m. FINDINGS: The patient has undergone interval intubation of the trachea and esophagus. The endotracheal tube tip projects approximately 4.7 cm above the carina. The esophagogastric tubes proximal port lies approximately at the GE junction. Tip lies in the gastric cardia. The cardiac silhouette is top-normal in size. The pulmonary vascularity is engorged and the interstitial markings increased. There is an azygos lobe anatomy on the right. There is no pleural effusion. 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 There is calcification in the wall of the aortic arch. IMPRESSION: CHF with interstitial edema.  No definite pneumonia. Reasonable positioning of the endotracheal tube. Advancement of the orogastric tube by 5-10 cm is recommended to assure that the proximal port lies below the GE junction. Thoracic aortic atherosclerosis. Electronically Signed   By: David  Martinique M.D.   On: 10/13/2018 13:30   Dg Chest Port 1 View  Result Date: 10/13/2018 CLINICAL DATA:  Short of breath. EXAM: PORTABLE CHEST 1 VIEW COMPARISON:  10/13/2018 at 10:20 a.m. and older exams. FINDINGS: Since the earlier exam, bilateral hazy airspace opacities have improved. There is persistent vascular congestion and probable small effusions. Changes from CABG surgery are stable. No mediastinal or hilar masses. No pneumothorax. IMPRESSION: Improved congestive heart failure. Electronically Signed   By: Lajean Manes M.D.   On: 10/13/2018 12:52    Medications:  Prior to Admission:  Medications Prior to Admission  Medication Sig Dispense Refill Last Dose  . carvedilol (COREG) 6.25 MG tablet TAKE (1) TABLET BY MOUTH TWICE DAILY WTIH A MEAL. (Patient taking differently: Take 6.25 mg by mouth 2 (two) times daily with a meal. TAKE (1) TABLET BY MOUTH TWICE DAILY WTIH A MEAL.) 60 tablet 6 10/08/2018 at Unknown time  . furosemide (LASIX) 20 MG tablet TAKE 20 MG EVERY OTHER DAY ALTERNATING WITH 30 MG EVERY OTHER DAY 135 tablet 1  10/07/2018 at Unknown time  . HYDROcodone-acetaminophen (NORCO) 5-325 MG per tablet Take 1-2 tablets by mouth every 4 (four) hours as needed. (Patient taking differently: Take 1 tablet by mouth every 6 (six) hours as needed for moderate pain. ) 40 tablet 0 10/02/2018 at Unknown time  . Ipratropium-Albuterol (COMBIVENT RESPIMAT) 20-100 MCG/ACT AERS respimat Inhale 1 puff into the lungs as needed. (Patient taking differently: Inhale 1 puff into the lungs 4 (four) times daily. ) 1 Inhaler 1 09/16/2018 at Unknown time  . loperamide (IMODIUM) 2 MG capsule Take 1 capsule (2 mg total) by mouth as needed for diarrhea or loose stools. (Patient taking differently: Take 4 mg by mouth daily as needed for diarrhea or loose  stools. ) 20 capsule 2 09/30/2018 at Unknown time  . potassium chloride SA (K-DUR,KLOR-CON) 20 MEQ tablet Take 0.5 tablets (10 mEq total) by mouth daily. (Patient taking differently: Take 20 mEq by mouth 2 (two) times daily. ) 90 tablet 3 09/15/2018 at Unknown time  . SSD 1 % cream Apply 1 application topically 3 (three) times daily as needed. Applied to legs as needed   Taking  . albuterol (PROVENTIL) (2.5 MG/3ML) 0.083% nebulizer solution Inhale 3 mLs into the lungs every 4 (four) hours as needed for wheezing or shortness of breath.    Taking  . diltiazem (CARDIZEM CD) 120 MG 24 hr capsule TAKE (1) CAPSULE BY MOUTH TWICE DAILY. 180 capsule 0  at Unknown time  . diltiazem (CARDIZEM CD) 120 MG 24 hr capsule TAKE (1) CAPSULE BY MOUTH TWICE DAILY. 180 capsule 3   . vitamin B-12 (CYANOCOBALAMIN) 1000 MCG tablet Take 1 tablet (1,000 mcg total) by mouth daily. 60 tablet 2 Taking  . warfarin (COUMADIN) 5 MG tablet Take 0.5-1 tablets (2.5-5 mg total) by mouth See admin instructions. TAKE 1/2 TABLET (2.'5mg'$  total) BY MOUTH DAILY, EXCEPT TAKE 1 TABLET ('5mg'$  total) ON MONDAYS, WEDNESDAYS AND FRIDAYS. (Patient not taking: Reported on 10/10/2018) 75 tablet 3 Not Taking at Unknown time   Scheduled: .  chlorhexidine gluconate (MEDLINE KIT)  15 mL Mouth Rinse BID  . feeding supplement (PRO-STAT SUGAR FREE 64)  30 mL Oral Daily  . feeding supplement (VITAL AF 1.2 CAL)  1,000 mL Per Tube Q24H  . furosemide  40 mg Intravenous Daily  . Influenza vac split quadrivalent PF  0.5 mL Intramuscular Tomorrow-1000  . insulin aspart  0-9 Units Subcutaneous Q4H  . ipratropium  0.5 mg Nebulization Q6H  . ketoconazole   Topical BID  . levalbuterol  0.63 mg Nebulization Q6H  . mouth rinse  15 mL Mouth Rinse 10 times per day  . methylPREDNISolone (SOLU-MEDROL) injection  40 mg Intravenous Q6H  . metoprolol tartrate  5 mg Intravenous Q6H  . pantoprazole (PROTONIX) IV  40 mg Intravenous Q24H  . sodium chloride flush  3 mL Intravenous Q12H  . warfarin  2.5 mg Oral Once per day on Sun Tue Thu Sat  . warfarin  5 mg Oral Once per day on Mon Wed Fri  . Warfarin - Pharmacist Dosing Inpatient   Does not apply Q24H   Continuous: . sodium chloride    . sodium chloride 50 mL/hr at 10/15/18 0848  . ampicillin-sulbactam (UNASYN) IV Stopped (10/15/18 0517)  . diltiazem (CARDIZEM) infusion 10 mg/hr (10/15/18 0600)  . propofol (DIPRIVAN) infusion 30 mcg/kg/min (10/15/18 0924)   YIR:SWNIOE chloride, fentaNYL (SUBLIMAZE) injection, fentaNYL (SUBLIMAZE) injection, HYDROcodone-acetaminophen, levalbuterol, LORazepam, sodium chloride flush  Assesment: He has multi-factorial respiratory failure requiring intubation ventilation.  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  He has some element of COPD and that is being treated.  He has multifocal pneumonia from aspiration which is being treated.  Although he has had a wean able situation as far as his ventilator settings are concerned clinically he is not ready Principal Problem:   Atrial fibrillation with RVR ([place]) Active Problems:   Long term current use of anticoagulant therapy   Aortic stenosis   CKD (chronic kidney  disease) stage 3, GFR 30-59 ml/min (H[facility])   Acute on chronic combined systolic and diastolic congestive heart failure ([place])    Plan: Continue ventilator support today.  Continue diuresis.  Continue antibiotics    LOS: 4 days   Alonza Bogus 10/15/2018, 10:50 AM

## 2018-10-15 NOTE — Progress Notes (Signed)
Paged Dr Olevia Bowens to look at patient regarding remaining tachycardic despite cardizem gtt. Also advised increasing creatinine and dark, amber almost tea colored urine. Advised to give 250 mL bolus, get STAT chest xray and BNP.

## 2018-10-15 NOTE — Progress Notes (Signed)
CRITICAL VALUE ALERT  Critical Value:  INR 4.98  Date & Time Notied:  10/15/2018 @ 0448  Provider Notified: Olevia Bowens  Orders Received/Actions taken: awaiting any orders

## 2018-10-16 ENCOUNTER — Inpatient Hospital Stay (HOSPITAL_COMMUNITY): Payer: Medicare Other

## 2018-10-16 DIAGNOSIS — K625 Hemorrhage of anus and rectum: Secondary | ICD-10-CM

## 2018-10-16 DIAGNOSIS — K921 Melena: Secondary | ICD-10-CM | POA: Diagnosis not present

## 2018-10-16 DIAGNOSIS — T45511S Poisoning by anticoagulants, accidental (unintentional), sequela: Secondary | ICD-10-CM

## 2018-10-16 DIAGNOSIS — R791 Abnormal coagulation profile: Secondary | ICD-10-CM | POA: Diagnosis not present

## 2018-10-16 LAB — RENAL FUNCTION PANEL
ANION GAP: 9 (ref 5–15)
Albumin: 3.2 g/dL — ABNORMAL LOW (ref 3.5–5.0)
BUN: 80 mg/dL — ABNORMAL HIGH (ref 8–23)
CO2: 24 mmol/L (ref 22–32)
Calcium: 7.7 mg/dL — ABNORMAL LOW (ref 8.9–10.3)
Chloride: 108 mmol/L (ref 98–111)
Creatinine, Ser: 2.3 mg/dL — ABNORMAL HIGH (ref 0.61–1.24)
GFR calc Af Amer: 30 mL/min — ABNORMAL LOW (ref >60)
GFR calc non Af Amer: 26 mL/min — ABNORMAL LOW (ref >60)
Glucose, Bld: 225 mg/dL — ABNORMAL HIGH (ref 70–99)
Phosphorus: 4.6 mg/dL (ref 2.5–4.6)
Potassium: 3.4 mmol/L — ABNORMAL LOW (ref 3.5–5.1)
Sodium: 141 mmol/L (ref 135–145)

## 2018-10-16 LAB — BASIC METABOLIC PANEL
Anion gap: 9 (ref 5–15)
BUN: 82 mg/dL — AB (ref 8–23)
CHLORIDE: 113 mmol/L — AB (ref 98–111)
CO2: 23 mmol/L (ref 22–32)
Calcium: 7.9 mg/dL — ABNORMAL LOW (ref 8.9–10.3)
Creatinine, Ser: 1.96 mg/dL — ABNORMAL HIGH (ref 0.61–1.24)
GFR calc Af Amer: 37 mL/min — ABNORMAL LOW (ref >60)
GFR calc non Af Amer: 32 mL/min — ABNORMAL LOW (ref >60)
Glucose, Bld: 228 mg/dL — ABNORMAL HIGH (ref 70–99)
Potassium: 3.7 mmol/L (ref 3.5–5.1)
Sodium: 145 mmol/L (ref 135–145)

## 2018-10-16 LAB — HEMOGLOBIN A1C
Hgb A1c MFr Bld: 5.1 % (ref 4.8–5.6)
Mean Plasma Glucose: 99.67 mg/dL

## 2018-10-16 LAB — COMPREHENSIVE METABOLIC PANEL
ALK PHOS: 62 U/L (ref 38–126)
ALT: 10 U/L (ref 0–44)
AST: 22 U/L (ref 15–41)
Albumin: 3 g/dL — ABNORMAL LOW (ref 3.5–5.0)
Anion gap: 10 (ref 5–15)
BUN: 80 mg/dL — ABNORMAL HIGH (ref 8–23)
CO2: 22 mmol/L (ref 22–32)
Calcium: 7.6 mg/dL — ABNORMAL LOW (ref 8.9–10.3)
Chloride: 109 mmol/L (ref 98–111)
Creatinine, Ser: 2.58 mg/dL — ABNORMAL HIGH (ref 0.61–1.24)
GFR calc Af Amer: 26 mL/min — ABNORMAL LOW (ref >60)
GFR calc non Af Amer: 23 mL/min — ABNORMAL LOW (ref >60)
GLUCOSE: 225 mg/dL — AB (ref 70–99)
Potassium: 4 mmol/L (ref 3.5–5.1)
Sodium: 141 mmol/L (ref 135–145)
Total Bilirubin: 0.7 mg/dL (ref 0.3–1.2)
Total Protein: 6.3 g/dL — ABNORMAL LOW (ref 6.5–8.1)

## 2018-10-16 LAB — CBC WITH DIFFERENTIAL/PLATELET
Abs Immature Granulocytes: 0.03 10*3/uL (ref 0.00–0.07)
Abs Immature Granulocytes: 0.12 10*3/uL — ABNORMAL HIGH (ref 0.00–0.07)
BASOS ABS: 0 10*3/uL (ref 0.0–0.1)
Basophils Absolute: 0 10*3/uL (ref 0.0–0.1)
Basophils Relative: 0 %
Basophils Relative: 0 %
Eosinophils Absolute: 0 10*3/uL (ref 0.0–0.5)
Eosinophils Absolute: 0.2 10*3/uL (ref 0.0–0.5)
Eosinophils Relative: 0 %
Eosinophils Relative: 3 %
HCT: 33.5 % — ABNORMAL LOW (ref 39.0–52.0)
HEMATOCRIT: 31.3 % — AB (ref 39.0–52.0)
Hemoglobin: 10.3 g/dL — ABNORMAL LOW (ref 13.0–17.0)
Hemoglobin: 9.4 g/dL — ABNORMAL LOW (ref 13.0–17.0)
Immature Granulocytes: 0 %
Immature Granulocytes: 1 %
Lymphocytes Relative: 3 %
Lymphocytes Relative: 3 %
Lymphs Abs: 0.2 10*3/uL — ABNORMAL LOW (ref 0.7–4.0)
Lymphs Abs: 0.3 10*3/uL — ABNORMAL LOW (ref 0.7–4.0)
MCH: 29.3 pg (ref 26.0–34.0)
MCH: 29.5 pg (ref 26.0–34.0)
MCHC: 30 g/dL (ref 30.0–36.0)
MCHC: 30.7 g/dL (ref 30.0–36.0)
MCV: 96 fL (ref 80.0–100.0)
MCV: 97.5 fL (ref 80.0–100.0)
Monocytes Absolute: 0.3 10*3/uL (ref 0.1–1.0)
Monocytes Absolute: 0.3 10*3/uL (ref 0.1–1.0)
Monocytes Relative: 2 %
Monocytes Relative: 4 %
NEUTROS ABS: 9.6 10*3/uL — AB (ref 1.7–7.7)
NEUTROS PCT: 94 %
NRBC: 0 % (ref 0.0–0.2)
Neutro Abs: 6.6 10*3/uL (ref 1.7–7.7)
Neutrophils Relative %: 90 %
Platelets: 110 10*3/uL — ABNORMAL LOW (ref 150–400)
Platelets: 93 10*3/uL — ABNORMAL LOW (ref 150–400)
RBC: 3.21 MIL/uL — ABNORMAL LOW (ref 4.22–5.81)
RBC: 3.49 MIL/uL — ABNORMAL LOW (ref 4.22–5.81)
RDW: 14.1 % (ref 11.5–15.5)
RDW: 14.3 % (ref 11.5–15.5)
WBC: 10.3 10*3/uL (ref 4.0–10.5)
WBC: 7.3 10*3/uL (ref 4.0–10.5)
nRBC: 0 % (ref 0.0–0.2)

## 2018-10-16 LAB — HEPATIC FUNCTION PANEL
ALBUMIN: 3.2 g/dL — AB (ref 3.5–5.0)
ALT: 17 U/L (ref 0–44)
AST: 32 U/L (ref 15–41)
Alkaline Phosphatase: 56 U/L (ref 38–126)
BILIRUBIN TOTAL: 0.9 mg/dL (ref 0.3–1.2)
Bilirubin, Direct: 0.3 mg/dL — ABNORMAL HIGH (ref 0.0–0.2)
Indirect Bilirubin: 0.6 mg/dL (ref 0.3–0.9)
Total Protein: 6.2 g/dL — ABNORMAL LOW (ref 6.5–8.1)

## 2018-10-16 LAB — TROPONIN I: Troponin I: 0.08 ng/mL (ref <0.03)

## 2018-10-16 LAB — CBC
HCT: 30.8 % — ABNORMAL LOW (ref 39.0–52.0)
Hemoglobin: 9.5 g/dL — ABNORMAL LOW (ref 13.0–17.0)
MCH: 29.8 pg (ref 26.0–34.0)
MCHC: 30.8 g/dL (ref 30.0–36.0)
MCV: 96.6 fL (ref 80.0–100.0)
Platelets: 90 10*3/uL — ABNORMAL LOW (ref 150–400)
RBC: 3.19 MIL/uL — ABNORMAL LOW (ref 4.22–5.81)
RDW: 14.2 % (ref 11.5–15.5)
WBC: 6.7 10*3/uL (ref 4.0–10.5)
nRBC: 0 % (ref 0.0–0.2)

## 2018-10-16 LAB — BLOOD GAS, ARTERIAL
Acid-base deficit: 0.1 mmol/L (ref 0.0–2.0)
Bicarbonate: 24.5 mmol/L (ref 20.0–28.0)
Drawn by: 22223
FIO2: 35
LHR: 16 {breaths}/min
MECHVT: 540 mL
O2 Saturation: 98.1 %
PEEP/CPAP: 5 cmH2O
pCO2 arterial: 36.5 mmHg (ref 32.0–48.0)
pH, Arterial: 7.427 (ref 7.350–7.450)
pO2, Arterial: 104 mmHg (ref 83.0–108.0)

## 2018-10-16 LAB — GLUCOSE, CAPILLARY
Glucose-Capillary: 221 mg/dL — ABNORMAL HIGH (ref 70–99)
Glucose-Capillary: 216 mg/dL — ABNORMAL HIGH (ref 70–99)
Glucose-Capillary: 146 mg/dL — ABNORMAL HIGH (ref 70–99)
Glucose-Capillary: 198 mg/dL — ABNORMAL HIGH (ref 70–99)
Glucose-Capillary: 212 mg/dL — ABNORMAL HIGH (ref 70–99)

## 2018-10-16 LAB — TYPE AND SCREEN
ABO/RH(D): O POS
Antibody Screen: NEGATIVE

## 2018-10-16 LAB — PROTIME-INR
INR: 5.82
INR: 1.67
Prothrombin Time: 51.3 seconds — ABNORMAL HIGH (ref 11.4–15.2)
Prothrombin Time: 19.5 seconds — ABNORMAL HIGH (ref 11.4–15.2)

## 2018-10-16 LAB — TRIGLYCERIDES: Triglycerides: 76 mg/dL (ref <150)

## 2018-10-16 LAB — MAGNESIUM: Magnesium: 2.9 mg/dL — ABNORMAL HIGH (ref 1.7–2.4)

## 2018-10-16 MED ORDER — SODIUM CHLORIDE 0.9 % IV BOLUS
500.0000 mL | Freq: Once | INTRAVENOUS | Status: AC
  Administered 2018-10-16: 500 mL via INTRAVENOUS

## 2018-10-16 MED ORDER — METHYLPREDNISOLONE SODIUM SUCC 40 MG IJ SOLR
40.0000 mg | Freq: Three times a day (TID) | INTRAMUSCULAR | Status: DC
  Administered 2018-10-16 – 2018-10-20 (×11): 40 mg via INTRAVENOUS
  Filled 2018-10-16 (×11): qty 1

## 2018-10-16 MED ORDER — VITAMIN K1 10 MG/ML IJ SOLN
INTRAMUSCULAR | Status: AC
  Filled 2018-10-16: qty 1

## 2018-10-16 MED ORDER — METOPROLOL TARTRATE 5 MG/5ML IV SOLN
2.5000 mg | Freq: Once | INTRAVENOUS | Status: AC
  Administered 2018-10-16: 2.5 mg via INTRAVENOUS

## 2018-10-16 MED ORDER — SODIUM CHLORIDE 0.9% IV SOLUTION: Freq: Once | INTRAVENOUS | Status: AC

## 2018-10-16 MED ORDER — CALCIUM GLUCONATE 10 % IV SOLN
1.0000 g | Freq: Once | INTRAVENOUS | Status: AC
  Administered 2018-10-16: 1 g via INTRAVENOUS
  Filled 2018-10-16 (×2): qty 10

## 2018-10-16 MED ORDER — AMIODARONE LOAD VIA INFUSION
150.0000 mg | Freq: Once | INTRAVENOUS | Status: AC
  Administered 2018-10-16: 150 mg via INTRAVENOUS
  Filled 2018-10-16: qty 83.34

## 2018-10-16 MED ORDER — VITAMIN K1 10 MG/ML IJ SOLN
10.0000 mg | Freq: Once | INTRAVENOUS | Status: AC
  Administered 2018-10-16: 10 mg via INTRAVENOUS
  Filled 2018-10-16: qty 1

## 2018-10-16 MED ORDER — POTASSIUM CHLORIDE 10 MEQ/100ML IV SOLN
10.0000 meq | INTRAVENOUS | Status: AC
  Administered 2018-10-16 – 2018-10-17 (×2): 10 meq via INTRAVENOUS
  Filled 2018-10-16 (×2): qty 100

## 2018-10-16 MED ORDER — AMIODARONE HCL IN DEXTROSE 360-4.14 MG/200ML-% IV SOLN
60.0000 mg/h | INTRAVENOUS | Status: AC
  Administered 2018-10-16: 60 mg/h via INTRAVENOUS
  Filled 2018-10-16 (×2): qty 200

## 2018-10-16 MED ORDER — MAGNESIUM SULFATE IN D5W 1-5 GM/100ML-% IV SOLN
1.0000 g | Freq: Once | INTRAVENOUS | Status: AC
  Administered 2018-10-16: 1 g via INTRAVENOUS
  Filled 2018-10-16: qty 100

## 2018-10-16 MED ORDER — AMIODARONE HCL IN DEXTROSE 360-4.14 MG/200ML-% IV SOLN
30.0000 mg/h | INTRAVENOUS | Status: DC
  Administered 2018-10-17 – 2018-10-19 (×5): 30 mg/h via INTRAVENOUS
  Filled 2018-10-16 (×6): qty 200

## 2018-10-16 NOTE — Progress Notes (Signed)
Night shift hospitalist ICU coverage note.  The patient was seen due to polymorphic Vtach vs afib/LBBB with RVR . He has been started on amiodarone infusion and potassium supplementation by eLink. I have ordered metoprolol boluses 2.5 mg IVPx3. He also received Metoprolol 5 mg IVP every 6 hrs, an hour earlier than scheduled. Mag 1 gr IVPB, potassium 10 MEQ x2 and calcium gluconate were given as well. I discussed the case with Dr. Radford Pax, who is on call for cardiology saw his EKG tracings and she believes that this is due to artifact and recommended to change EKG leads.   Tennis Must, MD

## 2018-10-16 NOTE — Progress Notes (Signed)
PROGRESS NOTE  Cory Ryan  VOZ:[id number]  DOB: March 31, 1941  DOA: 10/10/2018 PCP: [name], MD   Brief Admission Hx: Cory Ryan is a 78 y.o. male with medical history significant of chronic systolic congestive heart failure, chronic kidney disease, COPD, A. fib, chronic anticoagulation on Coumadin sent in from his primary care physician's office for A. fib with RVR.  Pt had a suspected aspiration event and developed acute respiratory failure and was subsequently intubated.  Pt had an acute rectal bleed and warfarin has been reversed    MDM/Assessment & Plan:   1. Atrial fibrillation with RVR -He is off IV diltiazem infusion this morning but could restart if HR becomes uncontrolled.  He remains on IV lopressor.  HR is better controlled but BP remains soft.    2. Acute on chronic combined systolic and diastolic CHF - He continues to have pulmonary edema on CXR 2D echocardiogram with findings of recent change in EF down to 40% Please see full Echo report.  IV lasix being held today due to bump in creatinine.    3. Elevated troponin - suspect this is demand ischemia from respiratory distress and acute CHF.  Cardiology team is following.   4. Multifocal pneumonia - Continue IV unasyn.  Continue supportive therapy.  Currently on vent. Pulmonary following.  5. Hematochezia - Pt had spontaneous bout gross rectal bleeding last night.  He received vit K and FFP. His hemodynamics are stable now, bleeding seems to have stopped.  He remains critically ill.  I had long discussion with son this morning.    6. Acute on chronic respiratory failure - Continue IV steroids and vent management and scheduled nebs.    7. Chronic aortic stenosis - stable. Follow.   8. Chronic anticoagulation - He had been off warfarin for last few days and INR continued to rise.  He has received FFP and vitamin K to reverse.   9. Stage 3 CKD - creatinine bumping,  Hold lasix today.  Recheck labs in AM.     DVT prophylaxis:  warfarin Code Status: Full  Family Communication: daughter / son Disposition Plan: Pt remains critically ill with poor prognosis, I had discussion with son at bedside, he and sister not on same page at this time, will try to speak with daughter later today.   Antibiotics  Unasyn 1/3 >  Subjective: Pt is intubated on vent.   He is fully sedated.   Objective: Vitals:   10/16/18 0615 10/16/18 0630 10/16/18 0638 10/16/18 0716  BP: 112/76 100/60 112/60   Pulse: 82 (!) 104 99 99  Resp: (!) 21 20 (!) 21 18  Temp:   97.6 F (36.4 C) 97.8 F (36.6 C)  TempSrc:   Axillary Axillary  SpO2: 100% 100% 100% 99%  Weight:      Height:        Intake/Output Summary (Last 24 hours) at 10/16/2018 0737 Last data filed at 10/16/2018 4259 Gross per 24 hour  Intake 4505.22 ml  Output 975 ml  Net 3530.22 ml   Filed Weights   10/14/18 0500 10/15/18 0423 10/16/18 0453  Weight: 62.7 kg 63.7 kg 67 kg    REVIEW OF SYSTEMS  UTO due to condition  Exam:  General exam: elderly male, chronically ill appearing, severe seb dermatitis on face. He is sedated on vent.   Respiratory system:   Bibasilar crackles persist.  Cardiovascular system: irregularly irregular S1 & S2 heard.  Gastrointestinal system: Abdomen is nondistended, soft and nontender.  Normal bowel sounds heard. Central nervous system: sedated on vent. No focal neurological deficits. Extremities: chronic skin changes, no pretibial edema, faint pedal pulses.   Data Reviewed: Basic Metabolic Panel: Recent Labs  Lab 10/12/18 0331 10/13/18 0547 10/14/18 0436 10/14/18 2334 10/15/18 0346 10/16/18 0025  NA 138 137 139  --  137 141  K 3.2* 3.7 3.8 3.7 3.8 4.0  CL 102 103 106  --  105 109  CO2 25 25 21*  --  21* 22  GLUCOSE 104* 115* 171*  --  246* 225*  BUN 26* 28* 37*  --  57* 80*  CREATININE 1.34* 1.43* 1.90*  --  2.31* 2.58*  CALCIUM 8.4* 8.4* 8.4*  --  7.9* 7.6*  MG 2.1 2.2 2.3 2.6*  --   --    Liver Function Tests: Recent Labs    Lab 09/11/2018 1155 10/13/18 0547 10/14/18 0436 10/16/18 0025  AST 22 15 14* 22  ALT '13 12 12 10  '$ ALKPHOS 71 67 62 62  BILITOT 1.5* 1.2 1.1 0.7  PROT 6.9 6.2* 6.4* 6.3*  ALBUMIN 3.7 3.1* 3.0* 3.0*   Recent Labs  Lab 09/27/2018 1155  LIPASE 31   No results for input(s): AMMONIA in the last 168 hours. CBC: Recent Labs  Lab 09/11/2018 1155 10/12/18 0331 10/14/18 0436 10/16/18 0025  WBC 5.4 6.3 3.0* 10.3  NEUTROABS 4.2  --   --  9.6*  HGB 12.4* 12.2* 11.0* 10.3*  HCT 39.7 39.2 34.7* 33.5*  MCV 96.6 95.6 95.9 96.0  PLT 59* 76* 65* 110*   Cardiac Enzymes: Recent Labs  Lab 09/17/2018 1559 10/03/2018 2121 10/12/18 0331  TROPONINI 0.21* 0.30* 0.30*   CBG (last 3)  Recent Labs    10/15/18 1956 10/16/18 0354 10/16/18 0718  GLUCAP 140* 221* 216*   Recent Results (from the past 240 hour(s))  MRSA PCR Screening     Status: None   Collection Time: 10/12/18  5:36 AM  Result Value Ref Range Status   MRSA by PCR NEGATIVE NEGATIVE Final    Comment:        The GeneXpert MRSA Assay (FDA approved for NASAL specimens only), is one component of a comprehensive MRSA colonization surveillance program. It is not intended to diagnose MRSA infection nor to guide or monitor treatment for MRSA infections. Performed at [facility], [address] [place], [place] [address]      Studies: Dg Chest [facility] 1 View  Result Date: 10/15/2018 CLINICAL DATA:  Initial evaluation for acute respiratory failure, fluid overload. EXAM: PORTABLE CHEST 1 VIEW COMPARISON:  Prior radiograph from 10/13/2018. FINDINGS: Endotracheal tube in place with tip position 5.7 cm above the carina. Enteric tube courses into the abdomen. Median sternotomy wires with underlying CABG markers noted. Stable cardiomegaly. Mediastinal silhouette normal. Lungs mildly hypoinflated. Persistent interstitial vascular congestion with small bilateral pleural effusions. Superimposed hazy bibasilar opacities favored to reflect edema  and/or atelectasis, although infiltrates would be difficult to exclude. No pneumothorax. No acute osseous abnormality. IMPRESSION: 1. Support apparatus in satisfactory position. 2. Persistent mild diffuse pulmonary interstitial congestion/edema with small bilateral pleural effusions. 3. Superimposed hazy bibasilar opacities, favored to reflect atelectasis and/or edema. Superimposed infiltrates could be considered in the correct clinical setting. Electronically Signed   By: Jeannine Boga M.D.   On: 10/15/2018 03:56   Scheduled Meds: . chlorhexidine gluconate (MEDLINE KIT)  15 mL Mouth Rinse BID  . feeding supplement (PRO-STAT SUGAR FREE 64)  30 mL Oral Daily  . feeding supplement (  VITAL AF 1.2 CAL)  1,000 mL Per Tube Q24H  . Influenza vac split quadrivalent PF  0.5 mL Intramuscular Tomorrow-1000  . insulin aspart  0-9 Units Subcutaneous Q4H  . insulin glargine  12 Units Subcutaneous QHS  . ipratropium  0.5 mg Nebulization Q6H  . ketoconazole   Topical BID  . levalbuterol  0.63 mg Nebulization Q6H  . mouth rinse  15 mL Mouth Rinse 10 times per day  . methylPREDNISolone (SOLU-MEDROL) injection  40 mg Intravenous Q6H  . metoprolol tartrate  5 mg Intravenous Q6H  . pantoprazole (PROTONIX) IV  40 mg Intravenous Q24H  . sodium chloride flush  3 mL Intravenous Q12H  . Warfarin - Pharmacist Dosing Inpatient   Does not apply Q24H   Continuous Infusions: . sodium chloride    . sodium chloride 50 mL/hr at 10/15/18 0848  . ampicillin-sulbactam (UNASYN) IV 3 g (10/16/18 0403)  . diltiazem (CARDIZEM) infusion Stopped (10/16/18 0313)  . propofol (DIPRIVAN) infusion 30 mcg/kg/min (10/16/18 0403)    Principal Problem:   Atrial fibrillation with RVR (HCC) Active Problems:   Long term current use of anticoagulant therapy   Aortic stenosis   CKD (chronic kidney disease) stage 3, GFR 30-59 ml/min (HCC)   Acute on chronic combined systolic and diastolic congestive heart failure [facility])  Critical  Care Time spent: 34 mins   Wynetta Emery, MD [facility]  If 7PM-7AM, please contact night-coverage [email or web address] Password [email or web address] 10/16/2018, 7:37 AM    LOS: 5 days

## 2018-10-16 NOTE — Progress Notes (Signed)
Subjective: He had rectal bleeding last night.  Warfarin has been reversed and he received fresh frozen plasma and platelets last night.  He had a small amount of bleeding in the rectum this morning.  He remains intubated and on the ventilator.  Objective: Vital signs in last 24 hours: Temp:  [97.4 F (36.3 C)-99.2 F (37.3 C)] 97.8 F (36.6 C) (01/05 0716) Pulse Rate:  [27-117] 99 (01/05 0716) Resp:  [0-31] 18 (01/05 0716) BP: (88-121)/(46-84) 112/60 (01/05 0638) SpO2:  [87 %-100 %] 99 % (01/05 0716) FiO2 (%):  [35 %] 35 % (01/05 0612) Weight:  [67 kg] 67 kg (01/05 0453) Weight change: 3.3 kg Last BM Date: 10/03/2018  Intake/Output from previous day: 01/04 0701 - 01/05 0700 In: 4505.2 [I.V.:924.1; Blood:1429; NG/GT:1045.2; IV BTDHRCBUL:845] Out: 975 [Urine:975]  PHYSICAL EXAM General appearance: Intubated sedated on mechanical ventilation Resp: rhonchi bilaterally Cardio: irregularly irregular rhythm GI: soft, non-tender; bowel sounds normal; no masses,  no organomegaly Extremities: extremities normal, atraumatic, no cyanosis or edema He still has discoloration of both legs which is chronic Lab Results:  Results for orders placed or performed during the hospital encounter of 09/17/2018 (from the past 48 hour(s))  Potassium     Status: None   Collection Time: 10/14/18 11:34 PM  Result Value Ref Range   Potassium 3.7 3.5 - 5.1 mmol/L    Comment: Performed at [facility], [address] [place], [place] [address]  Magnesium     Status: Abnormal   Collection Time: 10/14/18 11:34 PM  Result Value Ref Range   Magnesium 2.6 (H) 1.7 - 2.4 mg/dL    Comment: Performed at [facility], [address], [place], [place] [address]  Blood gas, arterial     Status: Abnormal   Collection Time: 10/15/18  3:25 AM  Result Value Ref Range   FIO2 35.00    Delivery systems VENTILATOR    Mode PRESSURE REGULATED VOLUME CONTROL    VT 540 mL   LHR 16 resp/min   Peep/cpap 5.0 cm H20   pH,  Arterial 7.423 7.350 - 7.450   pCO2 arterial 38.0 32.0 - 48.0 mmHg   pO2, Arterial 121 (H) 83.0 - 108.0 mmHg   Bicarbonate 24.9 20.0 - 28.0 mmol/L   Acid-Base Excess 0.4 0.0 - 2.0 mmol/L   O2 Saturation 98.4 %   Patient temperature 37.0    Collection site RIGHT BRACHIAL    Drawn by [id number]    Sample type ARTHROGRAPHIS SPECIES    Allens test (pass/fail) PASS PASS    Comment: Performed at [facility], [address], [place], [place] [address]  Protime-INR     Status: Abnormal   Collection Time: 10/15/18  3:46 AM  Result Value Ref Range   Prothrombin Time 45.4 (H) 11.4 - 15.2 seconds   INR 4.98 (HH)     Comment: RESULT REPEATED AND VERIFIED CRITICAL RESULT CALLED TO, READ BACK BY AND VERIFIED WITH: WADE,S @ 0446 ON 10/15/18 BY JUW Performed at [facility], [address] [place],  [address]   Brain natriuretic peptide     Status: Abnormal   Collection Time: 10/15/18  3:46 AM  Result Value Ref Range   B Natriuretic Peptide 373.0 (H) 0.0 - 100.0 pg/mL    Comment: Performed at [facility], [address] [place],  [address]  Basic metabolic panel     Status: Abnormal   Collection Time: 10/15/18  3:46 AM  Result Value Ref Range   Sodium 137 135 -  145 mmol/L   Potassium 3.8 3.5 - 5.1 mmol/L   Chloride 105 98 - 111 mmol/L   CO2 21 (L) 22 - 32 mmol/L   Glucose, Bld 246 (H) 70 - 99 mg/dL   BUN 57 (H) 8 - 23 mg/dL   Creatinine, Ser 2.31 (H) 0.61 - 1.24 mg/dL   Calcium 7.9 (L) 8.9 - 10.3 mg/dL   GFR calc non Af Amer 26 (L) >60 mL/min   GFR calc Af Amer 30 (L) >60 mL/min   Anion gap 11 5 - 15    Comment: Performed at [facility], [address] [place], [place] [address]  Glucose, capillary     Status: Abnormal   Collection Time: 10/15/18  7:28 AM  Result Value Ref Range   Glucose-Capillary 259 (H) 70 - 99 mg/dL  Glucose, capillary     Status: Abnormal   Collection Time: 10/15/18 11:33 AM  Result Value Ref Range   Glucose-Capillary 219 (H) 70 - 99 mg/dL  Glucose,  capillary     Status: Abnormal   Collection Time: 10/15/18  4:16 PM  Result Value Ref Range   Glucose-Capillary 230 (H) 70 - 99 mg/dL  Glucose, capillary     Status: Abnormal   Collection Time: 10/15/18  7:56 PM  Result Value Ref Range   Glucose-Capillary 140 (H) 70 - 99 mg/dL  Prepare fresh frozen plasma     Status: None (Preliminary result)   Collection Time: 10/16/18 12:19 AM  Result Value Ref Range   Unit Number [id number]    Blood Component Type THW PLS APHR    Unit division A0    Status of Unit ISSUED    Transfusion Status OK TO TRANSFUSE    Unit Number [id number]    Blood Component Type THAWED PLASMA    Unit division 00    Status of Unit ISSUED    Transfusion Status      OK TO TRANSFUSE Performed at [facility], [address] [place], [place] [address]   CBC with Differential/Platelet     Status: Abnormal   Collection Time: 10/16/18 12:25 AM  Result Value Ref Range   WBC 10.3 4.0 - 10.5 K/uL   RBC 3.49 (L) 4.22 - 5.81 MIL/uL   Hemoglobin 10.3 (L) 13.0 - 17.0 g/dL   HCT 33.5 (L) 39.0 - 52.0 %   MCV 96.0 80.0 - 100.0 fL   MCH 29.5 26.0 - 34.0 pg   MCHC 30.7 30.0 - 36.0 g/dL   RDW 14.1 11.5 - 15.5 %   Platelets 110 (L) 150 - 400 K/uL    Comment: SPECIMEN CHECKED FOR CLOTS CONSISTENT WITH PREVIOUS RESULT    nRBC 0.0 0.0 - 0.2 %   Neutrophils Relative % 94 %   Neutro Abs 9.6 (H) 1.7 - 7.7 K/uL   Lymphocytes Relative 3 %   Lymphs Abs 0.3 (L) 0.7 - 4.0 K/uL   Monocytes Relative 2 %   Monocytes Absolute 0.3 0.1 - 1.0 K/uL   Eosinophils Relative 0 %   Eosinophils Absolute 0.0 0.0 - 0.5 K/uL   Basophils Relative 0 %   Basophils Absolute 0.0 0.0 - 0.1 K/uL   Immature Granulocytes 1 %   Abs Immature Granulocytes 0.12 (H) 0.00 - 0.07 K/uL    Comment: Performed at [facility], [address] [place], [place] [address]  Type and screen [facility]     Status: None   Collection Time: 10/16/18 12:25 AM  Result Value Ref Range  ABO/RH(D) O POS     Antibody Screen NEG    Sample Expiration      10/19/2018 Performed at [facility], [address], [place], [place] [address]   Comprehensive metabolic panel     Status: Abnormal   Collection Time: 10/16/18 12:25 AM  Result Value Ref Range   Sodium 141 135 - 145 mmol/L   Potassium 4.0 3.5 - 5.1 mmol/L   Chloride 109 98 - 111 mmol/L   CO2 22 22 - 32 mmol/L   Glucose, Bld 225 (H) 70 - 99 mg/dL   BUN 80 (H) 8 - 23 mg/dL   Creatinine, Ser 2.58 (H) 0.61 - 1.24 mg/dL   Calcium 7.6 (L) 8.9 - 10.3 mg/dL   Total Protein 6.3 (L) 6.5 - 8.1 g/dL   Albumin 3.0 (L) 3.5 - 5.0 g/dL   AST 22 15 - 41 U/L   ALT 10 0 - 44 U/L   Alkaline Phosphatase 62 38 - 126 U/L   Total Bilirubin 0.7 0.3 - 1.2 mg/dL   GFR calc non Af Amer 23 (L) >60 mL/min   GFR calc Af Amer 26 (L) >60 mL/min   Anion gap 10 5 - 15    Comment: Performed at [facility], [address] [place], [place] [address]  Protime-INR     Status: Abnormal   Collection Time: 10/16/18 12:25 AM  Result Value Ref Range   Prothrombin Time 51.3 (H) 11.4 - 15.2 seconds   INR 5.82 (HH)     Comment: REPEATED TO VERIFY CRITICAL RESULT CALLED TO, READ BACK BY AND VERIFIED WITH: JERFFERSON,V. AT 0981 ON 10/16/2018 BY EVA Performed at [facility], [address] [place], [place] [address]   Prepare fresh frozen plasma     Status: None (Preliminary result)   Collection Time: 10/16/18  1:21 AM  Result Value Ref Range   Unit Number [id number]    Blood Component Type THAWED PLASMA    Unit division 00    Status of Unit ISSUED    Transfusion Status      OK TO TRANSFUSE Performed at [facility], [address] [place], [place] [address]   Blood gas, arterial     Status: Abnormal   Collection Time: 10/16/18  3:20 AM  Result Value Ref Range   FIO2 35.00    Delivery systems VENTILATOR    Mode PRESSURE REGULATED VOLUME CONTROL    VT 540 mL   LHR 16 resp/min   Peep/cpap 5.0 cm H20   pH, Arterial 7.427 7.350 - 7.450   pCO2 arterial 36.5 32.0 -  48.0 mmHg   pO2, Arterial 104.0 83.0 - 108.0 mmHg   Bicarbonate 24.5 20.0 - 28.0 mmol/L   Acid-base deficit 0.1 0.0 - 2.0 mmol/L   O2 Saturation 98.1 %   Collection site RIGHT BRACHIAL    Drawn by [id number]    Sample type ARTERIAL    Allens test (pass/fail) NOT INDICATED (A) PASS    Comment: Performed at [facility], [address], [place], [place] [address]  Glucose, capillary     Status: Abnormal   Collection Time: 10/16/18  3:54 AM  Result Value Ref Range   Glucose-Capillary 221 (H) 70 - 99 mg/dL  Glucose, capillary     Status: Abnormal   Collection Time: 10/16/18  7:18 AM  Result Value Ref Range   Glucose-Capillary 216 (H) 70 - 99 mg/dL  Protime-INR     Status: Abnormal   Collection Time: 10/16/18  7:33 AM  Result Value Ref Range   Prothrombin Time 19.5 (H) 11.4 - 15.2 seconds   INR 1.67     Comment: Performed at [facility], [address], [place], [place] [address]  Renal function panel     Status: Abnormal   Collection Time: 10/16/18  7:33 AM  Result Value Ref Range   Sodium 141 135 - 145 mmol/L   Potassium 3.4 (L) 3.5 - 5.1 mmol/L   Chloride 108 98 - 111 mmol/L   CO2 24 22 - 32 mmol/L   Glucose, Bld 225 (H) 70 - 99 mg/dL   BUN 80 (H) 8 - 23 mg/dL   Creatinine, Ser 2.30 (H) 0.61 - 1.24 mg/dL   Calcium 7.7 (L) 8.9 - 10.3 mg/dL   Phosphorus 4.6 2.5 - 4.6 mg/dL   Albumin 3.2 (L) 3.5 - 5.0 g/dL   GFR calc non Af Amer 26 (L) >60 mL/min   GFR calc Af Amer 30 (L) >60 mL/min   Anion gap 9 5 - 15    Comment: Performed at [facility], [address] [place], [place] [address]  CBC     Status: Abnormal   Collection Time: 10/16/18  7:33 AM  Result Value Ref Range   WBC 6.7 4.0 - 10.5 K/uL   RBC 3.19 (L) 4.22 - 5.81 MIL/uL   Hemoglobin 9.5 (L) 13.0 - 17.0 g/dL   HCT 30.8 (L) 39.0 - 52.0 %   MCV 96.6 80.0 - 100.0 fL   MCH 29.8 26.0 - 34.0 pg   MCHC 30.8 30.0 - 36.0 g/dL   RDW 14.2 11.5 - 15.5 %   Platelets 90 (L) 150 - 400 K/uL    Comment: REPEATED TO VERIFY SPECIMEN  CHECKED FOR CLOTS Immature Platelet Fraction may be clinically indicated, consider ordering this additional test [id number]    nRBC 0.0 0.0 - 0.2 %    Comment: Performed at [facility], [address], [place], [place] [address]    ABGS Recent Labs    10/16/18 0320  PHART 7.427  PO2ART 104.0  HCO3 24.5   CULTURES Recent Results (from the past 240 hour(s))  MRSA PCR Screening     Status: None   Collection Time: 10/12/18  5:36 AM  Result Value Ref Range Status   MRSA by PCR NEGATIVE NEGATIVE Final    Comment:        The GeneXpert MRSA Assay (FDA approved for NASAL specimens only), is one component of a comprehensive MRSA colonization surveillance program. It is not intended to diagnose MRSA infection nor to guide or monitor treatment for MRSA infections. Performed at [facility], [address] [place], [place] [address]    Studies/Results: Dg Chest Port 1 View  Result Date: 10/16/2018 CLINICAL DATA:  Respiratory failure. EXAM: PORTABLE CHEST 1 VIEW COMPARISON:  10/15/2018 FINDINGS: The ET tube tip is above the carina. There is an enteric tube with tip below the level of the GE junction. Bilateral pleural effusions and moderate interstitial edema noted. Left lung base opacity is increased from previous exam. IMPRESSION: 1. Worsening aeration to the left lung base. 2. CHF, unchanged. Electronically Signed   By: Kerby Moors M.D.   On: 10/16/2018 07:46   Dg Chest Port 1 View  Result Date: 10/15/2018 CLINICAL DATA:  Initial evaluation for acute respiratory failure, fluid overload. EXAM: PORTABLE CHEST 1 VIEW COMPARISON:  Prior radiograph from 10/13/2018. FINDINGS: Endotracheal tube in place with tip position 5.7 cm above the carina. Enteric tube courses into the abdomen.  Median sternotomy wires with underlying CABG markers noted. Stable cardiomegaly. Mediastinal silhouette normal. Lungs mildly hypoinflated. Persistent interstitial vascular congestion with small bilateral pleural  effusions. Superimposed hazy bibasilar opacities favored to reflect edema and/or atelectasis, although infiltrates would be difficult to exclude. No pneumothorax. No acute osseous abnormality. IMPRESSION: 1. Support apparatus in satisfactory position. 2. Persistent mild diffuse pulmonary interstitial congestion/edema with small bilateral pleural effusions. 3. Superimposed hazy bibasilar opacities, favored to reflect atelectasis and/or edema. Superimposed infiltrates could be considered in the correct clinical setting. Electronically Signed   By: Jeannine Boga M.D.   On: 10/15/2018 03:56    Medications:  Prior to Admission:  Medications Prior to Admission  Medication Sig Dispense Refill Last Dose  . carvedilol (COREG) 6.25 MG tablet TAKE (1) TABLET BY MOUTH TWICE DAILY WTIH A MEAL. (Patient taking differently: Take 6.25 mg by mouth 2 (two) times daily with a meal. TAKE (1) TABLET BY MOUTH TWICE DAILY WTIH A MEAL.) 60 tablet 6 09/15/2018 at Unknown time  . furosemide (LASIX) 20 MG tablet TAKE 20 MG EVERY OTHER DAY ALTERNATING WITH 30 MG EVERY OTHER DAY 135 tablet 1 09/25/2018 at Unknown time  . HYDROcodone-acetaminophen (NORCO) 5-325 MG per tablet Take 1-2 tablets by mouth every 4 (four) hours as needed. (Patient taking differently: Take 1 tablet by mouth every 6 (six) hours as needed for moderate pain. ) 40 tablet 0 09/16/2018 at Unknown time  . Ipratropium-Albuterol (COMBIVENT RESPIMAT) 20-100 MCG/ACT AERS respimat Inhale 1 puff into the lungs as needed. (Patient taking differently: Inhale 1 puff into the lungs 4 (four) times daily. ) 1 Inhaler 1 10/10/2018 at Unknown time  . loperamide (IMODIUM) 2 MG capsule Take 1 capsule (2 mg total) by mouth as needed for diarrhea or loose stools. (Patient taking differently: Take 4 mg by mouth daily as needed for diarrhea or loose stools. ) 20 capsule 2 09/20/2018 at Unknown time  . potassium chloride SA (K-DUR,KLOR-CON) 20 MEQ tablet Take 0.5 tablets (10 mEq  total) by mouth daily. (Patient taking differently: Take 20 mEq by mouth 2 (two) times daily. ) 90 tablet 3 09/26/2018 at Unknown time  . SSD 1 % cream Apply 1 application topically 3 (three) times daily as needed. Applied to legs as needed   Taking  . albuterol (PROVENTIL) (2.5 MG/3ML) 0.083% nebulizer solution Inhale 3 mLs into the lungs every 4 (four) hours as needed for wheezing or shortness of breath.    Taking  . diltiazem (CARDIZEM CD) 120 MG 24 hr capsule TAKE (1) CAPSULE BY MOUTH TWICE DAILY. 180 capsule 0  at Unknown time  . diltiazem (CARDIZEM CD) 120 MG 24 hr capsule TAKE (1) CAPSULE BY MOUTH TWICE DAILY. 180 capsule 3   . vitamin B-12 (CYANOCOBALAMIN) 1000 MCG tablet Take 1 tablet (1,000 mcg total) by mouth daily. 60 tablet 2 Taking  . warfarin (COUMADIN) 5 MG tablet Take 0.5-1 tablets (2.5-5 mg total) by mouth See admin instructions. TAKE 1/2 TABLET (2.'5mg'$  total) BY MOUTH DAILY, EXCEPT TAKE 1 TABLET ('5mg'$  total) ON MONDAYS, WEDNESDAYS AND FRIDAYS. (Patient not taking: Reported on 09/28/2018) 75 tablet 3 Not Taking at Unknown time   Scheduled: . chlorhexidine gluconate (MEDLINE KIT)  15 mL Mouth Rinse BID  . feeding supplement (PRO-STAT SUGAR FREE 64)  30 mL Oral Daily  . feeding supplement (VITAL AF 1.2 CAL)  1,000 mL Per Tube Q24H  . Influenza vac split quadrivalent PF  0.5 mL Intramuscular Tomorrow-1000  . insulin aspart  0-9 Units Subcutaneous Q4H  .  insulin glargine  12 Units Subcutaneous QHS  . ipratropium  0.5 mg Nebulization Q6H  . ketoconazole   Topical BID  . levalbuterol  0.63 mg Nebulization Q6H  . mouth rinse  15 mL Mouth Rinse 10 times per day  . methylPREDNISolone (SOLU-MEDROL) injection  40 mg Intravenous Q6H  . metoprolol tartrate  5 mg Intravenous Q6H  . pantoprazole (PROTONIX) IV  40 mg Intravenous Q24H  . sodium chloride flush  3 mL Intravenous Q12H  . Warfarin - Pharmacist Dosing Inpatient   Does not apply Q24H   Continuous: . sodium chloride    . sodium  chloride 50 mL/hr at 10/15/18 0848  . ampicillin-sulbactam (UNASYN) IV 3 g (10/16/18 0403)  . diltiazem (CARDIZEM) infusion Stopped (10/16/18 0313)  . propofol (DIPRIVAN) infusion 30 mcg/kg/min (10/16/18 0403)   OXB:DZHGDJ chloride, fentaNYL (SUBLIMAZE) injection, fentaNYL (SUBLIMAZE) injection, HYDROcodone-acetaminophen, levalbuterol, LORazepam, sodium chloride flush  Assesment: He was admitted with atrial fib with RVR and then aspirated.  He is on mechanical ventilation since the episode of aspiration.  His chest x-ray which I have personally reviewed today still shows left lower lobe pneumonia and more diffuse infiltrate which may be from the aspiration and may be from heart failure.  He has acute on chronic combined systolic and diastolic heart failure and his ejection fraction was worse.  He had rectal bleeding which is probably a combination of supratherapeutic INR and low platelet count.  Both of those have been treated and he only has a small amount of bleeding now  He has acute on chronic renal failure and his renal function is a little bit better this morning Principal Problem:   Atrial fibrillation with RVR ([place]) Active Problems:   Long term current use of anticoagulant therapy   Aortic stenosis   CKD (chronic kidney disease) stage 3, GFR 30-59 ml/min (HCC)   Acute on chronic combined systolic and diastolic congestive heart failure (HCC)   Hematochezia   Supratherapeutic INR    Plan: I had a long discussion with his daughter at bedside.  I explained all of his problems and she understands that he is critically ill and likely will not survive this episode.  She would like palliative care to help her with decision-making and she is going to discuss the situation with her brother today.    LOS: 5 days   Cory Ryan 10/16/2018, 10:39 AM

## 2018-10-16 NOTE — Progress Notes (Signed)
eLink Physician-Brief Progress Note Patient Name: Cory Ryan DOB: 1941/08/30 MRN: [id number]   Date of Service  10/16/2018  HPI/Events of Note  Multiple issues: 1. K+ = 3.7, Mg++ = 2.9 and Creatinine = 1.96 and 2. Troponin = 0.08 - Demand ischemia?  eICU Interventions  Will order: 1. Replace K+  2. Continue to monitor Troponin.     Intervention Category Major Interventions: Electrolyte abnormality - evaluation and management Intermediate Interventions: Diagnostic test evaluation  [name] Eugene 10/16/2018, 9:19 PM

## 2018-10-16 NOTE — Progress Notes (Signed)
Dr. Olevia Bowens notified of INR 5.82

## 2018-10-16 NOTE — Progress Notes (Signed)
Pt cardizem gtt running at 5 mg/hr with HR running in high 90's. Received verbal order from Dr. Olevia Bowens to stop the drip but to keep bag hanging in case patient becomes tachycardic again.

## 2018-10-16 NOTE — Progress Notes (Signed)
CRITICAL VALUE ALERT  Critical Value:  0.08  Date & Time Notied:  10/16/18 2058  Provider Notified: midlevel  Orders Received/Actions taken: waiting response

## 2018-10-16 NOTE — Progress Notes (Signed)
[facility]   BRYCESON GRAPE TKZ:[id number] DOB: June 23, 1941 DOA: 09/27/2018  PCP: [name], MD   Chief Complaint: Rectal bleeding.  HPI: Cory Ryan is a 78 y.o. male with below past medical history who was admitted with A. fib and RVR, acute on chronic combined systolic and diastolic heart failure, now with multifocal pneumonia and on mechanical ventilation which we are seeing due to acute hematochezia.  His INR has been supratherapeutic.  Review of Systems:  Unable to obtain.  Past Medical History:  Diagnosis Date  . Anemia   . Anxiety   . Aortic stenosis    Moderate  . Arthritis   . Atrial fibrillation ([place])   . Chronic systolic heart failure (HCC)    LVEF 40-45%  . CKD (chronic kidney disease) stage 3, GFR 30-59 ml/min (HCC)   . Colon cancer ([place])   . Colon polyps 12/2005   Per colonoscopy  . COPD (chronic obstructive pulmonary disease) ([place])   . Coronary atherosclerosis of native coronary artery    Multivessel status post CABG  . Emphysema   . Gastric erosions 12/2005   Per EGD - Dr. [name]  . GERD (gastroesophageal reflux disease)   . Hypothyroidism   . Ischemic cardiomyopathy   . Mitral regurgitation    Moderate  . Myocardial infarction [facility])    Remote  . Psoriasis   . Pulmonary nodules    Past Surgical History:  Procedure Laterality Date  . COLECTOMY N/A 12/13/2013   Procedure: TOTAL COLECTOMY;  Surgeon: [name], MD;  Location: [facility] ORS;  Service: General;  Laterality: N/A;  . CORONARY ARTERY BYPASS GRAFT  March 2004   LIMA to LAD, SVG to diagonal, SVG to OM 2, SVG to RCA  . FLEXIBLE SIGMOIDOSCOPY  10/25/2012   Procedure: FLEXIBLE SIGMOIDOSCOPY;  Surgeon: [name], MD;  Location: [facility] ENDO SUITE;  Service: Gastroenterology;  Laterality: N/A;  . PARTIAL COLECTOMY  10/26/2012   Procedure: PARTIAL COLECTOMY;  Surgeon: [name], MD;  Location: [facility] ORS;  Service: General;  Laterality: N/A;  Closure of Colovesical Fistula   Social History:   reports that he quit smoking about 15 years ago. His smoking use included cigarettes. He has a 30.00 pack-year smoking history. He has never used smokeless tobacco. He reports that he does not drink alcohol or use drugs.  No Known Allergies  Family History  Problem Relation Age of Onset  . Cancer Brother   . Heart Problems Mother        unknown  . Hypertension Other    Prior to Admission medications   Medication Sig Start Date End Date Taking? Authorizing Provider  carvedilol (COREG) 6.25 MG tablet TAKE (1) TABLET BY MOUTH TWICE DAILY WTIH A MEAL. Patient taking differently: Take 6.25 mg by mouth 2 (two) times daily with a meal. TAKE (1) TABLET BY MOUTH TWICE DAILY WTIH A MEAL. 03/16/18  Yes [name], MD  furosemide (LASIX) 20 MG tablet TAKE 20 MG EVERY OTHER DAY ALTERNATING WITH 30 MG EVERY OTHER DAY 09/14/18  Yes [name], MD  HYDROcodone-acetaminophen (NORCO) 5-325 MG per tablet Take 1-2 tablets by mouth every 4 (four) hours as needed. Patient taking differently: Take 1 tablet by mouth every 6 (six) hours as needed for moderate pain.  12/18/13  Yes Aviva Signs, MD  Ipratropium-Albuterol (COMBIVENT RESPIMAT) 20-100 MCG/ACT AERS respimat Inhale 1 puff into the lungs as needed. Patient taking differently: Inhale 1 puff into the lungs 4 (four) times  daily.  06/15/13  Yes [name], MD  loperamide (IMODIUM) 2 MG capsule Take 1 capsule (2 mg total) by mouth as needed for diarrhea or loose stools. Patient taking differently: Take 4 mg by mouth daily as needed for diarrhea or loose stools.  10/29/12  Yes Chelsea Primus, MD  potassium chloride SA (K-DUR,KLOR-CON) 20 MEQ tablet Take 0.5 tablets (10 mEq total) by mouth daily. Patient taking differently: Take 20 mEq by mouth 2 (two) times daily.  02/16/17  Yes Lendon Colonel, NP  SSD 1 % cream Apply 1 application topically 3 (three) times daily as needed. Applied to legs as needed 07/14/18  Yes [provider]    albuterol (PROVENTIL) (2.5 MG/3ML) 0.083% nebulizer solution Inhale 3 mLs into the lungs every 4 (four) hours as needed for wheezing or shortness of breath.  08/06/15   [provider]  diltiazem (CARDIZEM CD) 120 MG 24 hr capsule TAKE (1) CAPSULE BY MOUTH TWICE DAILY. 06/14/18   [name], MD  diltiazem (CARDIZEM CD) 120 MG 24 hr capsule TAKE (1) CAPSULE BY MOUTH TWICE DAILY. 09/14/18   [name], MD  vitamin B-12 (CYANOCOBALAMIN) 1000 MCG tablet Take 1 tablet (1,000 mcg total) by mouth daily. 03/16/17   Twana First, MD  warfarin (COUMADIN) 5 MG tablet Take 0.5-1 tablets (2.5-5 mg total) by mouth See admin instructions. TAKE 1/2 TABLET (2.5mg  total) BY MOUTH DAILY, EXCEPT TAKE 1 TABLET (5mg  total) ON MONDAYS, WEDNESDAYS AND FRIDAYS. Patient not taking: Reported on 09/15/2018 09/14/18   [name], MD   Physical Exam: Vitals:   10/16/18 0130 10/16/18 0251 10/16/18 0300 10/16/18 0317  BP: 106/77 102/61 (!) 97/59 119/80  Pulse: 93 78  (!) 58  Resp: 19 16  19   Temp: 97.6 F (36.4 C) 97.7 F (36.5 C) 97.7 F (36.5 C) 97.7 F (36.5 C)  TempSrc: Axillary Axillary Axillary Axillary  SpO2:  100%  100%  Weight:      Height:        Wt Readings from Last 3 Encounters:  10/15/18 63.7 kg  10/09/18 76.2 kg  09/05/18 72.6 kg    General: Sedated. Eyes: PERRL, normal lids, irises & conjunctiva ENT: ET and feeding tube in place. Neck: no JVD, LAD, masses or thyromegaly Cardiovascular: Irregularly irregular. Telemetry: Atrial fibrillation rhythm.   Respiratory: Mild bilateral rhonchi.  No wheezing. Abdomen: soft, ntnd Skin: Multiple areas of ecchymosis on extremities.  Hyperpigmentation of both pretibial areas. Musculoskeletal: Relaxed muscle tone. Psychiatric: On propofol infusion. Neurologic: Sedated at this time.  Unable to fully evaluate.          Labs on Admission:  Basic Metabolic Panel: Recent Labs  Lab 10/12/18 0331 10/13/18 0547 10/14/18 0436  10/14/18 2334 10/15/18 0346 10/16/18 0025  NA 138 137 139  --  137 141  K 3.2* 3.7 3.8 3.7 3.8 4.0  CL 102 103 106  --  105 109  CO2 25 25 21*  --  21* 22  GLUCOSE 104* 115* 171*  --  246* 225*  BUN 26* 28* 37*  --  57* 80*  CREATININE 1.34* 1.43* 1.90*  --  2.31* 2.58*  CALCIUM 8.4* 8.4* 8.4*  --  7.9* 7.6*  MG 2.1 2.2 2.3 2.6*  --   --    Liver Function Tests: Recent Labs  Lab 09/14/2018 1155 10/13/18 0547 10/14/18 0436 10/16/18 0025  AST 22 15 14* 22  ALT 13 12 12 10   ALKPHOS 71 67 62 62  BILITOT 1.5* 1.2 1.1 0.7  PROT 6.9 6.2* 6.4* 6.3*  ALBUMIN 3.7 3.1* 3.0* 3.0*   Recent Labs  Lab 09/27/2018 1155  LIPASE 31   No results for input(s): AMMONIA in the last 168 hours. CBC: Recent Labs  Lab 09/16/2018 1155 10/12/18 0331 10/14/18 0436 10/16/18 0025  WBC 5.4 6.3 3.0* 10.3  NEUTROABS 4.2  --   --  9.6*  HGB 12.4* 12.2* 11.0* 10.3*  HCT 39.7 39.2 34.7* 33.5*  MCV 96.6 95.6 95.9 96.0  PLT 59* 76* 65* 110*   Cardiac Enzymes: Recent Labs  Lab 09/15/2018 1559 09/28/2018 2121 10/12/18 0331  TROPONINI 0.21* 0.30* 0.30*    BNP (last 3 results) Recent Labs    08/07/18 1353 09/24/2018 1155 10/15/18 0346  BNP 459.0* 792.0* 373.0*    ProBNP (last 3 results) No results for input(s): PROBNP in the last 8760 hours.  CBG: Recent Labs  Lab 10/15/18 0728 10/15/18 1133 10/15/18 1616 10/15/18 1956  GLUCAP 259* 219* 230* 140*    Radiological Exams on Admission: Dg Chest Port 1 View  Result Date: 10/15/2018 CLINICAL DATA:  Initial evaluation for acute respiratory failure, fluid overload. EXAM: PORTABLE CHEST 1 VIEW COMPARISON:  Prior radiograph from 10/13/2018. FINDINGS: Endotracheal tube in place with tip position 5.7 cm above the carina. Enteric tube courses into the abdomen. Median sternotomy wires with underlying CABG markers noted. Stable cardiomegaly. Mediastinal silhouette normal. Lungs mildly hypoinflated. Persistent interstitial vascular congestion with small  bilateral pleural effusions. Superimposed hazy bibasilar opacities favored to reflect edema and/or atelectasis, although infiltrates would be difficult to exclude. No pneumothorax. No acute osseous abnormality. IMPRESSION: 1. Support apparatus in satisfactory position. 2. Persistent mild diffuse pulmonary interstitial congestion/edema with small bilateral pleural effusions. 3. Superimposed hazy bibasilar opacities, favored to reflect atelectasis and/or edema. Superimposed infiltrates could be considered in the correct clinical setting. Electronically Signed   By: Jeannine Boga M.D.   On: 10/15/2018 03:56    Assessment/Plan  Rectal bleeding. Warfarin toxicity. Vitamin K 10 mg IVPB. Transfuse 4 units of FFP. NS 500 mL bolus given. Monitor hematocrit and hemoglobin. Transfuse PRBC as needed. Dr. [name] was consulted.  He will evaluate later today. Continue current treatment for all other issues. Discussed at length with the patient's son and daughter.   Reubin Milan [facility]    About 60 minutes of critical care time were spent during the process of this emergent event.  This document was prepared using Dragon voice recognition software and may contain some unintended transcription errors.

## 2018-10-16 NOTE — Progress Notes (Signed)
Pt daughter and son at bedside. Discussed code status with both and at the present time, they are not changing his status. Wanted to speak with Dr. Olevia Bowens who discussed patients condition with them. They both decided they wanted to be here in the morning when the physicians rounds to hear their perspectives.

## 2018-10-16 NOTE — Progress Notes (Addendum)
ANTICOAGULATION CONSULT NOTE  Pharmacy Consult for Coumadin Indication: atrial fibrillation  No Known Allergies  Patient Measurements: Height: 5\' 10"  (177.8 cm) Weight: 147 lb 11.3 oz (67 kg) IBW/kg (Calculated) : 73  Vital Signs: Temp: 98.8 F (37.1 C) (01/05 1118) Temp Source: Axillary (01/05 1118) BP: 112/60 (01/05 5364) Pulse Rate: 126 (01/05 1118)  Labs: Recent Labs    10/14/18 0436 10/15/18 0346 10/16/18 0025 10/16/18 0733  HGB 11.0*  --  10.3* 9.5*  HCT 34.7*  --  33.5* 30.8*  PLT 65*  --  110* 90*  LABPROT 37.3* 45.4* 51.3* 19.5*  INR 3.86 4.98* 5.82* 1.67  CREATININE 1.90* 2.31* 2.58* 2.30*   Estimated Creatinine Clearance: 24.7 mL/min (A) (by C-G formula based on SCr of 2.3 mg/dL (H)).  Assessment: Pharmacy consulted to dose warfarin for this 78 yo male  on chronic warfarin for   atrial fibrillation.    INR yesterday was 4.98, so  warfarin was held.  Rectal bleeding occurred overnight, with subsequent phytonadione 10mg  IV given, along with FFP and platelets. INR is now 1.67.  Liver congestion from CHF exacerbation may be contributing to climbing INRs.  Goal of Therapy:  INR 2-3 Monitor platelets by anticoagulation protocol: Yes   Plan: hold warfarin until decision is made to re-start PT-INR daily Monitor for S/S of bleeding  Despina Pole, [facility]  10/16/2018,11:28 AM

## 2018-10-16 NOTE — Progress Notes (Signed)
eLink Physician-Brief Progress Note Patient Name: Cory Ryan DOB: 1941-01-30 MRN: [id number]   Date of Service  10/16/2018  HPI/Events of Note  Called d/t elevated HR - AFIB with RVR, 12 Lead EKG reveals incomplete LBBB, ST-T changes (consider inferolateral ischemia and prolonged QT. BP = 104/67.  eICU Interventions  Will order: 1. Monitor QTc interval now and Q 4 hours. Notify MD for QTc interval > 500 milliseconds.  2. Amiodarone IV load and infusion. 3. BMP and Mg++ level STAT. 4. Cycle Troponin.      Intervention Category Major Interventions: Arrhythmia - evaluation and management  [name] 10/16/2018, 7:24 PM

## 2018-10-16 NOTE — Consult Note (Signed)
Referring Provider: No ref. provider found Primary Care Physician:  [name], MD Primary Gastroenterologist:  Dr. [name]  Reason for Consultation:  GI bleed  HPI:   78 year old gentleman with multiple comorbidities admitted 09/18/2018 with atrial fibrillation/RVR, systolic and diastolic congestive heart failure Further complicated by aspiration pneumonia and respiratory failure now on the ventilator. 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  Chronic anticoagulation with Coumadin-INR 5.82 last evening. Developed a large grossly bloody BM last night.  Vitamin K, FFP given-Coumadin held. Patient has remained stable overnight.  Hemoglobin has diminished from 12 range on admission 9.5 this morning.  Platelets 90,000 this morning.  INR 1.67 There was a sketchy report of black tarry stools on admission.  History of gastric erosions on distant EGD.  No history of peptic ulcer disease. History of metachronous colon cancer (patient ultimately ended up with a subtotal colectomy 2015). There has been no report of abdominal pain.  Patient currently intubated on propofol infusion. Gastric tube in place for enteral support. Protonix 40 mg IV every 24 hours being administered.    Past Medical History:  Diagnosis Date  . Anemia   . Anxiety   . Aortic stenosis    Moderate  . Arthritis   . Atrial fibrillation ([place])   . Chronic systolic heart failure (HCC)    LVEF 40-45%  . CKD (chronic kidney disease) stage 3, GFR 30-59 ml/min (HCC)   . Colon cancer ([place])   . Colon polyps 12/2005   Per colonoscopy  . COPD (chronic obstructive pulmonary disease) ([place])   . Coronary atherosclerosis of native coronary artery    Multivessel status post CABG  . Emphysema   . Gastric erosions 12/2005   Per EGD - Dr. [name]  . GERD (gastroesophageal reflux disease)   . Hypothyroidism   . Ischemic cardiomyopathy   . Mitral regurgitation    Moderate  . Myocardial  infarction [facility])    Remote  . Psoriasis   . Pulmonary nodules     Past Surgical History:  Procedure Laterality Date  . COLECTOMY N/A 12/13/2013   Procedure: TOTAL COLECTOMY;  Surgeon: [name], MD;  Location: [facility] ORS;  Service: General;  Laterality: N/A;  . CORONARY ARTERY BYPASS GRAFT  March 2004   LIMA to LAD, SVG to diagonal, SVG to OM 2, SVG to RCA  . FLEXIBLE SIGMOIDOSCOPY  10/25/2012   Procedure: FLEXIBLE SIGMOIDOSCOPY;  Surgeon: [name], MD;  Location: [facility] END[facility] SUITE;  Service: Gastroenterology;  Laterality: N/A;  . PARTIAL COLECTOMY  10/26/2012   Procedure: PARTIAL COLECTOMY;  Surgeon: [name], MD;  Location: [facility] ORS;  Service: General;  Laterality: N/A;  Closure of Colovesical Fistula    Prior to Admission medications   Medication Sig Start Date End Date Taking? Authorizing Provider  carvedilol (COREG) 6.25 MG tablet TAKE (1) TABLET BY MOUTH TWICE DAILY WTIH A MEAL. Patient taking differently: Take 6.25 mg by mouth 2 (two) times daily with a meal. TAKE (1) TABLET BY MOUTH TWICE DAILY WTIH A MEAL. 03/16/18  Yes [name], MD  furosemide (LASIX) 20 MG tablet TAKE 20 MG EVERY OTHER DAY ALTERNATING WITH 30 MG EVERY OTHER DAY 09/14/18  Yes [name], MD  HYDROcodone-acetaminophen (NORCO) 5-325 MG per tablet Take 1-2 tablets by mouth every 4 (four) hours as needed. Patient taking differently: Take 1 tablet by mouth every 6 (six) hours as needed for moderate pain.  12/18/13  Yes Aviva Signs, MD  Ipratropium-Albuterol (COMBIVENT RESPIMAT) 20-100 MCG/ACT AERS respimat Inhale 1 puff into the lungs as needed. Patient taking differently: Inhale 1 puff into the lungs 4 (four) times daily.  06/15/13  Yes [name], MD  loperamide (IMODIUM) 2 MG capsule Take 1 capsule (2 mg total) by mouth as needed for diarrhea or loose stools. Patient taking differently: Take 4 mg by mouth daily as needed for diarrhea or loose stools.  10/29/12  Yes Chelsea Primus, MD    potassium chloride SA (K-DUR,KLOR-CON) 20 MEQ tablet Take 0.5 tablets (10 mEq total) by mouth daily. Patient taking differently: Take 20 mEq by mouth 2 (two) times daily.  02/16/17  Yes Lendon Colonel, NP  SSD 1 % cream Apply 1 application topically 3 (three) times daily as needed. Applied to legs as needed 07/14/18  Yes [provider]  albuterol (PROVENTIL) (2.5 MG/3ML) 0.083% nebulizer solution Inhale 3 mLs into the lungs every 4 (four) hours as needed for wheezing or shortness of breath.  08/06/15   [provider]  diltiazem (CARDIZEM CD) 120 MG 24 hr capsule TAKE (1) CAPSULE BY MOUTH TWICE DAILY. 06/14/18   [name], MD  diltiazem (CARDIZEM CD) 120 MG 24 hr capsule TAKE (1) CAPSULE BY MOUTH TWICE DAILY. 09/14/18   [name], MD  vitamin B-12 (CYANOCOBALAMIN) 1000 MCG tablet Take 1 tablet (1,000 mcg total) by mouth daily. 03/16/17   Twana First, MD  warfarin (COUMADIN) 5 MG tablet Take 0.5-1 tablets (2.5-5 mg total) by mouth See admin instructions. TAKE 1/2 TABLET (2.79m total) BY MOUTH DAILY, EXCEPT TAKE 1 TABLET (522mtotal) ON MONDAYS, WEDNESDAYS AND FRIDAYS. Patient not taking: Reported on 10/09/2018 09/14/18   McSatira SarkMD    Current Facility-Administered Medications  Medication Dose Route Frequency Provider Last Rate Last Dose  . 0.9 %  sodium chloride infusion  250 mL Intravenous PRN DaDerrill Kay, MD      . 0.9 %  sodium chloride infusion   Intravenous Continuous JoWynetta EmeryClanford L, MD 50 mL/hr at 10/15/18 0848    . Ampicillin-Sulbactam (UNASYN) 3 g in sodium chloride 0.9 % 100 mL IVPB  3 g Intravenous Q12H Johnson, Clanford L, MD 200 mL/hr at 10/16/18 0403 3 g at 10/16/18 0403  . chlorhexidine gluconate (MEDLINE KIT) (PERIDEX) 0.12 % solution 15 mL  15 mL Mouth Rinse BID Johnson, Clanford L, MD   15 mL at 10/16/18 0736  . diltiazem (CARDIZEM) 100 mg in dextrose 5 % 100 mL (1 mg/mL) infusion  5-15 mg/hr Intravenous Titrated MoElmer SowMD   Stopped at 10/16/18 [id number]. feeding supplement (PRO-STAT SUGAR FREE 64) liquid 30 mL  30 mL Oral Daily Johnson, Clanford L, MD   30 mL at 10/16/18 0911  . feeding supplement (VITAL AF 1.2 CAL) liquid 1,000 mL  1,000 mL Per Tube Q24H Johnson, Clanford L, MD 40 mL/hr at 10/15/18 1354 1,000 mL at 10/15/18 1354  . fentaNYL (SUBLIMAZE) injection 50 mcg  50 mcg Intravenous Q15 min PRN HaSinda DuMD   50 mcg at 10/14/18 2242  . fentaNYL (SUBLIMAZE) injection 50 mcg  50 mcg Intravenous Q2H PRN HaSinda DuMD      . HYDROcodone-acetaminophen (NORCO/VICODIN) 5-325 MG per tablet 1 tablet  1 tablet Oral Q6H PRN DaPhillips GroutMD   1 tablet at 10/14/18 0313  . Influenza vac split quadrivalent PF (FLUZONE HIGH-DOSE) injection 0.5 mL  0.5 mL Intramuscular Tomorrow-1000 OrReubin MilanMD      .  insulin aspart (novoLOG) injection 0-9 Units  0-9 Units Subcutaneous Q4H Wynetta Emery, Clanford L, MD   3 Units at 10/16/18 0734  . insulin glargine (LANTUS) injection 12 Units  12 Units Subcutaneous QHS Irwin Brakeman L, MD   12 Units at 10/15/18 2140  . ipratropium (ATROVENT) nebulizer solution 0.5 mg  0.5 mg Nebulization Q6H Reubin Milan, MD   0.5 mg at 10/16/18 5038  . ketoconazole (NIZORAL) 2 % cream   Topical BID Johnson, Clanford L, MD      . levalbuterol Penne Lash) nebulizer solution 0.63 mg  0.63 mg Nebulization Q6H PRN Reubin Milan, MD      . levalbuterol [facility]) nebulizer solution 0.63 mg  0.63 mg Nebulization Q6H Reubin Milan, MD   0.63 mg at 10/16/18 8828  . LORazepam (ATIVAN) injection 0.5 mg  0.5 mg Intravenous Q4H PRN Johnson, Clanford L, MD   0.5 mg at 10/13/18 1200  . MEDLINE mouth rinse  15 mL Mouth Rinse 10 times per day Wynetta Emery, Clanford L, MD   15 mL at 10/16/18 0943  . methylPREDNISolone sodium succinate (SOLU-MEDROL) 40 mg/mL injection 40 mg  40 mg Intravenous Q6H Johnson, Clanford L, MD   40 mg at 10/16/18 0911  . metoprolol tartrate (LOPRESSOR) injection  5 mg  5 mg Intravenous Q6H Johnson, Clanford L, MD   5 mg at 10/16/18 0526  . pantoprazole (PROTONIX) injection 40 mg  40 mg Intravenous Q24H Johnson, Clanford L, MD   40 mg at 10/16/18 0734  . propofol (DIPRIVAN) 1000 MG/100ML infusion  5-80 mcg/kg/min Intravenous Titrated Johnson, Clanford L, MD 11.74 mL/hr at 10/16/18 0403 30 mcg/kg/min at 10/16/18 0403  . sodium chloride flush (NS) 0.9 % injection 3 mL  3 mL Intravenous Q12H Derrill Kay A, MD   3 mL at 10/15/18 2131  . sodium chloride flush (NS) 0.9 % injection 3 mL  3 mL Intravenous PRN Phillips Grout, MD      . Warfarin - Pharmacist Dosing Inpatient   Does not apply Q24H Murlean Iba, MD        Allergies as of 09/20/2018  . (No Known Allergies)    Family History  Problem Relation Age of Onset  . Cancer Brother   . Heart Problems Mother        unknown  . Hypertension Other     Social History   Socioeconomic History  . Marital status: Widowed    Spouse name: Not on file  . Number of children: Not on file  . Years of education: Not on file  . Highest education level: Not on file  Occupational History  . Not on file  Social Needs  . Financial resource strain: Not on file  . Food insecurity:    Worry: Not on file    Inability: Not on file  . Transportation needs:    Medical: Not on file    Non-medical: Not on file  Tobacco Use  . Smoking status: Former Smoker    Packs/day: 1.00    Years: 30.00    Pack years: 30.00    Types: Cigarettes    Last attempt to quit: 03/17/2003    Years since quitting: 15.5  . Smokeless tobacco: Never Used  Substance and Sexual Activity  . Alcohol use: No    Alcohol/week: 0.0 standard drinks    Comment: no alcohol use since '04  . Drug use: No  . Sexual activity: Not Currently  Lifestyle  . Physical activity:  Days per week: Not on file    Minutes per session: Not on file  . Stress: Not on file  Relationships  . Social connections:    Talks on phone: Not on file    Gets  together: Not on file    Attends religious service: Not on file    Active member of club or organization: Not on file    Attends meetings of clubs or organizations: Not on file    Relationship status: Not on file  . Intimate partner violence:    Fear of current or ex partner: Not on file    Emotionally abused: Not on file    Physically abused: Not on file    Forced sexual activity: Not on file  Other Topics Concern  . Not on file  Social History Narrative  . Not on file    Review of Systems:  As in HPI  Physical Exam: Vital signs in last 24 hours: Temp:  [97.4 F (36.3 C)-99.2 F (37.3 C)] 97.8 F (36.6 C) (01/05 0716) Pulse Rate:  [27-117] 99 (01/05 0716) Resp:  [0-31] 18 (01/05 0716) BP: (88-121)/(46-84) 112/60 (01/05 0638) SpO2:  [87 %-100 %] 99 % (01/05 0716) FiO2 (%):  [35 %] 35 % (01/05 0612) Weight:  [67 kg] 67 kg (01/05 0453) Last BM Date: 09/12/2018 General: Unresponsive.  Intubated /propofol on board.  Squeaky upper airway sounds Head:  Normocephalic and atraumatic. Neck:  Supple; no masses or thyromegaly. Abdomen: Nondistended.  Midline surgical scar positive bowel sounds soft no apparent mass organomegaly  Intake/Output from previous day: 01/04 0701 - 01/05 0700 In: 4505.2 [I.V.:924.1; Blood:1429; NG/GT:1045.2; IV Piggyback:867] Out: 975 [Urine:975] Intake/Output this shift: No intake/output data recorded.  Lab Results: Recent Labs    10/14/18 0436 10/16/18 0025 10/16/18 0733  WBC 3.0* 10.3 6.7  HGB 11.0* 10.3* 9.5*  HCT 34.7* 33.5* 30.8*  PLT 65* 110* 90*   BMET Recent Labs    10/15/18 0346 10/16/18 0025 10/16/18 0733  NA 137 141 141  K 3.8 4.0 3.4*  CL 105 109 108  CO2 21* 22 24  GLUCOSE 246* 225* 225*  BUN 57* 80* 80*  CREATININE 2.31* 2.58* 2.30*  CALCIUM 7.9* 7.6* 7.7*   LFT Recent Labs    10/16/18 0025 10/16/18 0733  PROT 6.3*  --   ALBUMIN 3.0* 3.2*  AST 22  --   ALT 10  --   ALKPHOS 62  --   BILITOT 0.7  --     PT/INR Recent Labs    10/16/18 0025 10/16/18 0733  LABPROT 51.3* 19.5*  INR 5.82* 1.67   Hepatitis Panel No results for input(s): HEPBSAG, HCVAB, HEPAIGM, HEPBIGM in the last 72 hours. C-Diff No results for input(s): CDIFFTOX in the last 72 hours.  Studies/Results: Dg Chest Port 1 View  Result Date: 10/16/2018 CLINICAL DATA:  Respiratory failure. EXAM: PORTABLE CHEST 1 VIEW COMPARISON:  10/15/2018 FINDINGS: The ET tube tip is above the carina. There is an enteric tube with tip below the level of the GE junction. Bilateral pleural effusions and moderate interstitial edema noted. Left lung base opacity is increased from previous exam. IMPRESSION: 1. Worsening aeration to the left lung base. 2. CHF, unchanged. Electronically Signed   By: Kerby Moors M.D.   On: 10/16/2018 07:46   Dg Chest Port 1 View  Result Date: 10/15/2018 CLINICAL DATA:  Initial evaluation for acute respiratory failure, fluid overload. EXAM: PORTABLE CHEST 1 VIEW COMPARISON:  Prior radiograph from 10/13/2018. FINDINGS:  Endotracheal tube in place with tip position 5.7 cm above the carina. Enteric tube courses into the abdomen. Median sternotomy wires with underlying CABG markers noted. Stable cardiomegaly. Mediastinal silhouette normal. Lungs mildly hypoinflated. Persistent interstitial vascular congestion with small bilateral pleural effusions. Superimposed hazy bibasilar opacities favored to reflect edema and/or atelectasis, although infiltrates would be difficult to exclude. No pneumothorax. No acute osseous abnormality. IMPRESSION: 1. Support apparatus in satisfactory position. 2. Persistent mild diffuse pulmonary interstitial congestion/edema with small bilateral pleural effusions. 3. Superimposed hazy bibasilar opacities, favored to reflect atelectasis and/or edema. Superimposed infiltrates could be considered in the correct clinical setting. Electronically Signed   By: Jeannine Boga M.D.   On: 10/15/2018 03:56    Impression: Unfortunate 78 year old with multiple comorbidities including atrial fibrillation on chronic anticoagulation, heart failure, advanced chronic kidney disease complicated further by aspiration pneumonia, respiratory failure and now GI bleeding. Coagulopathic recently on Coumadin.  Chronic thrombocytopenia. Bleeding seems to have slowed.  FFP and vitamin K administered.  He remains hemodynamically stable. GI bleeding may have occurred from any number of processes anywhere along the GI tract in the setting of impaired clotting ability.   His prognosis at this point time appears poor.  He is not a candidate for any type of endoscopic intervention.    Recommendations:  Supportive.  Continue PPI daily.  Follow H&H.  I do not recommend reinstitution of anticoagulation therapy at this time as the risks likely outweigh the benefits.   No family in attendance at the time of my visit with the patient. We will reassess tomorrow morning.     Notice:  This dictation was prepared with Dragon dictation along with smaller phrase technology. Any transcriptional errors that result from this process are unintentional and may not be corrected upon review.

## 2018-10-16 NOTE — Progress Notes (Addendum)
Pharmacy Antibiotic Note  Today is day #4 of Unasyn therapy for this 78 yo male being treated for aspiration pneumonia. WBC count is wnl and he's  currently afebrile.  Renal function is currently unstable, with SCr trending up over the last several days.  Plan: Continue Unasyn 3gm IV q12h (f/u transition to oral) Monitor labs, micro and vitals.   Height: 5\' 10"  (177.8 cm) Weight: 147 lb 11.3 oz (67 kg) IBW/kg (Calculated) : 73  Temp (24hrs), Avg:98.1 F (36.7 C), Min:97.4 F (36.3 C), Max:99.2 F (37.3 C)  Recent Labs  Lab 10/02/2018 1155 09/11/2018 1202 09/20/2018 1309 10/12/18 0331 10/13/18 0547 10/14/18 0436 10/15/18 0346 10/16/18 0025 10/16/18 0733  WBC 5.4  --   --  6.3  --  3.0*  --  10.3 6.7  CREATININE 1.55*  --   --  1.34* 1.43* 1.90* 2.31* 2.58* 2.30*  LATICACIDVEN  --  1.98* 1.52  --   --   --   --   --   --     Estimated Creatinine Clearance: 24.7 mL/min (A) (by C-G formula based on SCr of 2.3 mg/dL (H)).    No Known Allergies  Antimicrobials this admission: Augmentin 1/2 x 1 Unasyn 1/2 >>   Dose adjustments this admission: Unasyn 3g IV q8h adjusted to 3g IV q12h   Microbiology results: 1/2 MRSA PCR: pending  Thank you for allowing pharmacy to be a part of this patient's care.  Despina Pole 10/16/2018 11:17 AM

## 2018-10-16 NOTE — Progress Notes (Signed)
While rounding on patient to check his CBG, pulled the sheets back and found the patient had passed large amounts of bright red, frank blood per rectum. Notified Dr. Olevia Bowens who came to check patient. Patient has been cleaned and sheets changed x 3 and is still passing large amounts of blood. Dr. Olevia Bowens ordered a fluid bolus and 3 units plasma. Family was notified of change in condition.

## 2018-10-17 ENCOUNTER — Inpatient Hospital Stay (HOSPITAL_COMMUNITY): Payer: Medicare Other

## 2018-10-17 DIAGNOSIS — N183 Chronic kidney disease, stage 3 (moderate): Secondary | ICD-10-CM

## 2018-10-17 DIAGNOSIS — R791 Abnormal coagulation profile: Secondary | ICD-10-CM

## 2018-10-17 DIAGNOSIS — J189 Pneumonia, unspecified organism: Secondary | ICD-10-CM

## 2018-10-17 DIAGNOSIS — I5041 Acute combined systolic (congestive) and diastolic (congestive) heart failure: Secondary | ICD-10-CM

## 2018-10-17 DIAGNOSIS — I350 Nonrheumatic aortic (valve) stenosis: Secondary | ICD-10-CM

## 2018-10-17 LAB — PROTIME-INR
INR: 1.2
PROTHROMBIN TIME: 15.1 s (ref 11.4–15.2)

## 2018-10-17 LAB — GLUCOSE, CAPILLARY
GLUCOSE-CAPILLARY: 191 mg/dL — AB (ref 70–99)
GLUCOSE-CAPILLARY: 185 mg/dL — AB (ref 70–99)
Glucose-Capillary: 186 mg/dL — ABNORMAL HIGH (ref 70–99)
Glucose-Capillary: 190 mg/dL — ABNORMAL HIGH (ref 70–99)
Glucose-Capillary: 232 mg/dL — ABNORMAL HIGH (ref 70–99)
Glucose-Capillary: 232 mg/dL — ABNORMAL HIGH (ref 70–99)

## 2018-10-17 LAB — PREPARE FRESH FROZEN PLASMA: UNIT DIVISION: 0

## 2018-10-17 LAB — RENAL FUNCTION PANEL
Albumin: 3.1 g/dL — ABNORMAL LOW (ref 3.5–5.0)
Anion gap: 8 (ref 5–15)
BUN: 81 mg/dL — ABNORMAL HIGH (ref 8–23)
CALCIUM: 8.1 mg/dL — AB (ref 8.9–10.3)
CO2: 24 mmol/L (ref 22–32)
Chloride: 111 mmol/L (ref 98–111)
Creatinine, Ser: 1.77 mg/dL — ABNORMAL HIGH (ref 0.61–1.24)
GFR calc Af Amer: 41 mL/min — ABNORMAL LOW (ref >60)
GFR calc non Af Amer: 36 mL/min — ABNORMAL LOW (ref >60)
Glucose, Bld: 198 mg/dL — ABNORMAL HIGH (ref 70–99)
Phosphorus: 3.2 mg/dL (ref 2.5–4.6)
Potassium: 4 mmol/L (ref 3.5–5.1)
Sodium: 143 mmol/L (ref 135–145)

## 2018-10-17 LAB — BLOOD GAS, ARTERIAL
ACID-BASE EXCESS: 3.4 mmol/L — AB (ref 0.0–2.0)
Bicarbonate: 27.7 mmol/L (ref 20.0–28.0)
DRAWN BY: 2223
FIO2: 35
MECHVT: 540 mL
O2 Saturation: 97.9 %
PEEP: 5 cmH2O
PH ART: 7.483 — AB (ref 7.350–7.450)
Patient temperature: 37.8
RATE: 16 resp/min
pCO2 arterial: 36.1 mmHg (ref 32.0–48.0)
pO2, Arterial: 102 mmHg (ref 83.0–108.0)

## 2018-10-17 LAB — CBC
HCT: 30.1 % — ABNORMAL LOW (ref 39.0–52.0)
Hemoglobin: 9.2 g/dL — ABNORMAL LOW (ref 13.0–17.0)
MCH: 29.6 pg (ref 26.0–34.0)
MCHC: 30.6 g/dL (ref 30.0–36.0)
MCV: 96.8 fL (ref 80.0–100.0)
Platelets: 90 10*3/uL — ABNORMAL LOW (ref 150–400)
RBC: 3.11 MIL/uL — AB (ref 4.22–5.81)
RDW: 14.1 % (ref 11.5–15.5)
WBC: 7.4 10*3/uL (ref 4.0–10.5)
nRBC: 0 % (ref 0.0–0.2)

## 2018-10-17 LAB — BPAM FFP
Blood Product Expiration Date: 202001102359
ISSUE DATE / TIME: 202001050303
Unit Type and Rh: 5100

## 2018-10-17 LAB — TRIGLYCERIDES: Triglycerides: 71 mg/dL (ref <150)

## 2018-10-17 LAB — TROPONIN I
Troponin I: 0.09 ng/mL (ref <0.03)
Troponin I: 0.05 ng/mL (ref <0.03)

## 2018-10-17 MED ORDER — INFLUENZA VAC SPLIT HIGH-DOSE 0.5 ML IM SUSY
0.5000 mL | PREFILLED_SYRINGE | INTRAMUSCULAR | Status: DC
  Filled 2018-10-17: qty 0.5

## 2018-10-17 MED ORDER — DEXTROSE-NACL 5-0.9 % IV SOLN
INTRAVENOUS | Status: DC
  Administered 2018-10-17: 01:00:00 via INTRAVENOUS

## 2018-10-17 MED ORDER — ACETAMINOPHEN 325 MG PO TABS
650.0000 mg | ORAL_TABLET | Freq: Four times a day (QID) | ORAL | Status: DC | PRN
  Administered 2018-10-17 – 2018-10-20 (×7): 650 mg via ORAL
  Filled 2018-10-17 (×7): qty 2

## 2018-10-17 NOTE — Progress Notes (Signed)
Subjective: He has significant issues last night with tachycardia and is now on amiodarone.  QT interval was prolonged and it looks like he had atrial fib with RVR and incomplete left bundle branch block with ST-T wave changes.  His potassium was borderline and replaced.  His troponin level was elevated.  He had more fever and blood culture and tracheal aspirate were done and are still pending  Objective: Vital signs in last 24 hours: Temp:  [98.8 F (37.1 C)] 98.8 F (37.1 C) (01/05 1645) Pulse Rate:  [37-126] 100 (01/06 0500) Resp:  [16-24] 19 (01/06 0500) BP: (97-129)/(53-73) 110/53 (01/06 0500) SpO2:  [95 %-99 %] 98 % (01/06 0637) FiO2 (%):  [35 %] 35 % (01/06 0637) Weight change:  Last BM Date: 10/16/18  Intake/Output from previous day: 01/05 0701 - 01/06 0700 In: 2845.3 [I.V.:1648.3; NG/GT:794.8; IV Piggyback:282.1] Out: 900 [Urine:900]  PHYSICAL EXAM General appearance: Intubated sedated on mechanical ventilation Resp: He has bilateral rhonchi left more than right Cardio: irregularly irregular rhythm GI: soft, non-tender; bowel sounds normal; no masses,  no organomegaly Extremities: Chronic discoloration of both legs  Lab Results:  Results for orders placed or performed during the hospital encounter of 09/19/2018 (from the past 48 hour(s))  Glucose, capillary     Status: Abnormal   Collection Time: 10/15/18 11:33 AM  Result Value Ref Range   Glucose-Capillary 219 (H) 70 - 99 mg/dL  Glucose, capillary     Status: Abnormal   Collection Time: 10/15/18  4:16 PM  Result Value Ref Range   Glucose-Capillary 230 (H) 70 - 99 mg/dL  Glucose, capillary     Status: Abnormal   Collection Time: 10/15/18  7:56 PM  Result Value Ref Range   Glucose-Capillary 140 (H) 70 - 99 mg/dL  Prepare fresh frozen plasma     Status: None (Preliminary result)   Collection Time: 10/16/18 12:19 AM  Result Value Ref Range   Unit Number [id number]    Blood Component Type THW PLS APHR    Unit  division A0    Status of Unit ISSUED    Transfusion Status OK TO TRANSFUSE    Unit Number [id number]    Blood Component Type THAWED PLASMA    Unit division 00    Status of Unit ISSUED    Transfusion Status      OK TO TRANSFUSE Performed at [facility], [address] [place], [place] [address]   CBC with Differential/Platelet     Status: Abnormal   Collection Time: 10/16/18 12:25 AM  Result Value Ref Range   WBC 10.3 4.0 - 10.5 K/uL   RBC 3.49 (L) 4.22 - 5.81 MIL/uL   Hemoglobin 10.3 (L) 13.0 - 17.0 g/dL   HCT 33.5 (L) 39.0 - 52.0 %   MCV 96.0 80.0 - 100.0 fL   MCH 29.5 26.0 - 34.0 pg   MCHC 30.7 30.0 - 36.0 g/dL   RDW 14.1 11.5 - 15.5 %   Platelets 110 (L) 150 - 400 K/uL    Comment: SPECIMEN CHECKED FOR CLOTS CONSISTENT WITH PREVIOUS RESULT    nRBC 0.0 0.0 - 0.2 %   Neutrophils Relative % 94 %   Neutro Abs 9.6 (H) 1.7 - 7.7 K/uL   Lymphocytes Relative 3 %   Lymphs Abs 0.3 (L) 0.7 - 4.0 K/uL   Monocytes Relative 2 %   Monocytes Absolute 0.3 0.1 - 1.0 K/uL   Eosinophils Relative 0 %   Eosinophils Absolute 0.0 0.0 - 0.5 K/uL  Basophils Relative 0 %   Basophils Absolute 0.0 0.0 - 0.1 K/uL   Immature Granulocytes 1 %   Abs Immature Granulocytes 0.12 (H) 0.00 - 0.07 K/uL    Comment: Performed at [facility], [address] [place], [place] [address]  Type and screen [facility]     Status: None   Collection Time: 10/16/18 12:25 AM  Result Value Ref Range   ABO/RH(D) O POS    Antibody Screen NEG    Sample Expiration      10/19/2018 Performed at [facility], [address] [place], [place] [address]   Comprehensive metabolic panel     Status: Abnormal   Collection Time: 10/16/18 12:25 AM  Result Value Ref Range   Sodium 141 135 - 145 mmol/L   Potassium 4.0 3.5 - 5.1 mmol/L   Chloride 109 98 - 111 mmol/L   CO2 22 22 - 32 mmol/L   Glucose, Bld 225 (H) 70 - 99 mg/dL   BUN 80 (H) 8 - 23 mg/dL   Creatinine, Ser 2.58 (H) 0.61 - 1.24 mg/dL   Calcium 7.6 (L)  8.9 - 10.3 mg/dL   Total Protein 6.3 (L) 6.5 - 8.1 g/dL   Albumin 3.0 (L) 3.5 - 5.0 g/dL   AST 22 15 - 41 U/L   ALT 10 0 - 44 U/L   Alkaline Phosphatase 62 38 - 126 U/L   Total Bilirubin 0.7 0.3 - 1.2 mg/dL   GFR calc non Af Amer 23 (L) >60 mL/min   GFR calc Af Amer 26 (L) >60 mL/min   Anion gap 10 5 - 15    Comment: Performed at [facility], [address] [place], [place] [address]  Protime-INR     Status: Abnormal   Collection Time: 10/16/18 12:25 AM  Result Value Ref Range   Prothrombin Time 51.3 (H) 11.4 - 15.2 seconds   INR 5.82 (HH)     Comment: REPEATED TO VERIFY CRITICAL RESULT CALLED TO, READ BACK BY AND VERIFIED WITH: JERFFERSON,V. AT 6295 ON 10/16/2018 BY EVA Performed at [facility], [address], [place], [place] [address]   Hemoglobin A1c     Status: None   Collection Time: 10/16/18 12:25 AM  Result Value Ref Range   Hgb A1c MFr Bld 5.1 4.8 - 5.6 %    Comment: (NOTE) Pre diabetes:          5.7%-6.4% Diabetes:              >6.4% Glycemic control for   <7.0% adults with diabetes    Mean Plasma Glucose 99.67 mg/dL    Comment: Performed at [facility], [place] [address] [place], [place] [address]  Prepare fresh frozen plasma     Status: None (Preliminary result)   Collection Time: 10/16/18  1:21 AM  Result Value Ref Range   Unit Number [id number]    Blood Component Type THAWED PLASMA    Unit division 00    Status of Unit ISSUED    Transfusion Status      OK TO TRANSFUSE Performed at [facility], [address], [place], [place] [address]   Blood gas, arterial     Status: Abnormal   Collection Time: 10/16/18  3:20 AM  Result Value Ref Range   FIO2 35.00    Delivery systems VENTILATOR    Mode PRESSURE REGULATED VOLUME CONTROL    VT 540 mL   LHR 16 resp/min   Peep/cpap 5.0 cm H20  pH, Arterial 7.427 7.350 - 7.450   pCO2 arterial 36.5 32.0 - 48.0 mmHg   pO2, Arterial 104.0 83.0 - 108.0 mmHg   Bicarbonate 24.5 20.0 - 28.0 mmol/L   Acid-base  deficit 0.1 0.0 - 2.0 mmol/L   O2 Saturation 98.1 %   Collection site RIGHT BRACHIAL    Drawn by [id number]    Sample type ARTERIAL    Allens test (pass/fail) NOT INDICATED (A) PASS    Comment: Performed at [facility] [address], [place], [place] [address]  Glucose, capillary     Status: Abnormal   Collection Time: 10/16/18  3:54 AM  Result Value Ref Range   Glucose-Capillary 221 (H) 70 - 99 mg/dL  Glucose, capillary     Status: Abnormal   Collection Time: 10/16/18  7:18 AM  Result Value Ref Range   Glucose-Capillary 216 (H) 70 - 99 mg/dL  Protime-INR     Status: Abnormal   Collection Time: 10/16/18  7:33 AM  Result Value Ref Range   Prothrombin Time 19.5 (H) 11.4 - 15.2 seconds   INR 1.67     Comment: Performed at [facility], [address], [place], [place] [address]  Renal function panel     Status: Abnormal   Collection Time: 10/16/18  7:33 AM  Result Value Ref Range   Sodium 141 135 - 145 mmol/L   Potassium 3.4 (L) 3.5 - 5.1 mmol/L   Chloride 108 98 - 111 mmol/L   CO2 24 22 - 32 mmol/L   Glucose, Bld 225 (H) 70 - 99 mg/dL   BUN 80 (H) 8 - 23 mg/dL   Creatinine, Ser 2.30 (H) 0.61 - 1.24 mg/dL   Calcium 7.7 (L) 8.9 - 10.3 mg/dL   Phosphorus 4.6 2.5 - 4.6 mg/dL   Albumin 3.2 (L) 3.5 - 5.0 g/dL   GFR calc non Af Amer 26 (L) >60 mL/min   GFR calc Af Amer 30 (L) >60 mL/min   Anion gap 9 5 - 15    Comment: Performed at [facility], [address], [place], [place] [address]  CBC     Status: Abnormal   Collection Time: 10/16/18  7:33 AM  Result Value Ref Range   WBC 6.7 4.0 - 10.5 K/uL   RBC 3.19 (L) 4.22 - 5.81 MIL/uL   Hemoglobin 9.5 (L) 13.0 - 17.0 g/dL   HCT 30.8 (L) 39.0 - 52.0 %   MCV 96.6 80.0 - 100.0 fL   MCH 29.8 26.0 - 34.0 pg   MCHC 30.8 30.0 - 36.0 g/dL   RDW 14.2 11.5 - 15.5 %   Platelets 90 (L) 150 - 400 K/uL    Comment: REPEATED TO VERIFY SPECIMEN CHECKED FOR CLOTS Immature Platelet Fraction may be clinically indicated, consider ordering this  additional test [id number]    nRBC 0.0 0.0 - 0.2 %    Comment: Performed at [facility], [address] [place], [place] [address]  Glucose, capillary     Status: Abnormal   Collection Time: 10/16/18 11:12 AM  Result Value Ref Range   Glucose-Capillary 146 (H) 70 - 99 mg/dL  Triglycerides     Status: None   Collection Time: 10/16/18  1:03 PM  Result Value Ref Range   Triglycerides 76 <150 mg/dL    Comment: Performed at [facility], [address], [place], [place] [address]  Glucose, capillary     Status: Abnormal   Collection Time: 10/16/18  4:44 PM  Result Value Ref Range  Glucose-Capillary 198 (H) 70 - 99 mg/dL  Glucose, capillary     Status: Abnormal   Collection Time: 10/16/18  7:35 PM  Result Value Ref Range   Glucose-Capillary 212 (H) 70 - 99 mg/dL   Comment 1 Notify RN    Comment 2 Document in Chart   Basic metabolic panel     Status: Abnormal   Collection Time: 10/16/18  7:55 PM  Result Value Ref Range   Sodium 145 135 - 145 mmol/L   Potassium 3.7 3.5 - 5.1 mmol/L   Chloride 113 (H) 98 - 111 mmol/L   CO2 23 22 - 32 mmol/L   Glucose, Bld 228 (H) 70 - 99 mg/dL   BUN 82 (H) 8 - 23 mg/dL   Creatinine, Ser 1.96 (H) 0.61 - 1.24 mg/dL   Calcium 7.9 (L) 8.9 - 10.3 mg/dL   GFR calc non Af Amer 32 (L) >60 mL/min   GFR calc Af Amer 37 (L) >60 mL/min   Anion gap 9 5 - 15    Comment: Performed at [facility], [address], [place], [place] [address]  Magnesium     Status: Abnormal   Collection Time: 10/16/18  7:55 PM  Result Value Ref Range   Magnesium 2.9 (H) 1.7 - 2.4 mg/dL    Comment: Performed at [facility], [address] [place], [place] [address]  Troponin I - Now Then Q6H     Status: Abnormal   Collection Time: 10/16/18  7:55 PM  Result Value Ref Range   Troponin I 0.08 (HH) <0.03 ng/mL    Comment: CRITICAL RESULT CALLED TO, READ BACK BY AND VERIFIED WITH: EVANS,H. AT 2054 ON 10/16/2018 BY EVA Performed at [facility], [address], [place], [place]  [address]   CBC with Differential/Platelet     Status: Abnormal   Collection Time: 10/16/18  7:56 PM  Result Value Ref Range   WBC 7.3 4.0 - 10.5 K/uL   RBC 3.21 (L) 4.22 - 5.81 MIL/uL   Hemoglobin 9.4 (L) 13.0 - 17.0 g/dL   HCT 31.3 (L) 39.0 - 52.0 %   MCV 97.5 80.0 - 100.0 fL   MCH 29.3 26.0 - 34.0 pg   MCHC 30.0 30.0 - 36.0 g/dL   RDW 14.3 11.5 - 15.5 %   Platelets 93 (L) 150 - 400 K/uL    Comment: SPECIMEN CHECKED FOR CLOTS   nRBC 0.0 0.0 - 0.2 %   Neutrophils Relative % 90 %   Neutro Abs 6.6 1.7 - 7.7 K/uL   Lymphocytes Relative 3 %   Lymphs Abs 0.2 (L) 0.7 - 4.0 K/uL   Monocytes Relative 4 %   Monocytes Absolute 0.3 0.1 - 1.0 K/uL   Eosinophils Relative 3 %   Eosinophils Absolute 0.2 0.0 - 0.5 K/uL   Basophils Relative 0 %   Basophils Absolute 0.0 0.0 - 0.1 K/uL   Immature Granulocytes 0 %   Abs Immature Granulocytes 0.03 0.00 - 0.07 K/uL    Comment: Performed at [facility], [address] [place], [place] [address]  Hepatic function panel     Status: Abnormal   Collection Time: 10/16/18  7:56 PM  Result Value Ref Range   Total Protein 6.2 (L) 6.5 - 8.1 g/dL   Albumin 3.2 (L) 3.5 - 5.0 g/dL   AST 32 15 - 41 U/L   ALT 17 0 - 44 U/L   Alkaline Phosphatase 56 38 - 126 U/L   Total Bilirubin 0.9 0.3 -  1.2 mg/dL   Bilirubin, Direct 0.3 (H) 0.0 - 0.2 mg/dL   Indirect Bilirubin 0.6 0.3 - 0.9 mg/dL    Comment: Performed at [facility], [address] [place], [place] [address]  Glucose, capillary     Status: Abnormal   Collection Time: 10/17/18 12:01 AM  Result Value Ref Range   Glucose-Capillary 186 (H) 70 - 99 mg/dL   Comment 1 Notify RN    Comment 2 Document in Chart   Culture, blood (routine x 2)     Status: None (Preliminary result)   Collection Time: 10/17/18  1:43 AM  Result Value Ref Range   Specimen Description BLOOD LEFT HAND    Special Requests      BOTTLES DRAWN AEROBIC AND ANAEROBIC Blood Culture adequate volume Performed at [facility], [address], [place], [place] [address]    Culture PENDING    Report Status PENDING   Troponin I - Now Then Q6H     Status: Abnormal   Collection Time: 10/17/18  1:46 AM  Result Value Ref Range   Troponin I 0.09 (HH) <0.03 ng/mL    Comment: CRITICAL VALUE NOTED.  VALUE IS CONSISTENT WITH PREVIOUSLY REPORTED AND CALLED VALUE. Performed at [facility], [address] [place], [place] [address]   Culture, blood (routine x 2)     Status: None (Preliminary result)   Collection Time: 10/17/18  1:46 AM  Result Value Ref Range   Specimen Description BLOOD RIGHT HAND    Special Requests      BOTTLES DRAWN AEROBIC AND ANAEROBIC Blood Culture results may not be optimal due to an excessive volume of blood received in culture bottles Performed at [facility], [address], [place], [place] [address]    Culture PENDING    Report Status PENDING   Protime-INR     Status: None   Collection Time: 10/17/18  1:47 AM  Result Value Ref Range   Prothrombin Time 15.1 11.4 - 15.2 seconds   INR 1.20     Comment: Performed at [facility], [address], [place], [place] [address]  Renal function panel     Status: Abnormal   Collection Time: 10/17/18  1:47 AM  Result Value Ref Range   Sodium 143 135 - 145 mmol/L   Potassium 4.0 3.5 - 5.1 mmol/L   Chloride 111 98 - 111 mmol/L   CO2 24 22 - 32 mmol/L   Glucose, Bld 198 (H) 70 - 99 mg/dL   BUN 81 (H) 8 - 23 mg/dL   Creatinine, Ser 1.77 (H) 0.61 - 1.24 mg/dL   Calcium 8.1 (L) 8.9 - 10.3 mg/dL   Phosphorus 3.2 2.5 - 4.6 mg/dL   Albumin 3.1 (L) 3.5 - 5.0 g/dL   GFR calc non Af Amer 36 (L) >60 mL/min   GFR calc Af Amer 41 (L) >60 mL/min   Anion gap 8 5 - 15    Comment: Performed at [facility], [address], [place], [place] [address]  CBC     Status: Abnormal   Collection Time: 10/17/18  1:47 AM  Result Value Ref Range   WBC 7.4 4.0 - 10.5 K/uL   RBC 3.11 (L) 4.22 - 5.81 MIL/uL   Hemoglobin 9.2 (L) 13.0 - 17.0 g/dL   HCT 30.1 (L) 39.0 - 52.0 %   MCV 96.8  80.0 - 100.0 fL   MCH 29.6 26.0 - 34.0 pg   MCHC 30.6 30.0 - 36.0 g/dL   RDW 14.1  11.5 - 15.5 %   Platelets 90 (L) 150 - 400 K/uL    Comment: SPECIMEN CHECKED FOR CLOTS   nRBC 0.0 0.0 - 0.2 %    Comment: Performed at [facility], [address], [place], [place] [address]  Triglycerides     Status: None   Collection Time: 10/17/18  1:52 AM  Result Value Ref Range   Triglycerides 71 <150 mg/dL    Comment: Performed at [facility], [address], [place], [place] [address]  Blood gas, arterial     Status: Abnormal   Collection Time: 10/17/18  3:45 AM  Result Value Ref Range   FIO2 35.00    Delivery systems VENTILATOR    Mode PRESSURE REGULATED VOLUME CONTROL    VT 540 mL   LHR 16 resp/min   Peep/cpap 5.0 cm H20   pH, Arterial 7.483 (H) 7.350 - 7.450   pCO2 arterial 36.1 32.0 - 48.0 mmHg   pO2, Arterial 102 83.0 - 108.0 mmHg   Bicarbonate 27.7 20.0 - 28.0 mmol/L   Acid-Base Excess 3.4 (H) 0.0 - 2.0 mmol/L   O2 Saturation 97.9 %   Patient temperature 37.8    Collection site RIGHT RADIAL    Drawn by [id number]    Sample type ARTERIAL    Allens test (pass/fail) PASS PASS    Comment: Performed at [facility], [address] [place], [place] [address]  Glucose, capillary     Status: Abnormal   Collection Time: 10/17/18  4:41 AM  Result Value Ref Range   Glucose-Capillary 191 (H) 70 - 99 mg/dL    ABGS Recent Labs    10/17/18 0345  PHART 7.483*  PO2ART 102  HCO3 27.7   CULTURES Recent Results (from the past 240 hour(s))  MRSA PCR Screening     Status: None   Collection Time: 10/12/18  5:36 AM  Result Value Ref Range Status   MRSA by PCR NEGATIVE NEGATIVE Final    Comment:        The GeneXpert MRSA Assay (FDA approved for NASAL specimens only), is one component of a comprehensive MRSA colonization surveillance program. It is not intended to diagnose MRSA infection nor to guide or monitor treatment for MRSA infections. Performed at [facility], [address],  [place], [place] [address]   Culture, blood (routine x 2)     Status: None (Preliminary result)   Collection Time: 10/17/18  1:43 AM  Result Value Ref Range Status   Specimen Description BLOOD LEFT HAND  Final   Special Requests   Final    BOTTLES DRAWN AEROBIC AND ANAEROBIC Blood Culture adequate volume Performed at [facility], [address] [place], [place] [address]    Culture PENDING  Incomplete   Report Status PENDING  Incomplete  Culture, blood (routine x 2)     Status: None (Preliminary result)   Collection Time: 10/17/18  1:46 AM  Result Value Ref Range Status   Specimen Description BLOOD RIGHT HAND  Final   Special Requests   Final    BOTTLES DRAWN AEROBIC AND ANAEROBIC Blood Culture results may not be optimal due to an excessive volume of blood received in culture bottles Performed at [facility], [address], [place], [place] [address]    Culture PENDING  Incomplete   Report Status PENDING  Incomplete   Studies/Results: Dg Chest Port 1 View  Result Date: 10/17/2018 CLINICAL DATA:  Ventilator support EXAM: PORTABLE CHEST 1 VIEW COMPARISON:  10/16/2018 FINDINGS: Endotracheal tube  tip is 4 cm above the carina. Nasogastric tube enters the stomach. Edema pattern persists, with left lower lobe collapse and a small left effusion. IMPRESSION: Persistent edema pattern with left lower lobe collapse and left effusion. Electronically Signed   By: Nelson Chimes M.D.   On: 10/17/2018 06:55   Dg Chest Port 1 View  Result Date: 10/16/2018 CLINICAL DATA:  Respiratory failure. EXAM: PORTABLE CHEST 1 VIEW COMPARISON:  10/15/2018 FINDINGS: The ET tube tip is above the carina. There is an enteric tube with tip below the level of the GE junction. Bilateral pleural effusions and moderate interstitial edema noted. Left lung base opacity is increased from previous exam. IMPRESSION: 1. Worsening aeration to the left lung base. 2. CHF, unchanged. Electronically Signed   By: Kerby Moors M.D.   On:  10/16/2018 07:46    Medications:  Prior to Admission:  Medications Prior to Admission  Medication Sig Dispense Refill Last Dose  . carvedilol (COREG) 6.25 MG tablet TAKE (1) TABLET BY MOUTH TWICE DAILY WTIH A MEAL. (Patient taking differently: Take 6.25 mg by mouth 2 (two) times daily with a meal. TAKE (1) TABLET BY MOUTH TWICE DAILY WTIH A MEAL.) 60 tablet 6 09/16/2018 at Unknown time  . furosemide (LASIX) 20 MG tablet TAKE 20 MG EVERY OTHER DAY ALTERNATING WITH 30 MG EVERY OTHER DAY 135 tablet 1 09/22/2018 at Unknown time  . HYDROcodone-acetaminophen (NORCO) 5-325 MG per tablet Take 1-2 tablets by mouth every 4 (four) hours as needed. (Patient taking differently: Take 1 tablet by mouth every 6 (six) hours as needed for moderate pain. ) 40 tablet 0 09/19/2018 at Unknown time  . Ipratropium-Albuterol (COMBIVENT RESPIMAT) 20-100 MCG/ACT AERS respimat Inhale 1 puff into the lungs as needed. (Patient taking differently: Inhale 1 puff into the lungs 4 (four) times daily. ) 1 Inhaler 1 10/09/2018 at Unknown time  . loperamide (IMODIUM) 2 MG capsule Take 1 capsule (2 mg total) by mouth as needed for diarrhea or loose stools. (Patient taking differently: Take 4 mg by mouth daily as needed for diarrhea or loose stools. ) 20 capsule 2 09/24/2018 at Unknown time  . potassium chloride SA (K-DUR,KLOR-CON) 20 MEQ tablet Take 0.5 tablets (10 mEq total) by mouth daily. (Patient taking differently: Take 20 mEq by mouth 2 (two) times daily. ) 90 tablet 3 09/20/2018 at Unknown time  . SSD 1 % cream Apply 1 application topically 3 (three) times daily as needed. Applied to legs as needed   Taking  . albuterol (PROVENTIL) (2.5 MG/3ML) 0.083% nebulizer solution Inhale 3 mLs into the lungs every 4 (four) hours as needed for wheezing or shortness of breath.    Taking  . diltiazem (CARDIZEM CD) 120 MG 24 hr capsule TAKE (1) CAPSULE BY MOUTH TWICE DAILY. 180 capsule 0  at Unknown time  . diltiazem (CARDIZEM CD) 120 MG 24 hr  capsule TAKE (1) CAPSULE BY MOUTH TWICE DAILY. 180 capsule 3   . vitamin B-12 (CYANOCOBALAMIN) 1000 MCG tablet Take 1 tablet (1,000 mcg total) by mouth daily. 60 tablet 2 Taking  . warfarin (COUMADIN) 5 MG tablet Take 0.5-1 tablets (2.5-5 mg total) by mouth See admin instructions. TAKE 1/2 TABLET (2.'5mg'$  total) BY MOUTH DAILY, EXCEPT TAKE 1 TABLET ('5mg'$  total) ON MONDAYS, WEDNESDAYS AND FRIDAYS. (Patient not taking: Reported on 10/03/2018) 75 tablet 3 Not Taking at Unknown time   Scheduled: . chlorhexidine gluconate (MEDLINE KIT)  15 mL Mouth Rinse BID  . feeding supplement (PRO-STAT SUGAR FREE 64)  30 mL Oral Daily  . feeding supplement (VITAL AF 1.2 CAL)  1,000 mL Per Tube Q24H  . Influenza vac split quadrivalent PF  0.5 mL Intramuscular Tomorrow-1000  . insulin aspart  0-9 Units Subcutaneous Q4H  . insulin glargine  12 Units Subcutaneous QHS  . ipratropium  0.5 mg Nebulization Q6H  . ketoconazole   Topical BID  . levalbuterol  0.63 mg Nebulization Q6H  . mouth rinse  15 mL Mouth Rinse 10 times per day  . methylPREDNISolone (SOLU-MEDROL) injection  40 mg Intravenous Q8H  . metoprolol tartrate  5 mg Intravenous Q6H  . pantoprazole (PROTONIX) IV  40 mg Intravenous Q24H  . sodium chloride flush  3 mL Intravenous Q12H  . Warfarin - Pharmacist Dosing Inpatient   Does not apply Q24H   Continuous: . sodium chloride    . amiodarone 30 mg/hr (10/17/18 0127)  . ampicillin-sulbactam (UNASYN) IV 3 g (10/17/18 0438)  . dextrose 5 % and 0.9% NaCl 50 mL/hr at 10/17/18 0300  . diltiazem (CARDIZEM) infusion Stopped (10/16/18 2223)  . propofol (DIPRIVAN) infusion 27.5 mcg/kg/min (10/17/18 0300)   QTM:AUQJFH chloride, acetaminophen, fentaNYL (SUBLIMAZE) injection, fentaNYL (SUBLIMAZE) injection, HYDROcodone-acetaminophen, levalbuterol, LORazepam, sodium chloride flush  Assesment: He was admitted with atrial fib with rapid ventricular response.  He initially seemed to do fairly well then aspirated and  developed respiratory distress which culminated in him being intubated and placed on mechanical ventilation.  He remains on the ventilator.  He has pneumonia in the left lower lobe and may have some mucus plugging there.  This is on chest x-ray which I have personally reviewed.  He has acute on chronic systolic heart failure he is being treated for that.  His cardiac ejection fraction is worse than it was before  He had atrial fib with rapid ventricular response last night and is now on amiodarone with better control  He had an episode of GI bleeding which has resolved.  He has thrombocytopenia and had platelet replacement when he was bleeding.  Platelets are 90,000 today  He had been chronically anticoagulated because of his atrial fib but this was reversed when he had the bleeding.  His INR actually got worse as his hospitalization continued.  He has elevated troponin presumably from demand ischemia but did have some ST-T wave changes on EKG with his atrial fib with RVR  He has diabetes and his blood sugar is fairly well controlled  He had increased fever and cultures are pending  He is critically ill and may not survive this episode.  Family is considering what to do as far as CODE STATUS etc.  Palliative care consultation has been requested     Principal Problem:   Atrial fibrillation with RVR ([place]) Active Problems:   Long term current use of anticoagulant therapy   Aortic stenosis   CKD (chronic kidney disease) stage 3, GFR 30-59 ml/min (HCC)   Acute on chronic combined systolic and diastolic congestive heart failure (HCC)   Hematochezia   Supratherapeutic INR    Plan: Continue current treatments.  Little to add at this point.  Not a candidate for bronchoscopy now.  Palliative care consultation for decision making and goal setting    LOS: 6 days   Alonza Bogus 10/17/2018, 7:38 AM

## 2018-10-17 NOTE — Progress Notes (Signed)
PROGRESS NOTE  Cory Ryan  NTI:[id number]  DOB: 1941/10/06  DOA: 09/18/2018 PCP: [name], MD   Brief Admission Hx: Cory Ryan is a 78 y.o. male with medical history significant of chronic systolic congestive heart failure, chronic kidney disease, COPD, A. fib, chronic anticoagulation on Coumadin sent in from his primary care physician's office for A. fib with RVR.  Pt had a suspected aspiration event and developed acute respiratory failure and was subsequently intubated.  Pt had an acute rectal bleed and warfarin has been reversed    MDM/Assessment & Plan:   1. Acute on chronic respiratory failure with hypoxia.  Related to pneumonia and decompensated CHF.  Currently on mechanical ventilation.  Pulmonology following. 2. Atrial fibrillation with RVR -he was previously on diltiazem infusion, was weaned off.  Became tachycardic overnight and was started on amiodarone infusion.  Not a candidate for anticoagulation due to GI bleeding.  Heart rate appears to be better controlled on amiodarone.   3. Acute on chronic combined systolic and diastolic CHF -started on intravenous Lasix for volume overload.  Overall volume status appears to be improving.  Lasix currently on hold. 4. Elevated troponin - suspect this is demand ischemia from respiratory distress and acute CHF.  Cardiology team is following.   5. Multifocal pneumonia - Continue IV unasyn.  Continue supportive therapy.  Currently on vent. Pulmonary following.  He is also on bronchodilators and IV steroids. 6. Hematochezia - Pt had spontaneous bout of gross rectal bleeding.  He received vit K and FFP to reverse INR. His hemodynamics are stable now, bleeding seems to have stopped.  He remains critically ill.  Seen by GI not felt to be a candidate for endoscopic evaluation at this time.  Hemoglobin has been stable     7. Chronic aortic stenosis - stable. Follow.   8. Chronic anticoagulation - He had been off warfarin for last few days and  INR has been reversed with FFP and vitamin K 9. Stage 3 CKD -Lasix currently on hold due to rising creatinine.  Renal function today appears to be stabilizing.  DVT prophylaxis: warfarin Code Status: Full  Family Communication: No family present today Disposition Plan: Palliative care consultation requested to help address goals of care  Antibiotics  Unasyn 1/3 >  Subjective: Intubated on ventilator  Objective: Vitals:   10/17/18 1509 10/17/18 1540 10/17/18 1600 10/17/18 1700  BP:   (!) 108/53 (!) 100/51  Pulse:   90 93  Resp:   17 17  Temp:   98 F (36.7 C)   TempSrc:   Axillary   SpO2: 98% 98% 97% 97%  Weight:      Height:        Intake/Output Summary (Last 24 hours) at 10/17/2018 1744 Last data filed at 10/17/2018 1227 Gross per 24 hour  Intake 1567.68 ml  Output 2400 ml  Net -832.32 ml   Filed Weights   10/14/18 0500 10/15/18 0423 10/16/18 0453  Weight: 62.7 kg 63.7 kg 67 kg    Exam:  General exam: Intubated on ventilator Respiratory system: Bilateral rhonchi. Respiratory effort normal. Cardiovascular system: Irregular. No murmurs, rubs, gallops. Gastrointestinal system: Abdomen is nondistended, soft and nontender. No organomegaly or masses felt. Normal bowel sounds heard. Central nervous system: Unable to determine due to mental status Extremities: No C/C/E, +pedal pulses Skin: No rashes, lesions or ulcers Psychiatry: Sedated on ventilator.     Data Reviewed: Basic Metabolic Panel: Recent Labs  Lab 10/12/18 0331 10/13/18 0547 10/14/18  1700 10/14/18 2334 10/15/18 0346 10/16/18 0025 10/16/18 0733 10/16/18 1955 10/17/18 0147  NA 138 137 139  --  137 141 141 145 143  K 3.2* 3.7 3.8 3.7 3.8 4.0 3.4* 3.7 4.0  CL 102 103 106  --  105 109 108 113* 111  CO2 25 25 21*  --  21* '22 24 23 24  '$ GLUCOSE 104* 115* 171*  --  246* 225* 225* 228* 198*  BUN 26* 28* 37*  --  57* 80* 80* 82* 81*  CREATININE 1.34* 1.43* 1.90*  --  2.31* 2.58* 2.30* 1.96* 1.77*    CALCIUM 8.4* 8.4* 8.4*  --  7.9* 7.6* 7.7* 7.9* 8.1*  MG 2.1 2.2 2.3 2.6*  --   --   --  2.9*  --   PHOS  --   --   --   --   --   --  4.6  --  3.2   Liver Function Tests: Recent Labs  Lab 09/25/2018 1155 10/13/18 0547 10/14/18 0436 10/16/18 0025 10/16/18 0733 10/16/18 1956 10/17/18 0147  AST 22 15 14* 22  --  32  --   ALT '13 12 12 10  '$ --  17  --   ALKPHOS 71 67 62 62  --  56  --   BILITOT 1.5* 1.2 1.1 0.7  --  0.9  --   PROT 6.9 6.2* 6.4* 6.3*  --  6.2*  --   ALBUMIN 3.7 3.1* 3.0* 3.0* 3.2* 3.2* 3.1*   Recent Labs  Lab 10/10/2018 1155  LIPASE 31   No results for input(s): AMMONIA in the last 168 hours. CBC: Recent Labs  Lab 10/03/2018 1155  10/14/18 0436 10/16/18 0025 10/16/18 0733 10/16/18 1956 10/17/18 0147  WBC 5.4   < > 3.0* 10.3 6.7 7.3 7.4  NEUTROABS 4.2  --   --  9.6*  --  6.6  --   HGB 12.4*   < > 11.0* 10.3* 9.5* 9.4* 9.2*  HCT 39.7   < > 34.7* 33.5* 30.8* 31.3* 30.1*  MCV 96.6   < > 95.9 96.0 96.6 97.5 96.8  PLT 59*   < > 65* 110* 90* 93* 90*   < > = values in this interval not displayed.   Cardiac Enzymes: Recent Labs  Lab 10/06/2018 2121 10/12/18 0331 10/16/18 1955 10/17/18 0146 10/17/18 0906  TROPONINI 0.30* 0.30* 0.08* 0.09* 0.05*   CBG (last 3)  Recent Labs    10/17/18 0751 10/17/18 1154 10/17/18 1621  GLUCAP 185* 190* 232*   Recent Results (from the past 240 hour(s))  MRSA PCR Screening     Status: None   Collection Time: 10/12/18  5:36 AM  Result Value Ref Range Status   MRSA by PCR NEGATIVE NEGATIVE Final    Comment:        The GeneXpert MRSA Assay (FDA approved for NASAL specimens only), is one component of a comprehensive MRSA colonization surveillance program. It is not intended to diagnose MRSA infection nor to guide or monitor treatment for MRSA infections. Performed at [facility], [address], [place], [place] [address]   Culture, blood (routine x 2)     Status: None (Preliminary result)   Collection Time: 10/17/18   1:43 AM  Result Value Ref Range Status   Specimen Description BLOOD LEFT HAND  Final   Special Requests   Final    BOTTLES DRAWN AEROBIC AND ANAEROBIC Blood Culture adequate volume   Culture   Final  NO GROWTH < 12 HOURS Performed at [facility], [address] [place], [place] [address]    Report Status PENDING  Incomplete  Culture, blood (routine x 2)     Status: None (Preliminary result)   Collection Time: 10/17/18  1:46 AM  Result Value Ref Range Status   Specimen Description BLOOD RIGHT HAND  Final   Special Requests   Final    BOTTLES DRAWN AEROBIC AND ANAEROBIC Blood Culture results may not be optimal due to an excessive volume of blood received in culture bottles   Culture   Final    NO GROWTH < 12 HOURS Performed at [facility], [address], [place], [place] [address]    Report Status PENDING  Incomplete  Culture, respiratory (non-expectorated)     Status: None (Preliminary result)   Collection Time: 10/17/18  3:32 AM  Result Value Ref Range Status   Specimen Description   Final    TRACHEAL ASPIRATE Performed at [facility], [address], [place], [place] [address]    Special Requests   Final    NONE Performed at [facility], [address], [place], [place] [address]    Gram Stain   Final    MODERATE WBC PRESENT, PREDOMINANTLY PMN MODERATE GRAM NEGATIVE RODS Performed at [facility], [place] [address], [place], [place] [address]    Culture PENDING  Incomplete   Report Status PENDING  Incomplete     Studies: Dg Chest Port 1 View  Result Date: 10/17/2018 CLINICAL DATA:  Ventilator support EXAM: PORTABLE CHEST 1 VIEW COMPARISON:  10/16/2018 FINDINGS: Endotracheal tube tip is 4 cm above the carina. Nasogastric tube enters the stomach. Edema pattern persists, with left lower lobe collapse and a small left effusion. IMPRESSION: Persistent edema pattern with left lower lobe collapse and left effusion. Electronically Signed   By: Nelson Chimes M.D.   On:  10/17/2018 06:55   Dg Chest Port 1 View  Result Date: 10/16/2018 CLINICAL DATA:  Respiratory failure. EXAM: PORTABLE CHEST 1 VIEW COMPARISON:  10/15/2018 FINDINGS: The ET tube tip is above the carina. There is an enteric tube with tip below the level of the GE junction. Bilateral pleural effusions and moderate interstitial edema noted. Left lung base opacity is increased from previous exam. IMPRESSION: 1. Worsening aeration to the left lung base. 2. CHF, unchanged. Electronically Signed   By: Kerby Moors M.D.   On: 10/16/2018 07:46   Scheduled Meds: . chlorhexidine gluconate (MEDLINE KIT)  15 mL Mouth Rinse BID  . feeding supplement (PRO-STAT SUGAR FREE 64)  30 mL Oral Daily  . feeding supplement (VITAL AF 1.2 CAL)  1,000 mL Per Tube Q24H  . [START ON 10/18/2018] Influenza vac split quadrivalent PF  0.5 mL Intramuscular Tomorrow-1000  . insulin aspart  0-9 Units Subcutaneous Q4H  . insulin glargine  12 Units Subcutaneous QHS  . ipratropium  0.5 mg Nebulization Q6H  . ketoconazole   Topical BID  . levalbuterol  0.63 mg Nebulization Q6H  . mouth rinse  15 mL Mouth Rinse 10 times per day  . methylPREDNISolone (SOLU-MEDROL) injection  40 mg Intravenous Q8H  . metoprolol tartrate  5 mg Intravenous Q6H  . pantoprazole (PROTONIX) IV  40 mg Intravenous Q24H  . sodium chloride flush  3 mL Intravenous Q12H  . Warfarin - Pharmacist Dosing Inpatient   Does not apply Q24H   Continuous Infusions: . sodium chloride    . amiodarone 30 mg/hr (10/17/18 0127)  . ampicillin-sulbactam (  UNASYN) IV 3 g (10/17/18 1641)  . dextrose 5 % and 0.9% NaCl 50 mL/hr at 10/17/18 0300  . diltiazem (CARDIZEM) infusion Stopped (10/16/18 2223)  . propofol (DIPRIVAN) infusion 27.5 mcg/kg/min (10/17/18 1341)    Principal Problem:   Atrial fibrillation with RVR (HCC) Active Problems:   Long term current use of anticoagulant therapy   Aortic stenosis   CKD (chronic kidney disease) stage 3, GFR 30-59 ml/min (HCC)   Acute  on chronic combined systolic and diastolic congestive heart failure (HCC)   Hematochezia   Supratherapeutic INR  Critical Care Time spent: 30 mins  Kathie Dike, MD [facility]  If 7PM-7AM, please contact night-coverage [email or web address] Password [email or web address] 10/17/2018, 5:44 PM    LOS: 6 days

## 2018-10-17 NOTE — Care Management Important Message (Signed)
Important Message  Patient Details  Name: Cory Ryan MRN: [id number] Date of Birth: [birth year]   Medicare Important Message Given:  Yes    [name], [name] 10/17/2018, 12:02 PM

## 2018-10-17 NOTE — Progress Notes (Signed)
Pharmacy Antibiotic Note  Today is day #5 of Unasyn therapy for this 78 yo male being treated for aspiration pneumonia. WBC count is wnl and he's  currently afebrile.  Renal function is improving, with SCr trending down.  Blood  and respiratory cultures are in progress.  Plan: Increase  Unasyn to  3gm IV q8h (f/u transition to oral) Monitor labs, micro and vitals.   Height: 5\' 10"  (177.8 cm) Weight: 147 lb 11.3 oz (67 kg) IBW/kg (Calculated) : 73  Temp (24hrs), Avg:98.5 F (36.9 C), Min:97.8 F (36.6 C), Max:98.8 F (37.1 C)  Recent Labs  Lab 09/22/2018 1202 10/10/2018 1309  10/14/18 0436 10/15/18 0346 10/16/18 0025 10/16/18 0733 10/16/18 1955 10/16/18 1956 10/17/18 0147  WBC  --   --    < > 3.0*  --  10.3 6.7  --  7.3 7.4  CREATININE  --   --    < > 1.90* 2.31* 2.58* 2.30* 1.96*  --  1.77*  LATICACIDVEN 1.98* 1.52  --   --   --   --   --   --   --   --    < > = values in this interval not displayed.    Estimated Creatinine Clearance: 32.1 mL/min (A) (by C-G formula based on SCr of 1.77 mg/dL (H)).    No Known Allergies  Antimicrobials this admission: Augmentin 1/2 x 1 Unasyn 1/2 >>   Dose adjustments this admission: Unasyn 3g IV q12h adjusted to q8h for improved renal function    Microbiology results: 1/2 MRSA PCR: negative 1/6 Resp Cx: pending 1/6 BC x2: NG <12h  Thank you for allowing pharmacy to be a part of this patient's care.  Despina Pole 10/17/2018 11:02 AM

## 2018-10-17 NOTE — Progress Notes (Signed)
Subjective: Sedated on vent. Small amount of blood yesterday per nursing staff.   Objective: Vital signs in last 24 hours: Temp:  [97.8 F (36.6 C)-98.8 F (37.1 C)] 98.3 F (36.8 C) (01/06 1213) Pulse Rate:  [47-121] 92 (01/06 1200) Resp:  [15-24] 16 (01/06 1200) BP: (97-129)/(53-73) 114/57 (01/06 1200) SpO2:  [95 %-100 %] 100 % (01/06 1227) FiO2 (%):  [35 %] 35 % (01/06 1227) Last BM Date: 10/16/18 General:   Sedated on vent. Acutely ill-appearing Abdomen:  Bowel sounds present, soft, non-tender, non-distended. Neurologic:  Unable to assess    Intake/Output from previous day: 01/05 0701 - 01/06 0700 In: 2945.3 [I.V.:1648.3; NG/GT:794.8; IV Piggyback:382.1] Out: 900 [Urine:900] Intake/Output this shift: Total I/O In: 705.8 [I.V.:705.8] Out: 1500 [Urine:1500]  Lab Results: Recent Labs    10/16/18 0733 10/16/18 1956 10/17/18 0147  WBC 6.7 7.3 7.4  HGB 9.5* 9.4* 9.2*  HCT 30.8* 31.3* 30.1*  PLT 90* 93* 90*   BMET Recent Labs    10/16/18 0733 10/16/18 1955 10/17/18 0147  NA 141 145 143  K 3.4* 3.7 4.0  CL 108 113* 111  CO2 24 23 24   GLUCOSE 225* 228* 198*  BUN 80* 82* 81*  CREATININE 2.30* 1.96* 1.77*  CALCIUM 7.7* 7.9* 8.1*   LFT Recent Labs    10/16/18 0025 10/16/18 0733 10/16/18 1956 10/17/18 0147  PROT 6.3*  --  6.2*  --   ALBUMIN 3.0* 3.2* 3.2* 3.1*  AST 22  --  32  --   ALT 10  --  17  --   ALKPHOS 62  --  56  --   BILITOT 0.7  --  0.9  --   BILIDIR  --   --  0.3*  --   IBILI  --   --  0.6  --    PT/INR Recent Labs    10/16/18 0733 10/17/18 0147  LABPROT 19.5* 15.1  INR 1.67 1.20     Studies/Results: Dg Chest Port 1 View  Result Date: 10/17/2018 CLINICAL DATA:  Ventilator support EXAM: PORTABLE CHEST 1 VIEW COMPARISON:  10/16/2018 FINDINGS: Endotracheal tube tip is 4 cm above the carina. Nasogastric tube enters the stomach. Edema pattern persists, with left lower lobe collapse and a small left effusion. IMPRESSION: Persistent  edema pattern with left lower lobe collapse and left effusion. Electronically Signed   By: Nelson Chimes M.D.   On: 10/17/2018 06:55   Dg Chest Port 1 View  Result Date: 10/16/2018 CLINICAL DATA:  Respiratory failure. EXAM: PORTABLE CHEST 1 VIEW COMPARISON:  10/15/2018 FINDINGS: The ET tube tip is above the carina. There is an enteric tube with tip below the level of the GE junction. Bilateral pleural effusions and moderate interstitial edema noted. Left lung base opacity is increased from previous exam. IMPRESSION: 1. Worsening aeration to the left lung base. 2. CHF, unchanged. Electronically Signed   By: Kerby Moors M.D.   On: 10/16/2018 07:46    Assessment/Plan: 78 year old male with rectal bleeding in setting of supratherapeutic INR on Coumadin, multiple comorbidities, on ventilatory support currently. Small amount of blood yesterday, no rectal bleeding today. He is not an endoscopic candidate and prognosis overall is poor. Palliative consultation has been requested.  Will will sign off for now as prognosis is grim. Would continue PPI and avoid anticoagulation in this setting due to risks outweighing the benefits.  Annitta Needs, PhD, ANP-BC [facility]      LOS: 6 days    10/17/2018,  1:36 PM

## 2018-10-17 NOTE — Progress Notes (Signed)
Temp has increased to 100.1 F axillary Med given (see eMar) Room temp controlled Sheets removed  Fan in use

## 2018-10-17 NOTE — Progress Notes (Signed)
Initial Nutrition Assessment  DOCUMENTATION CODES:      INTERVENTION:  TF on hold per RN- pt vomited yesterday  Palliative medicine to assess tomorrow  RD will continue to follow patient care progression  NUTRITION DIAGNOSIS:   Inadequate oral intake related to inability to eat as evidenced by NPO status.   GOAL:  Provide needs based on ASPEN/SCCM guidelines  MONITOR:   Vent status, Labs, Weight trends, I & O's, TF tolerance  REASON FOR ASSESSMENT:   Consult Enteral/tube feeding initiation and management  ASSESSMENT:  Patient is remains intubated on ventilator support following an acute decline in respiratory status on 1/2. He has a hx of CHF, COPD (Emphysema), CAD, CKD-3, Colon Cancer.   Palliative medicine has been consulted and patient will be followed tomorrow per emr.   Initially he presented on 12/31 to ED sent by PCP due to increased heart rate and acute on chronic exacerbation of CHF.   Tube feeding- on hold after vomiting episode  MV: 10.4  L/min  Temp (24hrs), Avg:98.5 F (36.9 C), Min:97.8 F (36.6 C), Max:98.8 F (37.1 C)  Propofol currently at 10.76 ml/hr providing 284 kcal lipids q 24 hr period.   Meds: lasix, prednisone, lopressor (5 mg q 6 hr), protonix   Intake/Output Summary (Last 24 hours) at 10/17/2018 1055 Last data filed at 10/17/2018 0300 Gross per 24 hour  Intake 2845.29 ml  Output 900 ml  Net 1945.29 ml   Patient weight has increased 6% (4.5 kg) since Friday 1/3.  Labs: BMP Latest Ref Rng & Units 10/17/2018 10/16/2018 10/16/2018  Glucose 70 - 99 mg/dL 198(H) 228(H) 225(H)  BUN 8 - 23 mg/dL 81(H) 82(H) 80(H)  Creatinine 0.61 - 1.24 mg/dL 1.77(H) 1.96(H) 2.30(H)  Sodium 135 - 145 mmol/L 143 145 141  Potassium 3.5 - 5.1 mmol/L 4.0 3.7 3.4(L)  Chloride 98 - 111 mmol/L 111 113(H) 108  CO2 22 - 32 mmol/L 24 23 24   Calcium 8.9 - 10.3 mg/dL 8.1(L) 7.9(L) 7.7(L)     NUTRITION - FOCUSED PHYSICAL EXAM:    Most Recent Value  Orbital Region   Mild depletion  Thoracic and Lumbar Region  Moderate depletion  Buccal Region  Unable to assess  Temple Region  Mild depletion  Clavicle Bone Region  Moderate depletion  Clavicle and Acromion Bone Region  Moderate depletion  Scapular Bone Region  Unable to assess  Dorsal Hand  Unable to assess  Patellar Region  Severe depletion  Anterior Thigh Region  Severe depletion  Edema (RD Assessment)  None      Diet Order:   Diet Order            Diet NPO time specified  Diet effective now              EDUCATION NEEDS:   Not appropriate for education at this time   Skin:  Skin Assessment: Reviewed RN Assessment(dry and flaky- RD noted bilateral lower extremities dark blue)  Last BM:  1/5 large  Height:   Ht Readings from Last 1 Encounters:  10/13/18 5\' 10"  (1.778 m)    Weight:   Wt Readings from Last 1 Encounters:  10/16/18 67 kg  Admit wt- 62.7 kg  Ideal Body Weight:  75.4 kg  BMI:  Body mass index is 21.19 kg/m.  Estimated Nutritional Needs:   Kcal:  9892-1194 (PSE +1.1 CF)  Protein:  88-101 gr (1.4-1.6 gr/kg/bw)  Fluid:  per MD goals   Colman Cater MS,RD,CSG,LDN Office: [phone] Pager: [phone]

## 2018-10-17 NOTE — Progress Notes (Signed)
Palliative Medicine RN Note: Palliative Medicine consult order noted. PMT provider will return to [facility] on 10/18/18. If the patient remains hospitalized, the consult will be evaluated at that time. If recommendations are needed in the interim, please call our office at [phone].   Marjie Skiff Nya Monds, RN, BSN, [facility] Palliative Medicine Team 10/17/2018 9:26 AM Office [phone]

## 2018-10-17 NOTE — Progress Notes (Signed)
eLink Physician-Brief Progress Note Patient Name: STROTHER EVERITT DOB: [birth year] MRN: [id number]   Date of Service  10/17/2018  HPI/Events of Note  Fever to 101.6 F - Currently on Unasyn. Hospitalist has ordered Tylenol.   eICU Interventions  Will order: 1. Blood cultures X 2. 2. Tracheal aspirate culture.         Sommer,Steven Cornelia Copa 10/17/2018, 12:50 AM

## 2018-10-17 NOTE — Progress Notes (Signed)
At shift change noticed rhythm of patient at when checking on patient E-Link RN camera in to do the same. E-Link physician notified. Orders received from [place] (Dr. Oletta Darter) and Hospitalist (Dr Olevia Bowens) visited and also added orders. Dr Olevia Bowens did also speak with Cardiologist. All orders completed as of the time of this note.  See EMR for medications given. Fever treated (see EMR)

## 2018-10-18 DIAGNOSIS — Z7189 Other specified counseling: Secondary | ICD-10-CM

## 2018-10-18 DIAGNOSIS — Z515 Encounter for palliative care: Secondary | ICD-10-CM

## 2018-10-18 DIAGNOSIS — K921 Melena: Secondary | ICD-10-CM

## 2018-10-18 DIAGNOSIS — J189 Pneumonia, unspecified organism: Secondary | ICD-10-CM

## 2018-10-18 DIAGNOSIS — J9620 Acute and chronic respiratory failure, unspecified whether with hypoxia or hypercapnia: Secondary | ICD-10-CM

## 2018-10-18 LAB — RENAL FUNCTION PANEL
Albumin: 2.6 g/dL — ABNORMAL LOW (ref 3.5–5.0)
Anion gap: 7 (ref 5–15)
BUN: 66 mg/dL — ABNORMAL HIGH (ref 8–23)
CO2: 23 mmol/L (ref 22–32)
Calcium: 8.1 mg/dL — ABNORMAL LOW (ref 8.9–10.3)
Chloride: 119 mmol/L — ABNORMAL HIGH (ref 98–111)
Creatinine, Ser: 1.38 mg/dL — ABNORMAL HIGH (ref 0.61–1.24)
GFR calc Af Amer: 56 mL/min — ABNORMAL LOW (ref >60)
GFR calc non Af Amer: 48 mL/min — ABNORMAL LOW (ref >60)
Glucose, Bld: 202 mg/dL — ABNORMAL HIGH (ref 70–99)
Phosphorus: 2.9 mg/dL (ref 2.5–4.6)
Potassium: 3.2 mmol/L — ABNORMAL LOW (ref 3.5–5.1)
Sodium: 149 mmol/L — ABNORMAL HIGH (ref 135–145)

## 2018-10-18 LAB — CBC
HEMATOCRIT: 32.1 % — AB (ref 39.0–52.0)
HEMOGLOBIN: 9.6 g/dL — AB (ref 13.0–17.0)
MCH: 29.1 pg (ref 26.0–34.0)
MCHC: 29.9 g/dL — ABNORMAL LOW (ref 30.0–36.0)
MCV: 97.3 fL (ref 80.0–100.0)
Platelets: 83 10*3/uL — ABNORMAL LOW (ref 150–400)
RBC: 3.3 MIL/uL — ABNORMAL LOW (ref 4.22–5.81)
RDW: 14.2 % (ref 11.5–15.5)
WBC: 10.4 10*3/uL (ref 4.0–10.5)
nRBC: 0.2 % (ref 0.0–0.2)

## 2018-10-18 LAB — BLOOD GAS, ARTERIAL
Acid-Base Excess: 2.5 mmol/L — ABNORMAL HIGH (ref 0.0–2.0)
Bicarbonate: 26.9 mmol/L (ref 20.0–28.0)
Drawn by: 22223
FIO2: 35
MECHVT: 540 mL
O2 SAT: 97.6 %
PEEP: 5 cmH2O
Patient temperature: 37.8
RATE: 16 resp/min
pCO2 arterial: 35.8 mmHg (ref 32.0–48.0)
pH, Arterial: 7.473 — ABNORMAL HIGH (ref 7.350–7.450)
pO2, Arterial: 93.2 mmHg (ref 83.0–108.0)

## 2018-10-18 LAB — GLUCOSE, CAPILLARY
Glucose-Capillary: 153 mg/dL — ABNORMAL HIGH (ref 70–99)
Glucose-Capillary: 172 mg/dL — ABNORMAL HIGH (ref 70–99)
Glucose-Capillary: 178 mg/dL — ABNORMAL HIGH (ref 70–99)
Glucose-Capillary: 181 mg/dL — ABNORMAL HIGH (ref 70–99)
Glucose-Capillary: 187 mg/dL — ABNORMAL HIGH (ref 70–99)
Glucose-Capillary: 192 mg/dL — ABNORMAL HIGH (ref 70–99)

## 2018-10-18 LAB — PROTIME-INR
INR: 1.26
Prothrombin Time: 15.7 seconds — ABNORMAL HIGH (ref 11.4–15.2)

## 2018-10-18 LAB — PREPARE FRESH FROZEN PLASMA: Unit division: 0

## 2018-10-18 LAB — BPAM FFP
BLOOD PRODUCT EXPIRATION DATE: 202001102359
BLOOD PRODUCT EXPIRATION DATE: 202001102359
ISSUE DATE / TIME: 202001050050
ISSUE DATE / TIME: 202001050141
Unit Type and Rh: 5100
Unit Type and Rh: 5100

## 2018-10-18 MED ORDER — SODIUM CHLORIDE 0.45 % IV BOLUS
500.0000 mL | Freq: Once | INTRAVENOUS | Status: AC
  Administered 2018-10-18: 500 mL via INTRAVENOUS

## 2018-10-18 MED ORDER — MIDODRINE HCL 5 MG PO TABS
10.0000 mg | ORAL_TABLET | Freq: Three times a day (TID) | ORAL | Status: DC
  Filled 2018-10-18: qty 2

## 2018-10-18 MED ORDER — POTASSIUM CHLORIDE 20 MEQ/15ML (10%) PO SOLN
20.0000 meq | Freq: Once | ORAL | Status: AC
  Administered 2018-10-18: 20 meq via ORAL
  Filled 2018-10-18: qty 30

## 2018-10-18 MED ORDER — SODIUM CHLORIDE 0.9 % IV BOLUS
500.0000 mL | Freq: Once | INTRAVENOUS | Status: AC
  Administered 2018-10-18: 500 mL via INTRAVENOUS

## 2018-10-18 MED ORDER — METOPROLOL TARTRATE 5 MG/5ML IV SOLN
2.5000 mg | INTRAVENOUS | Status: DC | PRN
  Administered 2018-10-18 – 2018-10-20 (×2): 2.5 mg via INTRAVENOUS
  Filled 2018-10-18 (×2): qty 5

## 2018-10-18 MED ORDER — POTASSIUM CHLORIDE 2 MEQ/ML IV SOLN
INTRAVENOUS | Status: DC
  Administered 2018-10-18 – 2018-10-20 (×6): via INTRAVENOUS
  Filled 2018-10-18 (×10): qty 1000

## 2018-10-18 MED ORDER — DIGOXIN 0.25 MG/ML IJ SOLN
0.5000 mg | Freq: Once | INTRAMUSCULAR | Status: AC
  Administered 2018-10-18: 0.5 mg via INTRAVENOUS
  Filled 2018-10-18: qty 2

## 2018-10-18 MED ORDER — METOPROLOL TARTRATE 5 MG/5ML IV SOLN
2.5000 mg | Freq: Four times a day (QID) | INTRAVENOUS | Status: DC
  Administered 2018-10-18 – 2018-10-19 (×2): 2.5 mg via INTRAVENOUS
  Filled 2018-10-18 (×2): qty 5

## 2018-10-18 MED ORDER — METOPROLOL TARTRATE 5 MG/5ML IV SOLN
2.5000 mg | Freq: Once | INTRAVENOUS | Status: AC
  Administered 2018-10-18: 2.5 mg via INTRAVENOUS
  Filled 2018-10-18: qty 5

## 2018-10-18 MED ORDER — MIDODRINE HCL 5 MG PO TABS
10.0000 mg | ORAL_TABLET | Freq: Three times a day (TID) | ORAL | Status: DC
  Administered 2018-10-18 – 2018-10-20 (×6): 10 mg via ORAL
  Filled 2018-10-18 (×5): qty 2

## 2018-10-18 NOTE — Consult Note (Signed)
Consultation Note Date: 10/18/2018   Patient Name: Cory Ryan  DOB: August 31, 1941  MRN: [id number]  Age / Sex: 78 y.o., male  PCP: [name], MD Referring Physician: Kathie Dike, MD  Reason for Consultation: Establishing goals of care  HPI/Patient Profile: 78 y.o. male  with past medical history of chronic systolic heart failure, chronic kidney disease, COPD, afib on coumadin, aortic stenosis admitted on 10/09/2018 from PCP office with afib RVR. Patient had suspected aspiration event on 10/13/18 and developed acute respiratory failure requiring intubation/mechanical ventilation. Hospitalization complicated by acute rectal bleed s/p FFP and vitamin K. Not a candidate for GI procedures or bronchoscopy. Antibiotics initiated for multifocal pneumonia. Pulmonology following. Patient remains critically ill on ventilator. Palliative medicine consultation for goals of care.    Clinical Assessment and Goals of Care:  I have reviewed medical records, discussed with care team, and met with daughter Altha Harm) and son Silbernagel) in family waiting room to discuss diagnosis, prognosis, GOC, EOL wishes, disposition and options. Patient remains intubated, sedated, and unable to participate in [place] conversation.   I introduced Palliative Medicine as specialized medical care for people living with serious illness. It focuses on providing relief from the symptoms and stress of a serious illness.   We discussed a brief life review of the patient. Children describe Mr. Fronczak as "sweet" and "social" man. 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 Mr. Simien greatly enjoys fishing. Prior to admission, patient living with son, Terrance. Baseline, able to ambulate with cane/walker and able to complete ADL's with minimal assist.   Discussed events leading up to hospitalization and course of  hospitalization including diagnoses and interventions. Discussed poor prognosis due to acute issues with underlying chronic co-morbidities. Altha Harm does further share his history of multiple heart conditions. Daishawn is very tearful throughout the conversation. He wonders if prognosis would be different if he would have brought his dad to the hospital sooner. Again explained series of events since admission. Emotional support provided.   I attempted to elicit values and goals of care important to the patient and family. Advanced directives, concepts specific to code status, artifical feeding and hydration, and rehospitalization were considered and discussed. The patient does not have a living will documented but children feel strongly that he would NOT wish for prolonged, heroic interventions including trach/prolonged ventilator or feeding tube. If he couldn't walk or communicate, they feel he would find this quality of life unacceptable. He would not wish to just "exist." They do not want to see him "suffer." Altha Harm shares that he shed a few tears this afternoon while she was visiting.   The difference between aggressive medical intervention and comfort care was considered in light of the patient's goals of care. Discussed in detail compassionate extubation with shift to comfort measures in order to allow comfort and dignity at EOL. Explained focus on symptom management to ensure relief from pain or suffering.   Further discussed code status. Children agree that they do NOT  wish for their father to be resuscitated if his heart stopped. Discussed DNR. Again children share he would not wish for continued aggressive medical pathway including trach/peg.   Altha Harm and Dawn speak of importance to allow family and friends to visit before withdrawing care including grandchildren, great grandchildren, patient's brothers, and church members.   Discussed high risk for decline with ongoing afib RVR despite  medication as well as soft blood pressure. Altha Harm and Richardson Landry agree with DNR but wish to continue current interventions until family visits. Prepared them for 'anything to happen at anytime' with low blood pressure. Encouraged against escalation of care including pressors. Children understand but hopeful he will survive until Thursday or Friday, which will allow family time to visit.   Therapeutic listening and emotional/spiritual support provided. Questions and concerns were addressed.  Hard Choices booklet left for review. PMT contact information given.   Christine and I plan to follow-up tomorrow morning.    SUMMARY OF RECOMMENDATIONS    Extensive GOC discussion with patient's two children Altha Harm and Efrem). Although patient does not have a documented living will, adult children feel strongly that patient would NOT wish for prolonged heroic interventions including resuscitation, trach/vent, feeding tube.   Children agree with DNR code status. Leaning towards compassionate extubation to comfort measures only. Children wish to continue current plan of care until family/friends have visited and said goodbyes.   Encouraged against further escalation of care including pressors. PMT provider will f/u with family in AM to further discuss [place] and plan for terminal wean.   Code Status/Advance Care Planning:  DNR  Symptom Management:   Per attending  Palliative Prophylaxis:   Aspiration, Delirium Protocol, Oral Care and Turn Reposition  Additional Recommendations (Limitations, Scope, Preferences):  DNR. Family leaning towards comfort measures and terminal extubation.   Psycho-social/Spiritual:   Desire for further Chaplaincy support:yes  Additional Recommendations: Caregiving  Support/Resources, Compassionate Wean Education and Education on Hospice  Prognosis:   Poor prognosis  Discharge Planning: To Be Determined      Primary Diagnoses: Present on Admission: . Atrial  fibrillation with RVR ([place]) . Acute on chronic combined systolic and diastolic congestive heart failure ([place]) . CKD (chronic kidney disease) stage 3, GFR 30-59 ml/min (HCC)   I have reviewed the medical record, interviewed the patient and family, and examined the patient. The following aspects are pertinent.  Past Medical History:  Diagnosis Date  . Anemia   . Anxiety   . Aortic stenosis    Moderate  . Arthritis   . Atrial fibrillation ([place])   . Chronic systolic heart failure (HCC)    LVEF 40-45%  . CKD (chronic kidney disease) stage 3, GFR 30-59 ml/min (HCC)   . Colon cancer ([place])   . Colon polyps 12/2005   Per colonoscopy  . COPD (chronic obstructive pulmonary disease) ([place])   . Coronary atherosclerosis of native coronary artery    Multivessel status post CABG  . Emphysema   . Gastric erosions 12/2005   Per EGD - Dr. [name]  . GERD (gastroesophageal reflux disease)   . Hypothyroidism   . Ischemic cardiomyopathy   . Mitral regurgitation    Moderate  . Myocardial infarction [facility])    Remote  . Psoriasis   . Pulmonary nodules    Social History   Socioeconomic History  . Marital status: Widowed    Spouse name: Not on file  . Number of children: Not on file  . Years of education: Not on file  . Highest  education level: Not on file  Occupational History  . Not on file  Social Needs  . Financial resource strain: Not on file  . Food insecurity:    Worry: Not on file    Inability: Not on file  . Transportation needs:    Medical: Not on file    Non-medical: Not on file  Tobacco Use  . Smoking status: Former Smoker    Packs/day: 1.00    Years: 30.00    Pack years: 30.00    Types: Cigarettes    Last attempt to quit: 03/17/2003    Years since quitting: 15.6  . Smokeless tobacco: Never Used  Substance and Sexual Activity  . Alcohol use: No    Alcohol/week: 0.0 standard drinks    Comment: no alcohol use since '04  . Drug use: No  . Sexual activity: Not Currently    Lifestyle  . Physical activity:    Days per week: Not on file    Minutes per session: Not on file  . Stress: Not on file  Relationships  . Social connections:    Talks on phone: Not on file    Gets together: Not on file    Attends religious service: Not on file    Active member of club or organization: Not on file    Attends meetings of clubs or organizations: Not on file    Relationship status: Not on file  Other Topics Concern  . Not on file  Social History Narrative  . Not on file   Family History  Problem Relation Age of Onset  . Cancer Brother   . Heart Problems Mother        unknown  . Hypertension Other    Scheduled Meds: . chlorhexidine gluconate (MEDLINE KIT)  15 mL Mouth Rinse BID  . feeding supplement (PRO-STAT SUGAR FREE 64)  30 mL Oral Daily  . feeding supplement (VITAL AF 1.2 CAL)  1,000 mL Per Tube Q24H  . Influenza vac split quadrivalent PF  0.5 mL Intramuscular Tomorrow-1000  . insulin aspart  0-9 Units Subcutaneous Q4H  . insulin glargine  12 Units Subcutaneous QHS  . ipratropium  0.5 mg Nebulization Q6H  . ketoconazole   Topical BID  . levalbuterol  0.63 mg Nebulization Q6H  . mouth rinse  15 mL Mouth Rinse 10 times per day  . methylPREDNISolone (SOLU-MEDROL) injection  40 mg Intravenous Q8H  . metoprolol tartrate  2.5 mg Intravenous Q6H  . pantoprazole (PROTONIX) IV  40 mg Intravenous Q24H  . sodium chloride flush  3 mL Intravenous Q12H  . Warfarin - Pharmacist Dosing Inpatient   Does not apply Q24H   Continuous Infusions: . sodium chloride    . amiodarone 30 mg/hr (10/18/18 1751)  . ampicillin-sulbactam (UNASYN) IV 3 g (10/18/18 1635)  . dextrose 5 % 1,000 mL with potassium chloride 40 mEq infusion 125 mL/hr at 10/18/18 1814  . propofol (DIPRIVAN) infusion 10 mcg/kg/min (10/18/18 1750)   PRN Meds:.sodium chloride, acetaminophen, fentaNYL (SUBLIMAZE) injection, fentaNYL (SUBLIMAZE) injection, HYDROcodone-acetaminophen, levalbuterol, LORazepam,  sodium chloride flush Medications Prior to Admission:  Prior to Admission medications   Medication Sig Start Date End Date Taking? Authorizing Provider  carvedilol (COREG) 6.25 MG tablet TAKE (1) TABLET BY MOUTH TWICE DAILY WTIH A MEAL. Patient taking differently: Take 6.25 mg by mouth 2 (two) times daily with a meal. TAKE (1) TABLET BY MOUTH TWICE DAILY WTIH A MEAL. 03/16/18  Yes [name], MD  furosemide (LASIX) 20 MG tablet  TAKE 20 MG EVERY OTHER DAY ALTERNATING WITH 30 MG EVERY OTHER DAY 09/14/18  Yes [name], MD  HYDROcodone-acetaminophen (NORCO) 5-325 MG per tablet Take 1-2 tablets by mouth every 4 (four) hours as needed. Patient taking differently: Take 1 tablet by mouth every 6 (six) hours as needed for moderate pain.  12/18/13  Yes Aviva Signs, MD  Ipratropium-Albuterol (COMBIVENT RESPIMAT) 20-100 MCG/ACT AERS respimat Inhale 1 puff into the lungs as needed. Patient taking differently: Inhale 1 puff into the lungs 4 (four) times daily.  06/15/13  Yes [name], MD  loperamide (IMODIUM) 2 MG capsule Take 1 capsule (2 mg total) by mouth as needed for diarrhea or loose stools. Patient taking differently: Take 4 mg by mouth daily as needed for diarrhea or loose stools.  10/29/12  Yes Chelsea Primus, MD  potassium chloride SA (K-DUR,KLOR-CON) 20 MEQ tablet Take 0.5 tablets (10 mEq total) by mouth daily. Patient taking differently: Take 20 mEq by mouth 2 (two) times daily.  02/16/17  Yes Lendon Colonel, NP  SSD 1 % cream Apply 1 application topically 3 (three) times daily as needed. Applied to legs as needed 07/14/18  Yes [provider]  albuterol (PROVENTIL) (2.5 MG/3ML) 0.083% nebulizer solution Inhale 3 mLs into the lungs every 4 (four) hours as needed for wheezing or shortness of breath.  08/06/15   [provider]  diltiazem (CARDIZEM CD) 120 MG 24 hr capsule TAKE (1) CAPSULE BY MOUTH TWICE DAILY. 06/14/18   [name], MD  diltiazem  (CARDIZEM CD) 120 MG 24 hr capsule TAKE (1) CAPSULE BY MOUTH TWICE DAILY. 09/14/18   [name], MD  vitamin B-12 (CYANOCOBALAMIN) 1000 MCG tablet Take 1 tablet (1,000 mcg total) by mouth daily. 03/16/17   Twana First, MD  warfarin (COUMADIN) 5 MG tablet Take 0.5-1 tablets (2.5-5 mg total) by mouth See admin instructions. TAKE 1/2 TABLET (2.1m total) BY MOUTH DAILY, EXCEPT TAKE 1 TABLET (54mtotal) ON MONDAYS, WEDNESDAYS AND FRIDAYS. Patient not taking: Reported on 09/20/2018 09/14/18   McSatira SarkMD   No Known Allergies Review of Systems  Unable to perform ROS: Acuity of condition    Physical Exam Vitals signs and nursing note reviewed.  Constitutional:      Interventions: He is sedated and intubated.  Cardiovascular:     Rate and Rhythm: Rhythm irregularly irregular.     Comments: Afib RVR Pulmonary:     Effort: No tachypnea, accessory muscle usage or respiratory distress. He is intubated.  Abdominal:     Tenderness: There is no abdominal tenderness.  Skin:    General: Skin is warm and dry.    Vital Signs: BP (!) 82/47   Pulse (!) 56   Temp 99.6 F (37.6 C) (Axillary)   Resp (!) 22   Ht _0  (1.778 m)   Wt 65.1 kg   SpO2 96%   BMI 20.59 kg/m  Pain Scale: CPOT POSS *See Group Information*: 2-Acceptable,Slightly drowsy, easily aroused Pain Score: 0-No pain  SpO2: SpO2: 96 % O2 Device:SpO2: 96 % O2 Flow Rate: .O2 Flow Rate (L/min): 4 L/min  IO: Intake/output summary:   Intake/Output Summary (Last 24 hours) at 10/18/2018 1914 Last data filed at 10/18/2018 1831 Gross per 24 hour  Intake 4473.69 ml  Output 1150 ml  Net 3323.69 ml    LBM: Last BM Date: 10/16/18 Baseline Weight: Weight: 72.6 kg Most recent weight: Weight: 65.1 kg     Palliative Assessment/Data: PPS 20%  Flowsheet Rows     Most Recent Value  Intake Tab  Referral Department  Hospitalist  Unit at Time of Referral  ICU  Palliative Care Primary Diagnosis  Cardiac  Date Notified   10/16/18  Palliative Care Type  New Palliative care  Reason for referral  Clarify Goals of Care  Date of Admission  09/15/2018  Date first seen by Palliative Care  10/18/18  # of days IP prior to Palliative referral  5  Clinical Assessment  Palliative Performance Scale Score  20%  Psychosocial & Spiritual Assessment  Palliative Care Outcomes  Patient/Family meeting held?  Yes  Who was at the meeting?  daughter and son  Palliative Care Outcomes  Clarified goals of care, Provided end of life care assistance, Provided psychosocial or spiritual support, ACP counseling assistance      Time In: 1430 Time Out: 1600 Time Total: 90 Greater than 50%  of this time was spent counseling and coordinating care related to the above assessment and plan.  Signed by:  Ihor Dow, FNP-C Palliative Medicine Team  Phone: [phone] Fax: [phone]   Please contact Palliative Medicine Team phone at [phone] for questions and concerns.  For individual provider: See Shea Evans

## 2018-10-18 NOTE — Progress Notes (Signed)
Subjective: He remains intubated and on the ventilator.  He had fever again.  Blood cultures and sputum culture that were done early on the morning of the sixth are negative so far but pending.  He has more secretions.  His blood pressure is lower in the 80s.  Heart rates about 120 now.  Objective: Vital signs in last 24 hours: Temp:  [97.8 F (36.6 C)-100.1 F (37.8 C)] 99.3 F (37.4 C) (01/07 0400) Pulse Rate:  [59-119] 97 (01/07 0613) Resp:  [15-26] 17 (01/07 0613) BP: (85-126)/(40-71) 103/58 (01/07 0613) SpO2:  [97 %-100 %] 98 % (01/07 0613) FiO2 (%):  [35 %] 35 % (01/07 0417) Weight:  [65.1 kg] 65.1 kg (01/07 0500) Weight change:  Last BM Date: 10/16/18  Intake/Output from previous day: 01/06 0701 - 01/07 0700 In: 2623.7 [I.V.:2523.7; IV Piggyback:100] Out: 2650 [Urine:2650]  PHYSICAL EXAM General appearance: Intubated sedated on mechanical ventilation Resp: rhonchi bilaterally Cardio: irregularly irregular rhythm GI: soft, non-tender; bowel sounds normal; no masses,  no organomegaly Extremities: He has a lot of third spacing of fluid now.  He has chronic discoloration of the lower extremities  Lab Results:  Results for orders placed or performed during the hospital encounter of 10/06/2018 (from the past 48 hour(s))  Glucose, capillary     Status: Abnormal   Collection Time: 10/16/18 11:12 AM  Result Value Ref Range   Glucose-Capillary 146 (H) 70 - 99 mg/dL  Triglycerides     Status: None   Collection Time: 10/16/18  1:03 PM  Result Value Ref Range   Triglycerides 76 <150 mg/dL    Comment: Performed at [facility], [address], [place], [place] [address]  Glucose, capillary     Status: Abnormal   Collection Time: 10/16/18  4:44 PM  Result Value Ref Range   Glucose-Capillary 198 (H) 70 - 99 mg/dL  Glucose, capillary     Status: Abnormal   Collection Time: 10/16/18  7:35 PM  Result Value Ref Range   Glucose-Capillary 212 (H) 70 - 99 mg/dL   Comment 1 Notify RN     Comment 2 Document in Chart   Basic metabolic panel     Status: Abnormal   Collection Time: 10/16/18  7:55 PM  Result Value Ref Range   Sodium 145 135 - 145 mmol/L   Potassium 3.7 3.5 - 5.1 mmol/L   Chloride 113 (H) 98 - 111 mmol/L   CO2 23 22 - 32 mmol/L   Glucose, Bld 228 (H) 70 - 99 mg/dL   BUN 82 (H) 8 - 23 mg/dL   Creatinine, Ser 1.96 (H) 0.61 - 1.24 mg/dL   Calcium 7.9 (L) 8.9 - 10.3 mg/dL   GFR calc non Af Amer 32 (L) >60 mL/min   GFR calc Af Amer 37 (L) >60 mL/min   Anion gap 9 5 - 15    Comment: Performed at [facility], [address], [place], [place] [address]  Magnesium     Status: Abnormal   Collection Time: 10/16/18  7:55 PM  Result Value Ref Range   Magnesium 2.9 (H) 1.7 - 2.4 mg/dL    Comment: Performed at [facility], [address], [place], [place] [address]  Troponin I - Now Then Q6H     Status: Abnormal   Collection Time: 10/16/18  7:55 PM  Result Value Ref Range   Troponin I 0.08 (HH) <0.03 ng/mL    Comment: CRITICAL RESULT CALLED TO, READ BACK BY AND VERIFIED WITH: EVANS,H. AT 2054  ON 10/16/2018 BY EVA Performed at [facility], [address], [place], [place] [address]   CBC with Differential/Platelet     Status: Abnormal   Collection Time: 10/16/18  7:56 PM  Result Value Ref Range   WBC 7.3 4.0 - 10.5 K/uL   RBC 3.21 (L) 4.22 - 5.81 MIL/uL   Hemoglobin 9.4 (L) 13.0 - 17.0 g/dL   HCT 31.3 (L) 39.0 - 52.0 %   MCV 97.5 80.0 - 100.0 fL   MCH 29.3 26.0 - 34.0 pg   MCHC 30.0 30.0 - 36.0 g/dL   RDW 14.3 11.5 - 15.5 %   Platelets 93 (L) 150 - 400 K/uL    Comment: SPECIMEN CHECKED FOR CLOTS   nRBC 0.0 0.0 - 0.2 %   Neutrophils Relative % 90 %   Neutro Abs 6.6 1.7 - 7.7 K/uL   Lymphocytes Relative 3 %   Lymphs Abs 0.2 (L) 0.7 - 4.0 K/uL   Monocytes Relative 4 %   Monocytes Absolute 0.3 0.1 - 1.0 K/uL   Eosinophils Relative 3 %   Eosinophils Absolute 0.2 0.0 - 0.5 K/uL   Basophils Relative 0 %   Basophils Absolute 0.0 0.0 - 0.1 K/uL   Immature  Granulocytes 0 %   Abs Immature Granulocytes 0.03 0.00 - 0.07 K/uL    Comment: Performed at [facility], [address] [place], [place] [address]  Hepatic function panel     Status: Abnormal   Collection Time: 10/16/18  7:56 PM  Result Value Ref Range   Total Protein 6.2 (L) 6.5 - 8.1 g/dL   Albumin 3.2 (L) 3.5 - 5.0 g/dL   AST 32 15 - 41 U/L   ALT 17 0 - 44 U/L   Alkaline Phosphatase 56 38 - 126 U/L   Total Bilirubin 0.9 0.3 - 1.2 mg/dL   Bilirubin, Direct 0.3 (H) 0.0 - 0.2 mg/dL   Indirect Bilirubin 0.6 0.3 - 0.9 mg/dL    Comment: Performed at [facility], [address], [place], [place] [address]  Glucose, capillary     Status: Abnormal   Collection Time: 10/17/18 12:01 AM  Result Value Ref Range   Glucose-Capillary 186 (H) 70 - 99 mg/dL   Comment 1 Notify RN    Comment 2 Document in Chart   Culture, blood (routine x 2)     Status: None (Preliminary result)   Collection Time: 10/17/18  1:43 AM  Result Value Ref Range   Specimen Description BLOOD LEFT HAND    Special Requests      BOTTLES DRAWN AEROBIC AND ANAEROBIC Blood Culture adequate volume   Culture      NO GROWTH 1 DAY Performed at [facility], [address] [place], [place] [address]    Report Status PENDING   Troponin I - Now Then Q6H     Status: Abnormal   Collection Time: 10/17/18  1:46 AM  Result Value Ref Range   Troponin I 0.09 (HH) <0.03 ng/mL    Comment: CRITICAL VALUE NOTED.  VALUE IS CONSISTENT WITH PREVIOUSLY REPORTED AND CALLED VALUE. Performed at [facility], [address] [place], [place] [address]   Culture, blood (routine x 2)     Status: None (Preliminary result)   Collection Time: 10/17/18  1:46 AM  Result Value Ref Range   Specimen Description BLOOD RIGHT HAND    Special Requests      BOTTLES DRAWN AEROBIC AND ANAEROBIC Blood Culture results may not be optimal due to an  excessive volume of blood received in culture bottles   Culture      NO GROWTH 1 DAY Performed at [facility], [address], [place], [place] [address]    Report Status PENDING   Protime-INR     Status: None   Collection Time: 10/17/18  1:47 AM  Result Value Ref Range   Prothrombin Time 15.1 11.4 - 15.2 seconds   INR 1.20     Comment: Performed at [facility], [address], [place], [place] [address]  Renal function panel     Status: Abnormal   Collection Time: 10/17/18  1:47 AM  Result Value Ref Range   Sodium 143 135 - 145 mmol/L   Potassium 4.0 3.5 - 5.1 mmol/L   Chloride 111 98 - 111 mmol/L   CO2 24 22 - 32 mmol/L   Glucose, Bld 198 (H) 70 - 99 mg/dL   BUN 81 (H) 8 - 23 mg/dL   Creatinine, Ser 1.77 (H) 0.61 - 1.24 mg/dL   Calcium 8.1 (L) 8.9 - 10.3 mg/dL   Phosphorus 3.2 2.5 - 4.6 mg/dL   Albumin 3.1 (L) 3.5 - 5.0 g/dL   GFR calc non Af Amer 36 (L) >60 mL/min   GFR calc Af Amer 41 (L) >60 mL/min   Anion gap 8 5 - 15    Comment: Performed at [facility], [address], [place], [place] [address]  CBC     Status: Abnormal   Collection Time: 10/17/18  1:47 AM  Result Value Ref Range   WBC 7.4 4.0 - 10.5 K/uL   RBC 3.11 (L) 4.22 - 5.81 MIL/uL   Hemoglobin 9.2 (L) 13.0 - 17.0 g/dL   HCT 30.1 (L) 39.0 - 52.0 %   MCV 96.8 80.0 - 100.0 fL   MCH 29.6 26.0 - 34.0 pg   MCHC 30.6 30.0 - 36.0 g/dL   RDW 14.1 11.5 - 15.5 %   Platelets 90 (L) 150 - 400 K/uL    Comment: SPECIMEN CHECKED FOR CLOTS   nRBC 0.0 0.0 - 0.2 %    Comment: Performed at [facility], [address], [place], [place] [address]  Triglycerides     Status: None   Collection Time: 10/17/18  1:52 AM  Result Value Ref Range   Triglycerides 71 <150 mg/dL    Comment: Performed at [facility], [address], [place], [place] [address]  Culture, respiratory (non-expectorated)     Status: None (Preliminary result)   Collection Time: 10/17/18  3:32 AM  Result Value Ref Range   Specimen Description      TRACHEAL ASPIRATE Performed at [facility], [address], [place], [place] [address]    Special Requests       NONE Performed at [facility], [address], [place], [place] [address]    Gram Stain      MODERATE WBC PRESENT, PREDOMINANTLY PMN MODERATE GRAM NEGATIVE RODS Performed at [facility], [address] [place], [place] [address]    Culture PENDING    Report Status PENDING   Blood gas, arterial     Status: Abnormal   Collection Time: 10/17/18  3:45 AM  Result Value Ref Range   FIO2 35.00    Delivery systems VENTILATOR    Mode PRESSURE REGULATED VOLUME CONTROL    VT 540 mL   LHR 16 resp/min   Peep/cpap 5.0 cm H20   pH, Arterial 7.483 (H) 7.350 - 7.450   pCO2 arterial 36.1 32.0 -  48.0 mmHg   pO2, Arterial 102 83.0 - 108.0 mmHg   Bicarbonate 27.7 20.0 - 28.0 mmol/L   Acid-Base Excess 3.4 (H) 0.0 - 2.0 mmol/L   O2 Saturation 97.9 %   Patient temperature 37.8    Collection site RIGHT RADIAL    Drawn by [id number]    Sample type ARTERIAL    Allens test (pass/fail) PASS PASS    Comment: Performed at [facility], [address] [place], [place] [address]  Glucose, capillary     Status: Abnormal   Collection Time: 10/17/18  4:41 AM  Result Value Ref Range   Glucose-Capillary 191 (H) 70 - 99 mg/dL  Glucose, capillary     Status: Abnormal   Collection Time: 10/17/18  7:51 AM  Result Value Ref Range   Glucose-Capillary 185 (H) 70 - 99 mg/dL   Comment 1 Notify RN    Comment 2 Document in Chart   Troponin I - Now Then Q6H     Status: Abnormal   Collection Time: 10/17/18  9:06 AM  Result Value Ref Range   Troponin I 0.05 (HH) <0.03 ng/mL    Comment: CRITICAL VALUE NOTED.  VALUE IS CONSISTENT WITH PREVIOUSLY REPORTED AND CALLED VALUE. Performed at [facility], [address], [place], [place] [address]   Glucose, capillary     Status: Abnormal   Collection Time: 10/17/18 11:54 AM  Result Value Ref Range   Glucose-Capillary 190 (H) 70 - 99 mg/dL   Comment 1 Notify RN    Comment 2 Document in Chart   Glucose, capillary     Status: Abnormal   Collection Time: 10/17/18  4:21 PM   Result Value Ref Range   Glucose-Capillary 232 (H) 70 - 99 mg/dL   Comment 1 Notify RN    Comment 2 Document in Chart   Glucose, capillary     Status: Abnormal   Collection Time: 10/17/18  7:46 PM  Result Value Ref Range   Glucose-Capillary 232 (H) 70 - 99 mg/dL  Glucose, capillary     Status: Abnormal   Collection Time: 10/18/18 12:53 AM  Result Value Ref Range   Glucose-Capillary 187 (H) 70 - 99 mg/dL  Glucose, capillary     Status: Abnormal   Collection Time: 10/18/18  3:56 AM  Result Value Ref Range   Glucose-Capillary 181 (H) 70 - 99 mg/dL  Protime-INR     Status: Abnormal   Collection Time: 10/18/18  4:14 AM  Result Value Ref Range   Prothrombin Time 15.7 (H) 11.4 - 15.2 seconds   INR 1.26     Comment: Performed at [facility], [address] [place], [place] [address]  Renal function panel     Status: Abnormal   Collection Time: 10/18/18  4:14 AM  Result Value Ref Range   Sodium 149 (H) 135 - 145 mmol/L   Potassium 3.2 (L) 3.5 - 5.1 mmol/L    Comment: DELTA CHECK NOTED   Chloride 119 (H) 98 - 111 mmol/L   CO2 23 22 - 32 mmol/L   Glucose, Bld 202 (H) 70 - 99 mg/dL   BUN 66 (H) 8 - 23 mg/dL   Creatinine, Ser 1.38 (H) 0.61 - 1.24 mg/dL   Calcium 8.1 (L) 8.9 - 10.3 mg/dL   Phosphorus 2.9 2.5 - 4.6 mg/dL   Albumin 2.6 (L) 3.5 - 5.0 g/dL   GFR calc non Af Amer 48 (L) >60 mL/min   GFR calc Af Amer 56 (L) >60 mL/min  Anion gap 7 5 - 15    Comment: Performed at [facility], [address], [place], [place] [address]  CBC     Status: Abnormal   Collection Time: 10/18/18  4:14 AM  Result Value Ref Range   WBC 10.4 4.0 - 10.5 K/uL   RBC 3.30 (L) 4.22 - 5.81 MIL/uL   Hemoglobin 9.6 (L) 13.0 - 17.0 g/dL   HCT 32.1 (L) 39.0 - 52.0 %   MCV 97.3 80.0 - 100.0 fL   MCH 29.1 26.0 - 34.0 pg   MCHC 29.9 (L) 30.0 - 36.0 g/dL   RDW 14.2 11.5 - 15.5 %   Platelets 83 (L) 150 - 400 K/uL    Comment: SPECIMEN CHECKED FOR CLOTS Immature Platelet Fraction may be clinically  indicated, consider ordering this additional test [id number] CONSISTENT WITH PREVIOUS RESULT    nRBC 0.2 0.0 - 0.2 %    Comment: Performed at [facility], [address] [place], [place] [address]  Blood gas, arterial     Status: Abnormal   Collection Time: 10/18/18  4:20 AM  Result Value Ref Range   FIO2 35.00    Delivery systems VENTILATOR    Mode PRESSURE REGULATED VOLUME CONTROL    VT 540 mL   LHR 16 resp/min   Peep/cpap 5.0 cm H20   pH, Arterial 7.473 (H) 7.350 - 7.450   pCO2 arterial 35.8 32.0 - 48.0 mmHg   pO2, Arterial 93.2 83.0 - 108.0 mmHg   Bicarbonate 26.9 20.0 - 28.0 mmol/L   Acid-Base Excess 2.5 (H) 0.0 - 2.0 mmol/L   O2 Saturation 97.6 %   Patient temperature 37.8    Collection site RIGHT RADIAL    Drawn by [id number]    Sample type ARTERIAL    Allens test (pass/fail) PASS PASS    Comment: Performed at [facility], [address], [place], [place] [address]    ABGS Recent Labs    10/18/18 0420  PHART 7.473*  PO2ART 93.2  HCO3 26.9   CULTURES Recent Results (from the past 240 hour(s))  MRSA PCR Screening     Status: None   Collection Time: 10/12/18  5:36 AM  Result Value Ref Range Status   MRSA by PCR NEGATIVE NEGATIVE Final    Comment:        The GeneXpert MRSA Assay (FDA approved for NASAL specimens only), is one component of a comprehensive MRSA colonization surveillance program. It is not intended to diagnose MRSA infection nor to guide or monitor treatment for MRSA infections. Performed at [facility], [address] [place], [place] [address]   Culture, blood (routine x 2)     Status: None (Preliminary result)   Collection Time: 10/17/18  1:43 AM  Result Value Ref Range Status   Specimen Description BLOOD LEFT HAND  Final   Special Requests   Final    BOTTLES DRAWN AEROBIC AND ANAEROBIC Blood Culture adequate volume   Culture   Final    NO GROWTH 1 DAY Performed at [facility], [address], [place], [place] [address]    Report Status  PENDING  Incomplete  Culture, blood (routine x 2)     Status: None (Preliminary result)   Collection Time: 10/17/18  1:46 AM  Result Value Ref Range Status   Specimen Description BLOOD RIGHT HAND  Final   Special Requests   Final    BOTTLES DRAWN AEROBIC AND ANAEROBIC Blood Culture results may not be optimal due to an excessive volume  of blood received in culture bottles   Culture   Final    NO GROWTH 1 DAY Performed at [facility], [address], [place], [place] [address]    Report Status PENDING  Incomplete  Culture, respiratory (non-expectorated)     Status: None (Preliminary result)   Collection Time: 10/17/18  3:32 AM  Result Value Ref Range Status   Specimen Description   Final    TRACHEAL ASPIRATE Performed at [facility], [address], [place], [place] [address]    Special Requests   Final    NONE Performed at [facility], [address] [place], [place] [address]    Gram Stain   Final    MODERATE WBC PRESENT, PREDOMINANTLY PMN MODERATE GRAM NEGATIVE RODS Performed at [facility], [place] [address] [place], [place] [address]    Culture PENDING  Incomplete   Report Status PENDING  Incomplete   Studies/Results: Dg Chest Port 1 View  Result Date: 10/17/2018 CLINICAL DATA:  Ventilator support EXAM: PORTABLE CHEST 1 VIEW COMPARISON:  10/16/2018 FINDINGS: Endotracheal tube tip is 4 cm above the carina. Nasogastric tube enters the stomach. Edema pattern persists, with left lower lobe collapse and a small left effusion. IMPRESSION: Persistent edema pattern with left lower lobe collapse and left effusion. Electronically Signed   By: Nelson Chimes M.D.   On: 10/17/2018 06:55    Medications:  Prior to Admission:  Medications Prior to Admission  Medication Sig Dispense Refill Last Dose  . carvedilol (COREG) 6.25 MG tablet TAKE (1) TABLET BY MOUTH TWICE DAILY WTIH A MEAL. (Patient taking differently: Take 6.25 mg by mouth 2 (two) times daily with a meal. TAKE (1) TABLET BY  MOUTH TWICE DAILY WTIH A MEAL.) 60 tablet 6 10/08/2018 at Unknown time  . furosemide (LASIX) 20 MG tablet TAKE 20 MG EVERY OTHER DAY ALTERNATING WITH 30 MG EVERY OTHER DAY 135 tablet 1 09/15/2018 at Unknown time  . HYDROcodone-acetaminophen (NORCO) 5-325 MG per tablet Take 1-2 tablets by mouth every 4 (four) hours as needed. (Patient taking differently: Take 1 tablet by mouth every 6 (six) hours as needed for moderate pain. ) 40 tablet 0 10/10/2018 at Unknown time  . Ipratropium-Albuterol (COMBIVENT RESPIMAT) 20-100 MCG/ACT AERS respimat Inhale 1 puff into the lungs as needed. (Patient taking differently: Inhale 1 puff into the lungs 4 (four) times daily. ) 1 Inhaler 1 10/06/2018 at Unknown time  . loperamide (IMODIUM) 2 MG capsule Take 1 capsule (2 mg total) by mouth as needed for diarrhea or loose stools. (Patient taking differently: Take 4 mg by mouth daily as needed for diarrhea or loose stools. ) 20 capsule 2 09/27/2018 at Unknown time  . potassium chloride SA (K-DUR,KLOR-CON) 20 MEQ tablet Take 0.5 tablets (10 mEq total) by mouth daily. (Patient taking differently: Take 20 mEq by mouth 2 (two) times daily. ) 90 tablet 3 09/23/2018 at Unknown time  . SSD 1 % cream Apply 1 application topically 3 (three) times daily as needed. Applied to legs as needed   Taking  . albuterol (PROVENTIL) (2.5 MG/3ML) 0.083% nebulizer solution Inhale 3 mLs into the lungs every 4 (four) hours as needed for wheezing or shortness of breath.    Taking  . diltiazem (CARDIZEM CD) 120 MG 24 hr capsule TAKE (1) CAPSULE BY MOUTH TWICE DAILY. 180 capsule 0  at Unknown time  . diltiazem (CARDIZEM CD) 120 MG 24 hr capsule TAKE (1) CAPSULE BY MOUTH TWICE DAILY. 180 capsule 3   .  vitamin B-12 (CYANOCOBALAMIN) 1000 MCG tablet Take 1 tablet (1,000 mcg total) by mouth daily. 60 tablet 2 Taking  . warfarin (COUMADIN) 5 MG tablet Take 0.5-1 tablets (2.5-5 mg total) by mouth See admin instructions. TAKE 1/2 TABLET (2.'5mg'$  total) BY MOUTH  DAILY, EXCEPT TAKE 1 TABLET ('5mg'$  total) ON MONDAYS, WEDNESDAYS AND FRIDAYS. (Patient not taking: Reported on 09/21/2018) 75 tablet 3 Not Taking at Unknown time   Scheduled: . chlorhexidine gluconate (MEDLINE KIT)  15 mL Mouth Rinse BID  . feeding supplement (PRO-STAT SUGAR FREE 64)  30 mL Oral Daily  . feeding supplement (VITAL AF 1.2 CAL)  1,000 mL Per Tube Q24H  . Influenza vac split quadrivalent PF  0.5 mL Intramuscular Tomorrow-1000  . insulin aspart  0-9 Units Subcutaneous Q4H  . insulin glargine  12 Units Subcutaneous QHS  . ipratropium  0.5 mg Nebulization Q6H  . ketoconazole   Topical BID  . levalbuterol  0.63 mg Nebulization Q6H  . mouth rinse  15 mL Mouth Rinse 10 times per day  . methylPREDNISolone (SOLU-MEDROL) injection  40 mg Intravenous Q8H  . metoprolol tartrate  5 mg Intravenous Q6H  . pantoprazole (PROTONIX) IV  40 mg Intravenous Q24H  . sodium chloride flush  3 mL Intravenous Q12H  . Warfarin - Pharmacist Dosing Inpatient   Does not apply Q24H   Continuous: . sodium chloride    . amiodarone 30 mg/hr (10/18/18 0738)  . ampicillin-sulbactam (UNASYN) IV 3 g (10/18/18 0505)  . dextrose 5 % and 0.9% NaCl 50 mL/hr at 10/18/18 0600  . diltiazem (CARDIZEM) infusion Stopped (10/16/18 2223)  . propofol (DIPRIVAN) infusion 10 mcg/kg/min (10/18/18 0600)   PXT:GGYIRS chloride, acetaminophen, fentaNYL (SUBLIMAZE) injection, fentaNYL (SUBLIMAZE) injection, HYDROcodone-acetaminophen, levalbuterol, LORazepam, sodium chloride flush  Assesment: He was admitted with atrial fib with RVR.  He was being treated for that when he had aspiration and developed increasing respiratory distress which culminated in him being intubated and placed on mechanical ventilation.  He has what appears to be left lower lobe pneumonia/collapse.  This is from aspiration for which she is being treated.  He has had more fever in the last 36 hours and cultures are pending.  He had acute on chronic renal failure  and his renal function is improving  He had an episode of rectal bleeding.  At that time his INR was supratherapeutic and his platelets were low.  He was given fresh frozen plasma and platelets and he has improved.  He has chronic atrial fib he is on amiodarone but his heart rate still about 120.  His blood pressure is marginal at best in the 80s  He seems to be having more trouble with secretions  He has acute on chronic combined systolic and diastolic heart failure  He had been anticoagulated but that has been held in the face of hematochezia Principal Problem:   Atrial fibrillation with RVR ([place]) Active Problems:   Long term current use of anticoagulant therapy   Aortic stenosis   CKD (chronic kidney disease) stage 3, GFR 30-59 ml/min (HCC)   Acute on chronic combined systolic and diastolic congestive heart failure (HCC)   Hematochezia   Supratherapeutic INR    Plan: Continue current treatments.  Set for palliative care consultation today.  His prognosis is poor    LOS: 7 days   Alonza Bogus 10/18/2018, 7:42 AM

## 2018-10-18 NOTE — Progress Notes (Signed)
PROGRESS NOTE  Cory Ryan  OZH:[id number]  DOB: 17-Apr-1941  DOA: 09/26/2018 PCP: [name], MD   Brief Admission Hx: Cory Ryan is a 78 y.o. male with medical history significant of chronic systolic congestive heart failure, chronic kidney disease, COPD, A. fib, chronic anticoagulation on Coumadin sent in from his primary care physician's office for A. fib with RVR.  Pt had a suspected aspiration event and developed acute respiratory failure and was subsequently intubated.  Pt had an acute rectal bleed and warfarin has been reversed    MDM/Assessment & Plan:   1. Acute on chronic respiratory failure with hypoxia.  Related to pneumonia and decompensated CHF.  Currently on mechanical ventilation.  Pulmonology following. 2. Atrial fibrillation with RVR -he was previously on diltiazem infusion, which was weaned off.  He became tachycardic on 1/5 and was started on amiodarone infusion.  Heart rate has since been labile.  His low blood pressure limits further adjustment of rate control medications.  Not a candidate for anticoagulation due to GI bleeding.   3. Acute on chronic combined systolic and diastolic CHF -started on intravenous Lasix for volume overload.  Overall volume status appears to be improving.  Lasix currently on hold. 4. Elevated troponin - suspect this is demand ischemia from respiratory distress and acute CHF.  Cardiology team is following.   5. Multifocal pneumonia - Continue IV unasyn.  Continue supportive therapy.  Currently on vent. Pulmonary following.  He is also on bronchodilators and IV steroids. 6. Hematochezia - Pt had spontaneous bout of gross rectal bleeding.  He received vit K and FFP to reverse INR.  Overall bleeding appears to have stopped.  He remains critically ill.  Seen by GI not felt to be a candidate for endoscopic evaluation at this time.  Hemoglobin has been stable     7. Chronic aortic stenosis - stable. Follow.   8. Chronic anticoagulation - He had  been off warfarin for last few days and INR has been reversed with FFP and vitamin K 9. Stage 3 CKD -renal function is currently stable.  Continue to monitor 10. Goals of care.  Palliative care has met with the patient's son and daughter.  They agree to DNR status and are likely leaning towards compassionate extubation once further family members have arrived.  Continue current treatments for now.  DVT prophylaxis: SCDs Code Status: DNR Family Communication: Palliative care has met with family to discuss goals of care Disposition Plan: Palliative care has met with family and they are leaning towards compassionate extubation.  Antibiotics  Unasyn 1/3 >  Subjective: Sedated and intubated on ventilator  Objective: Vitals:   10/18/18 1300 10/18/18 1500 10/18/18 1600 10/18/18 1634  BP: (!) 95/50 (!) 83/52 (!) 92/55   Pulse: (!) 53 96 71   Resp: '18 20 15   '$ Temp:    99.6 F (37.6 C)  TempSrc:    Axillary  SpO2: 96% 97% 95%   Weight:      Height:        Intake/Output Summary (Last 24 hours) at 10/18/2018 1640 Last data filed at 10/18/2018 1600 Gross per 24 hour  Intake 4473.69 ml  Output 1150 ml  Net 3323.69 ml   Filed Weights   10/15/18 0423 10/16/18 0453 10/18/18 0500  Weight: 63.7 kg 67 kg 65.1 kg    Exam:  General exam: Intubated and sedated Respiratory system: Clear bilaterally. Respiratory effort normal. Cardiovascular system: Irregular rate and rhythm. No murmurs, rubs, gallops. Gastrointestinal system:  Abdomen is nondistended, soft and nontender. No organomegaly or masses felt. Normal bowel sounds heard. Central nervous system: Unable to assess due to mental status Extremities: No C/C/E, +pedal pulses Skin: No rashes, lesions or ulcers Psychiatry: Sedated.      Data Reviewed: Basic Metabolic Panel: Recent Labs  Lab 10/12/18 0331 10/13/18 0547 10/14/18 0436 10/14/18 2334  10/16/18 0025 10/16/18 0733 10/16/18 1955 10/17/18 0147 10/18/18 0414  NA 138 137  139  --    < > 141 141 145 143 149*  K 3.2* 3.7 3.8 3.7   < > 4.0 3.4* 3.7 4.0 3.2*  CL 102 103 106  --    < > 109 108 113* 111 119*  CO2 25 25 21*  --    < > '22 24 23 24 23  '$ GLUCOSE 104* 115* 171*  --    < > 225* 225* 228* 198* 202*  BUN 26* 28* 37*  --    < > 80* 80* 82* 81* 66*  CREATININE 1.34* 1.43* 1.90*  --    < > 2.58* 2.30* 1.96* 1.77* 1.38*  CALCIUM 8.4* 8.4* 8.4*  --    < > 7.6* 7.7* 7.9* 8.1* 8.1*  MG 2.1 2.2 2.3 2.6*  --   --   --  2.9*  --   --   PHOS  --   --   --   --   --   --  4.6  --  3.2 2.9   < > = values in this interval not displayed.   Liver Function Tests: Recent Labs  Lab 10/13/18 0547 10/14/18 0436 10/16/18 0025 10/16/18 0733 10/16/18 1956 10/17/18 0147 10/18/18 0414  AST 15 14* 22  --  32  --   --   ALT '12 12 10  '$ --  17  --   --   ALKPHOS 67 62 62  --  56  --   --   BILITOT 1.2 1.1 0.7  --  0.9  --   --   PROT 6.2* 6.4* 6.3*  --  6.2*  --   --   ALBUMIN 3.1* 3.0* 3.0* 3.2* 3.2* 3.1* 2.6*   No results for input(s): LIPASE, AMYLASE in the last 168 hours. No results for input(s): AMMONIA in the last 168 hours. CBC: Recent Labs  Lab 10/16/18 0025 10/16/18 0733 10/16/18 1956 10/17/18 0147 10/18/18 0414  WBC 10.3 6.7 7.3 7.4 10.4  NEUTROABS 9.6*  --  6.6  --   --   HGB 10.3* 9.5* 9.4* 9.2* 9.6*  HCT 33.5* 30.8* 31.3* 30.1* 32.1*  MCV 96.0 96.6 97.5 96.8 97.3  PLT 110* 90* 93* 90* 83*   Cardiac Enzymes: Recent Labs  Lab 09/27/2018 2121 10/12/18 0331 10/16/18 1955 10/17/18 0146 10/17/18 0906  TROPONINI 0.30* 0.30* 0.08* 0.09* 0.05*   CBG (last 3)  Recent Labs    10/18/18 0812 10/18/18 1131 10/18/18 1624  GLUCAP 172* 153* 192*   Recent Results (from the past 240 hour(s))  MRSA PCR Screening     Status: None   Collection Time: 10/12/18  5:36 AM  Result Value Ref Range Status   MRSA by PCR NEGATIVE NEGATIVE Final    Comment:        The GeneXpert MRSA Assay (FDA approved for NASAL specimens only), is one component of  a comprehensive MRSA colonization surveillance program. It is not intended to diagnose MRSA infection nor to guide or monitor treatment for MRSA infections. Performed at [facility]  [address] [place], [place] [address]   Culture, blood (routine x 2)     Status: None (Preliminary result)   Collection Time: 10/17/18  1:43 AM  Result Value Ref Range Status   Specimen Description BLOOD LEFT HAND  Final   Special Requests   Final    BOTTLES DRAWN AEROBIC AND ANAEROBIC Blood Culture adequate volume   Culture   Final    NO GROWTH 1 DAY Performed at [facility], [address] [place], [place] [address]    Report Status PENDING  Incomplete  Culture, blood (routine x 2)     Status: None (Preliminary result)   Collection Time: 10/17/18  1:46 AM  Result Value Ref Range Status   Specimen Description BLOOD RIGHT HAND  Final   Special Requests   Final    BOTTLES DRAWN AEROBIC AND ANAEROBIC Blood Culture results may not be optimal due to an excessive volume of blood received in culture bottles   Culture   Final    NO GROWTH 1 DAY Performed at [facility], [address] [place], [place] [address]    Report Status PENDING  Incomplete  Culture, respiratory (non-expectorated)     Status: None (Preliminary result)   Collection Time: 10/17/18  3:32 AM  Result Value Ref Range Status   Specimen Description   Final    TRACHEAL ASPIRATE Performed at [facility], [address] [place], [place] [address]    Special Requests   Final    NONE Performed at [facility], [address], [place], [place] [address]    Gram Stain   Final    MODERATE WBC PRESENT, PREDOMINANTLY PMN MODERATE GRAM NEGATIVE RODS Performed at [facility], [place] [address] [place],  [address]    Culture ABUNDANT PSEUDOMONAS AERUGINOSA  Final   Report Status PENDING  Incomplete     Studies: Dg Chest Port 1 View  Result Date: 10/17/2018 CLINICAL DATA:  Ventilator support EXAM: PORTABLE CHEST 1 VIEW  COMPARISON:  10/16/2018 FINDINGS: Endotracheal tube tip is 4 cm above the carina. Nasogastric tube enters the stomach. Edema pattern persists, with left lower lobe collapse and a small left effusion. IMPRESSION: Persistent edema pattern with left lower lobe collapse and left effusion. Electronically Signed   By: Nelson Chimes M.D.   On: 10/17/2018 06:55   Scheduled Meds: . chlorhexidine gluconate (MEDLINE KIT)  15 mL Mouth Rinse BID  . feeding supplement (PRO-STAT SUGAR FREE 64)  30 mL Oral Daily  . feeding supplement (VITAL AF 1.2 CAL)  1,000 mL Per Tube Q24H  . Influenza vac split quadrivalent PF  0.5 mL Intramuscular Tomorrow-1000  . insulin aspart  0-9 Units Subcutaneous Q4H  . insulin glargine  12 Units Subcutaneous QHS  . ipratropium  0.5 mg Nebulization Q6H  . ketoconazole   Topical BID  . levalbuterol  0.63 mg Nebulization Q6H  . mouth rinse  15 mL Mouth Rinse 10 times per day  . methylPREDNISolone (SOLU-MEDROL) injection  40 mg Intravenous Q8H  . metoprolol tartrate  2.5 mg Intravenous Q6H  . pantoprazole (PROTONIX) IV  40 mg Intravenous Q24H  . sodium chloride flush  3 mL Intravenous Q12H  . Warfarin - Pharmacist Dosing Inpatient   Does not apply Q24H   Continuous Infusions: . sodium chloride    . amiodarone 30 mg/hr (10/18/18 1600)  . ampicillin-sulbactam (UNASYN) IV 3 g (10/18/18 1635)  . dextrose 5 % 1,000 mL with potassium chloride 40 mEq infusion 125 mL/hr  at 10/18/18 1600  . propofol (DIPRIVAN) infusion 10 mcg/kg/min (10/18/18 1600)    Principal Problem:   Atrial fibrillation with RVR (HCC) Active Problems:   Long term current use of anticoagulant therapy   Aortic stenosis   CKD (chronic kidney disease) stage 3, GFR 30-59 ml/min (HCC)   Acute on chronic combined systolic and diastolic congestive heart failure (HCC)   Hematochezia   Supratherapeutic INR  Critical Care Time spent: 30 mins  Kathie Dike, MD [facility]  If 7PM-7AM, please contact  night-coverage [email or web address] Password [email or web address] 10/18/2018, 4:40 PM    LOS: 7 days

## 2018-10-18 NOTE — Progress Notes (Signed)
Night shift ICU coverage note.  The patient's HR has been persistently in the 160s to 180s. His BP is 97/61 mmHg. His HR improved to the low 100s after given after metoprolol 2.5 mg IVP x2. I will change metoprolol from 5 mg IVP every 6 hours to 2.5 mg IVP every 3 hours.  Tennis Must, MD

## 2018-10-18 NOTE — Progress Notes (Signed)
Notified Midlevel provider and Hospitalist due to Heart rate and Blood pressure. Orders received and completed. See eMAR and physician note. Family is aware of current status and actions taken for this. Patient continues to be sedated and will respond to stimuli slightly. Heart rate has improved and so has Blood pressure. Amiodarone continues after physician/midlevel inquiry.

## 2018-10-18 NOTE — Progress Notes (Signed)
PMT consult received and chart reviewed. Discussed with RN. No family at bedside. Spoke with son, Matthew who requests I call his sister to arrange time for [place] meeting. Voicemail left for patient's daughter, Altha Harm to arrange [place]. Called [place] back and encouraged him to also reach out to his sister to arrange time to meet with palliative provider this afternoon. Notified RN to page PMT provider if family arrives at bedside.   NO CHARGE  Ihor Dow, FNP-C Palliative Medicine Team  Phone: [phone] Fax: [phone]

## 2018-10-18 NOTE — Progress Notes (Signed)
HR and BP are improved and holding constant at this time HR 104  Sedation is restarted at  2.5 mcg/kg/min O2 on ventilator is now at 50% and HR responded by slowing approximately 10 BPM average Respiratory rate improved down to 18 from 24 breathes per min. HR remains 101- 121 BPM

## 2018-10-18 NOTE — Progress Notes (Addendum)
Night shift floor coverage note.  The patient has been having A. fib with RVR in the 140s to 170s.  He has been hypotensive with his systolic pressures in the 80s and 90s.  Propofol sedation has been held due to hypotension.  Despite this, he has continued to be sedated.  A NS 500 mL bolus was ordered, Midodrin 10 mg p.o. 3 times daily was started, KCl 20 mEq liquid x1 and a single dose of digoxin 0.5 mg IVP was given.  His CODE STATUS was changed to DNR earlier today.  His family has been made aware of the worsening clinical situation.    Tennis Must, MD

## 2018-10-19 DIAGNOSIS — Z515 Encounter for palliative care: Secondary | ICD-10-CM

## 2018-10-19 LAB — TRIGLYCERIDES: Triglycerides: 147 mg/dL (ref <150)

## 2018-10-19 LAB — RENAL FUNCTION PANEL
Albumin: 2.1 g/dL — ABNORMAL LOW (ref 3.5–5.0)
Anion gap: 5 (ref 5–15)
BUN: 71 mg/dL — AB (ref 8–23)
CO2: 21 mmol/L — ABNORMAL LOW (ref 22–32)
Calcium: 7.3 mg/dL — ABNORMAL LOW (ref 8.9–10.3)
Chloride: 116 mmol/L — ABNORMAL HIGH (ref 98–111)
Creatinine, Ser: 1.59 mg/dL — ABNORMAL HIGH (ref 0.61–1.24)
GFR calc Af Amer: 47 mL/min — ABNORMAL LOW (ref >60)
GFR calc non Af Amer: 41 mL/min — ABNORMAL LOW (ref >60)
GLUCOSE: 250 mg/dL — AB (ref 70–99)
Phosphorus: 2.4 mg/dL — ABNORMAL LOW (ref 2.5–4.6)
Potassium: 4.6 mmol/L (ref 3.5–5.1)
Sodium: 142 mmol/L (ref 135–145)

## 2018-10-19 LAB — BLOOD GAS, ARTERIAL
Acid-Base Excess: 0 mmol/L (ref 0.0–2.0)
Bicarbonate: 24.8 mmol/L (ref 20.0–28.0)
Drawn by: 2223
FIO2: 50
MECHVT: 540 mL
O2 SAT: 99.4 %
PCO2 ART: 33.2 mmHg (ref 32.0–48.0)
PEEP: 5 cmH2O
RATE: 16 resp/min
pH, Arterial: 7.462 — ABNORMAL HIGH (ref 7.350–7.450)
pO2, Arterial: 151 mmHg — ABNORMAL HIGH (ref 83.0–108.0)

## 2018-10-19 LAB — GLUCOSE, CAPILLARY
GLUCOSE-CAPILLARY: 205 mg/dL — AB (ref 70–99)
GLUCOSE-CAPILLARY: 201 mg/dL — AB (ref 70–99)
Glucose-Capillary: 210 mg/dL — ABNORMAL HIGH (ref 70–99)
Glucose-Capillary: 222 mg/dL — ABNORMAL HIGH (ref 70–99)
Glucose-Capillary: 207 mg/dL — ABNORMAL HIGH (ref 70–99)
Glucose-Capillary: 207 mg/dL — ABNORMAL HIGH (ref 70–99)

## 2018-10-19 LAB — CULTURE, RESPIRATORY W GRAM STAIN

## 2018-10-19 LAB — PROTIME-INR
INR: 1.33
Prothrombin Time: 16.4 seconds — ABNORMAL HIGH (ref 11.4–15.2)

## 2018-10-19 MED ORDER — METOPROLOL TARTRATE 5 MG/5ML IV SOLN
2.5000 mg | INTRAVENOUS | Status: DC
  Administered 2018-10-19 – 2018-10-20 (×7): 2.5 mg via INTRAVENOUS
  Filled 2018-10-19 (×8): qty 5

## 2018-10-19 NOTE — Progress Notes (Signed)
PROGRESS NOTE  Cory Ryan  ZES:[id number]  DOB: 05/04/1941  DOA: 10/09/2018 PCP: [name], MD   Brief Admission Hx: Cory Ryan is a 78 y.o. male with medical history significant of chronic systolic congestive heart failure, chronic kidney disease, COPD, A. fib, chronic anticoagulation on Coumadin sent in from his primary care physician's office for A. fib with RVR.  Pt had a suspected aspiration event and developed acute respiratory failure and was subsequently intubated.  Pt had an acute rectal bleed and warfarin has been reversed    MDM/Assessment & Plan:   1. Acute on chronic respiratory failure with hypoxia.  Related to pneumonia and decompensated CHF.  Currently on mechanical ventilation.  Pulmonology following. 2. Atrial fibrillation with RVR -he was previously on diltiazem infusion, which was weaned off.  He became tachycardic on 1/5 and was started on amiodarone infusion.  Heart rate has since been labile.  His low blood pressure limits further adjustment of rate control medications.  Not a candidate for anticoagulation due to GI bleeding.   3. Acute on chronic combined systolic and diastolic CHF -started on intravenous Lasix for volume overload.  Overall volume status appears to be stable Lasix currently on hold. 4. Elevated troponin - suspect this is demand ischemia from respiratory distress and acute CHF.  Cardiology team is following.   5. Multifocal pneumonia - Continue IV unasyn.  Continue supportive therapy.  Currently on vent. Pulmonary following.  He is also on bronchodilators and IV steroids. 6. Hematochezia - Pt had spontaneous bout of gross rectal bleeding.  He received vit K and FFP to reverse INR.  Overall bleeding appears to have stopped.  He remains critically ill.  Seen by GI not felt to be a candidate for endoscopic evaluation at this time.  Hemoglobin has been stable     7. Chronic aortic stenosis - stable. Follow.   8. Chronic anticoagulation - He had been  off warfarin for last few days and INR has been reversed with FFP and vitamin K 9. Stage 3 CKD -renal function is currently stable.  Continue to monitor 10. Goals of care.  Palliative care has met with the patient's son and daughter.  They agree to DNR status and are likely leaning towards compassionate extubation once further family members have arrived.  Hopefully this will occur in the next 24 hours.  Continue current treatments for now.  DVT prophylaxis: SCDs Code Status: DNR Family Communication: Palliative care has met with family to discuss goals of care Disposition Plan: Palliative care has met with family and they are leaning towards compassionate extubation.  Antibiotics  Unasyn 1/3 >  Subjective: Sedated and intubated on ventilator.  Had some issues with hypotension and tachycardia overnight.  Heart rate is now better.  He is required increasing amounts of FiO2.  Objective: Vitals:   10/19/18 1300 10/19/18 1400 10/19/18 1422 10/19/18 1500  BP: (!) 122/58 (!) 109/58  (!) 120/57  Pulse: (!) 50 (!) 52  (!) 51  Resp: _0 Temp:      TempSrc:      SpO2: 100% 100% 100% 100%  Weight:      Height:        Intake/Output Summary (Last 24 hours) at 10/19/2018 1549 Last data filed at 10/19/2018 1500 Gross per 24 hour  Intake 4174.68 ml  Output 500 ml  Net 3674.68 ml   Filed Weights   10/16/18 0453 10/18/18 0500 10/19/18 0515  Weight: 67 kg 65.1 kg  70.6 kg    Exam:  General exam: Intubated and sedated Respiratory system: Bilateral rhonchi. Respiratory effort normal. Cardiovascular system: Irregular. No murmurs, rubs, gallops. Gastrointestinal system: Abdomen is nondistended, soft and nontender. No organomegaly or masses felt. Normal bowel sounds heard. Central nervous system: Unable to assess due to sedation Extremities: 1+ pedal edema Skin: No rashes, lesions or ulcers Psychiatry: Sedated.    Data Reviewed: Basic Metabolic Panel: Recent Labs  Lab 10/13/18 0547  10/14/18 0436 10/14/18 2334  10/16/18 0733 10/16/18 1955 10/17/18 0147 10/18/18 0414 10/19/18 0413  NA 137 139  --    < > 141 145 143 149* 142  K 3.7 3.8 3.7   < > 3.4* 3.7 4.0 3.2* 4.6  CL 103 106  --    < > 108 113* 111 119* 116*  CO2 25 21*  --    < > _0 21*  GLUCOSE 115* 171*  --    < > 225* 228* 198* 202* 250*  BUN 28* 37*  --    < > 80* 82* 81* 66* 71*  CREATININE 1.43* 1.90*  --    < > 2.30* 1.96* 1.77* 1.38* 1.59*  CALCIUM 8.4* 8.4*  --    < > 7.7* 7.9* 8.1* 8.1* 7.3*  MG 2.2 2.3 2.6*  --   --  2.9*  --   --   --   PHOS  --   --   --   --  4.6  --  3.2 2.9 2.4*   < > = values in this interval not displayed.   Liver Function Tests: Recent Labs  Lab 10/13/18 0547 10/14/18 0436 10/16/18 0025 10/16/18 0733 10/16/18 1956 10/17/18 0147 10/18/18 0414 10/19/18 0413  AST 15 14* 22  --  32  --   --   --   ALT _1 --  17  --   --   --   ALKPHOS 67 62 62  --  56  --   --   --   BILITOT 1.2 1.1 0.7  --  0.9  --   --   --   PROT 6.2* 6.4* 6.3*  --  6.2*  --   --   --   ALBUMIN 3.1* 3.0* 3.0* 3.2* 3.2* 3.1* 2.6* 2.1*   No results for input(s): LIPASE, AMYLASE in the last 168 hours. No results for input(s): AMMONIA in the last 168 hours. CBC: Recent Labs  Lab 10/16/18 0025 10/16/18 0733 10/16/18 1956 10/17/18 0147 10/18/18 0414  WBC 10.3 6.7 7.3 7.4 10.4  NEUTROABS 9.6*  --  6.6  --   --   HGB 10.3* 9.5* 9.4* 9.2* 9.6*  HCT 33.5* 30.8* 31.3* 30.1* 32.1*  MCV 96.0 96.6 97.5 96.8 97.3  PLT 110* 90* 93* 90* 83*   Cardiac Enzymes: Recent Labs  Lab 10/16/18 1955 10/17/18 0146 10/17/18 0906  TROPONINI 0.08* 0.09* 0.05*   CBG (last 3)  Recent Labs    10/19/18 0406 10/19/18 0726 10/19/18 1103  GLUCAP 222* 207* 207*   Recent Results (from the past 240 hour(s))  MRSA PCR Screening     Status: None   Collection Time: 10/12/18  5:36 AM  Result Value Ref Range Status   MRSA by PCR NEGATIVE NEGATIVE Final    Comment:        The GeneXpert MRSA  Assay (FDA approved for NASAL specimens only), is one component of a comprehensive MRSA colonization surveillance program. It is  not intended to diagnose MRSA infection nor to guide or monitor treatment for MRSA infections. Performed at [facility], [address], [place], [place] [address]   Culture, blood (routine x 2)     Status: None (Preliminary result)   Collection Time: 10/17/18  1:43 AM  Result Value Ref Range Status   Specimen Description BLOOD LEFT HAND  Final   Special Requests   Final    BOTTLES DRAWN AEROBIC AND ANAEROBIC Blood Culture adequate volume   Culture   Final    NO GROWTH 2 DAYS Performed at [facility], [address] [place], [place] [address]    Report Status PENDING  Incomplete  Culture, blood (routine x 2)     Status: None (Preliminary result)   Collection Time: 10/17/18  1:46 AM  Result Value Ref Range Status   Specimen Description BLOOD RIGHT HAND  Final   Special Requests   Final    BOTTLES DRAWN AEROBIC AND ANAEROBIC Blood Culture results may not be optimal due to an excessive volume of blood received in culture bottles   Culture   Final    NO GROWTH 2 DAYS Performed at [facility], [address], [place], [place] [address]    Report Status PENDING  Incomplete  Culture, respiratory (non-expectorated)     Status: None   Collection Time: 10/17/18  3:32 AM  Result Value Ref Range Status   Specimen Description   Final    TRACHEAL ASPIRATE Performed at [facility], [address], [place], [place] [address]    Special Requests   Final    NONE Performed at [facility], [address] [place], [place] [address]    Gram Stain   Final    MODERATE WBC PRESENT, PREDOMINANTLY PMN MODERATE GRAM NEGATIVE RODS Performed at [facility], [place] [address], [place], [place] [address]    Culture ABUNDANT PSEUDOMONAS AERUGINOSA  Final   Report Status 10/19/2018 FINAL  Final   Organism ID, Bacteria PSEUDOMONAS AERUGINOSA  Final       Susceptibility   Pseudomonas aeruginosa - MIC*    CEFTAZIDIME 4 SENSITIVE Sensitive     CIPROFLOXACIN <=0.25 SENSITIVE Sensitive     GENTAMICIN <=1 SENSITIVE Sensitive     IMIPENEM 1 SENSITIVE Sensitive     PIP/TAZO 8 SENSITIVE Sensitive     CEFEPIME <=1 SENSITIVE Sensitive     * ABUNDANT PSEUDOMONAS AERUGINOSA     Studies: No results found. Scheduled Meds: . chlorhexidine gluconate (MEDLINE KIT)  15 mL Mouth Rinse BID  . feeding supplement (PRO-STAT SUGAR FREE 64)  30 mL Oral Daily  . feeding supplement (VITAL AF 1.2 CAL)  1,000 mL Per Tube Q24H  . Influenza vac split quadrivalent PF  0.5 mL Intramuscular Tomorrow-1000  . insulin aspart  0-9 Units Subcutaneous Q4H  . insulin glargine  12 Units Subcutaneous QHS  . ipratropium  0.5 mg Nebulization Q6H  . ketoconazole   Topical BID  . levalbuterol  0.63 mg Nebulization Q6H  . mouth rinse  15 mL Mouth Rinse 10 times per day  . methylPREDNISolone (SOLU-MEDROL) injection  40 mg Intravenous Q8H  . metoprolol tartrate  2.5 mg Intravenous Q3H  . midodrine  10 mg Oral TID WC  . pantoprazole (PROTONIX) IV  40 mg Intravenous Q24H  . sodium chloride flush  3 mL Intravenous Q12H  . Warfarin - Pharmacist Dosing Inpatient   Does not apply Q24H   Continuous Infusions: . sodium chloride    .  amiodarone 30 mg/hr (10/19/18 1500)  . ampicillin-sulbactam (UNASYN) IV Stopped (10/19/18 0453)  . dextrose 5 % 1,000 mL with potassium chloride 40 mEq infusion 125 mL/hr at 10/19/18 1500  . propofol (DIPRIVAN) infusion 15 mcg/kg/min (10/19/18 1500)    Principal Problem:   Atrial fibrillation with RVR (HCC) Active Problems:   Long term current use of anticoagulant therapy   Aortic stenosis   CKD (chronic kidney disease) stage 3, GFR 30-59 ml/min (HCC)   Terminal care   Acute combined systolic and diastolic congestive heart failure (HCC)   Hematochezia   Supratherapeutic INR   Palliative care by specialist   Multifocal pneumonia   Acute on  chronic respiratory failure [facility])  Critical Care Time spent: 30 mins  Kathie Dike, MD [facility]  If 7PM-7AM, please contact night-coverage [email or web address] Password [email or web address] 10/19/2018, 3:49 PM    LOS: 8 days

## 2018-10-19 NOTE — Progress Notes (Signed)
Subjective: He came in with atrial fib with RVR.  He has been chronically anticoagulated with Coumadin.  He was improving when he had an episode of aspiration developed acute respiratory failure which culminated in him being intubated and placed on mechanical ventilation.  He has remained on the ventilator since.  He has aspiration pneumonia most marked in the left lower lobe where he may have some mucus plugging.  He had trouble with markedly increased heart rate last night and that is better.  Part of the improvement seems to be in increasing his FiO2 even though his oxygenation looked okay on a lower FiO2.  Family has agreed to DNR status now.  Objective: Vital signs in last 24 hours: Temp:  [97.5 F (36.4 C)-99.7 F (37.6 C)] 97.5 F (36.4 C) (01/08 0728) Pulse Rate:  [31-139] 94 (01/08 0700) Resp:  [15-29] 21 (01/08 0700) BP: (82-117)/(43-71) 102/55 (01/08 0700) SpO2:  [95 %-100 %] 100 % (01/08 0700) FiO2 (%):  [35 %-50 %] 50 % (01/08 0237) Weight:  [70.6 kg] 70.6 kg (01/08 0515) Weight change: 5.5 kg Last BM Date: 10/16/18  Intake/Output from previous day: 01/07 0701 - 01/08 0700 In: 5132.9 [I.V.:3290.1; IV Piggyback:1842.8] Out: 500 [Urine:500]  PHYSICAL EXAM General appearance: He opens his eyes to voice Resp: rhonchi bilaterally Cardio: irregularly irregular rhythm GI: soft, non-tender; bowel sounds normal; no masses,  no organomegaly Extremities: Pretty extensive third spacing of fluid.  He has chronic changes in his legs  Lab Results:  Results for orders placed or performed during the hospital encounter of 10/02/2018 (from the past 48 hour(s))  Glucose, capillary     Status: Abnormal   Collection Time: 10/17/18  7:51 AM  Result Value Ref Range   Glucose-Capillary 185 (H) 70 - 99 mg/dL   Comment 1 Notify RN    Comment 2 Document in Chart   Troponin I - Now Then Q6H     Status: Abnormal   Collection Time: 10/17/18  9:06 AM  Result Value Ref Range   Troponin I 0.05  (HH) <0.03 ng/mL    Comment: CRITICAL VALUE NOTED.  VALUE IS CONSISTENT WITH PREVIOUSLY REPORTED AND CALLED VALUE. Performed at [facility], [address], [place], [place] [address]   Glucose, capillary     Status: Abnormal   Collection Time: 10/17/18 11:54 AM  Result Value Ref Range   Glucose-Capillary 190 (H) 70 - 99 mg/dL   Comment 1 Notify RN    Comment 2 Document in Chart   Glucose, capillary     Status: Abnormal   Collection Time: 10/17/18  4:21 PM  Result Value Ref Range   Glucose-Capillary 232 (H) 70 - 99 mg/dL   Comment 1 Notify RN    Comment 2 Document in Chart   Glucose, capillary     Status: Abnormal   Collection Time: 10/17/18  7:46 PM  Result Value Ref Range   Glucose-Capillary 232 (H) 70 - 99 mg/dL  Glucose, capillary     Status: Abnormal   Collection Time: 10/18/18 12:53 AM  Result Value Ref Range   Glucose-Capillary 187 (H) 70 - 99 mg/dL  Glucose, capillary     Status: Abnormal   Collection Time: 10/18/18  3:56 AM  Result Value Ref Range   Glucose-Capillary 181 (H) 70 - 99 mg/dL  Protime-INR     Status: Abnormal   Collection Time: 10/18/18  4:14 AM  Result Value Ref Range   Prothrombin Time 15.7 (H) 11.4 - 15.2 seconds  INR 1.26     Comment: Performed at [facility], [address], [place], [place] [address]  Renal function panel     Status: Abnormal   Collection Time: 10/18/18  4:14 AM  Result Value Ref Range   Sodium 149 (H) 135 - 145 mmol/L   Potassium 3.2 (L) 3.5 - 5.1 mmol/L    Comment: DELTA CHECK NOTED   Chloride 119 (H) 98 - 111 mmol/L   CO2 23 22 - 32 mmol/L   Glucose, Bld 202 (H) 70 - 99 mg/dL   BUN 66 (H) 8 - 23 mg/dL   Creatinine, Ser 1.38 (H) 0.61 - 1.24 mg/dL   Calcium 8.1 (L) 8.9 - 10.3 mg/dL   Phosphorus 2.9 2.5 - 4.6 mg/dL   Albumin 2.6 (L) 3.5 - 5.0 g/dL   GFR calc non Af Amer 48 (L) >60 mL/min   GFR calc Af Amer 56 (L) >60 mL/min   Anion gap 7 5 - 15    Comment: Performed at [facility], [address] [place], [place]  [address]  CBC     Status: Abnormal   Collection Time: 10/18/18  4:14 AM  Result Value Ref Range   WBC 10.4 4.0 - 10.5 K/uL   RBC 3.30 (L) 4.22 - 5.81 MIL/uL   Hemoglobin 9.6 (L) 13.0 - 17.0 g/dL   HCT 32.1 (L) 39.0 - 52.0 %   MCV 97.3 80.0 - 100.0 fL   MCH 29.1 26.0 - 34.0 pg   MCHC 29.9 (L) 30.0 - 36.0 g/dL   RDW 14.2 11.5 - 15.5 %   Platelets 83 (L) 150 - 400 K/uL    Comment: SPECIMEN CHECKED FOR CLOTS Immature Platelet Fraction may be clinically indicated, consider ordering this additional test [id number] CONSISTENT WITH PREVIOUS RESULT    nRBC 0.2 0.0 - 0.2 %    Comment: Performed at [facility], [address], [place], [place] [address]  Blood gas, arterial     Status: Abnormal   Collection Time: 10/18/18  4:20 AM  Result Value Ref Range   FIO2 35.00    Delivery systems VENTILATOR    Mode PRESSURE REGULATED VOLUME CONTROL    VT 540 mL   LHR 16 resp/min   Peep/cpap 5.0 cm H20   pH, Arterial 7.473 (H) 7.350 - 7.450   pCO2 arterial 35.8 32.0 - 48.0 mmHg   pO2, Arterial 93.2 83.0 - 108.0 mmHg   Bicarbonate 26.9 20.0 - 28.0 mmol/L   Acid-Base Excess 2.5 (H) 0.0 - 2.0 mmol/L   O2 Saturation 97.6 %   Patient temperature 37.8    Collection site RIGHT RADIAL    Drawn by [id number]    Sample type ARTERIAL    Allens test (pass/fail) PASS PASS    Comment: Performed at [facility], [address] [place], [place] [address]  Glucose, capillary     Status: Abnormal   Collection Time: 10/18/18  8:12 AM  Result Value Ref Range   Glucose-Capillary 172 (H) 70 - 99 mg/dL  Glucose, capillary     Status: Abnormal   Collection Time: 10/18/18 11:31 AM  Result Value Ref Range   Glucose-Capillary 153 (H) 70 - 99 mg/dL  Glucose, capillary     Status: Abnormal   Collection Time: 10/18/18  4:24 PM  Result Value Ref Range   Glucose-Capillary 192 (H) 70 - 99 mg/dL   Comment 1 Notify RN   Glucose, capillary     Status: Abnormal   Collection Time: 10/18/18  7:34 PM  Result Value Ref Range    Glucose-Capillary 178 (H) 70 - 99 mg/dL  Glucose, capillary     Status: Abnormal   Collection Time: 10/19/18  1:37 AM  Result Value Ref Range   Glucose-Capillary 210 (H) 70 - 99 mg/dL  Glucose, capillary     Status: Abnormal   Collection Time: 10/19/18  4:06 AM  Result Value Ref Range   Glucose-Capillary 222 (H) 70 - 99 mg/dL  Protime-INR     Status: Abnormal   Collection Time: 10/19/18  4:13 AM  Result Value Ref Range   Prothrombin Time 16.4 (H) 11.4 - 15.2 seconds   INR 1.33     Comment: Performed at [facility], [address] [place], [place] [address]  Renal function panel     Status: Abnormal   Collection Time: 10/19/18  4:13 AM  Result Value Ref Range   Sodium 142 135 - 145 mmol/L   Potassium 4.6 3.5 - 5.1 mmol/L    Comment: DELTA CHECK NOTED   Chloride 116 (H) 98 - 111 mmol/L   CO2 21 (L) 22 - 32 mmol/L   Glucose, Bld 250 (H) 70 - 99 mg/dL   BUN 71 (H) 8 - 23 mg/dL   Creatinine, Ser 1.59 (H) 0.61 - 1.24 mg/dL   Calcium 7.3 (L) 8.9 - 10.3 mg/dL   Phosphorus 2.4 (L) 2.5 - 4.6 mg/dL   Albumin 2.1 (L) 3.5 - 5.0 g/dL   GFR calc non Af Amer 41 (L) >60 mL/min   GFR calc Af Amer 47 (L) >60 mL/min   Anion gap 5 5 - 15    Comment: Performed at [facility], [address] [place], [place] [address]  Blood gas, arterial     Status: Abnormal   Collection Time: 10/19/18  4:15 AM  Result Value Ref Range   FIO2 50.00    Delivery systems VENTILATOR    Mode PRESSURE REGULATED VOLUME CONTROL    VT 540 mL   LHR 16 resp/min   Peep/cpap 5.0 cm H20   pH, Arterial 7.462 (H) 7.350 - 7.450   pCO2 arterial 33.2 32.0 - 48.0 mmHg   pO2, Arterial 151.0 (H) 83.0 - 108.0 mmHg   Bicarbonate 24.8 20.0 - 28.0 mmol/L   Acid-Base Excess 0.0 0.0 - 2.0 mmol/L   O2 Saturation 99.4 %   Collection site RIGHT RADIAL    Drawn by [id number]    Sample type PASS    Allens test (pass/fail) PASS PASS    Comment: Performed at [facility], [address], [place], [place] [address]  Glucose, capillary      Status: Abnormal   Collection Time: 10/19/18  7:26 AM  Result Value Ref Range   Glucose-Capillary 207 (H) 70 - 99 mg/dL    ABGS Recent Labs    10/19/18 0415  PHART 7.462*  PO2ART 151.0*  HCO3 24.8   CULTURES Recent Results (from the past 240 hour(s))  MRSA PCR Screening     Status: None   Collection Time: 10/12/18  5:36 AM  Result Value Ref Range Status   MRSA by PCR NEGATIVE NEGATIVE Final    Comment:        The GeneXpert MRSA Assay (FDA approved for NASAL specimens only), is one component of a comprehensive MRSA colonization surveillance program. It is not intended to diagnose MRSA infection nor to guide or monitor treatment for MRSA infections. Performed at [facility], [address], [place], [place] [address]   Culture,  blood (routine x 2)     Status: None (Preliminary result)   Collection Time: 10/17/18  1:43 AM  Result Value Ref Range Status   Specimen Description BLOOD LEFT HAND  Final   Special Requests   Final    BOTTLES DRAWN AEROBIC AND ANAEROBIC Blood Culture adequate volume   Culture   Final    NO GROWTH 1 DAY Performed at [facility], [address], [place], [place] [address]    Report Status PENDING  Incomplete  Culture, blood (routine x 2)     Status: None (Preliminary result)   Collection Time: 10/17/18  1:46 AM  Result Value Ref Range Status   Specimen Description BLOOD RIGHT HAND  Final   Special Requests   Final    BOTTLES DRAWN AEROBIC AND ANAEROBIC Blood Culture results may not be optimal due to an excessive volume of blood received in culture bottles   Culture   Final    NO GROWTH 1 DAY Performed at [facility], [address] [place], [place] [address]    Report Status PENDING  Incomplete  Culture, respiratory (non-expectorated)     Status: None (Preliminary result)   Collection Time: 10/17/18  3:32 AM  Result Value Ref Range Status   Specimen Description   Final    TRACHEAL ASPIRATE Performed at [facility], [address]  [place], [place] [address]    Special Requests   Final    NONE Performed at [facility], [address] [place], [place] [address]    Gram Stain   Final    MODERATE WBC PRESENT, PREDOMINANTLY PMN MODERATE GRAM NEGATIVE RODS Performed at [facility], [place] [address], [place], [place] [address]    Culture ABUNDANT PSEUDOMONAS AERUGINOSA  Final   Report Status PENDING  Incomplete   Studies/Results: No results found.  Medications:  Prior to Admission:  Medications Prior to Admission  Medication Sig Dispense Refill Last Dose  . carvedilol (COREG) 6.25 MG tablet TAKE (1) TABLET BY MOUTH TWICE DAILY WTIH A MEAL. (Patient taking differently: Take 6.25 mg by mouth 2 (two) times daily with a meal. TAKE (1) TABLET BY MOUTH TWICE DAILY WTIH A MEAL.) 60 tablet 6 09/13/2018 at Unknown time  . furosemide (LASIX) 20 MG tablet TAKE 20 MG EVERY OTHER DAY ALTERNATING WITH 30 MG EVERY OTHER DAY 135 tablet 1 10/09/2018 at Unknown time  . HYDROcodone-acetaminophen (NORCO) 5-325 MG per tablet Take 1-2 tablets by mouth every 4 (four) hours as needed. (Patient taking differently: Take 1 tablet by mouth every 6 (six) hours as needed for moderate pain. ) 40 tablet 0 10/01/2018 at Unknown time  . Ipratropium-Albuterol (COMBIVENT RESPIMAT) 20-100 MCG/ACT AERS respimat Inhale 1 puff into the lungs as needed. (Patient taking differently: Inhale 1 puff into the lungs 4 (four) times daily. ) 1 Inhaler 1 10/02/2018 at Unknown time  . loperamide (IMODIUM) 2 MG capsule Take 1 capsule (2 mg total) by mouth as needed for diarrhea or loose stools. (Patient taking differently: Take 4 mg by mouth daily as needed for diarrhea or loose stools. ) 20 capsule 2 10/10/2018 at Unknown time  . potassium chloride SA (K-DUR,KLOR-CON) 20 MEQ tablet Take 0.5 tablets (10 mEq total) by mouth daily. (Patient taking differently: Take 20 mEq by mouth 2 (two) times daily. ) 90 tablet 3 09/29/2018 at Unknown time  . SSD 1 % cream Apply 1  application topically 3 (three) times daily as needed. Applied to legs as needed  Taking  . albuterol (PROVENTIL) (2.5 MG/3ML) 0.083% nebulizer solution Inhale 3 mLs into the lungs every 4 (four) hours as needed for wheezing or shortness of breath.    Taking  . diltiazem (CARDIZEM CD) 120 MG 24 hr capsule TAKE (1) CAPSULE BY MOUTH TWICE DAILY. 180 capsule 0  at Unknown time  . diltiazem (CARDIZEM CD) 120 MG 24 hr capsule TAKE (1) CAPSULE BY MOUTH TWICE DAILY. 180 capsule 3   . vitamin B-12 (CYANOCOBALAMIN) 1000 MCG tablet Take 1 tablet (1,000 mcg total) by mouth daily. 60 tablet 2 Taking  . warfarin (COUMADIN) 5 MG tablet Take 0.5-1 tablets (2.5-5 mg total) by mouth See admin instructions. TAKE 1/2 TABLET (2.'5mg'$  total) BY MOUTH DAILY, EXCEPT TAKE 1 TABLET ('5mg'$  total) ON MONDAYS, WEDNESDAYS AND FRIDAYS. (Patient not taking: Reported on 10/02/2018) 75 tablet 3 Not Taking at Unknown time   Scheduled: . chlorhexidine gluconate (MEDLINE KIT)  15 mL Mouth Rinse BID  . feeding supplement (PRO-STAT SUGAR FREE 64)  30 mL Oral Daily  . feeding supplement (VITAL AF 1.2 CAL)  1,000 mL Per Tube Q24H  . Influenza vac split quadrivalent PF  0.5 mL Intramuscular Tomorrow-1000  . insulin aspart  0-9 Units Subcutaneous Q4H  . insulin glargine  12 Units Subcutaneous QHS  . ipratropium  0.5 mg Nebulization Q6H  . ketoconazole   Topical BID  . levalbuterol  0.63 mg Nebulization Q6H  . mouth rinse  15 mL Mouth Rinse 10 times per day  . methylPREDNISolone (SOLU-MEDROL) injection  40 mg Intravenous Q8H  . metoprolol tartrate  2.5 mg Intravenous Q3H  . midodrine  10 mg Oral TID WC  . pantoprazole (PROTONIX) IV  40 mg Intravenous Q24H  . sodium chloride flush  3 mL Intravenous Q12H  . Warfarin - Pharmacist Dosing Inpatient   Does not apply Q24H   Continuous: . sodium chloride    . amiodarone 30 mg/hr (10/19/18 0551)  . ampicillin-sulbactam (UNASYN) IV Stopped (10/19/18 0453)  . dextrose 5 % 1,000 mL with  potassium chloride 40 mEq infusion 125 mL/hr at 10/19/18 0500  . propofol (DIPRIVAN) infusion 7.5 mcg/kg/min (10/19/18 0500)   PYP:PJKDTO chloride, acetaminophen, fentaNYL (SUBLIMAZE) injection, fentaNYL (SUBLIMAZE) injection, HYDROcodone-acetaminophen, levalbuterol, LORazepam, metoprolol tartrate, sodium chloride flush  Assesment: He was admitted with atrial fib with RVR.  He aspirated and developed multifocal pneumonia and acute hypoxic respiratory failure he has remained on the ventilator.  He has multifocal pneumonia.  Repeat chest x-ray in the morning  He had an episode of hematochezia and he was supratherapeutic with his INR and his platelet count was low and he was given fresh frozen and platelet transfusion and has not had any more significant bleeding.  Platelet count was 83,000 yesterday.  He continues to have trouble with rapid ventricular response despite being on amiodarone and receiving IV Lopressor.  He received digoxin last night his dosing of Lopressor was adjusted and his heart rates about 100 now.  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  He has been treated with Lasix and Lasix is now being held  He has chronic kidney disease and had acute kidney injury kidney function had been improving but is a little bit worse this morning Principal Problem:   Atrial fibrillation with RVR (HCC) Active Problems:   Long term current use of anticoagulant therapy   Aortic stenosis   CKD (chronic kidney disease) stage 3, GFR 30-59 ml/min (  [place])   Goals of care, counseling/discussion   Acute combined systolic and diastolic congestive heart failure ([place])   Hematochezia   Supratherapeutic INR   Palliative care by specialist   Multifocal pneumonia   Acute on chronic respiratory failure ([place])    Plan: Continue support.  Plan is for continued support awaiting further family to arrive and then  plan is for compassionate extubation    LOS: 8 days   Cory Ryan 10/19/2018, 7:43 AM

## 2018-10-19 NOTE — Progress Notes (Signed)
Patient's daughter, Altha Harm, called with concerns to patient's current status. This RN explained that ativan had been given due to agitation but that the day had been uneventful otherwise. Patient's daughter asked to speak with Ihor Dow (palliative care NP) but unfortunately she has left for the day, she then expressed that her main goal for him was to keep him comfortable until Friday when family could be at bedside. MD made made aware and daughter's phone number is in the chart should the MD need to contact her or should there be a change in status.  Celestia Khat, RN

## 2018-10-19 NOTE — Progress Notes (Signed)
Daily Progress Note   Patient Name: Cory Ryan       Date: 10/19/2018 DOB: 11/27/1940  Age: 78 y.o. MRN#: [id number] Attending Physician: Kathie Dike, MD Primary Care Physician: [name], MD Admit Date: 09/28/2018  Reason for Consultation/Follow-up: Establishing goals of care  Subjective: Patient remains sedated and intubated on 50% FIO2 vent. No s/s of pain or distress. He does open eyes to sternal rub.   GOC:  No family at bedside but did f/u with son and daughter via telephone.   Altha Harm is trying to get paperwork in order and likely will not be at the hospital today unless urgent.  Explained events from last night and further discussed no escalation of care with central line/pressors if he further declined. Daughter agrees with this decision. She is planning for compassionate extubation either Thursday or Friday "no later than Friday." She has reached out to family and waiting for visitors before making a decision of when to remove ventilator. One brother is attempting to come from [place]. Alyx agrees with decision for compassionate extubation to comfort but wishes for Altha Harm to arrange time for extubation.  Answered questions and concerns. Family has PMT contact information.   Length of Stay: 8  Current Medications: Scheduled Meds:  . chlorhexidine gluconate (MEDLINE KIT)  15 mL Mouth Rinse BID  . feeding supplement (PRO-STAT SUGAR FREE 64)  30 mL Oral Daily  . feeding supplement (VITAL AF 1.2 CAL)  1,000 mL Per Tube Q24H  . Influenza vac split quadrivalent PF  0.5 mL Intramuscular Tomorrow-1000  . insulin aspart  0-9 Units Subcutaneous Q4H  . insulin glargine  12 Units Subcutaneous QHS  . ipratropium  0.5 mg Nebulization Q6H  . ketoconazole   Topical BID  .  levalbuterol  0.63 mg Nebulization Q6H  . mouth rinse  15 mL Mouth Rinse 10 times per day  . methylPREDNISolone (SOLU-MEDROL) injection  40 mg Intravenous Q8H  . metoprolol tartrate  2.5 mg Intravenous Q3H  . midodrine  10 mg Oral TID WC  . pantoprazole (PROTONIX) IV  40 mg Intravenous Q24H  . sodium chloride flush  3 mL Intravenous Q12H  . Warfarin - Pharmacist Dosing Inpatient   Does not apply Q24H    Continuous Infusions: . sodium chloride    . amiodarone 30 mg/hr (10/19/18 1000)  .  ampicillin-sulbactam (UNASYN) IV Stopped (10/19/18 0453)  . dextrose 5 % 1,000 mL with potassium chloride 40 mEq infusion 125 mL/hr at 10/19/18 1109  . propofol (DIPRIVAN) infusion 10 mcg/kg/min (10/19/18 1000)    PRN Meds: sodium chloride, acetaminophen, fentaNYL (SUBLIMAZE) injection, fentaNYL (SUBLIMAZE) injection, HYDROcodone-acetaminophen, levalbuterol, LORazepam, metoprolol tartrate, sodium chloride flush  Physical Exam Vitals signs and nursing note reviewed.  Constitutional:      Interventions: He is sedated and intubated.  HENT:     Head: Normocephalic and atraumatic.  Cardiovascular:     Rate and Rhythm: Rhythm irregularly irregular.  Pulmonary:     Effort: No tachypnea, accessory muscle usage or respiratory distress. He is intubated.     Comments: FIO2 50% Abdominal:     Tenderness: There is no abdominal tenderness.  Skin:    General: Skin is warm and dry.  Neurological:     Comments: Opens eyes to sternal rub  Psychiatric:        Attention and Perception: He is inattentive.        Speech: He is noncommunicative.            Vital Signs: BP (!) 114/54   Pulse (!) 49   Temp 98.1 F (36.7 C) (Axillary)   Resp (!) 22   Ht _0  (1.778 m)   Wt 70.6 kg   SpO2 100%   BMI 22.33 kg/m  SpO2: SpO2: 100 % O2 Device: O2 Device: Ventilator O2 Flow Rate: O2 Flow Rate (L/min): 4 L/min  Intake/output summary:   Intake/Output Summary (Last 24 hours) at 10/19/2018 1128 Last data filed  at 10/19/2018 1000 Gross per 24 hour  Intake 4942.45 ml  Output 500 ml  Net 4442.45 ml   LBM: Last BM Date: 10/16/18 Baseline Weight: Weight: 72.6 kg Most recent weight: Weight: 70.6 kg       Palliative Assessment/Data: PPS 20%   Flowsheet Rows     Most Recent Value  Intake Tab  Referral Department  Hospitalist  Unit at Time of Referral  ICU  Palliative Care Primary Diagnosis  Cardiac  Date Notified  10/16/18  Palliative Care Type  New Palliative care  Reason for referral  Clarify Goals of Care  Date of Admission  09/16/2018  Date first seen by Palliative Care  10/18/18  # of days IP prior to Palliative referral  5  Clinical Assessment  Palliative Performance Scale Score  20%  Psychosocial & Spiritual Assessment  Palliative Care Outcomes  Patient/Family meeting held?  Yes  Who was at the meeting?  daughter and son  Palliative Care Outcomes  Clarified goals of care, Provided end of life care assistance, Provided psychosocial or spiritual support, ACP counseling assistance      Patient Active Problem List   Diagnosis Date Noted  . Palliative care by specialist   . Multifocal pneumonia   . Acute on chronic respiratory failure ([place])   . Hematochezia 10/16/2018  . Supratherapeutic INR 10/16/2018  . Atrial fibrillation with RVR ([place]) 10/06/2018  . CHF exacerbation ([place]) 11/06/2017  . Cellulitis of left thigh 11/06/2017  . Leukopenia 02/11/2017  . 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 Atrial fibrillation with rapid ventricular response ([place])   . Thrombocytopenia ([place]) 08/10/2015  . Chronic atrial fibrillation 08/08/2015  . Bronchospasm 10/05/2014  . Acute combined systolic and diastolic congestive heart failure ([place]) 10/03/2014  . Goals of care, counseling/discussion 12/27/2013  . History of colon cancer   . Colon cancer ([place]) 11/21/2013  . Coronary atherosclerosis of  native coronary artery 06/15/2013  . Aortic stenosis 06/15/2013  . Mitral regurgitation 06/15/2013  .  CKD (chronic kidney disease) stage 3, GFR 30-59 ml/min (HCC) 06/15/2013  . Long term current use of anticoagulant therapy 12/20/2012  . Systolic CHF, chronic ([place]) 02/06/2012    Palliative Care Assessment & Plan   Patient Profile: 78 y.o. male  with past medical history of chronic systolic heart failure, chronic kidney disease, COPD, afib on coumadin, aortic stenosis admitted on 10/06/2018 from PCP office with afib RVR. Patient had suspected aspiration event on 10/13/18 and developed acute respiratory failure requiring intubation/mechanical ventilation. Hospitalization complicated by acute rectal bleed s/p FFP and vitamin K. Not a candidate for GI procedures or bronchoscopy. Antibiotics initiated for multifocal pneumonia. Pulmonology following. Patient remains critically ill on ventilator. Palliative medicine consultation for goals of care.    Assessment: Acute on chronic respiratory failure with hypoxia Atrial fibrillation with RVR Decompensated CHF Volume overload Elevated troponin Multifocal pneumonia Chronic aortic stenosis CKD stage 3  Recommendations/Plan:  Although patient does not have a documented living will, adult children feel strongly that patient would NOT wish for prolonged heroic interventions including resuscitation, trach/vent, or feeding tube.   Continue current plan of care. NO escalation of care with central/pressors. Daughter gathering family and friends. She is planning compassionate extubation for Thursday or Friday ("no later than Friday").   PMT will continue to follow and assist with transition to comfort when family is ready.   Code Status: DNR   Code Status Orders  (From admission, onward)         Start     Ordered   10/18/18 1553  Do not attempt resuscitation (DNR)  Continuous    Question Answer Comment  In the event of cardiac or respiratory ARREST Do not call a "code blue"   In the event of cardiac or respiratory ARREST Do not perform Intubation,  CPR, defibrillation or ACLS   In the event of cardiac or respiratory ARREST Use medication by any route, position, wound care, and other measures to relive pain and suffering. May use oxygen, suction and manual treatment of airway obstruction as needed for comfort.      10/18/18 1552        Code Status History    Date Active Date Inactive Code Status Order ID Comments User Context   09/16/2018 1536 10/18/2018 1552 Full Code [id number]  Phillips Grout, MD ED   11/06/2017 1630 11/07/2017 1729 DNR [id number]  Kathie Dike, MD Inpatient   08/08/2015 2239 08/11/2015 2031 Full Code [id number]  Alphia Moh, MD Inpatient   10/02/2014 1714 10/05/2014 1740 Full Code [id number]  Doree Albee, MD ED   12/13/2013 1135 12/18/2013 1728 Full Code [id number]  [name], MD Inpatient   10/26/2012 1209 10/29/2012 2204 Full Code [id number]  Vanita Ingles, RN Inpatient   02/06/2012 1631 02/08/2012 1443 Full Code [id number]  Rexene Alberts, MD ED       Prognosis:   Poor prognosis  Discharge Planning:  To Be Determined  Care plan was discussed with Dr. Roderic Palau, RN, daughter/son, chaplain  Thank you for allowing the Palliative Medicine Team to assist in the care of this patient.   Time In: 1100 Time Out: 1130 Total Time 30 Prolonged Time Billed  no      Greater than 50%  of this time was spent counseling and coordinating care related to the above assessment and plan.  Ihor Dow, DNP, FNP-C Palliative Medicine Team  Phone: [phone]  Fax: [phone]  Please contact Palliative Medicine Team phone at [phone] for questions and concerns.

## 2018-10-20 DIAGNOSIS — F411 Generalized anxiety disorder: Secondary | ICD-10-CM

## 2018-10-20 DIAGNOSIS — J9620 Acute and chronic respiratory failure, unspecified whether with hypoxia or hypercapnia: Secondary | ICD-10-CM

## 2018-10-20 DIAGNOSIS — R0600 Dyspnea, unspecified: Secondary | ICD-10-CM

## 2018-10-20 LAB — BLOOD GAS, ARTERIAL
ACID-BASE DEFICIT: 1.3 mmol/L (ref 0.0–2.0)
Bicarbonate: 23.6 mmol/L (ref 20.0–28.0)
Drawn by: 21310
FIO2: 45
MECHVT: 540 mL
O2 Saturation: 98.8 %
Patient temperature: 37
RATE: 16 resp/min
pCO2 arterial: 31.7 mmHg — ABNORMAL LOW (ref 32.0–48.0)
pH, Arterial: 7.456 — ABNORMAL HIGH (ref 7.350–7.450)
pO2, Arterial: 113 mmHg — ABNORMAL HIGH (ref 83.0–108.0)

## 2018-10-20 LAB — GLUCOSE, CAPILLARY
Glucose-Capillary: 133 mg/dL — ABNORMAL HIGH (ref 70–99)
Glucose-Capillary: 167 mg/dL — ABNORMAL HIGH (ref 70–99)
Glucose-Capillary: 169 mg/dL — ABNORMAL HIGH (ref 70–99)
Glucose-Capillary: 171 mg/dL — ABNORMAL HIGH (ref 70–99)

## 2018-10-20 LAB — TRIGLYCERIDES: Triglycerides: 207 mg/dL — ABNORMAL HIGH (ref <150)

## 2018-10-20 LAB — PROTIME-INR
INR: 1.37
Prothrombin Time: 16.7 seconds — ABNORMAL HIGH (ref 11.4–15.2)

## 2018-10-20 MED ORDER — MIDAZOLAM HCL 2 MG/2ML IJ SOLN
1.0000 mg | INTRAMUSCULAR | Status: DC | PRN
  Administered 2018-10-20: 1 mg via INTRAVENOUS
  Filled 2018-10-20: qty 2

## 2018-10-20 MED ORDER — FENTANYL BOLUS VIA INFUSION
25.0000 ug | INTRAVENOUS | Status: DC | PRN
  Filled 2018-10-20: qty 25

## 2018-10-20 MED ORDER — MIDAZOLAM HCL 2 MG/2ML IJ SOLN: 1.0000 mg | INTRAMUSCULAR | Status: DC | PRN

## 2018-10-20 MED ORDER — VITAL AF 1.2 CAL PO LIQD
1000.0000 mL | ORAL | Status: DC
  Filled 2018-10-20 (×2): qty 1000

## 2018-10-20 MED ORDER — FENTANYL 2500MCG IN NS 250ML (10MCG/ML) PREMIX INFUSION
25.0000 ug/h | INTRAVENOUS | Status: DC
  Administered 2018-10-20: 50 ug/h via INTRAVENOUS
  Filled 2018-10-20: qty 250

## 2018-10-20 MED ORDER — GLYCOPYRROLATE 0.2 MG/ML IJ SOLN: 0.2000 mg | INTRAMUSCULAR | Status: DC | PRN

## 2018-10-20 MED ORDER — GLYCOPYRROLATE 0.2 MG/ML IJ SOLN: 0.3000 mg | Freq: Once | INTRAMUSCULAR | Status: DC

## 2018-10-20 MED ORDER — FENTANYL BOLUS VIA INFUSION
25.0000 ug | INTRAVENOUS | Status: DC | PRN
  Filled 2018-10-20: qty 100

## 2018-10-20 MED ORDER — FENTANYL CITRATE (PF) 100 MCG/2ML IJ SOLN
50.0000 ug | Freq: Once | INTRAMUSCULAR | Status: AC
  Administered 2018-10-20: 50 ug via INTRAVENOUS
  Filled 2018-10-20: qty 2

## 2018-10-20 MED ORDER — LORAZEPAM 2 MG/ML IJ SOLN
1.0000 mg | INTRAMUSCULAR | Status: DC | PRN
  Administered 2018-10-20: 1 mg via INTRAVENOUS
  Filled 2018-10-20: qty 1

## 2018-10-22 LAB — CULTURE, BLOOD (ROUTINE X 2)
CULTURE: NO GROWTH
Culture: NO GROWTH
Special Requests: ADEQUATE

## 2018-11-12 NOTE — Progress Notes (Addendum)
1345: F/u with daughter and son at bedside. Again discussed diagnoses, interventions, and poor prognosis with ongoing afib RVR (HR sustaining 130's this afternoon) and hypotension. Patient has appeared uncomfortable throughout the afternoon with facial grimacing and increased work of breathing. Recommended extubation this afternoon in order to allow comfort, peace, and dignity at EOL. Daughter and son agree with compassionate extubation and acknowledge that he appears to be suffering and uncomfortable.   Discussed process of compassionate extubation and shift to focus on comfort measures. Discussed medication regimen to ensure comfort and relief from suffering. Discussed EOL expectations and prepared them that he may pass quickly within hours or may surprise [place] and last a few days. Explained goal of comfort regardless of how long he survives after extubation. Daughter and son understand and are ready for extubation.   Prior to extubation, instructed RN to increase Fentanyl infusion to 152mcg/hr. Fentanyl 139mcg bolus via infusion and Versed 1mg  IV given. Patient comfortable and RT came to bedside to extubate. [place] 2L placed. Comfort screen initiated. Comfort cart for family at bedside. Chaplain notified.   Patient quickly went into vtach post-extubation. Agonal respirations. Appeared comfortable. Brought family back in from waiting room and prepared them that he will pass quickly. Patient passed while this NP at bedside.   Prayed with family and offered spiritual/emotional support. Information for [facility] hosp[facility]/bereavement support given to family. Therapeutic listening as family shares stories of Mr. Kratochvil and his love for fishing.    Additional 75 minutes spent with patient/family and coordination of care.    Ihor Dow, DNP, FNP-C Palliative Medicine Team  Phone: [phone] Fax: [phone]

## 2018-11-12 NOTE — Progress Notes (Signed)
Subjective: Cory Ryan remains intubated and on the ventilator.  Family requesting more comfort care until family can get here.  Objective: Vital signs in last 24 hours: Temp:  [98 F (36.7 C)-100.3 F (37.9 C)] 100.3 F (37.9 C) (01/09 0722) Pulse Rate:  [36-115] 115 (01/09 0722) Resp:  [18-35] 28 (01/09 0722) BP: (85-141)/(42-88) 101/56 (01/09 0715) SpO2:  [97 %-100 %] 99 % (01/09 0722) FiO2 (%):  [45 %-50 %] 45 % (01/09 0341) Weight change:  Last BM Date: 10/19/18  Intake/Output from previous day: 01/08 0701 - 01/09 0700 In: 3622.2 [I.V.:3322.2; NG/GT:200; IV Piggyback:100] Out: 1800 [Urine:1750; Emesis/NG output:50]  PHYSICAL EXAM General appearance: Intubated sedated on the ventilator Resp: rhonchi bilaterally Cardio: irregularly irregular rhythm GI: soft, non-tender; bowel sounds normal; no masses,  no organomegaly Extremities: Cory Ryan has significant third spacing of fluid and has chronic discoloration of his legs  Lab Results:  Results for orders placed or performed during the hospital encounter of 10/09/2018 (from the past 48 hour(s))  Glucose, capillary     Status: Abnormal   Collection Time: 10/18/18 11:31 AM  Result Value Ref Range   Glucose-Capillary 153 (H) 70 - 99 mg/dL  Glucose, capillary     Status: Abnormal   Collection Time: 10/18/18  4:24 PM  Result Value Ref Range   Glucose-Capillary 192 (H) 70 - 99 mg/dL   Comment 1 Notify RN   Glucose, capillary     Status: Abnormal   Collection Time: 10/18/18  7:34 PM  Result Value Ref Range   Glucose-Capillary 178 (H) 70 - 99 mg/dL  Glucose, capillary     Status: Abnormal   Collection Time: 10/19/18  1:37 AM  Result Value Ref Range   Glucose-Capillary 210 (H) 70 - 99 mg/dL  Glucose, capillary     Status: Abnormal   Collection Time: 10/19/18  4:06 AM  Result Value Ref Range   Glucose-Capillary 222 (H) 70 - 99 mg/dL  Protime-INR     Status: Abnormal   Collection Time: 10/19/18  4:13 AM  Result Value Ref Range   Prothrombin Time 16.4 (H) 11.4 - 15.2 seconds   INR 1.33     Comment: Performed at [facility], [address], [place], [place] [address]  Renal function panel     Status: Abnormal   Collection Time: 10/19/18  4:13 AM  Result Value Ref Range   Sodium 142 135 - 145 mmol/L   Potassium 4.6 3.5 - 5.1 mmol/L    Comment: DELTA CHECK NOTED   Chloride 116 (H) 98 - 111 mmol/L   CO2 21 (L) 22 - 32 mmol/L   Glucose, Bld 250 (H) 70 - 99 mg/dL   BUN 71 (H) 8 - 23 mg/dL   Creatinine, Ser 1.59 (H) 0.61 - 1.24 mg/dL   Calcium 7.3 (L) 8.9 - 10.3 mg/dL   Phosphorus 2.4 (L) 2.5 - 4.6 mg/dL   Albumin 2.1 (L) 3.5 - 5.0 g/dL   GFR calc non Af Amer 41 (L) >60 mL/min   GFR calc Af Amer 47 (L) >60 mL/min   Anion gap 5 5 - 15    Comment: Performed at [facility], [address], [place], [place] [address]  Blood gas, arterial     Status: Abnormal   Collection Time: 10/19/18  4:15 AM  Result Value Ref Range   FIO2 50.00    Delivery systems VENTILATOR    Mode PRESSURE REGULATED VOLUME CONTROL    VT 540 mL   LHR 16 resp/min  Peep/cpap 5.0 cm H20   pH, Arterial 7.462 (H) 7.350 - 7.450   pCO2 arterial 33.2 32.0 - 48.0 mmHg   pO2, Arterial 151.0 (H) 83.0 - 108.0 mmHg   Bicarbonate 24.8 20.0 - 28.0 mmol/L   Acid-Base Excess 0.0 0.0 - 2.0 mmol/L   O2 Saturation 99.4 %   Collection site RIGHT RADIAL    Drawn by [id number]    Sample type PASS    Allens test (pass/fail) PASS PASS    Comment: Performed at [facility], [address] [place], [place] [address]  Glucose, capillary     Status: Abnormal   Collection Time: 10/19/18  7:26 AM  Result Value Ref Range   Glucose-Capillary 207 (H) 70 - 99 mg/dL  Glucose, capillary     Status: Abnormal   Collection Time: 10/19/18 11:03 AM  Result Value Ref Range   Glucose-Capillary 207 (H) 70 - 99 mg/dL  Triglycerides     Status: None   Collection Time: 10/19/18  2:09 PM  Result Value Ref Range   Triglycerides 147 <150 mg/dL    Comment: Performed at [facility], [address] [place], [place] [address]  Glucose, capillary     Status: Abnormal   Collection Time: 10/19/18  4:00 PM  Result Value Ref Range   Glucose-Capillary 205 (H) 70 - 99 mg/dL  Glucose, capillary     Status: Abnormal   Collection Time: 10/19/18  7:56 PM  Result Value Ref Range   Glucose-Capillary 201 (H) 70 - 99 mg/dL  Glucose, capillary     Status: Abnormal   Collection Time: 18-Nov-2018 12:01 AM  Result Value Ref Range   Glucose-Capillary 171 (H) 70 - 99 mg/dL   Comment 1 Notify RN    Comment 2 Document in Chart   Glucose, capillary     Status: Abnormal   Collection Time: 2018/11/18  3:58 AM  Result Value Ref Range   Glucose-Capillary 167 (H) 70 - 99 mg/dL  Blood gas, arterial     Status: Abnormal   Collection Time: 11/18/18  4:10 AM  Result Value Ref Range   FIO2 45.00    Delivery systems VENTILATOR    Mode PRESSURE REGULATED VOLUME CONTROL    VT 540 mL   LHR 16.0 resp/min   pH, Arterial 7.456 (H) 7.350 - 7.450   pCO2 arterial 31.7 (L) 32.0 - 48.0 mmHg   pO2, Arterial 113.0 (H) 83.0 - 108.0 mmHg   Bicarbonate 23.6 20.0 - 28.0 mmol/L   Acid-base deficit 1.3 0.0 - 2.0 mmol/L   O2 Saturation 98.8 %   Patient temperature 37.0    Collection site LEFT RADIAL    Drawn by [id number]    Sample type ARTERIAL    Allens test (pass/fail) PASS PASS    Comment: Performed at [facility], [address], [place], [place] [address]  Glucose, capillary     Status: Abnormal   Collection Time: 11-18-18  7:21 AM  Result Value Ref Range   Glucose-Capillary 169 (H) 70 - 99 mg/dL    ABGS Recent Labs    18-Nov-2018 0410  PHART 7.456*  PO2ART 113.0*  HCO3 23.6   CULTURES Recent Results (from the past 240 hour(s))  MRSA PCR Screening     Status: None   Collection Time: 10/12/18  5:36 AM  Result Value Ref Range Status   MRSA by PCR NEGATIVE NEGATIVE Final    Comment:        The GeneXpert MRSA Assay (FDA approved for  NASAL specimens only), is one component of a comprehensive MRSA  colonization surveillance program. It is not intended to diagnose MRSA infection nor to guide or monitor treatment for MRSA infections. Performed at [facility], [address], [place], [place] [address]   Culture, blood (routine x 2)     Status: None (Preliminary result)   Collection Time: 10/17/18  1:43 AM  Result Value Ref Range Status   Specimen Description BLOOD LEFT HAND  Final   Special Requests   Final    BOTTLES DRAWN AEROBIC AND ANAEROBIC Blood Culture adequate volume   Culture   Final    NO GROWTH 3 DAYS Performed at [facility], [address] [place], [place] [address]    Report Status PENDING  Incomplete  Culture, blood (routine x 2)     Status: None (Preliminary result)   Collection Time: 10/17/18  1:46 AM  Result Value Ref Range Status   Specimen Description BLOOD RIGHT HAND  Final   Special Requests   Final    BOTTLES DRAWN AEROBIC AND ANAEROBIC Blood Culture results may not be optimal due to an excessive volume of blood received in culture bottles   Culture   Final    NO GROWTH 3 DAYS Performed at [facility], [address] [place], [place] [address]    Report Status PENDING  Incomplete  Culture, respiratory (non-expectorated)     Status: None   Collection Time: 10/17/18  3:32 AM  Result Value Ref Range Status   Specimen Description   Final    TRACHEAL ASPIRATE Performed at [facility], [address], [place], [place] [address]    Special Requests   Final    NONE Performed at [facility], [address], [place], [place] [address]    Gram Stain   Final    MODERATE WBC PRESENT, PREDOMINANTLY PMN MODERATE GRAM NEGATIVE RODS Performed at [facility], [place] [address] [place], [place] [address]    Culture ABUNDANT PSEUDOMONAS AERUGINOSA  Final   Report Status 10/19/2018 FINAL  Final   Organism ID, Bacteria PSEUDOMONAS AERUGINOSA  Final      Susceptibility   Pseudomonas aeruginosa - MIC*    CEFTAZIDIME 4 SENSITIVE Sensitive     CIPROFLOXACIN  <=0.25 SENSITIVE Sensitive     GENTAMICIN <=1 SENSITIVE Sensitive     IMIPENEM 1 SENSITIVE Sensitive     PIP/TAZO 8 SENSITIVE Sensitive     CEFEPIME <=1 SENSITIVE Sensitive     * ABUNDANT PSEUDOMONAS AERUGINOSA   Studies/Results: No results found.  Medications:  Prior to Admission:  Medications Prior to Admission  Medication Sig Dispense Refill Last Dose  . carvedilol (COREG) 6.25 MG tablet TAKE (1) TABLET BY MOUTH TWICE DAILY WTIH A MEAL. (Patient taking differently: Take 6.25 mg by mouth 2 (two) times daily with a meal. TAKE (1) TABLET BY MOUTH TWICE DAILY WTIH A MEAL.) 60 tablet 6 09/16/2018 at Unknown time  . furosemide (LASIX) 20 MG tablet TAKE 20 MG EVERY OTHER DAY ALTERNATING WITH 30 MG EVERY OTHER DAY 135 tablet 1 09/20/2018 at Unknown time  . HYDROcodone-acetaminophen (NORCO) 5-325 MG per tablet Take 1-2 tablets by mouth every 4 (four) hours as needed. (Patient taking differently: Take 1 tablet by mouth every 6 (six) hours as needed for moderate pain. ) 40 tablet 0 10/05/2018 at Unknown time  . Ipratropium-Albuterol (COMBIVENT RESPIMAT) 20-100 MCG/ACT AERS respimat Inhale 1 puff into the lungs as needed. (Patient taking differently: Inhale 1 puff into the lungs  4 (four) times daily. ) 1 Inhaler 1 09/20/2018 at Unknown time  . loperamide (IMODIUM) 2 MG capsule Take 1 capsule (2 mg total) by mouth as needed for diarrhea or loose stools. (Patient taking differently: Take 4 mg by mouth daily as needed for diarrhea or loose stools. ) 20 capsule 2 10/02/2018 at Unknown time  . potassium chloride SA (K-DUR,KLOR-CON) 20 MEQ tablet Take 0.5 tablets (10 mEq total) by mouth daily. (Patient taking differently: Take 20 mEq by mouth 2 (two) times daily. ) 90 tablet 3 09/17/2018 at Unknown time  . SSD 1 % cream Apply 1 application topically 3 (three) times daily as needed. Applied to legs as needed   Taking  . albuterol (PROVENTIL) (2.5 MG/3ML) 0.083% nebulizer solution Inhale 3 mLs into the lungs  every 4 (four) hours as needed for wheezing or shortness of breath.    Taking  . diltiazem (CARDIZEM CD) 120 MG 24 hr capsule TAKE (1) CAPSULE BY MOUTH TWICE DAILY. 180 capsule 0  at Unknown time  . diltiazem (CARDIZEM CD) 120 MG 24 hr capsule TAKE (1) CAPSULE BY MOUTH TWICE DAILY. 180 capsule 3   . vitamin B-12 (CYANOCOBALAMIN) 1000 MCG tablet Take 1 tablet (1,000 mcg total) by mouth daily. 60 tablet 2 Taking  . warfarin (COUMADIN) 5 MG tablet Take 0.5-1 tablets (2.5-5 mg total) by mouth See admin instructions. TAKE 1/2 TABLET (2.'5mg'$  total) BY MOUTH DAILY, EXCEPT TAKE 1 TABLET ('5mg'$  total) ON MONDAYS, WEDNESDAYS AND FRIDAYS. (Patient not taking: Reported on 09/24/2018) 75 tablet 3 Not Taking at Unknown time   Scheduled: . chlorhexidine gluconate (MEDLINE KIT)  15 mL Mouth Rinse BID  . feeding supplement (PRO-STAT SUGAR FREE 64)  30 mL Oral Daily  . feeding supplement (VITAL AF 1.2 CAL)  1,000 mL Per Tube Q24H  . Influenza vac split quadrivalent PF  0.5 mL Intramuscular Tomorrow-1000  . insulin aspart  0-9 Units Subcutaneous Q4H  . insulin glargine  12 Units Subcutaneous QHS  . ipratropium  0.5 mg Nebulization Q6H  . ketoconazole   Topical BID  . levalbuterol  0.63 mg Nebulization Q6H  . mouth rinse  15 mL Mouth Rinse 10 times per day  . methylPREDNISolone (SOLU-MEDROL) injection  40 mg Intravenous Q8H  . metoprolol tartrate  2.5 mg Intravenous Q3H  . midodrine  10 mg Oral TID WC  . pantoprazole (PROTONIX) IV  40 mg Intravenous Q24H  . sodium chloride flush  3 mL Intravenous Q12H  . Warfarin - Pharmacist Dosing Inpatient   Does not apply Q24H   Continuous: . sodium chloride    . dextrose 5 % 1,000 mL with potassium chloride 40 mEq infusion 125 mL/hr at 10-31-2018 0500  . propofol (DIPRIVAN) infusion 20 mcg/kg/min (10/31/18 0602)   JXB:JYNWGN chloride, acetaminophen, fentaNYL (SUBLIMAZE) injection, fentaNYL (SUBLIMAZE) injection, HYDROcodone-acetaminophen, levalbuterol, LORazepam, metoprolol  tartrate, sodium chloride flush  Assesment: Cory Ryan has acute on chronic respiratory failure on the basis of aspiration with multifocal pneumonia and some element of COPD.  Cory Ryan is also had acute combined systolic and diastolic heart failure.  Cory Ryan now has DNR status and family is requesting more comfort care with plans for compassionate extubation tomorrow. Principal Problem:   Atrial fibrillation with RVR (HCC) Active Problems:   Long term current use of anticoagulant therapy   Aortic stenosis   CKD (chronic kidney disease) stage 3, GFR 30-59 ml/min (HCC)   Terminal care   Acute combined systolic and diastolic congestive heart failure (HCC)   Hematochezia  Supratherapeutic INR   Palliative care by specialist   Multifocal pneumonia   Acute on chronic respiratory failure ([place])    Plan: Discontinue blood gases.  Continue supportive care.  I will follow more peripherally.    LOS: 9 days   Alonza Bogus 2018/11/19, 8:13 AM

## 2018-11-12 NOTE — Progress Notes (Addendum)
Daily Progress Note   Patient Name: Cory Ryan       Date: Oct 27, 2018 DOB: 1940/11/18  Age: 78 y.o. MRN#: [id number] Attending Physician: Kathie Dike, MD Primary Care Physician: [name], MD Admit Date: 09/25/2018  Reason for Consultation/Follow-up: Establishing goals of care  Subjective: Patient does not open eyes to sternal rub this morning. Grimacing, tachycardia, and tachypnea. RN to give prn fentanyl.   GOC:  No family at bedside. Spoke with son and daughter via telephone.   Altha Harm has notified family and majority of family has visited. Waiting on family from [place].   Discussed plan for compassionate extubation and shift to comfort measures in order to allow peace and dignity at EOL. Educated on medications to ensure comfort and relief from suffering. Extubation planned for 10am tomorrow, 10/21/18. Altha Harm asks that we stop "sticking" him. "Whatever it takes to make him comfortable." I did explain the need for [place] to be cautious with how much pain medication we give him due to soft blood pressures and their hope to get him to friday. Reassured that I would check on him throughout the day.   Answered questions and concerns. Emotional support provided. Encouraged Richardson Landry and Altha Harm to call PMT phone or have RN page me this afternoon if they are at the hospital and wish to further discuss plan for tomorrow.    ADDENDUM ~11am: Called daughter, Altha Harm and updated her on current status. Fentanyl gtt has been initiated. He remains tachycardic, hypotensive, and with facial grimacing. Strongly encouraged daughter to gather son and other family members that are available and consider extubation today, with fear that her father will continue to decline/struggle if we continue to  wait to extubate until tomorrow AM. She tells me if they can be here, it won't be until this afternoon. Daughter has PMT contact information and I also gave her my pager number. Encouraged Altha Harm to talk with family and call me back.   Length of Stay: 9  Current Medications: Scheduled Meds:  . chlorhexidine gluconate (MEDLINE KIT)  15 mL Mouth Rinse BID  . feeding supplement (VITAL AF 1.2 CAL)  1,000 mL Per Tube Q24H  . Influenza vac split quadrivalent PF  0.5 mL Intramuscular Tomorrow-1000  . insulin aspart  0-9 Units Subcutaneous Q4H  . insulin glargine  12 Units Subcutaneous QHS  .  ipratropium  0.5 mg Nebulization Q6H  . ketoconazole   Topical BID  . levalbuterol  0.63 mg Nebulization Q6H  . mouth rinse  15 mL Mouth Rinse 10 times per day  . methylPREDNISolone (SOLU-MEDROL) injection  40 mg Intravenous Q8H  . metoprolol tartrate  2.5 mg Intravenous Q3H  . midodrine  10 mg Oral TID WC  . pantoprazole (PROTONIX) IV  40 mg Intravenous Q24H  . sodium chloride flush  3 mL Intravenous Q12H  . Warfarin - Pharmacist Dosing Inpatient   Does not apply Q24H    Continuous Infusions: . sodium chloride    . dextrose 5 % 1,000 mL with potassium chloride 40 mEq infusion 125 mL/hr at 2018-10-22 0821  . propofol (DIPRIVAN) infusion 20 mcg/kg/min (October 22, 2018 0821)    PRN Meds: sodium chloride, acetaminophen, fentaNYL (SUBLIMAZE) injection, fentaNYL (SUBLIMAZE) injection, levalbuterol, LORazepam, metoprolol tartrate, sodium chloride flush  Physical Exam Vitals signs and nursing note reviewed.  Constitutional:      Appearance: He is cachectic. He is ill-appearing.     Interventions: He is sedated and intubated.  HENT:     Head: Normocephalic and atraumatic.  Cardiovascular:     Rate and Rhythm: Rhythm irregularly irregular.  Pulmonary:     Effort: Tachypnea present. No accessory muscle usage or respiratory distress. He is intubated.     Comments: RN to give prn fentanyl Abdominal:      Tenderness: There is no abdominal tenderness.  Skin:    General: Skin is warm and dry.     Comments: anasarca  Neurological:     Comments: Grimacing/uncomfortable  Psychiatric:        Attention and Perception: He is inattentive.        Speech: He is noncommunicative.            Vital Signs: BP (!) 108/58   Pulse (!) 117   Temp 100.3 F (37.9 C) (Axillary)   Resp (!) 34   Ht '5\' 10"'$  (1.778 m)   Wt 70.6 kg   SpO2 96%   BMI 22.33 kg/m  SpO2: SpO2: 96 % O2 Device: O2 Device: Ventilator O2 Flow Rate: O2 Flow Rate (L/min): 4 L/min  Intake/output summary:   Intake/Output Summary (Last 24 hours) at 10/22/2018 7893 Last data filed at Oct 22, 2018 8101 Gross per 24 hour  Intake 3636.94 ml  Output 1800 ml  Net 1836.94 ml   LBM: Last BM Date: 10/19/17 Baseline Weight: Weight: 72.6 kg Most recent weight: Weight: 70.6 kg       Palliative Assessment/Data: PPS 20%   Flowsheet Rows     Most Recent Value  Intake Tab  Referral Department  Hospitalist  Unit at Time of Referral  ICU  Palliative Care Primary Diagnosis  Cardiac  Date Notified  10/16/18  Palliative Care Type  New Palliative care  Reason for referral  Clarify Goals of Care  Date of Admission  09/30/2018  Date first seen by Palliative Care  10/18/18  # of days IP prior to Palliative referral  5  Clinical Assessment  Palliative Performance Scale Score  20%  Psychosocial & Spiritual Assessment  Palliative Care Outcomes  Patient/Family meeting held?  Yes  Who was at the meeting?  daughter and son  Palliative Care Outcomes  Clarified goals of care, Provided end of life care assistance, Provided psychosocial or spiritual support, ACP counseling assistance      Patient Active Problem List   Diagnosis Date Noted  . Palliative care by specialist   .  Multifocal pneumonia   . Acute on chronic respiratory failure ([place])   . Hematochezia 10/16/2018  . Supratherapeutic INR 10/16/2018  . Atrial fibrillation with RVR ([place])  10/07/2018  . CHF exacerbation ([place]) 11/06/2017  . Cellulitis of left thigh 11/06/2017  . Leukopenia 02/11/2017  . 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 Atrial fibrillation with rapid ventricular response ([place])   . Thrombocytopenia ([place]) 08/10/2015  . Chronic atrial fibrillation 08/08/2015  . Bronchospasm 10/05/2014  . Acute combined systolic and diastolic congestive heart failure ([place]) 10/03/2014  . Terminal care 12/27/2013  . History of colon cancer   . Colon cancer ([place]) 11/21/2013  . Coronary atherosclerosis of native coronary artery 06/15/2013  . Aortic stenosis 06/15/2013  . Mitral regurgitation 06/15/2013  . CKD (chronic kidney disease) stage 3, GFR 30-59 ml/min ([facility]) 06/15/2013  . Long term current use of anticoagulant therapy 12/20/2012  . Systolic CHF, chronic ([place]) 02/06/2012    Palliative Care Assessment & Plan   Patient Profile: 78 y.o. male  with past medical history of chronic systolic heart failure, chronic kidney disease, COPD, afib on coumadin, aortic stenosis admitted on 09/17/2018 from PCP office with afib RVR. Patient had suspected aspiration event on 10/13/18 and developed acute respiratory failure requiring intubation/mechanical ventilation. Hospitalization complicated by acute rectal bleed s/p FFP and vitamin K. Not a candidate for GI procedures or bronchoscopy. Antibiotics initiated for multifocal pneumonia. Pulmonology following. Patient remains critically ill on ventilator. Palliative medicine consultation for goals of care.    Assessment: Acute on chronic respiratory failure with hypoxia Atrial fibrillation with RVR Decompensated CHF Volume overload Elevated troponin Multifocal pneumonia Chronic aortic stenosis  CKD stage 3  Recommendations/Plan:  Although patient does not have a documented living will, adult children feel strongly that patient would NOT wish for prolonged heroic interventions including resuscitation, trach/vent, or feeding tube.   NO  escalation of care with central/pressors. Daughter gathering family and friends. Compassionate extubation scheduled for 10/21/18 at 10am.   Continue prn ativan and fentanyl to maintain comfort.   Daughter requests we stop sticks. Will discontinue lab draws and CBG's. Decreased IVF and tube feeds with plan to shift to full comfort tomorrow morning.    Code Status: DNR   Code Status Orders  (From admission, onward)         Start     Ordered   10/18/18 1553  Do not attempt resuscitation (DNR)  Continuous    Question Answer Comment  In the event of cardiac or respiratory ARREST Do not call a "code blue"   In the event of cardiac or respiratory ARREST Do not perform Intubation, CPR, defibrillation or ACLS   In the event of cardiac or respiratory ARREST Use medication by any route, position, wound care, and other measures to relive pain and suffering. May use oxygen, suction and manual treatment of airway obstruction as needed for comfort.      10/18/18 1552        Code Status History    Date Active Date Inactive Code Status Order ID Comments User Context   09/12/2018 1536 10/18/2018 1552 Full Code [id number]  Phillips Grout, MD ED   11/06/2017 1630 11/07/2017 1729 DNR [id number]  Kathie Dike, MD Inpatient   08/08/2015 2239 08/11/2015 2031 Full Code [id number]  Alphia Moh, MD Inpatient   10/02/2014 1714 10/05/2014 1740 Full Code [id number]  Doree Albee, MD ED   12/13/2013 1135 12/18/2013 1728 Full Code [id number]  [name], MD Inpatient   10/26/2012  1209 10/29/2012 2204 Full Code [id number]  Vanita Ingles, RN Inpatient   02/06/2012 1631 02/08/2012 1443 Full Code [id number]  Rexene Alberts, MD ED       Prognosis:   Poor prognosis  Discharge Planning:  To Be Determined  Care plan was discussed with Dr. Roderic Palau, RN, daughter/son  Thank you for allowing the Palliative Medicine Team to assist in the care of this patient.   Time In: 0900 Time Out: 0935 Total Time 35 Prolonged  Time Billed  no      Greater than 50%  of this time was spent counseling and coordinating care related to the above assessment and plan.  Ihor Dow, DNP, FNP-C Palliative Medicine Team  Phone: [phone] Fax: [phone]  Please contact Palliative Medicine Team phone at [phone] for questions and concerns.

## 2018-11-12 NOTE — Progress Notes (Signed)
[facility] notified of pt's death. Referral number [id number]. Potential for tissue donation only. Bedside RN Caryl Pina notified.

## 2018-11-12 NOTE — Discharge Summary (Signed)
Death Summary  Cory Ryan OQH:[id number] DOB: Oct 16, 1940 DOA: Nov 05, 2018  PCP: [name], MD  Admit date: Nov 05, 2018 Date of Death: November 14, 2018 Time of Death: 14:58  History of present illness:  Cory Ryan is a 78 y.o. male with a history of chronic systolic congestive heart failure, chronic kidney disease stage III, atrial fibrillation on anticoagulation, COPD, was sent to the emergency room from his primary care physician's office with rapid atrial fibrillation.  He was started on Cardizem infusion.  He was also noted to be in decompensated CHF and was started on intravenous Lasix.  Unfortunately, hospitalization was complicated by an aspiration event which led to acute on chronic respiratory failure culminating in intubation.  Patient remained on the ventilator for several days.  Unfortunately, his condition did not improve.  He continued to have rapid atrial fibrillation with low blood pressure and difficulty managing his rate.  Chest x-ray showed multifocal pneumonia and he was treated with Unasyn.  He was diuresed with intravenous Lasix.  Despite aggressive measures, his overall condition failed to improve.  Palliative care met with patient's family and after several conversations, they elected to make patient a DNR and consider compassionate extubation.  After watch patient another few days, it was noted that he was not improving, appears uncomfortable.  On 14-Nov-2022, family decided to compassionately extubate the patient.  Shortly after extubation, patient passed away.  Family was at the bedside and were provided support.  Final Diagnoses:  Principal Problem:   Atrial fibrillation with RVR (HCC) Active Problems:   Long term current use of anticoagulant therapy   Aortic stenosis   CKD (chronic kidney disease) stage 3, GFR 30-59 ml/min (HCC)   Terminal care   Acute combined systolic and diastolic congestive heart failure (HCC)   Hematochezia   Supratherapeutic INR   Palliative care by  specialist   Multifocal pneumonia   Acute on chronic respiratory failure (HCC)   Anxiety state   Dyspnea     The results of significant diagnostics from this hospitalization (including imaging, microbiology, ancillary and laboratory) are listed below for reference.    Significant Diagnostic Studies: Dg Chest 2 View  Result Date: Nov 05, 2018 CLINICAL DATA:  Chest congestion, cough for 2 weeks, diarrhea, former smoking history EXAM: CHEST - 2 VIEW COMPARISON:  Chest x-ray of 08/07/2017 FINDINGS: There is cardiomegaly present with moderate pulmonary vascular congestion and small right effusion. These findings are most compatible with mild CHF. On the lateral view there is somewhat more prominent opacity overlying the right middle lobe or lingula most likely related to pulmonary vascular congestion, but follow-up chest x-ray is recommended to exclude developing pneumonia. Mediastinal and hilar contours are unremarkable and median sternotomy sutures appear normal. No acute bony abnormality is noted. IMPRESSION: 1. Moderate pulmonary vascular congestion with cardiomegaly and small right effusion most consistent with CHF. 2. Opacity on the lateral view within the right middle lobe or lingula probably due to pulmonary vascular congestion but developing pneumonia can not be excluded. Consider follow-up chest x-ray. Electronically Signed   By: Ivar Drape M.D.   On: 2018/11/05 12:43   Ct Chest Wo Contrast  Result Date: 2018/11/05 CLINICAL DATA:  Shortness of breath, rales on physical exam, opacity on the recent chest x-ray in the right middle lobe or lingula. Follow-up EXAM: CT CHEST WITHOUT CONTRAST TECHNIQUE: Multidetector CT imaging of the chest was performed following the standard protocol without IV contrast. COMPARISON:  Chest x-ray of Nov 05, 2017 FINDINGS: Cardiovascular: Moderate thoracic aortic atherosclerosis is present.  The pulmonary arteries also are very prominent most likely indicating a degree  of pulmonary arterial hypertension. Diffuse coronary artery calcifications are noted and median sternotomy sutures are present. No pericardial effusion is seen. Mediastinum/Nodes: There are prominent mediastinal lymph nodes. A pretracheal node on image 60 series 2 measures 16 mm in diameter. And additional pretracheal node on image 61 measures 12 mm in diameter. A prevascular node on image 58 measures 15 mm. A subcarinal node on image 85 measures 15 mm in diameter. These nodes may well be postinflammatory or infectious, with neoplasm remaining a possibility. The thyroid gland is unremarkable. No hiatal hernia is seen. Lungs/Pleura: On lung window images, there is a moderately large right pleural effusion present extending into the fissure. However, there is also patchy airspace disease within the right upper lobe and right middle lobe most consistent with multifocal pneumonia. The left lung appears clear. There may be a degree of interstitial edema present. The central airway appears patent. Upper Abdomen: Within the upper abdomen, no significant abnormality is seen. There may be debris layering within the gallbladder. Significant splenic artery calcifications are present. Musculoskeletal: The thoracic vertebrae are in normal alignment with diffuse degenerative change present. IMPRESSION: 1. Parenchymal opacity within the right upper lobe and right middle lobe consistent with multifocal pneumonia. 2. Moderate size right pleural effusion some of which tracks into the fissures. 3. Moderate thoracic aortic atherosclerosis Electronically Signed   By: Ivar Drape M.D.   On: 10/01/2018 16:56   Ct Abdomen Pelvis W Contrast  Result Date: 09/30/2018 CLINICAL DATA:  Diarrhea EXAM: CT ABDOMEN AND PELVIS WITH CONTRAST TECHNIQUE: Multidetector CT imaging of the abdomen and pelvis was performed using the standard protocol following bolus administration of intravenous contrast. CONTRAST:  42m OMNIPAQUE IOHEXOL 300 MG/ML   SOLN COMPARISON:  November 24, 2013 FINDINGS: Note that there is a degree of motion artifact. Lower chest: There is a sizable free-flowing right pleural effusion, incompletely visualized. There is consolidation in the lateral segment right middle lobe, incompletely visualized. There is calcification in the mitral valve region. There are foci of coronary artery calcification. Hepatobiliary: The liver has a subtly nodular contour. There is decreased attenuation in the liver felt to represent hepatic steatosis. No focal liver lesions are appreciable. Gallbladder wall is not appreciably thickened. There is no biliary duct dilatation. Pancreas: No evident pancreatic mass or inflammatory focus. Spleen: Spleen measures 15.2 x 10.9 x 5.8 cm with a measured splenic volume of 480 cubic cm. No focal splenic lesions are appreciable. Adrenals/Urinary Tract: Right adrenal appears normal. There is mild left adrenal hypertrophy. There is a 1 x 1 cm cyst arising from the upper pole left kidney. There is a second 1 x 1 cm cyst in the mid left kidney. There is an 8 x 7 mm cyst in the posterior mid right kidney. There is no appreciable hydronephrosis on either side. There is no appreciable renal or ureteral calculus on either side. Urinary bladder is midline with wall thickness within normal limits. Stomach/Bowel: Patient has had a subtotal colectomy. Anastomosis of bowel in the mid pelvic region appears patent. There is no appreciable bowel wall or mesenteric thickening. No evident bowel obstruction. There is no free air or portal venous air. Vascular/Lymphatic: There is extensive aortoiliac atherosclerosis. There is aneurysmal dilatation of the aorta inferior to the renal arteries. The maximum transverse diameter of the aorta measures 4.8 x 4.8 cm. There is moderate thrombus in the periphery of the aorta. No dissection evident. Major mesenteric arterial  vessels appear patent although there is extensive aortic atherosclerosis. There  is no evident adenopathy in the abdomen or pelvis. Reproductive: There are several small prostatic calculi. Prostate and seminal vesicles appear normal in size and contour. No pelvic mass evident. Other: There is no abscess or ascites in the abdomen or pelvis. Musculoskeletal: There is extensive degenerative change in the lower thoracic and lumbar regions. No blastic or lytic bone lesions are evident. There is spinal stenosis at L2-3, L3-4, and L4-5 due to disc protrusion and bony hypertrophy. 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 There are no intramuscular or abdominal wall lesions. IMPRESSION: 1. Note that there is a degree of motion artifact making this study less than optimal. 2. Subtle nodularity of the liver contour, felt to represent a degree of underlying cirrhosis. Decreased attenuation throughout the liver is felt to represent superimposed hepatic steatosis. 3.  Prominent spleen without focal splenic lesion. 4. Status post subtotal colectomy with patent anastomosis. Remaining bowel shows no wall thickening or obstruction. No abscess evident. 5. Infrarenal abdominal aortic aneurysm with a maximum transverse diameter of 4.8 x 4.8 cm. Recommend followup by abdomen and pelvis CTA in 6 months, and vascular surgery referral/consultation if not already obtained. This recommendation follows ACR consensus guidelines: White Paper of the ACR Incidental Findings Committee II on Vascular Findings. J Am Coll Radiol 2013; 10:789-794. Extensive aortoiliac atherosclerosis. Foci of coronary artery calcification noted. 6.  Fairly sizable free-flowing right pleural effusion. 7. Incomplete visualization of apparent infiltrate in the lateral segment right middle lobe, likely pneumonia. Underlying mass in this area can not be entirely excluded. Chest CT after treatment for pneumonia is advisable to further and completely assess this area. 8. There is no  renal or ureteral calculus. No hydronephrosis. There are small prostatic calculi. 9. Spinal stenosis at L2-3, L3-4, and L4-5, multifactorial in etiology. 10. Mild left adrenal hypertrophy, a finding of questionable clinical significance. This is a stable finding compared to previous study. Aortic Atherosclerosis (ICD10-I70.0). Electronically Signed   By: Lowella Grip III M.D.   On: 09/16/2018 13:07   Dg Chest Port 1 View  Result Date: 10/17/2018 CLINICAL DATA:  Ventilator support EXAM: PORTABLE CHEST 1 VIEW COMPARISON:  10/16/2018 FINDINGS: Endotracheal tube tip is 4 cm above the carina. Nasogastric tube enters the stomach. Edema pattern persists, with left lower lobe collapse and a small left effusion. IMPRESSION: Persistent edema pattern with left lower lobe collapse and left effusion. Electronically Signed   By: Nelson Chimes M.D.   On: 10/17/2018 06:55   Dg Chest Port 1 View  Result Date: 10/16/2018 CLINICAL DATA:  Respiratory failure. EXAM: PORTABLE CHEST 1 VIEW COMPARISON:  10/15/2018 FINDINGS: The ET tube tip is above the carina. There is an enteric tube with tip below the level of the GE junction. Bilateral pleural effusions and moderate interstitial edema noted. Left lung base opacity is increased from previous exam. IMPRESSION: 1. Worsening aeration to the left lung base. 2. CHF, unchanged. Electronically Signed   By: Kerby Moors M.D.   On: 10/16/2018 07:46   Dg Chest Port 1 View  Result Date: 10/15/2018 CLINICAL DATA:  Initial evaluation for acute respiratory failure, fluid overload. EXAM: PORTABLE CHEST 1 VIEW COMPARISON:  Prior radiograph from 10/13/2018. FINDINGS: Endotracheal tube in place with tip position 5.7 cm above the carina. Enteric tube courses into the abdomen. Median sternotomy wires with underlying CABG markers noted. Stable cardiomegaly. Mediastinal silhouette normal. Lungs  mildly hypoinflated. Persistent interstitial vascular congestion with small bilateral pleural  effusions. Superimposed hazy bibasilar opacities favored to reflect edema and/or atelectasis, although infiltrates would be difficult to exclude. No pneumothorax. No acute osseous abnormality. IMPRESSION: 1. Support apparatus in satisfactory position. 2. Persistent mild diffuse pulmonary interstitial congestion/edema with small bilateral pleural effusions. 3. Superimposed hazy bibasilar opacities, favored to reflect atelectasis and/or edema. Superimposed infiltrates could be considered in the correct clinical setting. Electronically Signed   By: Jeannine Boga M.D.   On: 10/15/2018 03:56   Dg Chest Port 1 View  Result Date: 10/13/2018 CLINICAL DATA:  Status post intubation EXAM: PORTABLE CHEST 1 VIEW COMPARISON:  Portable chest x-ray of earlier today at 12:41 p.m. FINDINGS: The patient has undergone interval intubation of the trachea and esophagus. The endotracheal tube tip projects approximately 4.7 cm above the carina. The esophagogastric tubes proximal port lies approximately at the GE junction. Tip lies in the gastric cardia. The cardiac silhouette is top-normal in size. The pulmonary vascularity is engorged and the interstitial markings increased. There is an azygos lobe anatomy on the right. There is no pleural effusion. 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 There is calcification in the wall of the aortic arch. IMPRESSION: CHF with interstitial edema.  No definite pneumonia. Reasonable positioning of the endotracheal tube. Advancement of the orogastric tube by 5-10 cm is recommended to assure that the proximal port lies below the GE junction. Thoracic aortic atherosclerosis. Electronically Signed   By: David  Martinique M.D.   On: 10/13/2018 13:30   Dg Chest Port 1 View  Result Date: 10/13/2018 CLINICAL DATA:  Short of breath. EXAM: PORTABLE CHEST 1 VIEW COMPARISON:  10/13/2018 at 10:20 a.m. and older exams. FINDINGS: Since the earlier exam, bilateral hazy airspace opacities have improved. There is persistent  vascular congestion and probable small effusions. Changes from CABG surgery are stable. No mediastinal or hilar masses. No pneumothorax. IMPRESSION: Improved congestive heart failure. Electronically Signed   By: Lajean Manes M.D.   On: 10/13/2018 12:52   Dg Chest Port 1 View  Result Date: 10/13/2018 CLINICAL DATA:  Congestive heart failure.  Multifocal pneumonia. EXAM: PORTABLE CHEST 1 VIEW COMPARISON:  Radiographs 08/07/2018 and 10/10/2018.  CT 10/04/2018. FINDINGS: 1020 hours. Stable cardiomegaly post median sternotomy and CABG. There are worsening diffuse bilateral airspace opacities with poor definition of the pulmonary vasculature. Confluent components are noted in the right infrahilar region, corresponding with probable pneumonia in the right middle and upper lobes on CT. There are small bilateral pleural effusions. Azygos fissure noted. IMPRESSION: Worsening bilateral airspace opacities, probably edema superimposed on right-sided pneumonia as correlated with recent CT. Electronically Signed   By: Richardean Sale M.D.   On: 10/13/2018 10:35    Microbiology: Recent Results (from the past 240 hour(s))  MRSA PCR Screening     Status: None   Collection Time: 10/12/18  5:36 AM  Result Value Ref Range Status   MRSA by PCR NEGATIVE NEGATIVE Final    Comment:        The GeneXpert MRSA Assay (FDA approved for NASAL specimens only), is one component of a comprehensive MRSA colonization surveillance program. It is not intended to diagnose MRSA infection nor to guide or monitor treatment for MRSA infections. Performed at [facility], [address] [place], [place] [address]   Culture, blood (routine x 2)     Status: None (Preliminary result)   Collection Time: 10/17/18  1:43 AM  Result Value Ref Range Status   Specimen Description BLOOD  LEFT HAND  Final   Special Requests   Final    BOTTLES DRAWN AEROBIC AND ANAEROBIC Blood Culture adequate volume   Culture   Final    NO GROWTH 3  DAYS Performed at [facility], [address], [place], [place] [address]    Report Status PENDING  Incomplete  Culture, blood (routine x 2)     Status: None (Preliminary result)   Collection Time: 10/17/18  1:46 AM  Result Value Ref Range Status   Specimen Description BLOOD RIGHT HAND  Final   Special Requests   Final    BOTTLES DRAWN AEROBIC AND ANAEROBIC Blood Culture results may not be optimal due to an excessive volume of blood received in culture bottles   Culture   Final    NO GROWTH 3 DAYS Performed at [facility], [address] [place], [place] [address]    Report Status PENDING  Incomplete  Culture, respiratory (non-expectorated)     Status: None   Collection Time: 10/17/18  3:32 AM  Result Value Ref Range Status   Specimen Description   Final    TRACHEAL ASPIRATE Performed at [facility], [address] [place], [place] [address]    Special Requests   Final    NONE Performed at [facility], [address], [place], [place] [address]    Gram Stain   Final    MODERATE WBC PRESENT, PREDOMINANTLY PMN MODERATE GRAM NEGATIVE RODS Performed at [facility], [place] [address] [place],  [address]    Culture ABUNDANT PSEUDOMONAS AERUGINOSA  Final   Report Status 10/19/2018 FINAL  Final   Organism ID, Bacteria PSEUDOMONAS AERUGINOSA  Final      Susceptibility   Pseudomonas aeruginosa - MIC*    CEFTAZIDIME 4 SENSITIVE Sensitive     CIPROFLOXACIN <=0.25 SENSITIVE Sensitive     GENTAMICIN <=1 SENSITIVE Sensitive     IMIPENEM 1 SENSITIVE Sensitive     PIP/TAZO 8 SENSITIVE Sensitive     CEFEPIME <=1 SENSITIVE Sensitive     * ABUNDANT PSEUDOMONAS AERUGINOSA     Labs: Basic Metabolic Panel: Recent Labs  Lab 10/14/18 0436 10/14/18 2334  10/16/18 0733 10/16/18 1955 10/17/18 0147 10/18/18 0414 10/19/18 0413  NA 139  --    < > 141 145 143 149* 142  K 3.8 3.7   < > 3.4* 3.7 4.0 3.2* 4.6  CL 106  --    < > 108 113* 111 119* 116*  CO2 21*  --    < > _0 21*  GLUCOSE 171*  --    < > 225* 228* 198* 202* 250*  BUN 37*  --    < > 80* 82* 81* 66* 71*  CREATININE 1.90*  --    < > 2.30* 1.96* 1.77* 1.38* 1.59*  CALCIUM 8.4*  --    < > 7.7* 7.9* 8.1* 8.1* 7.3*  MG 2.3 2.6*  --   --  2.9*  --   --   --   PHOS  --   --   --  4.6  --  3.2 2.9 2.4*   < > = values in this interval not displayed.   Liver Function Tests: Recent Labs  Lab 10/14/18 0436 10/16/18 0025 10/16/18 0733 10/16/18 1956 10/17/18 0147 10/18/18 0414 10/19/18 0413  AST 14* 22  --  32  --   --   --   ALT 12 10  --  17  --   --   --  ALKPHOS 62 62  --  56  --   --   --   BILITOT 1.1 0.7  --  0.9  --   --   --   PROT 6.4* 6.3*  --  6.2*  --   --   --   ALBUMIN 3.0* 3.0* 3.2* 3.2* 3.1* 2.6* 2.1*   No results for input(s): LIPASE, AMYLASE in the last 168 hours. No results for input(s): AMMONIA in the last 168 hours. CBC: Recent Labs  Lab 10/16/18 0025 10/16/18 0733 10/16/18 1956 10/17/18 0147 10/18/18 0414  WBC 10.3 6.7 7.3 7.4 10.4  NEUTROABS 9.6*  --  6.6  --   --   HGB 10.3* 9.5* 9.4* 9.2* 9.6*  HCT 33.5* 30.8* 31.3* 30.1* 32.1*  MCV 96.0 96.6 97.5 96.8 97.3  PLT 110* 90* 93* 90* 83*   Cardiac Enzymes: Recent Labs  Lab 10/16/18 1955 10/17/18 0146 10/17/18 0906  TROPONINI 0.08* 0.09* 0.05*   D-Dimer No results for input(s): DDIMER in the last 72 hours. BNP: Invalid input(s): POCBNP CBG: Recent Labs  Lab 10/19/18 1956 2018/10/31 0001 2018/10/31 0358 2018/10/31 0721 10/31/18 1130  GLUCAP 201* 171* 167* 169* 133*   Anemia work up No results for input(s): VITAMINB12, FOLATE, FERRITIN, TIBC, IRON, RETICCTPCT in the last 72 hours. Urinalysis    Component Value Date/Time   COLORURINE YELLOW 10/04/2018 [place] 09/20/2018 1445   LABSPEC 1.026 10/09/2018 1445   PHURINE 5.0 09/25/2018 1445   GLUCOSEU NEGATIVE 09/23/2018 1445   HGBUR MODERATE (A) 09/12/2018 1445   BILIRUBINUR NEGATIVE 10/08/2018 1445   KETONESUR NEGATIVE 09/19/2018  1445   PROTEINUR NEGATIVE 09/16/2018 1445   UROBILINOGEN 0.2 02/06/2012 1450   NITRITE NEGATIVE 10/10/2018 1445   LEUKOCYTESUR NEGATIVE 09/15/2018 1445   Sepsis Labs Invalid input(s): PROCALCITONIN,  WBC,  LACTICIDVEN     SIGNED:  Kathie Dike, MD  [facility] Oct 31, 2018, 7:30 PM Pager   If 7PM-7AM, please contact night-coverage [email or web address]

## 2018-11-12 NOTE — Procedures (Signed)
**Note De-Identified Lovelle Deitrick Obfuscation** Extubation Procedure Note  Patient Details:   Name: Cory Ryan DOB: 1941-04-11 MRN: [id number]   Airway Documentation:  Airway 8 mm (Active)  Secured at (cm) 24 cm November 01, 2018 12:04 PM  Measured From Lips 01-Nov-2018  8:20 AM  Secured Location Left 11/01/2018  8:20 AM  Secured By [employer] November 01, 2018 12:04 PM  Tube Holder Repositioned Yes 11/01/18  8:20 AM  Cuff Pressure (cm H2O) 24 cm H2O 10/19/2018  8:08 PM  Site Condition Dry 2018/11/01  8:20 AM   Vent end date: (not recorded) Vent end time: (not recorded)   Evaluation  O2 sats: stable throughout Complications: No apparent complications Patient did tolerate procedure well. Bilateral Breath Sounds: Expiratory wheezes, Rhonchi   No Extubation for comfort Thaila Bottoms, Penni Bombard 11/01/18, 2:58 PM

## 2018-11-12 DEATH — deceased

## 2018-11-23 ENCOUNTER — Ambulatory Visit: Payer: Medicare Other | Admitting: Gastroenterology

## 2018-12-12 ENCOUNTER — Ambulatory Visit: Payer: Medicare Other | Admitting: Cardiology

## 2019-02-21 ENCOUNTER — Other Ambulatory Visit (HOSPITAL_COMMUNITY): Payer: Medicare Other

## 2019-02-28 ENCOUNTER — Ambulatory Visit (HOSPITAL_COMMUNITY): Payer: Medicare Other | Admitting: Hematology

## 2020-02-14 IMAGING — DX DG CHEST 2V
2 series · 2 of 2 positions shown · non-contrast
Comparison: Chest x-ray of 08/07/2017

CLINICAL DATA: Chest congestion, cough for 2 weeks, diarrhea,
former smoking history

EXAM:
CHEST - 2 VIEW

[chest lat]
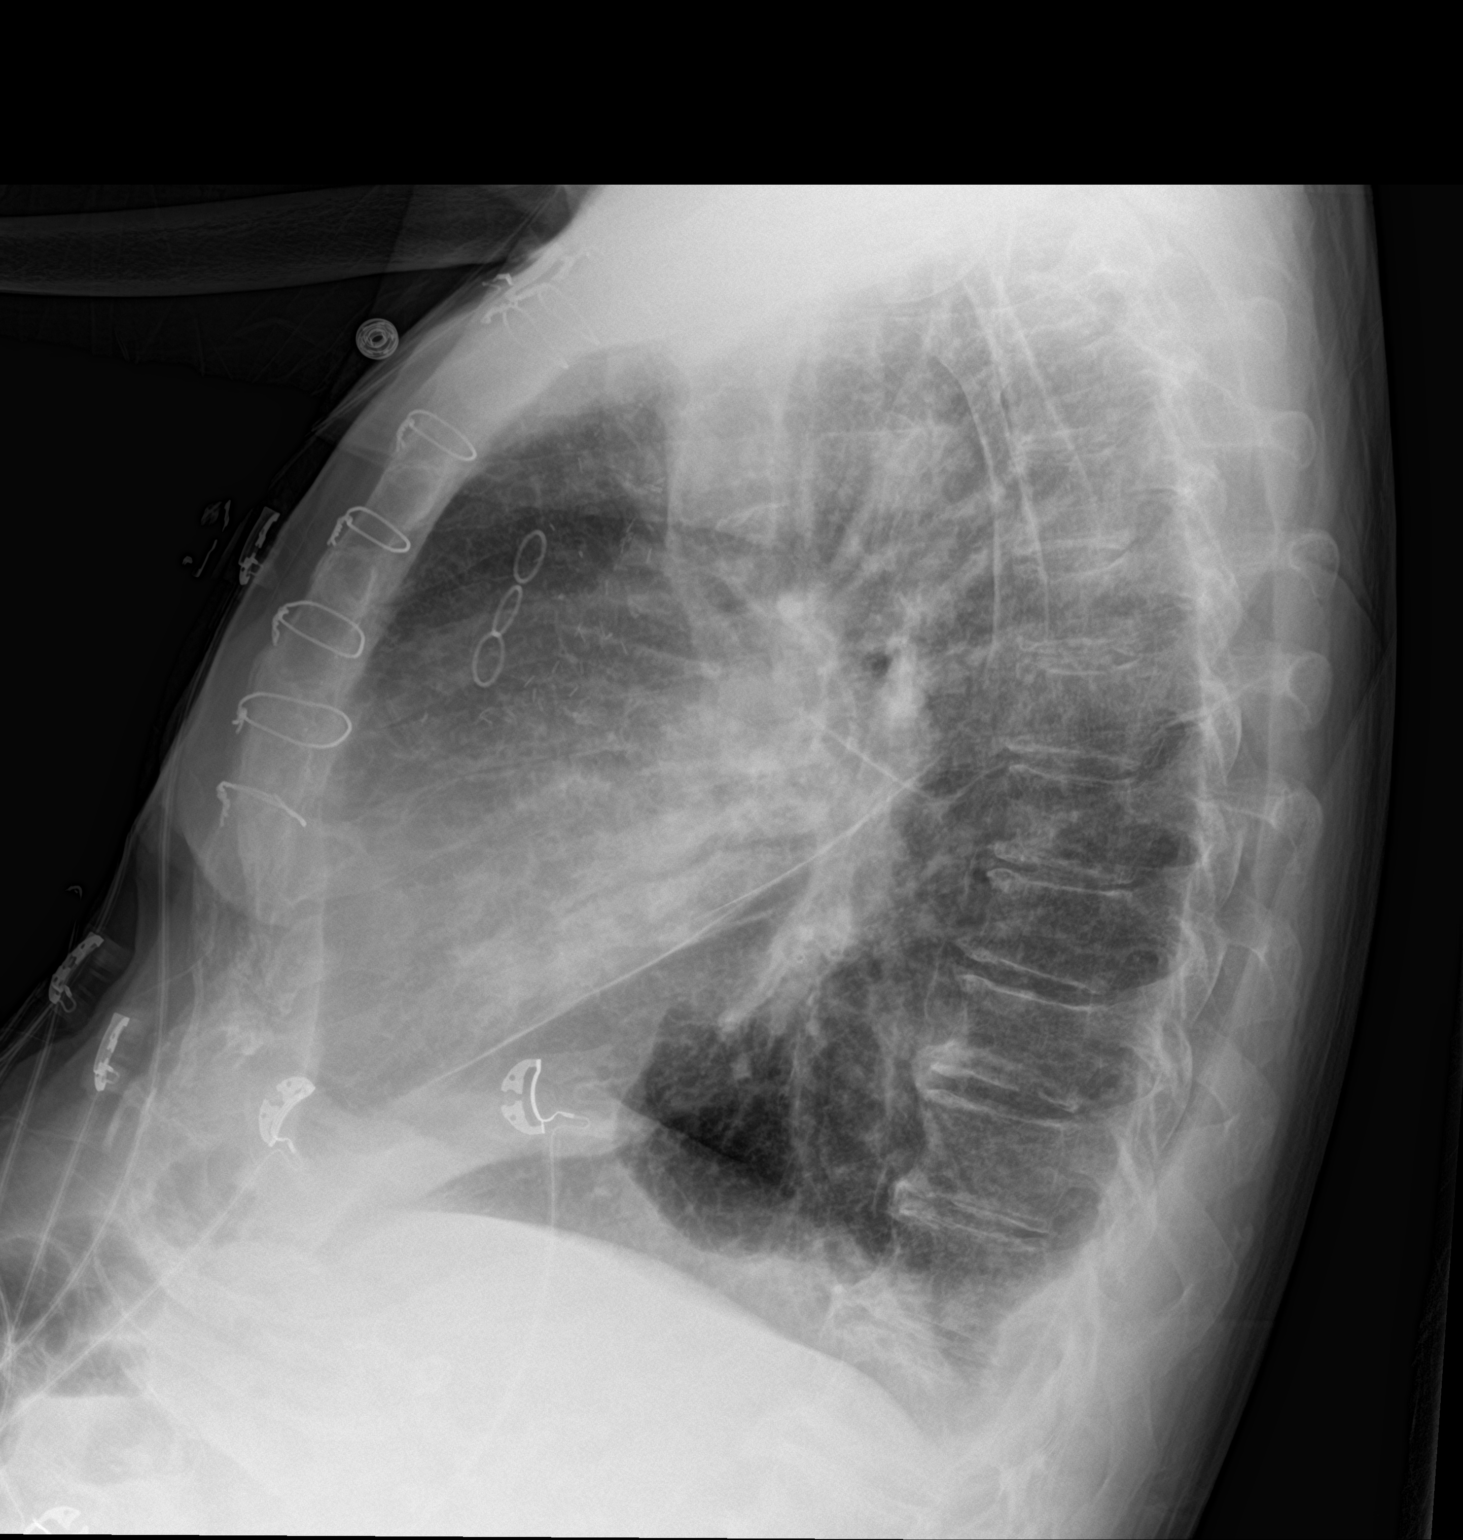

[chest ap]
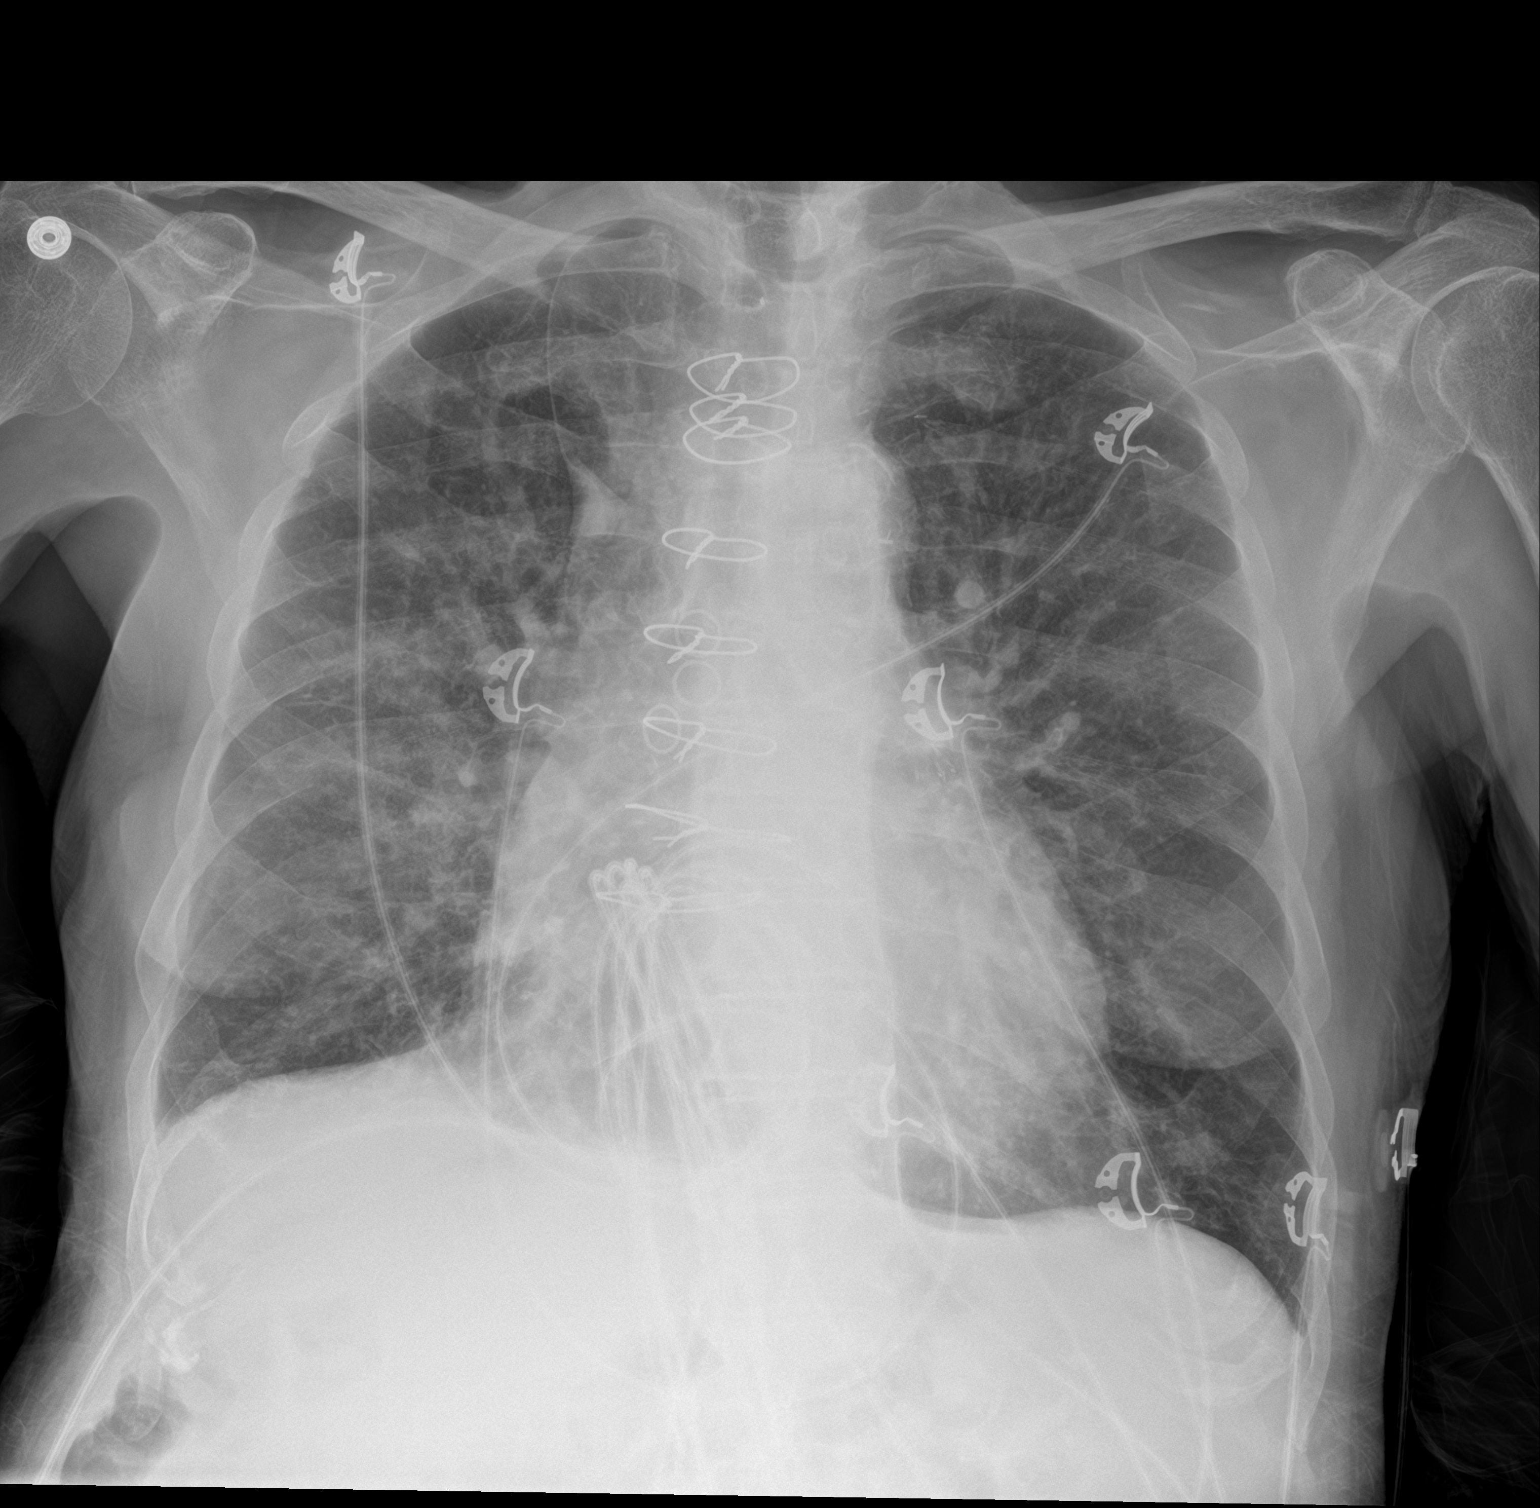

[2 of 2 positions shown; findings below may reference images not displayed]

FINDINGS: There is cardiomegaly present with moderate pulmonary vascular
congestion and small right effusion. These findings are most
compatible with mild CHF. On the lateral view there is somewhat more
prominent opacity overlying the right middle lobe or lingula most
likely related to pulmonary vascular congestion, but follow-up chest
x-ray is recommended to exclude developing pneumonia. Mediastinal
and hilar contours are unremarkable and median sternotomy sutures
appear normal. No acute bony abnormality is noted.
IMPRESSION: 1. Moderate pulmonary vascular congestion with cardiomegaly and
small right effusion most consistent with CHF.
2. Opacity on the lateral view within the right middle lobe or
lingula probably due to pulmonary vascular congestion but developing
pneumonia can not be excluded. Consider follow-up chest x-ray.

## 2020-02-14 IMAGING — CT CT CHEST W/O CM
2 of 3 series · 15 of 36 positions shown, 18 images · non-contrast
Comparison: Chest x-ray of 10/11/2017

CLINICAL DATA: Shortness of breath, rales on physical exam, opacity
on the recent chest x-ray in the right middle lobe or lingula.
Follow-up

EXAM:
CT CHEST WITHOUT CONTRAST
TECHNIQUE: Multidetector CT imaging of the chest was performed following the
standard protocol without IV contrast.

[Series 2: thorax · axial · 0.64mm/px · z∈[-443,-139]mm · 12 of 180 slices shown, 15 images]
[im 14/180  mediastinal]
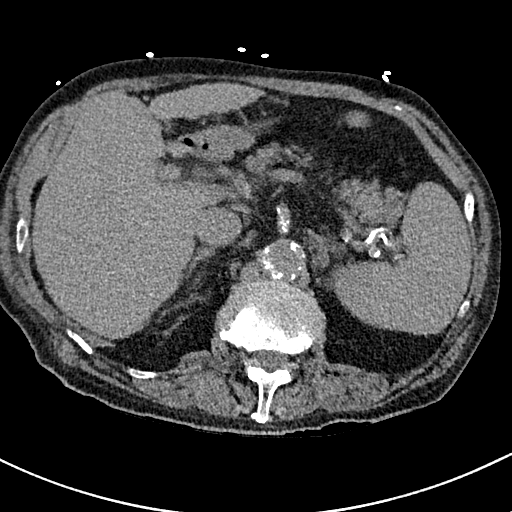
[im 14/180  lung]
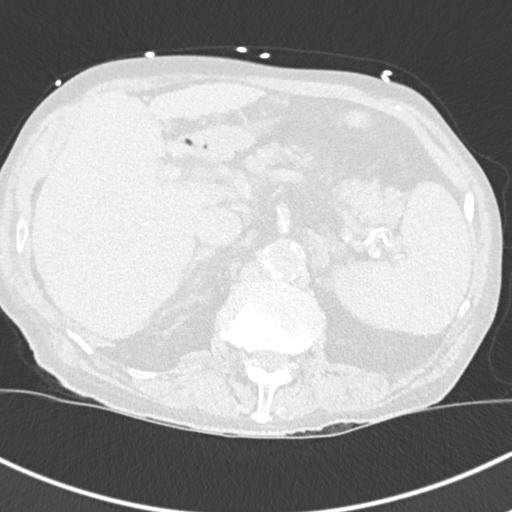
[im 27/180  lung]
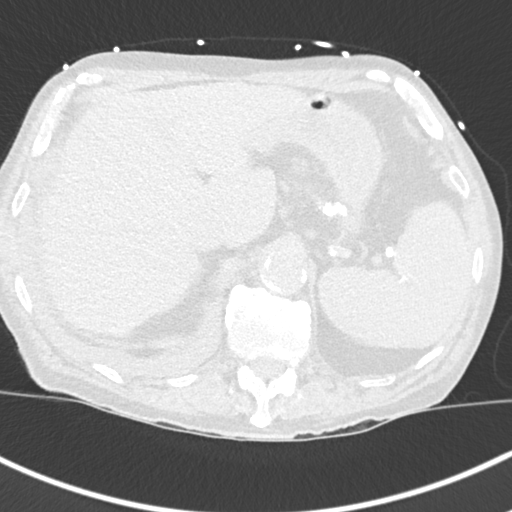
[im 40/180  lung]
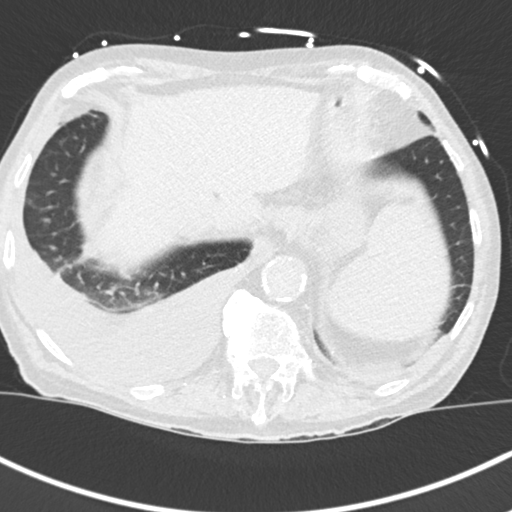
[im 54/180  lung]
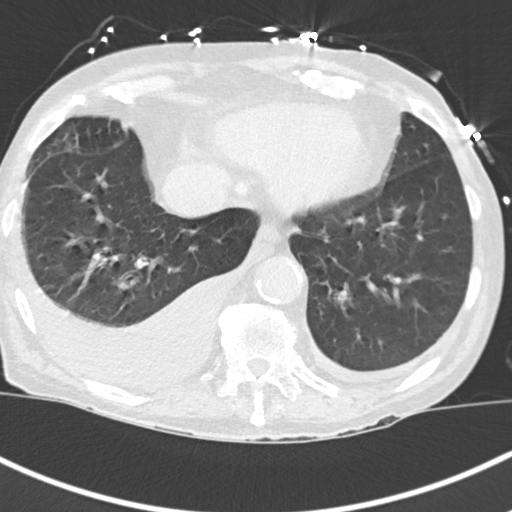
[im 67/180  mediastinal]
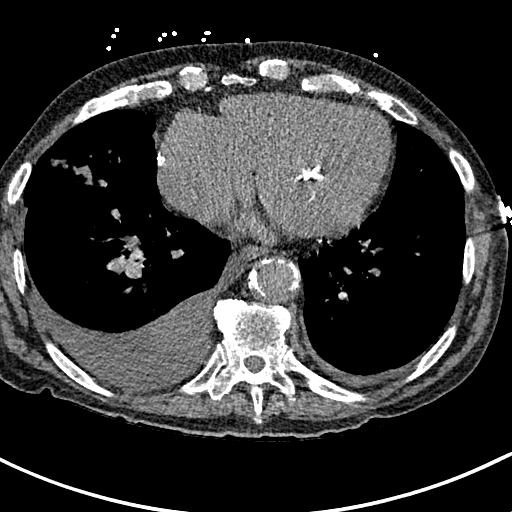
[im 67/180  lung]
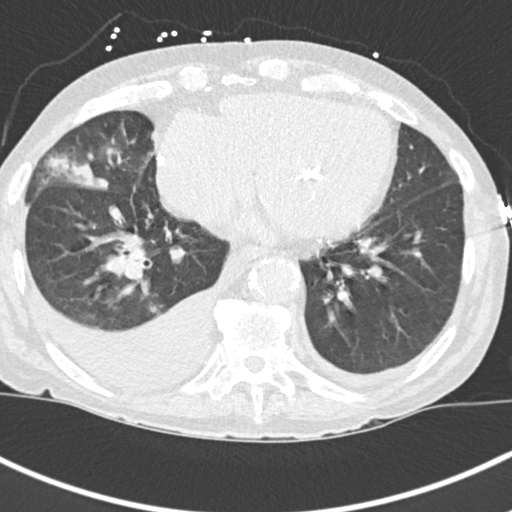
[im 80/180  lung]
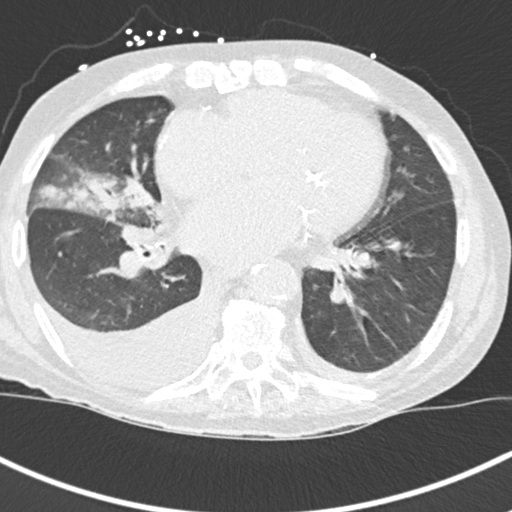
[im 100/180  lung]
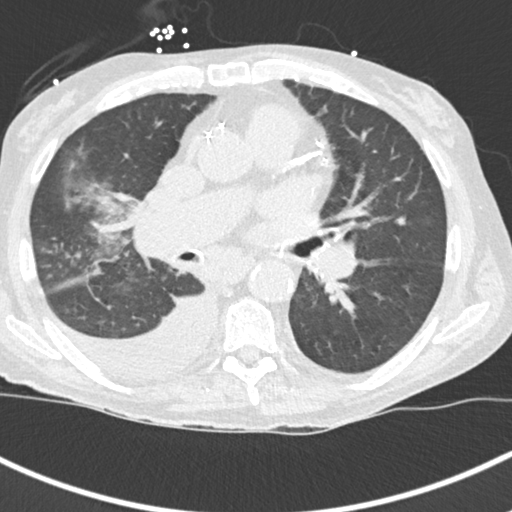
[im 113/180  lung]
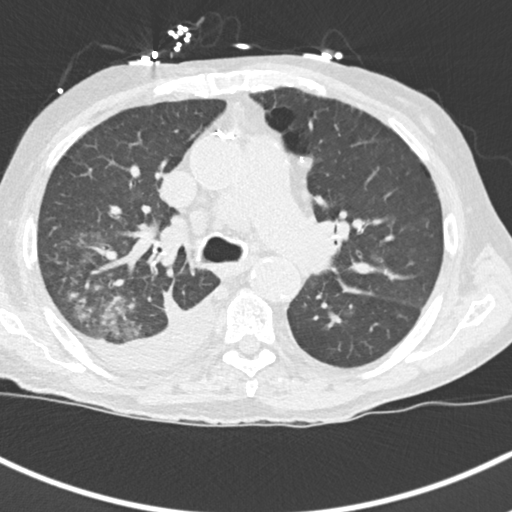
[im 126/180  mediastinal]
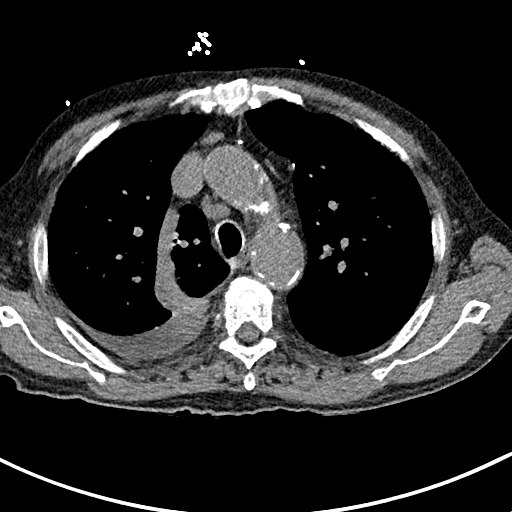
[im 126/180  lung]
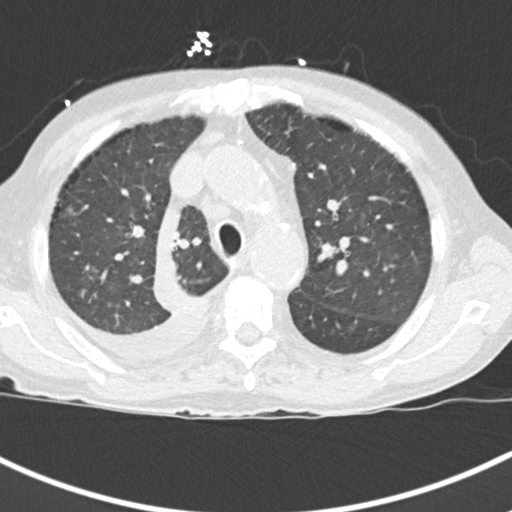
[im 140/180  lung]
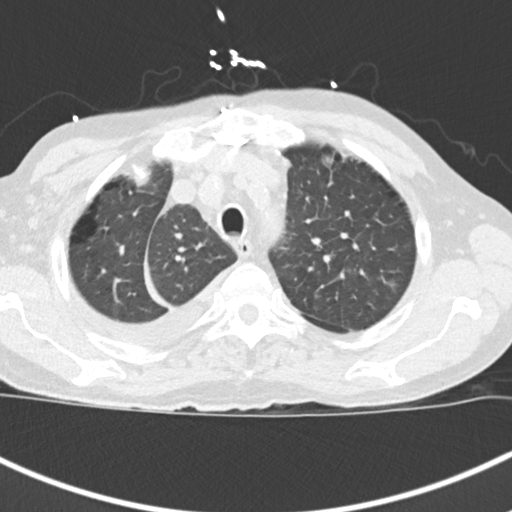
[im 153/180  lung]
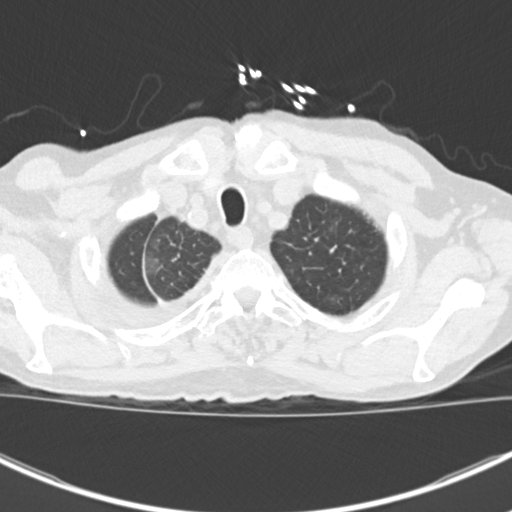
[im 166/180  lung]
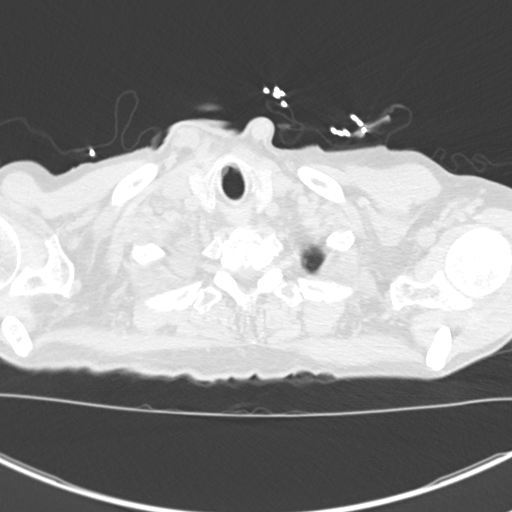

[Series 5: coronal · coronal · 0.70mm/px · 3 of 150 slices shown]
[im 30/150  lung]
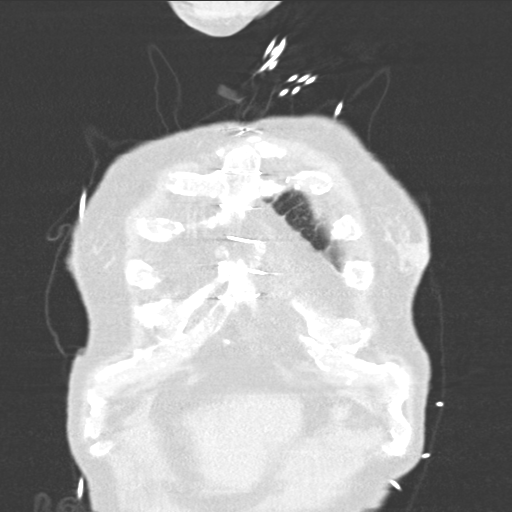
[im 60/150  lung]
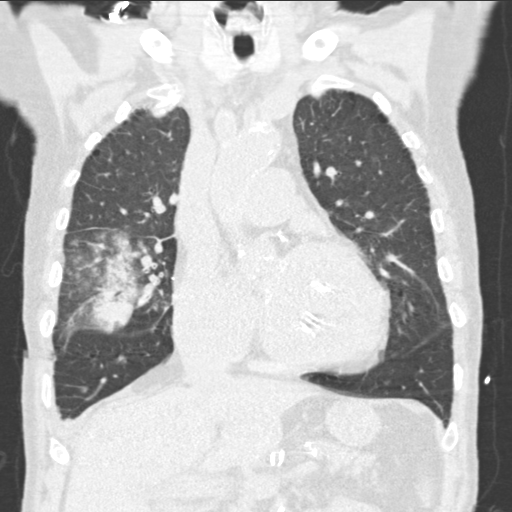
[im 90/150  lung]
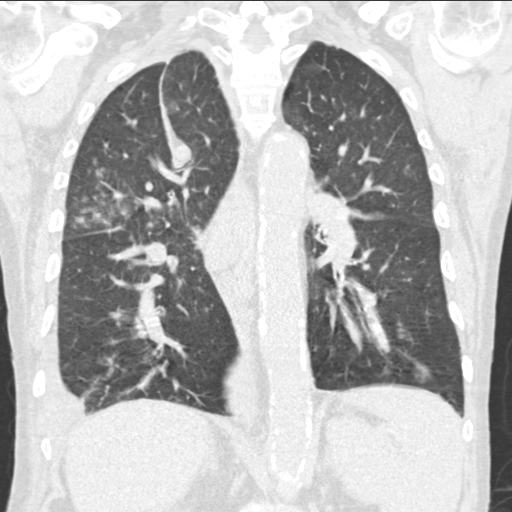

[15 of 36 positions shown; findings below may reference images not displayed]

FINDINGS: Cardiovascular: Moderate thoracic aortic atherosclerosis is present.
The pulmonary arteries also are very prominent most likely
indicating a degree of pulmonary arterial hypertension. Diffuse
coronary artery calcifications are noted and median sternotomy
sutures are present. No pericardial effusion is seen.

Mediastinum/Nodes: There are prominent mediastinal lymph nodes. A
pretracheal node on image 60 series 2 measures 16 mm in diameter.
And additional pretracheal node on image 61 measures 12 mm in
diameter. A prevascular node on image 58 measures 15 mm. A
subcarinal node on image 85 measures 15 mm in diameter. These nodes
may well be postinflammatory or infectious, with neoplasm remaining
a possibility. The thyroid gland is unremarkable. No hiatal hernia
is seen.

Lungs/Pleura: On lung window images, there is a moderately large
right pleural effusion present extending into the fissure. However,
there is also patchy airspace disease within the right upper lobe
and right middle lobe most consistent with multifocal pneumonia. The
left lung appears clear. There may be a degree of interstitial edema
present. The central airway appears patent.

Upper Abdomen: Within the upper abdomen, no significant abnormality
is seen. There may be debris layering within the gallbladder.
Significant splenic artery calcifications are present.

Musculoskeletal: The thoracic vertebrae are in normal alignment with
diffuse degenerative change present.
IMPRESSION: 1. Parenchymal opacity within the right upper lobe and right middle
lobe consistent with multifocal pneumonia.
2. Moderate size right pleural effusion some of which tracks into
the fissures.
3. Moderate thoracic aortic atherosclerosis
